# Patient Record
Sex: Female | Born: 1935 | Race: White | Hispanic: No | Marital: Married | Smoking: Never smoker
Health system: Southern US, Community
[De-identification: ages and names within clinical notes are randomized; demographics above are authoritative.]

## PROBLEM LIST (undated history)

## (undated) DIAGNOSIS — Z86010 Personal history of colonic polyps: Secondary | ICD-10-CM

## (undated) DIAGNOSIS — J069 Acute upper respiratory infection, unspecified: Secondary | ICD-10-CM

## (undated) DIAGNOSIS — Z862 Personal history of diseases of the blood and blood-forming organs and certain disorders involving the immune mechanism: Secondary | ICD-10-CM

## (undated) DIAGNOSIS — Z8673 Personal history of transient ischemic attack (TIA), and cerebral infarction without residual deficits: Secondary | ICD-10-CM

## (undated) DIAGNOSIS — K8020 Calculus of gallbladder without cholecystitis without obstruction: Secondary | ICD-10-CM

## (undated) DIAGNOSIS — C180 Malignant neoplasm of cecum: Secondary | ICD-10-CM

## (undated) DIAGNOSIS — H3410 Central retinal artery occlusion, unspecified eye: Secondary | ICD-10-CM

## (undated) DIAGNOSIS — M549 Dorsalgia, unspecified: Secondary | ICD-10-CM

## (undated) DIAGNOSIS — Z8639 Personal history of other endocrine, nutritional and metabolic disease: Secondary | ICD-10-CM

## (undated) DIAGNOSIS — I10 Essential (primary) hypertension: Secondary | ICD-10-CM

## (undated) DIAGNOSIS — D649 Anemia, unspecified: Secondary | ICD-10-CM

## (undated) DIAGNOSIS — Z8719 Personal history of other diseases of the digestive system: Secondary | ICD-10-CM

## (undated) DIAGNOSIS — Z8669 Personal history of other diseases of the nervous system and sense organs: Secondary | ICD-10-CM

## (undated) DIAGNOSIS — H409 Unspecified glaucoma: Secondary | ICD-10-CM

## (undated) DIAGNOSIS — Z85038 Personal history of other malignant neoplasm of large intestine: Secondary | ICD-10-CM

## (undated) DIAGNOSIS — K5730 Diverticulosis of large intestine without perforation or abscess without bleeding: Secondary | ICD-10-CM

## (undated) DIAGNOSIS — I359 Nonrheumatic aortic valve disorder, unspecified: Secondary | ICD-10-CM

## (undated) HISTORY — DX: Central retinal artery occlusion, unspecified eye: H34.10

## (undated) HISTORY — DX: Essential (primary) hypertension: I10

## (undated) HISTORY — DX: Personal history of other diseases of the nervous system and sense organs: Z86.69

## (undated) HISTORY — PX: APPENDECTOMY: SHX54

## (undated) HISTORY — DX: Personal history of colonic polyps: Z86.010

## (undated) HISTORY — DX: Personal history of other endocrine, nutritional and metabolic disease: Z86.39

## (undated) HISTORY — PX: TONSILLECTOMY: SHX5217

## (undated) HISTORY — PX: DILATION AND CURETTAGE OF UTERUS: SHX78

## (undated) HISTORY — PX: CATARACT EXTRACTION: SUR2

## (undated) HISTORY — PX: COLON SURGERY: SHX602

## (undated) HISTORY — DX: Personal history of other malignant neoplasm of large intestine: Z85.038

## (undated) HISTORY — DX: Unspecified glaucoma: H40.9

## (undated) HISTORY — DX: Dorsalgia, unspecified: M54.9

## (undated) HISTORY — DX: Diverticulosis of large intestine without perforation or abscess without bleeding: K57.30

## (undated) HISTORY — DX: Personal history of transient ischemic attack (TIA), and cerebral infarction without residual deficits: Z86.73

## (undated) HISTORY — DX: Personal history of other diseases of the digestive system: Z87.19

## (undated) HISTORY — DX: Anemia, unspecified: D64.9

## (undated) HISTORY — DX: Calculus of gallbladder without cholecystitis without obstruction: K80.20

## (undated) HISTORY — DX: Personal history of diseases of the blood and blood-forming organs and certain disorders involving the immune mechanism: Z86.2

## (undated) HISTORY — DX: Nonrheumatic aortic valve disorder, unspecified: I35.9

## (undated) HISTORY — DX: Acute upper respiratory infection, unspecified: J06.9

## (undated) HISTORY — DX: Malignant neoplasm of cecum: C18.0

---

## 1999-06-11 ENCOUNTER — Emergency Department (HOSPITAL_COMMUNITY): Admission: EM | Admit: 1999-06-11 | Discharge: 1999-06-11 | Payer: Self-pay | Admitting: Emergency Medicine

## 1999-12-22 ENCOUNTER — Other Ambulatory Visit: Admission: RE | Admit: 1999-12-22 | Discharge: 1999-12-22 | Payer: Self-pay | Admitting: Internal Medicine

## 1999-12-22 ENCOUNTER — Encounter (INDEPENDENT_AMBULATORY_CARE_PROVIDER_SITE_OTHER): Payer: Self-pay | Admitting: Specialist

## 2001-04-24 ENCOUNTER — Other Ambulatory Visit: Admission: RE | Admit: 2001-04-24 | Discharge: 2001-04-24 | Payer: Self-pay | Admitting: Internal Medicine

## 2004-04-26 ENCOUNTER — Ambulatory Visit: Payer: Self-pay | Admitting: Internal Medicine

## 2004-07-28 ENCOUNTER — Ambulatory Visit: Payer: Self-pay | Admitting: Internal Medicine

## 2004-11-07 ENCOUNTER — Ambulatory Visit: Payer: Self-pay | Admitting: Internal Medicine

## 2004-11-30 ENCOUNTER — Ambulatory Visit: Payer: Self-pay | Admitting: Internal Medicine

## 2005-01-31 ENCOUNTER — Ambulatory Visit: Payer: Self-pay | Admitting: Internal Medicine

## 2005-08-02 ENCOUNTER — Ambulatory Visit: Payer: Self-pay | Admitting: Internal Medicine

## 2006-01-04 ENCOUNTER — Ambulatory Visit: Payer: Self-pay | Admitting: Internal Medicine

## 2006-02-05 ENCOUNTER — Ambulatory Visit: Payer: Self-pay | Admitting: Internal Medicine

## 2006-07-09 ENCOUNTER — Inpatient Hospital Stay (HOSPITAL_COMMUNITY): Admission: EM | Admit: 2006-07-09 | Discharge: 2006-07-10 | Payer: Self-pay | Admitting: Emergency Medicine

## 2006-07-09 ENCOUNTER — Encounter (INDEPENDENT_AMBULATORY_CARE_PROVIDER_SITE_OTHER): Payer: Self-pay | Admitting: *Deleted

## 2006-07-09 ENCOUNTER — Encounter (INDEPENDENT_AMBULATORY_CARE_PROVIDER_SITE_OTHER): Payer: Self-pay | Admitting: Specialist

## 2006-07-10 ENCOUNTER — Encounter (INDEPENDENT_AMBULATORY_CARE_PROVIDER_SITE_OTHER): Payer: Self-pay | Admitting: *Deleted

## 2006-08-07 ENCOUNTER — Ambulatory Visit: Payer: Self-pay | Admitting: Internal Medicine

## 2006-08-14 ENCOUNTER — Encounter: Payer: Self-pay | Admitting: Internal Medicine

## 2006-08-14 DIAGNOSIS — K5730 Diverticulosis of large intestine without perforation or abscess without bleeding: Secondary | ICD-10-CM | POA: Insufficient documentation

## 2006-08-14 DIAGNOSIS — H3410 Central retinal artery occlusion, unspecified eye: Secondary | ICD-10-CM

## 2006-08-14 DIAGNOSIS — H409 Unspecified glaucoma: Secondary | ICD-10-CM

## 2006-08-14 DIAGNOSIS — Z8669 Personal history of other diseases of the nervous system and sense organs: Secondary | ICD-10-CM

## 2006-08-14 DIAGNOSIS — I10 Essential (primary) hypertension: Secondary | ICD-10-CM

## 2006-08-14 HISTORY — DX: Central retinal artery occlusion, unspecified eye: H34.10

## 2006-08-14 HISTORY — DX: Essential (primary) hypertension: I10

## 2006-08-14 HISTORY — DX: Personal history of other diseases of the nervous system and sense organs: Z86.69

## 2006-08-14 HISTORY — DX: Unspecified glaucoma: H40.9

## 2006-08-14 HISTORY — DX: Diverticulosis of large intestine without perforation or abscess without bleeding: K57.30

## 2006-11-22 ENCOUNTER — Encounter: Payer: Self-pay | Admitting: Internal Medicine

## 2007-01-09 ENCOUNTER — Ambulatory Visit: Payer: Self-pay | Admitting: Internal Medicine

## 2007-02-05 ENCOUNTER — Ambulatory Visit: Payer: Self-pay | Admitting: Internal Medicine

## 2007-04-05 ENCOUNTER — Encounter: Payer: Self-pay | Admitting: Internal Medicine

## 2007-06-27 ENCOUNTER — Ambulatory Visit: Payer: Self-pay | Admitting: Internal Medicine

## 2007-06-27 DIAGNOSIS — M549 Dorsalgia, unspecified: Secondary | ICD-10-CM

## 2007-06-27 HISTORY — DX: Dorsalgia, unspecified: M54.9

## 2007-09-26 ENCOUNTER — Encounter: Payer: Self-pay | Admitting: Internal Medicine

## 2007-10-30 ENCOUNTER — Encounter: Payer: Self-pay | Admitting: Internal Medicine

## 2007-10-31 ENCOUNTER — Ambulatory Visit: Payer: Self-pay | Admitting: Internal Medicine

## 2007-10-31 LAB — CONVERTED CEMR LAB
ALT: 13 units/L (ref 0–35)
AST: 23 units/L (ref 0–37)
Basophils Absolute: 0 10*3/uL (ref 0.0–0.1)
Basophils Relative: 0.2 % (ref 0.0–3.0)
Bilirubin, Direct: 0.1 mg/dL (ref 0.0–0.3)
CO2: 31 meq/L (ref 19–32)
Chloride: 104 meq/L (ref 96–112)
Cholesterol: 171 mg/dL (ref 0–200)
Creatinine, Ser: 0.9 mg/dL (ref 0.4–1.2)
Glucose, Bld: 108 mg/dL — ABNORMAL HIGH (ref 70–99)
LDL Cholesterol: 112 mg/dL — ABNORMAL HIGH (ref 0–99)
Lymphocytes Relative: 22.8 % (ref 12.0–46.0)
MCHC: 34.3 g/dL (ref 30.0–36.0)
Monocytes Relative: 10 % (ref 3.0–12.0)
Neutrophils Relative %: 65.5 % (ref 43.0–77.0)
RBC: 4.81 M/uL (ref 3.87–5.11)
Sodium: 141 meq/L (ref 135–145)
Total Bilirubin: 0.9 mg/dL (ref 0.3–1.2)
Total CHOL/HDL Ratio: 5.7
VLDL: 29 mg/dL (ref 0–40)

## 2007-12-30 ENCOUNTER — Ambulatory Visit: Payer: Self-pay | Admitting: Internal Medicine

## 2008-05-18 ENCOUNTER — Ambulatory Visit: Payer: Self-pay | Admitting: Internal Medicine

## 2008-07-11 ENCOUNTER — Telehealth: Payer: Self-pay | Admitting: Family Medicine

## 2008-07-11 ENCOUNTER — Ambulatory Visit: Payer: Self-pay | Admitting: Family Medicine

## 2008-08-19 ENCOUNTER — Ambulatory Visit: Payer: Self-pay | Admitting: Internal Medicine

## 2008-08-19 DIAGNOSIS — R109 Unspecified abdominal pain: Secondary | ICD-10-CM | POA: Insufficient documentation

## 2008-08-28 ENCOUNTER — Ambulatory Visit: Payer: Self-pay | Admitting: Internal Medicine

## 2008-08-28 ENCOUNTER — Ambulatory Visit (HOSPITAL_COMMUNITY): Admission: RE | Admit: 2008-08-28 | Discharge: 2008-08-28 | Payer: Self-pay | Admitting: Internal Medicine

## 2008-08-28 LAB — CONVERTED CEMR LAB
Basophils Absolute: 0 10*3/uL (ref 0.0–0.1)
Calcium: 9.2 mg/dL (ref 8.4–10.5)
Eosinophils Absolute: 0.2 10*3/uL (ref 0.0–0.7)
GFR calc non Af Amer: 72.65 mL/min (ref >60)
Glucose, Bld: 108 mg/dL — ABNORMAL HIGH (ref 70–99)
HCT: 36.8 % (ref 36.0–46.0)
Lymphs Abs: 2.7 10*3/uL (ref 0.7–4.0)
MCV: 87 fL (ref 78.0–100.0)
Monocytes Absolute: 1 10*3/uL (ref 0.1–1.0)
Platelets: 298 10*3/uL (ref 150.0–400.0)
RDW: 13.1 % (ref 11.5–14.6)
Sodium: 142 meq/L (ref 135–145)

## 2008-08-29 ENCOUNTER — Telehealth: Payer: Self-pay | Admitting: Internal Medicine

## 2008-08-31 ENCOUNTER — Telehealth: Payer: Self-pay | Admitting: Internal Medicine

## 2008-09-07 ENCOUNTER — Ambulatory Visit: Payer: Self-pay | Admitting: Internal Medicine

## 2008-09-07 DIAGNOSIS — J069 Acute upper respiratory infection, unspecified: Secondary | ICD-10-CM

## 2008-09-07 HISTORY — DX: Acute upper respiratory infection, unspecified: J06.9

## 2008-10-06 ENCOUNTER — Encounter: Payer: Self-pay | Admitting: Internal Medicine

## 2008-10-07 ENCOUNTER — Encounter: Admission: RE | Admit: 2008-10-07 | Discharge: 2008-10-07 | Payer: Self-pay | Admitting: Internal Medicine

## 2008-10-08 ENCOUNTER — Telehealth: Payer: Self-pay | Admitting: Internal Medicine

## 2008-10-09 ENCOUNTER — Telehealth: Payer: Self-pay | Admitting: Internal Medicine

## 2008-10-09 DIAGNOSIS — K8020 Calculus of gallbladder without cholecystitis without obstruction: Secondary | ICD-10-CM | POA: Insufficient documentation

## 2008-10-09 HISTORY — DX: Calculus of gallbladder without cholecystitis without obstruction: K80.20

## 2008-10-12 ENCOUNTER — Ambulatory Visit: Payer: Self-pay | Admitting: Internal Medicine

## 2008-10-12 ENCOUNTER — Telehealth (INDEPENDENT_AMBULATORY_CARE_PROVIDER_SITE_OTHER): Payer: Self-pay | Admitting: *Deleted

## 2008-10-12 DIAGNOSIS — Z8719 Personal history of other diseases of the digestive system: Secondary | ICD-10-CM

## 2008-10-12 HISTORY — DX: Personal history of other diseases of the digestive system: Z87.19

## 2008-10-15 ENCOUNTER — Telehealth: Payer: Self-pay | Admitting: Internal Medicine

## 2008-10-15 ENCOUNTER — Telehealth (INDEPENDENT_AMBULATORY_CARE_PROVIDER_SITE_OTHER): Payer: Self-pay

## 2008-10-15 LAB — CONVERTED CEMR LAB
Basophils Absolute: 0 10*3/uL (ref 0.0–0.1)
Hemoglobin: 11.2 g/dL — ABNORMAL LOW (ref 12.0–15.0)
Lymphocytes Relative: 17.6 % (ref 12.0–46.0)
Monocytes Relative: 9.1 % (ref 3.0–12.0)
Neutro Abs: 8.4 10*3/uL — ABNORMAL HIGH (ref 1.4–7.7)
RBC: 3.96 M/uL (ref 3.87–5.11)
RDW: 13 % (ref 11.5–14.6)

## 2008-10-19 ENCOUNTER — Ambulatory Visit: Payer: Self-pay | Admitting: Internal Medicine

## 2008-10-19 LAB — CONVERTED CEMR LAB
Basophils Absolute: 0 10*3/uL (ref 0.0–0.1)
Basophils Relative: 0.3 % (ref 0.0–3.0)
Eosinophils Relative: 0.9 % (ref 0.0–5.0)
HCT: 32.3 % — ABNORMAL LOW (ref 36.0–46.0)
Hemoglobin: 10.7 g/dL — ABNORMAL LOW (ref 12.0–15.0)
Lymphs Abs: 1.7 10*3/uL (ref 0.7–4.0)
Monocytes Relative: 9.6 % (ref 3.0–12.0)
Neutro Abs: 8 10*3/uL — ABNORMAL HIGH (ref 1.4–7.7)
RDW: 13.6 % (ref 11.5–14.6)

## 2008-10-21 ENCOUNTER — Ambulatory Visit: Payer: Self-pay | Admitting: Gastroenterology

## 2008-10-21 DIAGNOSIS — D649 Anemia, unspecified: Secondary | ICD-10-CM

## 2008-10-21 DIAGNOSIS — K921 Melena: Secondary | ICD-10-CM

## 2008-10-21 DIAGNOSIS — R1013 Epigastric pain: Secondary | ICD-10-CM

## 2008-10-21 DIAGNOSIS — Z860100 Personal history of colon polyps, unspecified: Secondary | ICD-10-CM | POA: Insufficient documentation

## 2008-10-21 HISTORY — DX: Personal history of colonic polyps: Z86.010

## 2008-10-21 HISTORY — DX: Personal history of colon polyps, unspecified: Z86.0100

## 2008-10-21 HISTORY — DX: Anemia, unspecified: D64.9

## 2008-10-23 LAB — CONVERTED CEMR LAB
BUN: 14 mg/dL (ref 6–23)
CO2: 31 meq/L (ref 19–32)
Creatinine, Ser: 0.7 mg/dL (ref 0.4–1.2)
Eosinophils Relative: 1.2 % (ref 0.0–5.0)
GFR calc non Af Amer: 84.72 mL/min (ref >60)
Glucose, Bld: 100 mg/dL — ABNORMAL HIGH (ref 70–99)
HCT: 32 % — ABNORMAL LOW (ref 36.0–46.0)
Hemoglobin: 10.8 g/dL — ABNORMAL LOW (ref 12.0–15.0)
Lipase: 13 units/L (ref 11.0–59.0)
Lymphocytes Relative: 17 % (ref 12.0–46.0)
Lymphs Abs: 2.1 10*3/uL (ref 0.7–4.0)
Monocytes Relative: 9.1 % (ref 3.0–12.0)
Neutro Abs: 8.7 10*3/uL — ABNORMAL HIGH (ref 1.4–7.7)
RBC: 3.85 M/uL — ABNORMAL LOW (ref 3.87–5.11)
Total Bilirubin: 0.4 mg/dL (ref 0.3–1.2)
WBC: 12.1 10*3/uL — ABNORMAL HIGH (ref 4.5–10.5)

## 2008-10-27 ENCOUNTER — Encounter: Payer: Self-pay | Admitting: Internal Medicine

## 2008-10-27 ENCOUNTER — Ambulatory Visit (HOSPITAL_COMMUNITY): Admission: RE | Admit: 2008-10-27 | Discharge: 2008-10-27 | Payer: Self-pay | Admitting: Internal Medicine

## 2008-10-27 ENCOUNTER — Telehealth: Payer: Self-pay | Admitting: Internal Medicine

## 2008-10-28 ENCOUNTER — Ambulatory Visit: Payer: Self-pay | Admitting: Internal Medicine

## 2008-10-30 ENCOUNTER — Encounter: Payer: Self-pay | Admitting: Internal Medicine

## 2008-10-30 ENCOUNTER — Telehealth: Payer: Self-pay | Admitting: Internal Medicine

## 2008-10-30 ENCOUNTER — Ambulatory Visit: Payer: Self-pay | Admitting: Internal Medicine

## 2008-10-30 ENCOUNTER — Ambulatory Visit (HOSPITAL_COMMUNITY): Admission: RE | Admit: 2008-10-30 | Discharge: 2008-10-30 | Payer: Self-pay | Admitting: Internal Medicine

## 2008-10-30 DIAGNOSIS — C180 Malignant neoplasm of cecum: Secondary | ICD-10-CM

## 2008-10-30 HISTORY — DX: Malignant neoplasm of cecum: C18.0

## 2008-11-02 ENCOUNTER — Ambulatory Visit (HOSPITAL_COMMUNITY): Admission: RE | Admit: 2008-11-02 | Discharge: 2008-11-02 | Payer: Self-pay | Admitting: Internal Medicine

## 2008-11-02 ENCOUNTER — Encounter: Payer: Self-pay | Admitting: Internal Medicine

## 2008-11-02 ENCOUNTER — Telehealth: Payer: Self-pay | Admitting: Internal Medicine

## 2008-11-03 ENCOUNTER — Ambulatory Visit: Payer: Self-pay | Admitting: Internal Medicine

## 2008-11-10 ENCOUNTER — Encounter: Payer: Self-pay | Admitting: Internal Medicine

## 2008-11-20 ENCOUNTER — Encounter (INDEPENDENT_AMBULATORY_CARE_PROVIDER_SITE_OTHER): Payer: Self-pay | Admitting: Surgery

## 2008-11-20 ENCOUNTER — Inpatient Hospital Stay (HOSPITAL_COMMUNITY): Admission: RE | Admit: 2008-11-20 | Discharge: 2008-11-26 | Payer: Self-pay | Admitting: Surgery

## 2008-12-04 ENCOUNTER — Ambulatory Visit: Payer: Self-pay | Admitting: Oncology

## 2008-12-09 ENCOUNTER — Encounter: Payer: Self-pay | Admitting: Internal Medicine

## 2008-12-09 LAB — CBC WITH DIFFERENTIAL/PLATELET
BASO%: 0.4 % (ref 0.0–2.0)
EOS%: 1 % (ref 0.0–7.0)
HCT: 30.5 % — ABNORMAL LOW (ref 34.8–46.6)
LYMPH%: 23.2 % (ref 14.0–49.7)
MCH: 26 pg (ref 25.1–34.0)
MCHC: 33 g/dL (ref 31.5–36.0)
MCV: 78.8 fL — ABNORMAL LOW (ref 79.5–101.0)
MONO%: 8.6 % (ref 0.0–14.0)
NEUT%: 66.8 % (ref 38.4–76.8)
Platelets: 355 10*3/uL (ref 145–400)

## 2008-12-09 LAB — COMPREHENSIVE METABOLIC PANEL
ALT: 15 U/L (ref 0–35)
AST: 23 U/L (ref 0–37)
BUN: 15 mg/dL (ref 6–23)
Creatinine, Ser: 0.64 mg/dL (ref 0.40–1.20)
Total Bilirubin: 0.2 mg/dL — ABNORMAL LOW (ref 0.3–1.2)

## 2008-12-22 ENCOUNTER — Inpatient Hospital Stay (HOSPITAL_COMMUNITY): Admission: EM | Admit: 2008-12-22 | Discharge: 2008-12-27 | Payer: Self-pay | Admitting: Emergency Medicine

## 2008-12-22 ENCOUNTER — Telehealth: Payer: Self-pay | Admitting: Internal Medicine

## 2008-12-22 ENCOUNTER — Encounter (INDEPENDENT_AMBULATORY_CARE_PROVIDER_SITE_OTHER): Payer: Self-pay | Admitting: *Deleted

## 2008-12-23 ENCOUNTER — Encounter (INDEPENDENT_AMBULATORY_CARE_PROVIDER_SITE_OTHER): Payer: Self-pay | Admitting: *Deleted

## 2008-12-24 ENCOUNTER — Encounter (INDEPENDENT_AMBULATORY_CARE_PROVIDER_SITE_OTHER): Payer: Self-pay | Admitting: *Deleted

## 2009-01-04 ENCOUNTER — Ambulatory Visit: Payer: Self-pay | Admitting: Internal Medicine

## 2009-01-04 DIAGNOSIS — T887XXA Unspecified adverse effect of drug or medicament, initial encounter: Secondary | ICD-10-CM

## 2009-03-08 ENCOUNTER — Ambulatory Visit: Payer: Self-pay | Admitting: Oncology

## 2009-03-10 ENCOUNTER — Encounter: Payer: Self-pay | Admitting: Internal Medicine

## 2009-03-10 LAB — CBC WITH DIFFERENTIAL/PLATELET
BASO%: 0.3 % (ref 0.0–2.0)
LYMPH%: 23.4 % (ref 14.0–49.7)
MCHC: 33.1 g/dL (ref 31.5–36.0)
MONO#: 0.8 10*3/uL (ref 0.1–0.9)
Platelets: 263 10*3/uL (ref 145–400)
RBC: 4.59 10*6/uL (ref 3.70–5.45)
WBC: 7.3 10*3/uL (ref 3.9–10.3)

## 2009-03-10 LAB — COMPREHENSIVE METABOLIC PANEL
ALT: 10 U/L (ref 0–35)
AST: 17 U/L (ref 0–37)
Alkaline Phosphatase: 63 U/L (ref 39–117)
CO2: 27 mEq/L (ref 19–32)
Sodium: 137 mEq/L (ref 135–145)
Total Bilirubin: 0.3 mg/dL (ref 0.3–1.2)
Total Protein: 6.9 g/dL (ref 6.0–8.3)

## 2009-03-10 LAB — CEA: CEA: 3.2 ng/mL (ref 0.0–5.0)

## 2009-03-27 HISTORY — PX: ANKLE FRACTURE SURGERY: SHX122

## 2009-03-31 ENCOUNTER — Ambulatory Visit: Payer: Self-pay | Admitting: Internal Medicine

## 2009-05-07 ENCOUNTER — Encounter: Payer: Self-pay | Admitting: Internal Medicine

## 2009-05-08 ENCOUNTER — Emergency Department (HOSPITAL_COMMUNITY): Admission: EM | Admit: 2009-05-08 | Discharge: 2009-05-08 | Payer: Self-pay | Admitting: Family Medicine

## 2009-06-10 ENCOUNTER — Ambulatory Visit: Payer: Self-pay | Admitting: Oncology

## 2009-06-14 ENCOUNTER — Ambulatory Visit (HOSPITAL_COMMUNITY): Admission: RE | Admit: 2009-06-14 | Discharge: 2009-06-14 | Payer: Self-pay | Admitting: Oncology

## 2009-06-14 ENCOUNTER — Encounter (INDEPENDENT_AMBULATORY_CARE_PROVIDER_SITE_OTHER): Payer: Self-pay | Admitting: *Deleted

## 2009-06-14 LAB — COMPREHENSIVE METABOLIC PANEL
ALT: 13 U/L (ref 0–35)
Alkaline Phosphatase: 66 U/L (ref 39–117)
CO2: 32 mEq/L (ref 19–32)
Sodium: 139 mEq/L (ref 135–145)
Total Bilirubin: 0.6 mg/dL (ref 0.3–1.2)
Total Protein: 7.8 g/dL (ref 6.0–8.3)

## 2009-06-14 LAB — CBC WITH DIFFERENTIAL/PLATELET
BASO%: 0.4 % (ref 0.0–2.0)
LYMPH%: 20.2 % (ref 14.0–49.7)
MCHC: 34 g/dL (ref 31.5–36.0)
MONO#: 0.8 10*3/uL (ref 0.1–0.9)
Platelets: 259 10*3/uL (ref 145–400)
RBC: 4.73 10*6/uL (ref 3.70–5.45)
RDW: 15.1 % — ABNORMAL HIGH (ref 11.2–14.5)
WBC: 10.5 10*3/uL — ABNORMAL HIGH (ref 3.9–10.3)

## 2009-06-14 LAB — CEA: CEA: 4.5 ng/mL (ref 0.0–5.0)

## 2009-06-18 ENCOUNTER — Encounter: Payer: Self-pay | Admitting: Internal Medicine

## 2009-06-23 ENCOUNTER — Encounter: Payer: Self-pay | Admitting: Internal Medicine

## 2009-06-25 ENCOUNTER — Ambulatory Visit (HOSPITAL_COMMUNITY): Admission: RE | Admit: 2009-06-25 | Discharge: 2009-06-25 | Payer: Self-pay | Admitting: Oncology

## 2009-06-25 ENCOUNTER — Encounter (INDEPENDENT_AMBULATORY_CARE_PROVIDER_SITE_OTHER): Payer: Self-pay | Admitting: *Deleted

## 2009-07-28 ENCOUNTER — Ambulatory Visit: Payer: Self-pay | Admitting: Internal Medicine

## 2009-09-20 ENCOUNTER — Ambulatory Visit: Payer: Self-pay | Admitting: Oncology

## 2009-09-21 ENCOUNTER — Encounter: Payer: Self-pay | Admitting: Internal Medicine

## 2009-09-21 LAB — CBC WITH DIFFERENTIAL/PLATELET
Basophils Absolute: 0 10*3/uL (ref 0.0–0.1)
Eosinophils Absolute: 0.2 10*3/uL (ref 0.0–0.5)
HCT: 39.5 % (ref 34.8–46.6)
HGB: 13.4 g/dL (ref 11.6–15.9)
MONO#: 0.9 10*3/uL (ref 0.1–0.9)
NEUT#: 5.6 10*3/uL (ref 1.5–6.5)
NEUT%: 69.2 % (ref 38.4–76.8)
RDW: 14.4 % (ref 11.2–14.5)
lymph#: 1.4 10*3/uL (ref 0.9–3.3)

## 2009-09-21 LAB — COMPREHENSIVE METABOLIC PANEL
AST: 20 U/L (ref 0–37)
Albumin: 3.9 g/dL (ref 3.5–5.2)
BUN: 18 mg/dL (ref 6–23)
CO2: 29 mEq/L (ref 19–32)
Calcium: 9.2 mg/dL (ref 8.4–10.5)
Chloride: 101 mEq/L (ref 96–112)
Glucose, Bld: 98 mg/dL (ref 70–99)
Potassium: 4 mEq/L (ref 3.5–5.3)

## 2009-09-21 LAB — CEA: CEA: 3.7 ng/mL (ref 0.0–5.0)

## 2009-09-21 LAB — LACTATE DEHYDROGENASE: LDH: 94 U/L (ref 94–250)

## 2009-11-07 ENCOUNTER — Inpatient Hospital Stay (HOSPITAL_COMMUNITY): Admission: EM | Admit: 2009-11-07 | Discharge: 2009-11-12 | Payer: Self-pay | Admitting: Emergency Medicine

## 2009-11-09 ENCOUNTER — Encounter (INDEPENDENT_AMBULATORY_CARE_PROVIDER_SITE_OTHER): Payer: Self-pay | Admitting: Family Medicine

## 2009-11-11 ENCOUNTER — Ambulatory Visit: Payer: Self-pay | Admitting: Vascular Surgery

## 2009-11-11 ENCOUNTER — Encounter (INDEPENDENT_AMBULATORY_CARE_PROVIDER_SITE_OTHER): Payer: Self-pay | Admitting: Internal Medicine

## 2009-11-15 ENCOUNTER — Telehealth: Payer: Self-pay | Admitting: Internal Medicine

## 2009-11-24 ENCOUNTER — Telehealth (INDEPENDENT_AMBULATORY_CARE_PROVIDER_SITE_OTHER): Payer: Self-pay | Admitting: *Deleted

## 2009-11-25 ENCOUNTER — Telehealth: Payer: Self-pay | Admitting: Internal Medicine

## 2009-11-25 ENCOUNTER — Ambulatory Visit: Payer: Self-pay | Admitting: Internal Medicine

## 2009-11-26 ENCOUNTER — Ambulatory Visit: Payer: Self-pay | Admitting: Internal Medicine

## 2009-11-26 DIAGNOSIS — Z862 Personal history of diseases of the blood and blood-forming organs and certain disorders involving the immune mechanism: Secondary | ICD-10-CM

## 2009-11-26 DIAGNOSIS — I359 Nonrheumatic aortic valve disorder, unspecified: Secondary | ICD-10-CM

## 2009-11-26 DIAGNOSIS — Z8639 Personal history of other endocrine, nutritional and metabolic disease: Secondary | ICD-10-CM

## 2009-11-26 HISTORY — DX: Nonrheumatic aortic valve disorder, unspecified: I35.9

## 2009-11-26 HISTORY — DX: Personal history of diseases of the blood and blood-forming organs and certain disorders involving the immune mechanism: Z86.2

## 2009-11-26 HISTORY — DX: Personal history of diseases of the blood and blood-forming organs and certain disorders involving the immune mechanism: Z86.39

## 2009-12-06 ENCOUNTER — Encounter: Payer: Self-pay | Admitting: Internal Medicine

## 2009-12-09 ENCOUNTER — Telehealth: Payer: Self-pay | Admitting: Internal Medicine

## 2009-12-16 ENCOUNTER — Ambulatory Visit: Payer: Self-pay | Admitting: Oncology

## 2009-12-20 ENCOUNTER — Encounter (INDEPENDENT_AMBULATORY_CARE_PROVIDER_SITE_OTHER): Payer: Self-pay | Admitting: *Deleted

## 2009-12-20 ENCOUNTER — Ambulatory Visit (HOSPITAL_COMMUNITY): Admission: RE | Admit: 2009-12-20 | Discharge: 2009-12-20 | Payer: Self-pay | Admitting: Oncology

## 2009-12-20 LAB — CBC WITH DIFFERENTIAL/PLATELET
BASO%: 0.3 % (ref 0.0–2.0)
Eosinophils Absolute: 0.1 10*3/uL (ref 0.0–0.5)
HCT: 40 % (ref 34.8–46.6)
MCHC: 33.9 g/dL (ref 31.5–36.0)
MONO#: 0.5 10*3/uL (ref 0.1–0.9)
NEUT#: 7 10*3/uL — ABNORMAL HIGH (ref 1.5–6.5)
Platelets: 269 10*3/uL (ref 145–400)
RBC: 4.56 10*6/uL (ref 3.70–5.45)
WBC: 9.3 10*3/uL (ref 3.9–10.3)
lymph#: 1.7 10*3/uL (ref 0.9–3.3)

## 2009-12-20 LAB — CEA: CEA: 3.2 ng/mL (ref 0.0–5.0)

## 2009-12-22 ENCOUNTER — Encounter: Payer: Self-pay | Admitting: Internal Medicine

## 2010-01-06 ENCOUNTER — Telehealth: Payer: Self-pay | Admitting: Internal Medicine

## 2010-01-21 ENCOUNTER — Ambulatory Visit: Payer: Self-pay | Admitting: Internal Medicine

## 2010-01-25 ENCOUNTER — Encounter: Payer: Self-pay | Admitting: Internal Medicine

## 2010-01-31 ENCOUNTER — Encounter: Payer: Self-pay | Admitting: Internal Medicine

## 2010-02-16 ENCOUNTER — Telehealth: Payer: Self-pay | Admitting: Internal Medicine

## 2010-02-25 DIAGNOSIS — Z85038 Personal history of other malignant neoplasm of large intestine: Secondary | ICD-10-CM

## 2010-02-25 DIAGNOSIS — K56609 Unspecified intestinal obstruction, unspecified as to partial versus complete obstruction: Secondary | ICD-10-CM | POA: Insufficient documentation

## 2010-02-25 HISTORY — DX: Personal history of other malignant neoplasm of large intestine: Z85.038

## 2010-03-03 ENCOUNTER — Ambulatory Visit: Payer: Self-pay | Admitting: Internal Medicine

## 2010-03-30 ENCOUNTER — Ambulatory Visit
Admission: RE | Admit: 2010-03-30 | Discharge: 2010-03-30 | Payer: Self-pay | Source: Home / Self Care | Attending: Internal Medicine | Admitting: Internal Medicine

## 2010-03-30 ENCOUNTER — Encounter: Payer: Self-pay | Admitting: Internal Medicine

## 2010-03-30 LAB — HM COLONOSCOPY

## 2010-04-01 ENCOUNTER — Ambulatory Visit
Admission: RE | Admit: 2010-04-01 | Discharge: 2010-04-01 | Payer: Self-pay | Source: Home / Self Care | Attending: Internal Medicine | Admitting: Internal Medicine

## 2010-04-02 ENCOUNTER — Encounter: Payer: Self-pay | Admitting: Internal Medicine

## 2010-04-17 ENCOUNTER — Encounter: Payer: Self-pay | Admitting: Oncology

## 2010-04-26 NOTE — Progress Notes (Signed)
Summary: Verbal Order  Phone Note Other Incoming Call back at [phone]   Caller: [facility]  Summary of Call: Needs verbal order to treat patient for an additional week before discharging. Initial call taken by: Trixie Dredge,  January 06, 2010 9:21 AM  Follow-up for Phone Call        okay Follow-up by: Gordy Savers  MD,  January 06, 2010 12:27 PM  Additional Follow-up for Phone Call Additional follow up Details #1::        spoke with Trey Paula - okay'd ordered. KIK Additional Follow-up by: Duard Brady LPN,  January 06, 2010 12:50 PM

## 2010-04-26 NOTE — Miscellaneous (Signed)
Summary: Face to Face Visit/[facility]  Face to Face Visit/[facility]   Imported By: Maryln Gottron 12/10/2009 15:10:31  _____________________________________________________________________  External Attachment:    Type:   Image     Comment:   External Document

## 2010-04-26 NOTE — Letter (Signed)
[facility]   Imported By: Maryln Gottron 10/05/2009 15:35:47  _____________________________________________________________________  External Attachment:    Type:   Image     Comment:   External Document

## 2010-04-26 NOTE — Letter (Signed)
Summary: [facility]   Imported By: Sherian Rein 02/23/2010 10:15:10  _____________________________________________________________________  External Attachment:    Type:   Image     Comment:   External Document

## 2010-04-26 NOTE — Letter (Signed)
Summary: [facility]   Imported By: Maryln Gottron 07/09/2009 13:55:40  _____________________________________________________________________  External Attachment:    Type:   Image     Comment:   External Document

## 2010-04-26 NOTE — Assessment & Plan Note (Signed)
Summary: cough/dm   Vital Signs:  Patient profile:   77 year old female Weight:      154 pounds O2 Sat:      98 % on Room air Temp:     97.4 degrees F oral Pulse rate:   70 / minute Pulse rhythm:   regular BP sitting:   148 / 80  (left arm) Cuff size:   regular  Vitals Entered By: Raechel Ache, RN (March 31, 2009 10:27 AM)  O2 Flow:  Room air CC: C/o dry cough. Is Patient Diabetic? No   CC:  C/o dry cough..  History of Present Illness: an 77 year old patient who presents with a several-day history of URI symptoms.  She has a nonproductive cough.  No fever, chest pain or shortness of breath.  Denies any wheezing.  She has treated hypertension, which has been stable  Allergies: 1)  ! Cipro (Ciprofloxacin Hcl) 2)  ! Robaxin (Methocarbamol) 3)  Amoxicillin (Amoxicillin)  Past History:  Past Medical History: Reviewed history from 11/03/2008 and no changes required. 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 No apparent alopecia or balding. Eyes:  No corneal or conjunctival inflammation noted. EOMI. Perrla. Funduscopic exam benign, without hemorrhages, exudates or papilledema. Vision grossly normal. Ears:  External ear exam shows no significant lesions or deformities.  Otoscopic examination reveals clear canals, tympanic membranes are intact bilaterally without bulging, retraction, inflammation or discharge. Hearing is grossly normal bilaterally. Mouth:  Oral mucosa and oropharynx without lesions or exudates.  Teeth in good repair. Neck:  No deformities, masses, or tenderness noted. Lungs:  Normal respiratory effort, chest expands symmetrically. Lungs are clear to  auscultation, no crackles or wheezes. Heart:  Normal rate and regular rhythm. S1 and S2 normal without gallop, murmur, click, rub or other extra sounds.   Impression & Recommendations:  Problem # 1:  URI (ICD-465.9)  Her updated medication list for this problem includes:    Aspirin 81 Mg Tbec (Aspirin) .Marland Kitchen... Take 1 tablet by mouth once a day    Hydrocodone-homatropine 5-1.5 Mg/48ml Syrp (Hydrocodone-homatropine) .Marland Kitchen... 1 teaspoon every 6 hours as needed for cough  Problem # 2:  HYPERTENSION (ICD-401.9)  Her updated medication list for this problem includes:    Atenolol 50 Mg Tabs (Atenolol) .Marland Kitchen... Take 1 tablet by mouth once a day    Hydrochlorothiazide 25 Mg Tabs (Hydrochlorothiazide) .Marland Kitchen... 1 tablet by mouth every morning  Complete Medication List: 1)  Alphagan P 0.15 % Soln (Brimonidine tartrate) 2)  Aspirin 81 Mg Tbec (Aspirin) .... Take 1 tablet by mouth once a day 3)  Atenolol 50 Mg Tabs (Atenolol) .... Take 1 tablet by mouth once a day 4)  Hydrochlorothiazide 25 Mg Tabs (Hydrochlorothiazide) .Marland Kitchen.. 1 tablet by mouth every morning 5)  Xalatan 0.005 % Soln (Latanoprost) 6)  Calcium 600 600 Mg Tabs (Calcium carbonate) .... Take 1 daily 7)  Hydrocodone-homatropine 5-1.5 Mg/48ml Syrp (Hydrocodone-homatropine) .Marland Kitchen.. 1 teaspoon every 6 hours as needed for cough  Patient Instructions: 1)  Get plenty of rest, drink lots of clear liquids, and use Tylenol or Ibuprofen for fever and comfort. Return in 7-10 days if you're not better:sooner if you're feeling worse. Prescriptions: HYDROCODONE-HOMATROPINE 5-1.5 MG/5ML SYRP (HYDROCODONE-HOMATROPINE) 1 teaspoon every 6 hours as needed for cough  #8 oz x  2   Entered and Authorized by:   Gordy Savers  MD   Signed by:   Gordy Savers  MD on 03/31/2009   Method used:   Print then Give to Patient   RxID:   [id number]

## 2010-04-26 NOTE — Progress Notes (Signed)
  Phone Note Call from Patient   Call For: Karen Savers  MD Summary of Call: Pt discharged from home health nursing due to cancellations by pt. Initial call taken by: Lynann Beaver CMA,  December 09, 2009 12:43 PM  Follow-up for Phone Call        noted Follow-up by: Karen Savers  MD,  December 09, 2009 1:12 PM

## 2010-04-26 NOTE — Assessment & Plan Note (Signed)
Summary: POST HOSP F/U (INJURY FROM FALL - FOOT FX) // RS/daughter res...   Vital Signs:  Patient profile:   77 year old female Temp:     97.7 degrees F oral BP sitting:   120 / 72  (right arm) Cuff size:   regular  Vitals Entered By: Duard Brady LPN (November 26, 2009 1:16 PM) CC: post hospital - (R) ankle fx Is Patient Diabetic? No   CC:  post hospital - (R) ankle fx.  History of Present Illness: 77 year old patient who is seen today following a hospital discharge following ORIF of a right ankle fracture.  She has done quite well and does receive home physical therapy and assistance.  She does have family that lives close by.  She does have a husband, but he is quite disabled with impaired memory.  She has treated hypertension, which has been stable.  Potassium was added to her regimen due to hypokalemia noted during the hospital.  Since her discharge she has done well.  She is followed closely.  Orthopedics and is still nonweightbearing. Hospital records reviewed.  This included a 2-D echocardiogram that revealed aortic insufficiency.  Allergies: 1)  ! Cipro (Ciprofloxacin Hcl) 2)  ! Robaxin (Methocarbamol) 3)  Amoxicillin (Amoxicillin)  Past History:  Past Medical History: Reviewed history from 11/03/2008 and no changes required. 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 Family History Lung cancer Family History of Stroke F 1st degree relative  <60 Family History of Cardiovascular disorder  father died age 45 MI mother died age 17, CVA  One brother died lung cancer 4 sisters; one died age 83, cerebral hemorrhage; one died age 55, senile dementia  Social History: Reviewed history from 06/27/2007 and no changes required. Retired Married  Review of Systems       The patient complains of muscle weakness and difficulty walking.  The patient denies anorexia, fever, weight loss, weight gain, vision loss, decreased hearing, hoarseness, chest pain, syncope, dyspnea on exertion, peripheral edema, prolonged cough, headaches, hemoptysis, abdominal pain, melena, hematochezia, severe indigestion/heartburn, hematuria, incontinence, genital sores, suspicious skin lesions, transient blindness, depression, unusual weight change, abnormal bleeding, enlarged lymph nodes, angioedema, and breast masses.    Physical Exam  General:  Well-developed,well-nourished,in no acute distress; alert,appropriate and cooperative throughout examination; 110/70 Head:  Normocephalic and atraumatic without obvious abnormalities. No apparent alopecia or balding. Eyes:  No corneal or conjunctival inflammation noted. EOMI. Perrla. Funduscopic exam benign, without hemorrhages, exudates or papilledema. Vision grossly normal. Mouth:  Oral mucosa and oropharynx without lesions or exudates.  Teeth in good repair. Neck:  No deformities, masses, or tenderness noted. Lungs:  Normal respiratory effort, chest expands symmetrically. Lungs are clear to auscultation, no crackles or wheezes.  O2 saturation 97 Heart:  Normal rate and regular rhythm. S1 and S2 normal without gallop, murmur, click, rub or other extra sounds. Abdomen:  Bowel sounds positive,abdomen soft and non-tender without masses, organomegaly or hernias noted. Msk:  No deformity or scoliosis noted of thoracic or lumbar spine.   Pulses:  R and L carotid,radial,femoral,dorsalis pedis and posterior tibial pulses  are  full and equal bilaterally Extremities:  right lower leg casted.  No edema   Impression & Recommendations:  Problem # 1:  HYPERTENSION (ICD-401.9)  Her updated medication list for this problem includes:    Atenolol 50 Mg Tabs (Atenolol) .Marland Kitchen... Take 1 tablet by mouth once a day    Hydrochlorothiazide 25 Mg Tabs (Hydrochlorothiazide) .Marland Kitchen... 1 tablet by mouth every morning  Problem # 2:  HYPOKALEMIA, HX OF (ICD-V12.2)  Problem # 3:  AORTIC INSUFFICIENCY (ICD-424.1)  Her updated medication list for this problem includes:    Aspirin 81 Mg Tbec (Aspirin) .Marland Kitchen... Take 1 tablet by mouth once a day    Atenolol 50 Mg Tabs (Atenolol) .Marland Kitchen... Take 1 tablet by mouth once a day  Her updated medication list for this problem includes:    Aspirin 81 Mg Tbec (Aspirin) .Marland Kitchen... Take 1 tablet by mouth once a day    Atenolol 50 Mg Tabs (Atenolol) .Marland Kitchen... Take 1 tablet by mouth once a day  Complete Medication List: 1)  Alphagan P 0.15 % Soln (Brimonidine tartrate) 2)  Aspirin 81 Mg Tbec (Aspirin) .... Take 1 tablet by mouth once a day 3)  Atenolol 50 Mg Tabs (Atenolol) .... Take 1 tablet by mouth once a day 4)  Hydrochlorothiazide 25 Mg Tabs (Hydrochlorothiazide) .Marland Kitchen.. 1 tablet by mouth every morning 5)  Xalatan 0.005 % Soln (Latanoprost) 6)  Calcium 600 600 Mg Tabs (Calcium carbonate) .... Take 1 daily 7)  Klor-con M20 20 Meq Cr-tabs (Potassium chloride crys cr) .... Qd  Patient Instructions: 1)  Please schedule a follow-up appointment in 4 months. 2)  Limit your Sodium (Salt). 3)  Schedule your mammogram. 4)  Take an Aspirin every day. 5)  Take calcium +Vitamin D daily. Prescriptions: KLOR-CON M20 20 MEQ CR-TABS (POTASSIUM CHLORIDE CRYS CR) qd  #90 x 6   Entered and Authorized by:   Gordy Savers  MD   Signed by:   Gordy Savers  MD on 11/26/2009   Method used:   Electronically to        [facility] [address] [phone]* (retail)       [address]       [place], [place]  [address]       Ph: [phone] or [phone]       Fax: [phone]   RxID:   [id number] HYDROCHLOROTHIAZIDE 25 MG TABS (HYDROCHLOROTHIAZIDE) 1 tablet by mouth every morning  #90 x 6   Entered and Authorized by:   Gordy Savers  MD   Signed by:   Gordy Savers  MD on 11/26/2009   Method used:   [facility] to        [facility]  [address] [phone]* (retail)       [address]       [place], [place]  [address]       Ph: [phone] or [phone]       Fax: [phone]   RxID:   [id number] ATENOLOL 50 MG TABS (ATENOLOL) Take 1 tablet by mouth once a day  #100 Tablet x 6   Entered and Authorized by:   Gordy Savers  MD   Signed by:   Gordy Savers  MD on 11/26/2009   Method used:   [facility]        [facility]  [address] [phone]* (retail)  [address]       [place], [place]  [address]       Ph: [phone] or [phone]       Fax: [phone]   RxID:   [id number]

## 2010-04-26 NOTE — Progress Notes (Signed)
Summary: [facility] called re: status of face to face form  Phone Note From Other Clinic   Caller: [facility]         - Amy Summary of Call: Amy with [facility] called and wants to know the status of the face to face form that was faxed on 11/11/09.  Please complete asap and fax back to [phone]. Initial call taken by: Lucy Antigua,  November 25, 2009 4:34 PM  Follow-up for Phone Call        pt has appt tomorrow - have you seen this ppwk? Follow-up by: Duard Brady LPN,  November 25, 2009 4:41 PM  Additional Follow-up for Phone Call Additional follow up Details #1::        May have been discarded or returned  since I have not seen this patient since 07-28-09 and there has been no "face-to-face" Additional Follow-up by: Gordy Savers  MD,  November 25, 2009 5:02 PM    Additional Follow-up for Phone Call Additional follow up Details #2::    attempt to call - only got fax # when call was taken .  Follow-up by: Duard Brady LPN,  November 26, 2009 10:35 AM

## 2010-04-26 NOTE — Letter (Signed)
[facility]   Imported By: Lennie Odor 07/07/2009 16:53:46  _____________________________________________________________________  External Attachment:    Type:   Image     Comment:   External Document

## 2010-04-26 NOTE — Letter (Signed)
[facility]   Imported By: Georgian Co 01/25/2010 09:59:47  _____________________________________________________________________  External Attachment:    Type:   Image     Comment:   External Document

## 2010-04-26 NOTE — Progress Notes (Signed)
Summary: blisters on foot  Phone Note From Other Clinic   Caller: [facility]home health Summary of Call: is seeing pt in the home for f/u on fx ankle (r) , noted some fluid filled blisters to top of foot. Please advise if dressing needed.  [phone] Initial call taken by: Duard Brady LPN,  November 15, 2009 2:35 PM  Follow-up for Phone Call        OK to wrap after blister ruptures and clean daily Follow-up by: Gordy Savers  MD,  November 15, 2009 5:25 PM  Additional Follow-up for Phone Call Additional follow up Details #1::        attempt to call- ans mach - left order per Dr. Frederica Kuster clean and wrap only if blisters rupture. KIk Additional Follow-up by: Duard Brady LPN,  November 15, 2009 5:29 PM

## 2010-04-26 NOTE — Miscellaneous (Signed)
Summary: Certification and Plan of Treatment/[facility]  Certification and Plan of Treatment/[facility]   Imported By: Maryln Gottron 12/15/2009 15:12:18  _____________________________________________________________________  External Attachment:    Type:   Image     Comment:   External Document

## 2010-04-26 NOTE — Letter (Signed)
Summary: [facility] Instructions  [facility]  [address] [place], [place] [address]   Phone: [phone]  Fax: [phone]       Karen [name]    1923-02-01    MRN: [id number]       Procedure Day Dorna Bloom: Wednesdsay 03/30/10     Arrival Time: 3:00 pm     Procedure Time: 4:00 pm     Location of Procedure:                    _x_  [facility] (4th Floor)  PREPARATION FOR COLONOSCOPY WITH MIRALAX  Starting 5 days prior to your procedure 03/25/10 do not eat nuts, seeds, popcorn, corn, beans, peas,  salads, or any raw vegetables.  Do not take any fiber supplements (e.g. Metamucil, Citrucel, and Benefiber). ____________________________________________________________________________________________________   THE DAY BEFORE YOUR PROCEDURE         DATE: 03/29/10 DAY: Tuesday  1   Drink clear liquids the entire day-NO SOLID FOOD  2   Do not drink anything colored red or purple.  Avoid juices with pulp.  No orange juice.  3   Drink at least 64 oz. (8 glasses) of fluid/clear liquids during the day to prevent dehydration and help the prep work efficiently.  CLEAR LIQUIDS INCLUDE: Water Jello Ice Popsicles Tea (sugar ok, no milk/cream) Powdered fruit flavored drinks Coffee (sugar ok, no milk/cream) Gatorade Juice: apple, white grape, white cranberry  Lemonade Clear bullion, consomm, broth Carbonated beverages (any kind) Strained chicken noodle soup Hard Candy  4   Mix the entire bottle of Miralax with 64 oz. of Gatorade/Powerade in the morning and put in the refrigerator to chill.  5   At 3:00 pm take 2 Dulcolax/Bisacodyl tablets.  6   At 4:30 pm take one Reglan/Metoclopramide tablet.  7  Starting at 5:00 pm drink one 8 oz glass of the Miralax mixture every 15-20 minutes until you have finished drinking the entire 64 oz.  You should finish drinking prep around 7:30 or 8:00 pm.  8   If you are nauseated, you may take the 2nd Reglan/Metoclopramide tablet  at 6:30 pm.        9    At 8:00 pm take 2 more DULCOLAX/Bisacodyl tablets.          THE DAY OF YOUR PROCEDURE      DATE:  03/30/10 DAY: Wednesday  You may drink clear liquids until 2:00 pm  (2 HOURS BEFORE PROCEDURE).   MEDICATION INSTRUCTIONS  Unless otherwise instructed, you should take regular prescription medications with a small sip of water as early as possible the morning of your procedure.          OTHER INSTRUCTIONS  You will need a responsible adult at least 75 years of age to accompany you and drive you home.   This person must remain in the waiting room during your procedure.  Wear loose fitting clothing that is easily removed.  Leave jewelry and other valuables at home.  However, you may wish to bring a book to read or an iPod/MP3 player to listen to music as you wait for your procedure to start.  Remove all body piercing jewelry and leave at home.  Total time from sign-in until discharge is approximately 2-3 hours.  You should go home directly after your procedure and rest.  You can resume normal activities the day after your procedure.  The day of your procedure you should not:   Drive  Make legal decisions   Operate machinery   Drink alcohol   Return to work  You will receive specific instructions about eating, activities and medications before you leave.   The above instructions have been reviewed and explained to me by   Lamona Curl CMA Duncan Dull)  March 03, 2010 2:53 PM     I fully understand and can verbalize these instructions _____________________________ Date _______

## 2010-04-26 NOTE — Progress Notes (Signed)
Summary: Pt will c-b Mon   Phone Note Outgoing Call   Call placed to: pt Summary of Call: Called patient to see if she can coming next wk 02-24-10 at 8:15am to see Dr Juanda Chance. She will check with her children because she is not driving and call back on Monday. Initial call taken by: Leanor Kail [facility],  February 16, 2010 2:01 PM  Follow-up for Phone Call        Patient unable to come on 02/24/10. She has been scheduled for an appointment with Dr. Lina Sar on 03/03/10. Follow-up by: Lamona Curl CMA Duncan Dull),  February 23, 2010 3:28 PM

## 2010-04-26 NOTE — Letter (Signed)
Summary: [facility]  [facility]   Imported By: Maryln Gottron 01/07/2010 14:24:13  _____________________________________________________________________  External Attachment:    Type:   Image     Comment:   External Document

## 2010-04-26 NOTE — Assessment & Plan Note (Signed)
Summary: 4 month rov/njr   Vital Signs:  Patient profile:   77 year old female Weight:      159 pounds Temp:     98.6 degrees F oral BP sitting:   118 / 60  (left arm) Cuff size:   regular  Vitals Entered By: Duard Brady LPN (Jul 28, 6385 10:32 AM) CC: 4 mos rov - doing well Is Patient Diabetic? No   CC:  4 mos rov - doing well.  History of Present Illness: 77 year old patient status post right hemicolectomy August 2010 for colon cancer, who is followed closely by general surgery and oncology.  A recent PET scan and CT scan bothersome for some thoracic adenopathy, but the patient wishes clinical follow-up and has declined a referral to a thoracic surgeon for biopsy. She is in today for follow-up of her hypertension and doing quite well.  She does care for an elderly husband with advanced COPD.  Allergies: 1)  ! Cipro (Ciprofloxacin Hcl) 2)  ! Robaxin (Methocarbamol) 3)  Amoxicillin (Amoxicillin)  Past History:  Past Medical History: Reviewed history from 11/03/2008 and no changes required. 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  The patient denies anorexia, fever, weight loss, vision loss, decreased hearing, hoarseness, chest pain, syncope, dyspnea on exertion, peripheral edema, prolonged cough, headaches, hemoptysis, abdominal pain, melena, hematochezia, severe indigestion/heartburn, hematuria, incontinence, genital sores, muscle weakness, suspicious skin lesions, transient blindness, difficulty walking, depression,  unusual weight change, abnormal bleeding, enlarged lymph nodes, angioedema, and breast masses.    Physical Exam  General:  overweight-appearing.  130/60overweight-appearing.   Head:  Normocephalic and atraumatic without obvious abnormalities. No apparent alopecia or balding. Eyes:  No corneal or conjunctival inflammation noted. EOMI. Perrla. Funduscopic exam benign, without hemorrhages, exudates or papilledema. Vision grossly normal. Mouth:  Oral mucosa and oropharynx without lesions or exudates.  Teeth in good repair. Neck:  No deformities, masses, or tenderness noted. Lungs:  Normal respiratory effort, chest expands symmetrically. Lungs are clear to auscultation, no crackles or wheezes. Heart:  Normal rate and regular rhythm. S1 and S2 normal without gallop, murmur, click, rub or other extra sounds. Abdomen:  Bowel sounds positive,abdomen soft and non-tender without masses, organomegaly or hernias noted. Msk:  No deformity or scoliosis noted of thoracic or lumbar spine.   Pulses:  R and L carotid,radial,femoral,dorsalis pedis and posterior tibial pulses are full and equal bilaterally Extremities:  No clubbing, cyanosis, edema, or deformity noted with normal full range of motion of all joints.     Impression & Recommendations:  Problem # 1:  HYPERTENSION (ICD-401.9)  Her updated medication list for this problem includes:    Atenolol 50 Mg Tabs (Atenolol) .Marland Kitchen... Take 1 tablet by mouth once a day    Hydrochlorothiazide 25 Mg Tabs (Hydrochlorothiazide) .Marland Kitchen... 1 tablet by mouth every morning  Her updated medication list for this problem includes:    Atenolol 50 Mg Tabs (Atenolol) .Marland Kitchen... Take 1 tablet by mouth once a day    Hydrochlorothiazide 25 Mg Tabs (Hydrochlorothiazide) .Marland Kitchen... 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 Take  1 tablet by mouth once a day 3)  Atenolol 50 Mg Tabs (Atenolol) .... Take 1 tablet by mouth once a day 4)  Hydrochlorothiazide 25 Mg Tabs (Hydrochlorothiazide) .Marland Kitchen.. 1 tablet by mouth every morning 5)  Xalatan 0.005 % Soln (Latanoprost) 6)  Calcium 600 600 Mg Tabs (Calcium carbonate) .... Take 1 daily 7)  Hydrocodone-homatropine 5-1.5 Mg/56ml Syrp (Hydrocodone-homatropine) .Marland Kitchen.. 1 teaspoon every 6 hours as needed for cough  Patient Instructions: 1)  Please schedule a follow-up appointment in 6 months. 2)  Limit your Sodium (Salt). 3)  It is important that you exercise regularly at least 20 minutes 5 times a week. If you develop chest pain, have severe difficulty breathing, or feel very tired , stop exercising immediately and seek medical attention. 4)  You need to lose weight. Consider a lower calorie diet and regular exercise.  Prescriptions: HYDROCHLOROTHIAZIDE 25 MG TABS (HYDROCHLOROTHIAZIDE) 1 tablet by mouth every morning  #90 x 6   Entered and Authorized by:   Gordy Savers  MD   Signed by:   Gordy Savers  MD on 07/28/2009   Method used:   [facility]  [address] [phone]* (retail)       [address]       [place], [place]  [address]       Ph: [phone] or [phone]       Fax: [phone]   RxID:   [id number] ATENOLOL 50 MG TABS (ATENOLOL) Take 1 tablet by mouth once a day  #90 x 6   Entered and Authorized by:   Gordy Savers  MD   Signed by:   Gordy Savers  MD on 07/28/2009   Method used:   [facility]        [facility] [address] [phone]* (retail)       [address]       [place], [place]  [address]       Ph: [phone] or [phone]       Fax: [phone]   RxID:   [id number]

## 2010-04-26 NOTE — Progress Notes (Signed)
Summary: missed visits  Phone Note From Other Clinic   Caller: ava,nurse, [facility] [phone] on phonepad Summary of Call: To inform you that I was unable to see her last week for 2 days because of insurance problem. Initial call taken by: Rudy Jew, RN,  November 24, 2009 3:04 PM

## 2010-04-28 NOTE — Assessment & Plan Note (Signed)
Summary: 6 month rov/njr   Vital Signs:  Patient profile:   77 year old female Weight:      158 pounds Temp:     98.0 degrees F oral BP sitting:   128 / 80  (left arm) Cuff size:   regular  Vitals Entered By: Duard Brady LPN (April 01, 2010 11:39 AM) CC: 6 mos rov - doing well  Is Patient Diabetic? No   Primary Care Provider:  Eleonore Chiquito, MD  CC:  6 mos rov - doing well .  History of Present Illness: 24 -year-old patient who is seen today for follow-up.  She has a history of colon cancer and her this week underwent follow-up colonoscopy.  She has a history of hypertension, which has been controlled with atenolol.  She has history of diverticulosis, which has been stable.  He  Allergies: 1)  ! Cipro (Ciprofloxacin Hcl) 2)  ! Robaxin (Methocarbamol) 3)  Amoxicillin (Amoxicillin)  Past History:  Past Medical History: Reviewed history from 11/03/2008 and no changes required. 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    Physical Exam  General:   Well-developed,well-nourished,in no acute distress; alert,appropriate and cooperative throughout examination Head:  Normocephalic and atraumatic without obvious abnormalities. No apparent alopecia or balding. Mouth:  Oral mucosa and oropharynx without lesions or exudates.  Teeth in good repair. Neck:  No deformities, masses, or tenderness noted. Lungs:  Normal respiratory effort, chest expands symmetrically. Lungs are clear to auscultation, no crackles or wheezes. Heart:  Normal rate and regular rhythm. S1 and S2 normal without gallop, murmur, click, rub or other extra sounds. Abdomen:  Bowel sounds positive,abdomen soft and non-tender without masses, organomegaly or hernias noted. Msk:  No deformity or scoliosis noted of thoracic or lumbar spine.   Extremities:  No clubbing, cyanosis, edema, or deformity noted with normal full range of motion of all joints.     Impression & Recommendations:  Problem # 1:  HYPERTENSION (ICD-401.9)  Her updated medication list for this problem includes:    Atenolol 50 Mg Tabs (Atenolol) .Marland Kitchen... Take 1 tablet by mouth once a day    Hydrochlorothiazide 25 Mg Tabs (Hydrochlorothiazide) .Marland Kitchen... 1 tablet by mouth every morning  Her updated medication list for this problem includes:    Atenolol 50 Mg Tabs (Atenolol) .Marland Kitchen... Take 1 tablet by mouth once a day    Hydrochlorothiazide 25 Mg Tabs (Hydrochlorothiazide) .Marland Kitchen... 1 tablet by mouth every morning  Problem # 2:  COLON CANCER, PERSONAL HX (ICD-V10.05)  Complete Medication List: 1)  Alphagan P 0.15 % Soln (Brimonidine tartrate) 2)  Aspirin 81 Mg Tbec (Aspirin) .... Take 1 tablet by mouth once a day 3)  Atenolol 50 Mg Tabs (Atenolol) .... Take 1 tablet by mouth once a day 4)  Hydrochlorothiazide 25 Mg Tabs (Hydrochlorothiazide) .Marland Kitchen.. 1 tablet  by mouth every morning 5)  Xalatan 0.005 % Soln (Latanoprost) 6)  Calcium 600 600 Mg Tabs (Calcium carbonate) .... Take 1 daily 7)  Klor-con M20 20 Meq Cr-tabs (Potassium  chloride crys cr) .... Qd 8)  Miralax Powd (Polyethylene glycol 3350) .... As per prep  instructions. 9)  Reglan 10 Mg Tabs (Metoclopramide hcl) .... As per prep instructions. 10)  Dulcolax 5 Mg Tbec (Bisacodyl) .... Day before procedure take 2 at 3pm and 2 at 8pm. 11)  Lomotil 2.5-0.025 Mg Tabs (Diphenoxylate-atropine) .Marland Kitchen.. 1 by mouth once daily as needed for diarrhea.  Patient Instructions: 1)  Please schedule a follow-up appointment in 4 months. 2)  Limit your Sodium (Salt) to less than 2 grams a day(slightly less than 1/2 a teaspoon) to prevent fluid retention, swelling, or worsening of symptoms. 3)  It is important that you exercise regularly at least 20 minutes 5 times a week. If you develop chest pain, have severe difficulty breathing, or feel very tired , stop exercising immediately and seek medical attention.   Orders Added: 1)  Est. Patient Level III [[address]]

## 2010-04-28 NOTE — Procedures (Signed)
Summary: Colonoscopy  Patient: Karen [name] Note: All result statuses are Final unless otherwise noted.  Tests: (1) Colonoscopy (COL)   COL Colonoscopy           DONE     [facility]     [address]     [place], [place]  [address]           COLONOSCOPY PROCEDURE REPORT           PATIENT:  Karen [name], Karen [name]  MR#:  [id number]     BIRTHDATE:  [birth year], 87 yrs. old  GENDER:  female     ENDOSCOPIST:  Hedwig Morton. Juanda Chance, MD     REF. BY:  Darnell Level, M.D.     PROCEDURE DATE:  03/30/2010     PROCEDURE:  Colonoscopy 13086     ASA CLASS:  Class II     INDICATIONS:  history of colon cancer hx of cecal carcinoma     11/2008, intermittent diarrhea     hx adenomatous polyps 1988, 2001, 2006     MEDICATIONS:   Versed 7 mg, Fentanyl 62.5 mcg           DESCRIPTION OF PROCEDURE:   After the risks benefits and     alternatives of the procedure were thoroughly explained, informed     consent was obtained.  Digital rectal exam was performed and     revealed no rectal masses.   The LB [id number] endoscope     was introduced through the anus and advanced to the anastomosis,     without limitations.  The quality of the prep was good, using     MiraLax.  The instrument was then slowly withdrawn as the colon     was fully examined.     <<PROCEDUREIMAGES>>           FINDINGS:  The right colon was surgically resected and an     ileo-colonic anastamosis was seen. With standard forceps, biopsy     was obtained and sent to pathology (see image2, image3, and     image4). bioopsy of the anastomotic erosions  An ulcer was found     in the ascending colon. 4 mm linear ulcer distal from the     anastomosis, appears benigh With standard forceps, biopsy was     obtained and sent to pathology (see image5).  Mild diverticulosis     was found (see image4).  Internal hemorrhoids were found (see     image6).  Otherwise normal colonoscopy without other polyps,     masses, vascular ectasias, or  inflammatory changes (see image1).     Retroflexed views in the rectum revealed no abnormalities.    The     scope was then withdrawn from the patient and the procedure     completed.     COMPLICATIONS:  None     ENDOSCOPIC IMPRESSION:     1) Prior right hemi-colectomy     2) Ulcer in the ascending colon     3) Mild diverticulosis     4) Internal hemorrhoids     5) Otherwise nl colonoscopy WMO     no evidence of recurrent cancer     RECOMMENDATIONS:     1) Await biopsy results     Lomotil 1 po qd prn diarrhea, #30, 1 refill     REPEAT EXAM:  In 2 year(s) for.           ______________________________  Hedwig Morton. Juanda Chance, MD           CC:           n.     eSIGNED:   Hedwig Morton. Rolfe Hartsell at 03/30/2010 04:46 PM           Darlyne Russian, [phone]  Note: An exclamation mark (!) indicates a result that was not dispersed into the flowsheet. Document Creation Date: 03/30/2010 4:46 PM _______________________________________________________________________  (1) Order result status: Final Collection or observation date-time: 03/30/2010 16:33 Requested date-time:  Receipt date-time:  Reported date-time:  Referring Physician:   Ordering Physician: Lina Sar [id number]) Specimen Source:  Source: Launa Grill Order Number: [id number] Lab site:

## 2010-04-28 NOTE — Miscellaneous (Signed)
Summary: Lomotil Rx  Clinical Lists Changes  Medications: Added new medication of LOMOTIL 2.5-0.025 MG TABS (DIPHENOXYLATE-ATROPINE) 1 by mouth once daily as needed for diarrhea. - Signed Rx of LOMOTIL 2.5-0.025 MG TABS (DIPHENOXYLATE-ATROPINE) 1 by mouth once daily as needed for diarrhea.;  #30 x 1;  Signed;  Entered by: Jennye Boroughs RN;  Authorized by: Hart Carwin MD;  Method used: Print then Give to Patient    Prescriptions: LOMOTIL 2.5-0.025 MG TABS (DIPHENOXYLATE-ATROPINE) 1 by mouth once daily as needed for diarrhea.  #30 x 1   Entered by:   Jennye Boroughs RN   Authorized by:   Hart Carwin MD   Signed by:   Jennye Boroughs RN on 03/30/2010   Method used:   Print then Give to Patient   RxID:   [id number]

## 2010-04-28 NOTE — Letter (Signed)
Summary: Patient Notice- Colon Biospy Results  [facility]  [address] [place], [place] [address]   Phone: [phone]  Fax: [phone]        April 02, 2010 MRN: [id number]    [facility] [address] [place], [place]  [address]    Dear Ms. Street,  I am pleased to inform you that the biopsies taken during your recent colonoscopy did not show any evidence of cancer upon pathologic examination.  Additional information/recommendations:  _x_No further action is needed at this time.  Please follow-up with      your primary care physician for your other healthcare needs.  __Please call [phone] to schedule a return visit to review      your condition.  __Continue with the treatment plan as outlined on the day of your      exam.  _  Please call us if you are having persistent problems or have questions about your condition that have not been fully answered at this time.  Sincerely,  Hart Carwin MD   This letter has been electronically signed by your physician.  Appended Document: Patient Notice- Colon Biospy Results Letter mailed

## 2010-04-28 NOTE — Assessment & Plan Note (Signed)
Summary: discuss colonoscopy/dns    History of Present Illness Visit Type: Follow-up Visit Primary GI MD: Lina Sar MD Primary Chardae Mulkern: Eleonore Chiquito, MD Chief Complaint: Discuss Colonoscopy, personal hx of colon cancer. Pt denies any abd pain or changes in bowels but she does have intermittant diarrhea.  History of Present Illness:   This is an 77 year old white female with a recent history of cecal carcinoma resected by Dr.Gerkin in September 2010. She is due for a followup colonoscopy. She has a history of adenomatous polyps of the colon starting in 1988 and subsequently in 2001 and again in 2006. She also underwent an incidental cholecystectomy during her right hemicolectomy. She is having occasional diarrhea. She has no specific complaints today.   GI Review of Systems      Denies abdominal pain, acid reflux, belching, bloating, chest pain, dysphagia with liquids, dysphagia with solids, heartburn, loss of appetite, nausea, vomiting, vomiting blood, weight loss, and  weight gain.      Reports diarrhea.     Denies anal fissure, black tarry stools, change in bowel habit, constipation, diverticulosis, fecal incontinence, heme positive stool, hemorrhoids, irritable bowel syndrome, jaundice, light color stool, liver problems, rectal bleeding, and  rectal pain.    Current Medications (verified): 1)  Alphagan P 0.15 % Soln (Brimonidine Tartrate) 2)  Aspirin 81 Mg Tbec (Aspirin) .... Take 1 Tablet By Mouth Once A Day 3)  Atenolol 50 Mg Tabs (Atenolol) .... Take 1 Tablet By Mouth Once A Day 4)  Hydrochlorothiazide 25 Mg Tabs (Hydrochlorothiazide) .Marland Kitchen.. 1 Tablet By Mouth Every Morning 5)  Xalatan 0.005 % Soln (Latanoprost) 6)  Calcium 600 600 Mg Tabs (Calcium Carbonate) .... Take 1 Daily 7)  Klor-Con M20 20 Meq Cr-Tabs (Potassium Chloride Crys Cr) .... Qd  Allergies (verified): 1)  ! Cipro (Ciprofloxacin Hcl) 2)  ! Robaxin (Methocarbamol) 3)  Amoxicillin (Amoxicillin)  Past  History:  Past Medical History: Reviewed history from 11/03/2008 and no changes required. 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 Family History Lung cancer Family History of Stroke F 1st degree relative <60 Family History of Cardiovascular disorder:Father father died age 36 MI mother died age 75, CVA One brother died lung cancer 4 sisters; one died age 77, cerebral hemorrhage; one died age 21, senile dementia No FH of Colon Cancer:  Social History: Reviewed history from 02/25/2010 and no changes required. Retired Married Alcohol Use - no Illicit Drug Use - no  Review of Systems  The patient denies allergy/sinus, anemia, anxiety-new, arthritis/joint pain, back pain, blood in urine, breast changes/lumps, change in vision, confusion, cough, coughing up blood, depression-new, fainting, fatigue, fever, headaches-new, hearing problems, heart murmur, heart rhythm changes, itching, menstrual pain, muscle pains/cramps, night sweats, nosebleeds, pregnancy symptoms, shortness of breath, skin rash, sleeping problems, sore throat, swelling of feet/legs, swollen lymph glands, thirst - excessive , urination - excessive , urination changes/pain, urine leakage, vision changes, and voice change.         Pertinent positive and negative review of systems were noted in the above HPI. All other ROS was otherwise negative.   Vital  Signs:  Patient profile:   77 year old  female Height:      65 inches Weight:      156.25 pounds BMI:     26.10 Pulse rate:   78 / minute Pulse rhythm:   regular BP sitting:   128 / 64  (right arm) Cuff size:   regular  Vitals Entered By: Christie Nottingham CMA Duncan Dull) (March 03, 2010 2:09 PM)  Physical Exam  General:  Well developed, well nourished, no acute distress. Eyes:  PERRLA, no icterus. Mouth:  No deformity or lesions, dentition normal. Neck:  Supple; no masses or thyromegaly. Lungs:  Clear throughout to auscultation. Heart:  Regular rate and rhythm; no murmurs, rubs,  or bruits. Abdomen:  well-healed surgical scar and midline. Normoactive bowel sounds. No tenderness. No fullness or mass. Extremities:  1+ edema right ankle. Skin:  Intact without significant lesions or rashes. Psych:  Alert and cooperative. Normal mood and affect.   Impression & Recommendations:  Problem # 1:  COLON CANCER, PERSONAL HX (ICD-V10.05)  Patient has a history of cecal carcinoma for which she is status post right hemicolectomy in September 2010. She is status post small bowel obstruction in October 2010 which resolved spontaneously on NG suction. She is asymptomatic except for occasional diarrhea which could be related to a prior cholecystectomy or possibly due to bacterial overgrowth. She is due for a recall colonoscopy which will be scheduled for January 2012.  Orders: Colonoscopy (Colon)  Problem # 2:  GALLSTONES (NFA-213.08) Patient is status post cholecystectomy in September 2010.  Patient Instructions: 1)  You have been scheduled for a coloscopy. Please follow written prep instructions that were given to you today at your visit.  2)  Follow a high-fiber diet. 3)  Copy sent to : Dr Clelia Croft, Dr Gerrit Friends, Dr Amador Cunas 4)  The medication list was reviewed and reconciled.  All changed / newly prescribed medications were explained.  A complete medication list was provided to the patient / caregiver. Prescriptions: DULCOLAX 5 MG   TBEC (BISACODYL) Day before procedure take 2 at 3pm and 2 at 8pm.  #4 x 0   Entered by:   Lamona Curl CMA (AAMA)   Authorized by:   Hart Carwin MD   Signed by:   Lamona Curl CMA (AAMA) on 03/03/2010   Method used:   [facility]  [address] [phone]* (retail)       [address]       [place], [place]  [id number]       Ph: [phone] or [phone]       Fax: [phone]   RxID:   [id number] REGLAN 10 MG  TABS (METOCLOPRAMIDE HCL) As per prep instructions.  #2 x 0   Entered by:   Lamona Curl CMA (AAMA)   Authorized by:   Hart Carwin MD   Signed by:   Lamona Curl CMA (AAMA) on 03/03/2010   Method used:   [facility]        [facility]  [address] [phone]* (retail)       [address]       [place], [place]  [id number]       Ph: [phone] or [phone]       Fax: [phone]   RxID:   [id number] MIRALAX   POWD (POLYETHYLENE GLYCOL 3350)  As per prep  instructions.  #255gm x 0   Entered by:   Lamona Curl CMA (AAMA)   Authorized by:   Hart Carwin MD   Signed by:   Lamona Curl CMA (AAMA) on 03/03/2010   Method used:   [facility]        [facility]  [address] [phone][facility] (retail)       [address]       [place], [place]  [address]       Ph: [phone] or [phone]       Fax: [phone]   RxID:   [id number]

## 2010-04-29 NOTE — Letter (Signed)
Summary: [facility]  [facility]   Imported By: Sherian Rein 04/02/2009 07:30:53  _____________________________________________________________________  External Attachment:    Type:   Image     Comment:   External Document

## 2010-06-09 LAB — GLUCOSE, CAPILLARY: Glucose-Capillary: 134 mg/dL — ABNORMAL HIGH (ref 70–99)

## 2010-06-10 LAB — BASIC METABOLIC PANEL
BUN: 15 mg/dL (ref 6–23)
Calcium: 8.3 mg/dL — ABNORMAL LOW (ref 8.4–10.5)
Chloride: 96 mEq/L (ref 96–112)
GFR calc Af Amer: >60 mL/min (ref >60)
GFR calc non Af Amer: >60 mL/min (ref >60)
Glucose, Bld: 112 mg/dL — ABNORMAL HIGH (ref 70–99)
Potassium: 3 mEq/L — ABNORMAL LOW (ref 3.5–5.1)
Potassium: 3.9 mEq/L (ref 3.5–5.1)
Sodium: 130 mEq/L — ABNORMAL LOW (ref 135–145)

## 2010-06-10 LAB — COMPREHENSIVE METABOLIC PANEL
ALT: 15 U/L (ref 0–35)
AST: 31 U/L (ref 0–37)
Albumin: 3.4 g/dL — ABNORMAL LOW (ref 3.5–5.2)
Alkaline Phosphatase: 60 U/L (ref 39–117)
Chloride: 105 mEq/L (ref 96–112)
GFR calc Af Amer: >60 mL/min (ref >60)
Potassium: 3.3 mEq/L — ABNORMAL LOW (ref 3.5–5.1)
Sodium: 140 mEq/L (ref 135–145)
Total Bilirubin: 0.5 mg/dL (ref 0.3–1.2)
Total Protein: 7 g/dL (ref 6.0–8.3)

## 2010-06-10 LAB — CBC
HCT: 36.1 % (ref 36.0–46.0)
MCHC: 34.3 g/dL (ref 30.0–36.0)
MCHC: 35.9 g/dL (ref 30.0–36.0)
MCV: 86.6 fL (ref 78.0–100.0)
Platelets: 200 10*3/uL (ref 150–400)
Platelets: 206 10*3/uL (ref 150–400)
RBC: 4.54 MIL/uL (ref 3.87–5.11)
RDW: 13.3 % (ref 11.5–15.5)
RDW: 13.7 % (ref 11.5–15.5)
RDW: 13.8 % (ref 11.5–15.5)
WBC: 10.3 10*3/uL (ref 4.0–10.5)
WBC: 11.1 10*3/uL — ABNORMAL HIGH (ref 4.0–10.5)
WBC: 10.1 10*3/uL (ref 4.0–10.5)

## 2010-06-10 LAB — DIFFERENTIAL
Basophils Absolute: 0 10*3/uL (ref 0.0–0.1)
Basophils Relative: 0 % (ref 0–1)
Eosinophils Absolute: 0.1 10*3/uL (ref 0.0–0.7)
Eosinophils Relative: 1 % (ref 0–5)
Monocytes Absolute: 0.9 10*3/uL (ref 0.1–1.0)
Monocytes Relative: 9 % (ref 3–12)
Neutro Abs: 7.8 10*3/uL — ABNORMAL HIGH (ref 1.7–7.7)

## 2010-06-10 LAB — CK TOTAL AND CKMB (NOT AT ARMC)
CK, MB: 1 ng/mL (ref 0.3–4.0)
Total CK: 49 U/L (ref 7–177)

## 2010-06-10 LAB — POCT CARDIAC MARKERS: CKMB, poc: <1 ng/mL — ABNORMAL LOW (ref 1.0–8.0)

## 2010-06-10 LAB — MAGNESIUM: Magnesium: 2 mg/dL (ref 1.5–2.5)

## 2010-06-10 LAB — CROSSMATCH

## 2010-06-10 LAB — GLUCOSE, CAPILLARY

## 2010-06-10 LAB — CARDIAC PANEL(CRET KIN+CKTOT+MB+TROPI)
Total CK: 44 U/L (ref 7–177)
Total CK: 43 U/L (ref 7–177)
Troponin I: 0.03 ng/mL (ref 0.00–0.06)

## 2010-06-10 LAB — ABO/RH: ABO/RH(D): AB NEG

## 2010-06-10 LAB — TROPONIN I: Troponin I: 0.02 ng/mL (ref 0.00–0.06)

## 2010-06-14 ENCOUNTER — Other Ambulatory Visit: Payer: Self-pay | Admitting: Oncology

## 2010-06-14 ENCOUNTER — Encounter (HOSPITAL_BASED_OUTPATIENT_CLINIC_OR_DEPARTMENT_OTHER): Payer: Medicare Other | Admitting: Oncology

## 2010-06-14 DIAGNOSIS — C189 Malignant neoplasm of colon, unspecified: Secondary | ICD-10-CM

## 2010-06-14 DIAGNOSIS — R599 Enlarged lymph nodes, unspecified: Secondary | ICD-10-CM

## 2010-06-14 DIAGNOSIS — D509 Iron deficiency anemia, unspecified: Secondary | ICD-10-CM

## 2010-06-14 DIAGNOSIS — C180 Malignant neoplasm of cecum: Secondary | ICD-10-CM

## 2010-06-14 LAB — CBC WITH DIFFERENTIAL/PLATELET
Basophils Absolute: 0 10*3/uL (ref 0.0–0.1)
Eosinophils Absolute: 0.1 10*3/uL (ref 0.0–0.5)
HCT: 40.3 % (ref 34.8–46.6)
HGB: 13.5 g/dL (ref 11.6–15.9)
MCV: 87.6 fL (ref 79.5–101.0)
NEUT#: 6.1 10*3/uL (ref 1.5–6.5)
NEUT%: 72.2 % (ref 38.4–76.8)
RDW: 14 % (ref 11.2–14.5)
lymph#: 1.5 10*3/uL (ref 0.9–3.3)

## 2010-06-14 LAB — CEA: CEA: 3.5 ng/mL (ref 0.0–5.0)

## 2010-06-14 LAB — COMPREHENSIVE METABOLIC PANEL
Albumin: 4 g/dL (ref 3.5–5.2)
BUN: 22 mg/dL (ref 6–23)
Calcium: 9.4 mg/dL (ref 8.4–10.5)
Chloride: 101 mEq/L (ref 96–112)
Glucose, Bld: 116 mg/dL — ABNORMAL HIGH (ref 70–99)
Potassium: 4.3 mEq/L (ref 3.5–5.3)

## 2010-06-15 LAB — GLUCOSE, CAPILLARY: Glucose-Capillary: 115 mg/dL — ABNORMAL HIGH (ref 70–99)

## 2010-06-30 LAB — POTASSIUM: Potassium: 3.7 mEq/L (ref 3.5–5.1)

## 2010-06-30 LAB — CBC
Platelets: 252 10*3/uL (ref 150–400)
WBC: 14.4 10*3/uL — ABNORMAL HIGH (ref 4.0–10.5)

## 2010-07-01 LAB — BASIC METABOLIC PANEL
BUN: 10 mg/dL (ref 6–23)
Calcium: 8.5 mg/dL (ref 8.4–10.5)
GFR calc Af Amer: >60 mL/min (ref >60)
GFR calc non Af Amer: >60 mL/min (ref >60)
GFR calc non Af Amer: >60 mL/min (ref >60)
Glucose, Bld: 130 mg/dL — ABNORMAL HIGH (ref 70–99)
Potassium: 3.6 mEq/L (ref 3.5–5.1)
Sodium: 137 mEq/L (ref 135–145)
Sodium: 139 mEq/L (ref 135–145)

## 2010-07-01 LAB — CBC
HCT: 32.7 % — ABNORMAL LOW (ref 36.0–46.0)
Hemoglobin: 10.7 g/dL — ABNORMAL LOW (ref 12.0–15.0)
Hemoglobin: 10.8 g/dL — ABNORMAL LOW (ref 12.0–15.0)
Hemoglobin: 12.8 g/dL (ref 12.0–15.0)
Platelets: 257 10*3/uL (ref 150–400)
RBC: 4.91 MIL/uL (ref 3.87–5.11)
RDW: 17.9 % — ABNORMAL HIGH (ref 11.5–15.5)
RDW: 18.1 % — ABNORMAL HIGH (ref 11.5–15.5)
WBC: 14.1 10*3/uL — ABNORMAL HIGH (ref 4.0–10.5)
WBC: 15.1 10*3/uL — ABNORMAL HIGH (ref 4.0–10.5)

## 2010-07-01 LAB — DIFFERENTIAL
Basophils Relative: 0 % (ref 0–1)
Eosinophils Absolute: 0 10*3/uL (ref 0.0–0.7)
Monocytes Relative: 4 % (ref 3–12)
Neutrophils Relative %: 89 % — ABNORMAL HIGH (ref 43–77)

## 2010-07-01 LAB — URINALYSIS, MICROSCOPIC ONLY
Bilirubin Urine: NEGATIVE
Glucose, UA: NEGATIVE mg/dL
Hgb urine dipstick: NEGATIVE
Ketones, ur: NEGATIVE mg/dL
Leukocytes, UA: NEGATIVE
pH: 6.5 (ref 5.0–8.0)

## 2010-07-01 LAB — URINE CULTURE
Colony Count: >=100000
Special Requests: NEGATIVE

## 2010-07-01 LAB — COMPREHENSIVE METABOLIC PANEL
ALT: 25 U/L (ref 0–35)
Alkaline Phosphatase: 68 U/L (ref 39–117)
CO2: 26 mEq/L (ref 19–32)
Glucose, Bld: 173 mg/dL — ABNORMAL HIGH (ref 70–99)
Potassium: 3.4 mEq/L — ABNORMAL LOW (ref 3.5–5.1)
Sodium: 137 mEq/L (ref 135–145)
Total Protein: 8 g/dL (ref 6.0–8.3)

## 2010-07-02 LAB — COMPREHENSIVE METABOLIC PANEL
BUN: 12 mg/dL (ref 6–23)
CO2: 27 mEq/L (ref 19–32)
Chloride: 105 mEq/L (ref 96–112)
Creatinine, Ser: 0.74 mg/dL (ref 0.4–1.2)
GFR calc non Af Amer: >60 mL/min (ref >60)
Total Bilirubin: 0.4 mg/dL (ref 0.3–1.2)

## 2010-07-02 LAB — PROTIME-INR: Prothrombin Time: 13.2 seconds (ref 11.6–15.2)

## 2010-07-02 LAB — CROSSMATCH

## 2010-07-02 LAB — CBC
HCT: 29.4 % — ABNORMAL LOW (ref 36.0–46.0)
Hemoglobin: 10 g/dL — ABNORMAL LOW (ref 12.0–15.0)
MCHC: 33.2 g/dL (ref 30.0–36.0)
MCV: 81.4 fL (ref 78.0–100.0)
MCV: 82.2 fL (ref 78.0–100.0)
RBC: 3.57 MIL/uL — ABNORMAL LOW (ref 3.87–5.11)
RDW: 15.1 % (ref 11.5–15.5)
WBC: 9.8 10*3/uL (ref 4.0–10.5)

## 2010-07-02 LAB — URINALYSIS, ROUTINE W REFLEX MICROSCOPIC
Glucose, UA: NEGATIVE mg/dL
Ketones, ur: NEGATIVE mg/dL
Leukocytes, UA: NEGATIVE
Nitrite: POSITIVE — AB
Protein, ur: NEGATIVE mg/dL

## 2010-07-02 LAB — DIFFERENTIAL
Basophils Absolute: 0 10*3/uL (ref 0.0–0.1)
Lymphocytes Relative: 17 % (ref 12–46)
Neutro Abs: 7.2 10*3/uL (ref 1.7–7.7)

## 2010-07-02 LAB — BASIC METABOLIC PANEL
CO2: 27 mEq/L (ref 19–32)
Calcium: 8.5 mg/dL (ref 8.4–10.5)
Chloride: 105 mEq/L (ref 96–112)
Glucose, Bld: 143 mg/dL — ABNORMAL HIGH (ref 70–99)
Sodium: 137 mEq/L (ref 135–145)

## 2010-07-02 LAB — CEA: CEA: 2.6 ng/mL (ref 0.0–5.0)

## 2010-07-28 ENCOUNTER — Encounter: Payer: Self-pay | Admitting: Internal Medicine

## 2010-08-01 ENCOUNTER — Encounter: Payer: Self-pay | Admitting: Internal Medicine

## 2010-08-01 ENCOUNTER — Ambulatory Visit (INDEPENDENT_AMBULATORY_CARE_PROVIDER_SITE_OTHER): Payer: Medicare Other | Admitting: Internal Medicine

## 2010-08-01 DIAGNOSIS — I10 Essential (primary) hypertension: Secondary | ICD-10-CM

## 2010-08-01 DIAGNOSIS — N3281 Overactive bladder: Secondary | ICD-10-CM

## 2010-08-01 DIAGNOSIS — M549 Dorsalgia, unspecified: Secondary | ICD-10-CM

## 2010-08-01 DIAGNOSIS — I359 Nonrheumatic aortic valve disorder, unspecified: Secondary | ICD-10-CM

## 2010-08-01 DIAGNOSIS — N318 Other neuromuscular dysfunction of bladder: Secondary | ICD-10-CM

## 2010-08-01 DIAGNOSIS — H409 Unspecified glaucoma: Secondary | ICD-10-CM

## 2010-08-01 NOTE — Patient Instructions (Signed)
Limit your sodium (Salt) intake    It is important that you exercise regularly, at least 20 minutes 3 to 4 times per week.  If you develop chest pain or shortness of breath seek  medical attention.  Return in 4 months for follow-up   

## 2010-08-01 NOTE — Progress Notes (Signed)
  Subjective:    Patient ID: Karen [name], female    DOB: [birth year], 77 y.o.   MRN: [id number]  HPI  77 year old patient who is seen today for followup. She has a history of treated hypertension which has been controlled on diuretic therapy as well as hydrochlorothiazide. She has discontinued diuretic therapy due to frequent urination. This has not improved. She describes urinary frequency and urgency. She is followed by ophthalmology for glaucoma. She has a history of hypokalemia and has been on supplemental potassium. She has chronic low back pain and a history of aortic insufficiency    Review of Systems  Constitutional: Negative.   HENT: Negative for hearing loss, congestion, sore throat, rhinorrhea, dental problem, sinus pressure and tinnitus.   Eyes: Negative for pain, discharge and visual disturbance.  Respiratory: Negative for cough and shortness of breath.   Cardiovascular: Negative for chest pain, palpitations and leg swelling.  Gastrointestinal: Negative for nausea, vomiting, abdominal pain, diarrhea, constipation, blood in stool and abdominal distention.  Genitourinary: Positive for urgency and frequency. Negative for dysuria, hematuria, flank pain, vaginal bleeding, vaginal discharge, difficulty urinating, vaginal pain and pelvic pain.  Musculoskeletal: Positive for back pain. Negative for joint swelling, arthralgias and gait problem.  Skin: Negative for rash.  Neurological: Negative for dizziness, syncope, speech difficulty, weakness, numbness and headaches.  Hematological: Negative for adenopathy.  Psychiatric/Behavioral: Negative for behavioral problems, dysphoric mood and agitation. The patient is not nervous/anxious.        Objective:   Physical Exam  Constitutional: She is oriented to person, place, and time. She appears well-developed and well-nourished.  HENT:  Head: Normocephalic.  Right Ear: External ear normal.  Left Ear: External ear normal.  Mouth/Throat:  Oropharynx is clear and moist.  Eyes: Conjunctivae and EOM are normal. Pupils are equal, round, and reactive to light.  Neck: Normal range of motion. Neck supple. No thyromegaly present.  Cardiovascular: Normal rate, regular rhythm, normal heart sounds and intact distal pulses.   Pulmonary/Chest: Effort normal and breath sounds normal.  Abdominal: Soft. Bowel sounds are normal. She exhibits no mass. There is no tenderness.  Musculoskeletal: Normal range of motion.  Lymphadenopathy:    She has no cervical adenopathy.  Neurological: She is alert and oriented to person, place, and time.  Skin: Skin is warm and dry. No rash noted.  Psychiatric: She has a normal mood and affect. Her behavior is normal.          Assessment & Plan:   Hypertension. Well controlled off diuretic therapy Overactive bladder. We'll give her a trial of Gala Murdoch-  she will check with her ophthalmologist prior to initiating a trial. Samples were provided Back pain. Stable Glaucoma

## 2010-08-09 NOTE — Assessment & Plan Note (Signed)
[facility] OFFICE NOTE   NAME:Karen [name], Karen [name]                       MRN:          [id number]  DATE:08/07/2006                            DOB:          06/19/1922    An 77 year old white female seen today for a wellness exam.  She is  status post surgery for acute appendicitis last month.  She has done  well.  She has hypertension, diverticulosis, and is followed for  glaucoma.  Doing quite well today.  No concerns or complaints.  She was  hospitalized in 2001 for syncope.  She has had cataract surgery.  Had a  D&C in the past.  She has also been evaluated for right central retinal  artery occlusion.  Had a negative carotid study in 2001.  Last  colonoscopy was in 2006.   FAMILY HISTORY:  Unchanged with the exception that a sister died  recently at 61 with dementia.   EXAMINATION:  Revealed a healthy-appearing female, in no acute distress.  Blood pressure was well controlled.  SKIN:  Negative.  Fundi, ears, nose and throat clear.  NECK:  No bruits.  CHEST:  Clear.  BREASTS:  Negative.  CARDIOVASCULAR:  Normal heart sounds.  No murmurs.  ABDOMEN:  Mildly overweight.  Soft and non-tender.  No organomegaly.  Healing laparoscopic scar is noted.  No bruits appreciated.  EXTREMITIES:  Negative with intact peripheral pulses.  NEUROLOGIC:  Intact.  PELVIC EXAM:  Revealed no cervix.  No adnexal masses.   IMPRESSION:  1. Hypertension.  2. Status post appendectomy.  3. History of diverticulosis.  4. Glaucoma.   DISPOSITION:  1. Medical regimen unchanged.  2. Reassess in 6 months.     Gordy Savers, MD  Electronically Signed    PFK/MedQ  DD: 08/07/2006  DT: 08/07/2006  Job #: [id number]

## 2010-08-09 NOTE — Discharge Summary (Signed)
NAMEISRAEL, Karen [name] NO.:  [id number]   MEDICAL RECORD NO.:  [id number]          PATIENT TYPE:  INP   LOCATION:  1531                         FACILITY:  [facility]   PHYSICIAN:  Velora Heckler, MD      DATE OF BIRTH:  07/29/1922   DATE OF ADMISSION:  11/20/2008  DATE OF DISCHARGE:  11/26/2008                               DISCHARGE SUMMARY   REASON FOR ADMISSION:  Adenocarcinoma right colon.   HISTORY OF PRESENT ILLNESS:  The patient is an 77 year old white female  well-known to [facility].  The patient had developed  anemia and was found to have melena.  She underwent colonoscopy and was  found to have a large cecal mass.  Biopsies confirmed adenocarcinoma.  The patient also had known gallstones with intermittent right upper  quadrant abdominal pain.  She is referred to our surgical practice for  cholecystectomy and right colectomy.   HOSPITAL COURSE:  The patient was admitted on the general surgical  service.  She was taken directly to the operating room on August 27  where she underwent open cholecystectomy and right colectomy.  Postoperative course was straightforward.  The patient was started on a  clear liquid diet.  She made gradual progress as her ileus resolved and  was advanced to full liquids on the third postoperative day.  She began  a regular diet on the fourth postoperative day.  She had return of bowel  function with bowel movement.  The patient is prepared for discharge  home on the sixth postoperative day.   Final pathology shows invasive colorectal carcinoma involving the cecum.  Eleven lymph nodes were negative for metastatic disease.  The tumor  measured 7.5 cm making it as a TNM staging pT3 pN0 pMx.   DISCHARGE PLAN:  The patient is prepared for discharge home today  November 26, 2008, in good condition, tolerating a regular diet and  ambulating with assistance.  The patient will be seen back in my office  at [facility] in 5 days for wound check and staple  removal.   DISCHARGE MEDICATIONS:  Include Vicodin as needed for pain.   FINAL DIAGNOSES:  Adenocarcinoma of the cecum with negative lymph nodes.   CONDITION AT DISCHARGE:  Good.      Velora Heckler, MD  Electronically Signed     TMG/MEDQ  D:  11/26/2008  T:  11/26/2008  Job:  [id number]   cc:   Gordy Savers, MD  [address] [place]  [place] [address]   Hedwig Morton. Juanda Chance, MD  [address]  [place]  [place] [address]

## 2010-08-09 NOTE — Op Note (Signed)
NAMEDASHAY, GIESLER NO.:  [id number]   MEDICAL RECORD NO.:  [id number]          PATIENT TYPE:  INP   LOCATION:  0003                         FACILITY:  [facility]   PHYSICIAN:  Velora Heckler, MD      DATE OF BIRTH:  May 27, 1922   DATE OF PROCEDURE:  11/20/2008  DATE OF DISCHARGE:                               OPERATIVE REPORT   PREOPERATIVE DIAGNOSES:  1. Adenocarcinoma of the cecum.  2. Symptomatic cholelithiasis.   POSTOPERATIVE DIAGNOSES:  1. Adenocarcinoma of the cecum.  2. Symptomatic cholelithiasis.   PROCEDURE:  1. Right colectomy.  2. Open cholecystectomy.   SURGEON:  Velora Heckler, M.D., FACS   ANESTHESIA:  General.   ESTIMATED BLOOD LOSS:  Minimal.   PREPARATION:  Chloraprep.   COMPLICATIONS:  None.   INDICATIONS:  The patient is an 77 year old white female, well-known to  our surgical practice.  The patient had developed anemia and melena.  She underwent a colonoscopy and was found to have a large cecal mass.  Biopsies confirmed adenocarcinoma.  The patient also has known  gallstones, causing intermittent mild right upper quadrant abdominal  pain.  The patient now comes to surgery for resection of her colon  carcinoma and concurrent cholecystectomy.   DESCRIPTION OF PROCEDURE:  The procedure is done in OR #11 at the [facility].  The patient is brought to the operating room  and placed in a supine position on the operating room table.  Following  administration of general anesthesia, the patient is prepped and draped  in the usual strict aseptic fashion.  After ascertaining that an  adequate level of anesthesia been achieved, a midline abdominal incision  is made with a #10 blade.  Dissection was carried through subcutaneous  tissues.  The fascia was incised in the midline and the peritoneal  cavity is entered cautiously.  A Balfour retractor was placed for  exposure.  Palpation of the right colon shows a large mass,  essentially  filling the cecum.  It is mobile.  The appendix is surgically absent.  The right colon is mobilized along the lateral peritoneal reflection.  A  point in the distal ileum is selected and transected with a GIA stapler.  The terminal ileum was mobilized.  The mesentery is divided with the  harmonic scalpel.  The omentum was mobilized off of the transverse  colon.  A point in the proximal transverse colon is selected and  transected with the GIA stapler.  The remainder of the colonic mesentery  was then taken down with the harmonic scalpel, with good hemostasis  achieved.  The entire right colon is excised.  It is submitted fresh to  pathology for review.  The right colic artery is identified and ligated  with 2-0 silk tie.  Next, a side-to-side functionally end-to-end  anastomosis was created between the terminal ileum and the proximal  transverse colon.  This was performed with a GIA stapler.  The staple  line was inspected for hemostasis.  The enterotomy was closed with a TA-  60 stapler.  The mesenteric  defect is closed with interrupted 2-0 silk  sutures.  The abdomen was irrigated and irrigation evacuated.  Good  hemostasis is noted.  The omentum is used to cover the anastomosis.   Next, we turned our attention to the gallbladder.  The gallbladder was  moderately distended.  Palpation of the liver shows a large cystic mass  in the right hepatic lobe on the anterior edge.  No evidence of  metastatic disease is visible or palpable.  The gallbladder is mobilized  from the gallbladder bed using the electrocautery and the harmonic  scalpel.  Dissection was carried down to the neck of the gallbladder.  The cystic artery was identified and divided between large hemoclips  with the harmonic scalpel.  The cystic duct is identified and divided  between large Ligaclips with the harmonic scalpel.  The gallbladder was  completely excised and submitted to pathology for review.  The  right  upper quadrant was irrigated with warm saline.  Good hemostasis was  noted.  All packs were removed.  The midline incision was closed with  interrupted #1 Novofil simple sutures.  The subcutaneous tissues were  irrigated.  The skin was closed with stainless steel staples.  The wound  is washed and dried and sterile dressings were applied.   The patient is awakened from anesthesia and brought to the recovery room  in stable condition.  The patient tolerated the procedure well.      Velora Heckler, MD  Electronically Signed     TMG/MEDQ  D:  11/20/2008  T:  11/20/2008  Job:  [id number]   cc:   Hedwig Morton. Juanda Chance, MD  [address]  [place]  [place] [address]   Gordy Savers, MD  [address] [place]  [place] [address]

## 2010-08-12 NOTE — Op Note (Signed)
[name], [name] NO.:  [id number]   MEDICAL RECORD NO.:  [id number]          PATIENT TYPE:  INP   LOCATION:  0098                         FACILITY:  [facility]   PHYSICIAN:  Velora Heckler, MD      DATE OF BIRTH:  1922-11-05   DATE OF PROCEDURE:  07/09/2006  DATE OF DISCHARGE:                               OPERATIVE REPORT   PREOPERATIVE DIAGNOSIS:  Acute appendicitis.   POSTOPERATIVE DIAGNOSIS:  Acute appendicitis with perforation.   PROCEDURE:  Laparoscopic appendectomy.   SURGEON:  Velora Heckler, MD, FACS   ANESTHESIA:  General.   ANESTHESIOLOGIST:  Sherrian Divers, M.D.   ESTIMATED BLOOD LOSS:  Minimal.   PREPARATION:  Betadine.   COMPLICATIONS:  None.   INDICATIONS:  The patient is an 77 year old white female who presents to  the emergency department with 24-hour history of right lower quadrant  pain, nausea and vomiting.  Evaluation showed an elevated white blood  cell count of 24,000.  CT scan of abdomen and pelvis showed acute  appendicitis with possible perforation.  The patient is now brought to  the operating room for appendectomy.   BODY OF REPORT:  Procedure is done in OR #1 at the [facility].  The patient is brought to the operating room and placed in a  supine position on the operating room table.  Following administration  of general anesthesia, the patient is prepped and draped in the usual  strict aseptic fashion.  After ascertaining that an adequate level of  anesthesia had been obtained, an infraumbilical incision is made with a  #15 blade.  Dissection is carried down to the fascia.  Fascia is incised  in the midline and the peritoneal cavity is entered cautiously.  A 0  Vicryl pursestring suture is placed in the fascia.  A Hasson cannula is  introduced and secured with the pursestring suture.  The abdomen is  insufflated with carbon dioxide.  A laparoscope is introduced and the  abdomen explored.  Operative  ports are placed in the right upper  quadrant and left lower quadrant.  Cecum is mobilized.  Unfortunately,  the appendix is retrocecal.  With complete mobilization of the cecum the  appendix is visible.  It appears to be contained within the peritoneum  on the posterior aspect of the cecum.  It appears to be gangrenous.  With gentle dissection, the base of the appendix is identified at its  junction with the cecal wall.  Using the harmonic scalpel, a window is  created at the base of the appendix.  An Endo-GIA stapler is inserted  and placed across the base of the appendix and fired.  Grasping the cut  end of the appendix, the appendix is gently elevated.  The mesoappendix  is then taken down with the harmonic scalpel.  Peritoneum is opened on  both sides of the appendix and the appendix is gently lifted out of the  adipose tissue behind the cecum, taking care not to fragment the  appendix.  The entire appendix is excised and placed into an EndoCatch  bag, which is withdrawn through the umbilical port.  Right lower  quadrant is copiously irrigated with warm saline.  Good hemostasis is  noted.  Fluid is evacuated.  Staple line appears intact with no evidence  of bleeding.  Fluid is completely evacuated and pneumoperitoneum is  released.  Ports are removed.  Good hemostasis is noted at all port  sites.  The 0 Vicryl pursestring suture is tied securely.  All 3 port  sites are anesthetized with local anesthetic.  All 3 wounds are closed  with interrupted 4-0 Vicryl subcuticular sutures.  Wounds are washed and  dried and Benzoin and Steri-Strips are applied.  Sterile dressings are  applied.  The patient is awakened from anesthesia and brought to the  recovery room in stable condition.  The patient tolerated the procedure  well.      Velora Heckler, MD  Electronically Signed     TMG/MEDQ  D:  07/09/2006  T:  07/09/2006  Job:  [id number]   cc:   Gordy Savers, MD  [address] [place]  [place] [address]

## 2010-08-12 NOTE — H&P (Signed)
Karen [name], MERVINE NO.:  [id number]   MEDICAL RECORD NO.:  [id number]          PATIENT TYPE:  EMS   LOCATION:  ED                           FACILITY:  [facility]   PHYSICIAN:  Velora Heckler, MD      DATE OF BIRTH:  July 23, 1922   DATE OF ADMISSION:  07/09/2006  DATE OF DISCHARGE:                              HISTORY & PHYSICAL   REASON FOR ADMISSION:  Acute appendicitis.   HISTORY OF PRESENT ILLNESS:  The patient is an 77 year old white female  who presents to the emergency department at [facility] with a 24-hour history of right lower quadrant abdominal pain,  nausea, vomiting, and chills.  The patient presented the emergency  department where she was assessed.  Laboratory studies included a white  blood cell count which was elevated at 24,000.  CT scan abdomen and  pelvis showed findings consistent with acute appendicitis with a small  amount of extraluminal air consistent with possible perforation.  General surgery is now called for management.   PAST MEDICAL HISTORY:  1. History of hypertension.  2. History of cerebrovascular accident in 2002.  3. History of glaucoma.   MEDICATIONS:  Atenolol, hydrochlorothiazide, TobraDex.   ALLERGIES:  None known.   SOCIAL HISTORY:  The patient is married.  She has three children.  She  does not smoke.  She does not drink alcohol.   FAMILY HISTORY:  Unremarkable.   A 15 system review was otherwise unremarkable.   PRIMARY CARE PHYSICIAN:  Dr. Eleonore Chiquito.   PHYSICAL EXAMINATION:  GENERAL:  An 77 year old bright and alert white  female with slightly inappropriate affect.  VITAL SIGNS:  Temp 97.9, pulse 80, respirations 20, blood pressure  124/54.  HEENT:  Shows her to be normocephalic, atraumatic.  Sclerae clear.  Conjunctivae clear.  Pupils equal and reactive.  Dentition fair.  Mucous  membranes moist.  Voice normal.  NECK:  Anterior examination of the neck shows it to be symmetric.  Palpation shows no thyroid nodularity.  No lymphadenopathy.  No masses.  No tenderness.  LUNGS:  Clear to auscultation bilaterally without rales, rhonchi or  wheeze.  CARDIAC EXAM:  Shows a regular rate and rhythm without murmur.  Peripheral pulses are full.  ABDOMEN:  Soft.  There are bowel sounds present.  There may be mild  distention.  There are no surgical wounds.  There is tenderness to  palpation and percussion in the right lower quadrant with voluntary  guarding.  There is no mass.  There is no sign of hernia.  EXTREMITIES:  Nontender without edema.  NEUROLOGICALLY:  The patient is alert and oriented without focal  deficit.  Affect is somewhat inappropriate.  The patient is mildly  confused to some questioning.   LABORATORY STUDIES:  CBC shows white count 24.9, hemoglobin 12.8,  platelet count 203,000.  Differential shows 95%, neutrophils 1%  lymphocytes, 4% monocytes.  Chemistry profile shows sodium 132,  potassium 3.1, otherwise normal.  Urinalysis is notable for positive  nitrite, positive leukocyte esterase, with 11 to 20 white cells per high-  powered field.   RADIOGRAPHIC STUDIES:  CT scan abdomen and pelvis was reviewed and shows  findings consistent with acute appendicitis.  There is a small amount of  extraluminal air possibly representing perforation.  Appendix may be  located somewhat retrocecally.   IMPRESSION:  Acute appendicitis.   PLAN:  The patient will be admitted on the general surgical service.  She will start on intravenous antibiotics with Unasyn 3 g IV.  The  patient will be taken directly to the operating room for a laparoscopic  appendectomy.  Risk and benefits of the procedure were discussed with  the patient and her family members.  I quoted them approximately a 10%  chance of conversion to open surgery.  She will be in the hospital  approximately 48 hours for intravenous antibiotics unless there is a  perforation, and then her hospital stay will  likely be longer.  The  patient and her family understand and wish to proceed.  We will make  arrangements with the operating room immediately.      Velora Heckler, MD  Electronically Signed     TMG/MEDQ  D:  07/09/2006  T:  07/09/2006  Job:  [id number]   cc:   Gordy Savers, MD  [address] [place]  [place] [address]   Velora Heckler, MD  [address] [place]  [place] [address]

## 2010-08-12 NOTE — Discharge Summary (Signed)
NAMEKOREA, SEVERS NO.:  [id number]   MEDICAL RECORD NO.:  [id number]          PATIENT TYPE:  INP   LOCATION:  1343                         FACILITY:  [facility]   PHYSICIAN:  Velora Heckler, MD      DATE OF BIRTH:  [birth year]   DATE OF ADMISSION:  07/08/2006  DATE OF DISCHARGE:  07/10/2006                               DISCHARGE SUMMARY   REASON FOR ADMISSION:  Acute appendicitis.   BRIEF HISTORY:  Patient is an 77 year old white female who presented to  the emergency department at [facility] for 24-hour  history of abdominal pain, nausea, vomiting and chills.  The patient was  seen by the emergency department physician.  White blood cell count was  elevated a 24,000.  CT scan abdomen and pelvis showed acute appendicitis  with extraluminal air consistent with perforation.  General surgery was  contacted for management.   HOSPITAL COURSE:  The patient was admitted on the general surgical  service.  She was prepared and taken directly to the operating room  where she underwent laparoscopic appendectomy.  Postoperatively, she  received 36 hours of intravenous antibiotics.  She was switched to oral  antibiotics and oral pain medication and prepared for discharge home on  the afternoon of the first postoperative day.   DISCHARGE/PLAN:  The patient is discharged home today,  April15,2008, tolerating a regular diet and ambulating  independently.  The patient will be seen back in my office at [facility] in two weeks.   DISCHARGE MEDICATIONS:  1. Augmentin 875 mg p.o. b.i.d. for 1 week.  2. Vicodin as needed for pain.   FINAL DIAGNOSIS:  Acute appendicitis with perforation.   CONDITION AT DISCHARGE:  Good.      Velora Heckler, MD  Electronically Signed     TMG/MEDQ  D:  07/10/2006  T:  07/10/2006  Job:  [id number]   cc:   Gordy Savers, MD  [address] [place]  [place] [address]

## 2010-08-23 ENCOUNTER — Encounter (INDEPENDENT_AMBULATORY_CARE_PROVIDER_SITE_OTHER): Payer: Self-pay | Admitting: Surgery

## 2010-09-01 ENCOUNTER — Telehealth: Payer: Self-pay | Admitting: Internal Medicine

## 2010-09-01 MED ORDER — SOLIFENACIN SUCCINATE 5 MG PO TABS: 5.0000 mg | ORAL_TABLET | Freq: Every day | ORAL | Status: DC

## 2010-09-01 MED ORDER — DIPHENOXYLATE-ATROPINE 2.5-0.025 MG PO TABS: 1.0000 | ORAL_TABLET | Freq: Four times a day (QID) | ORAL | Status: DC | PRN

## 2010-09-01 NOTE — Telephone Encounter (Signed)
Pt was given samples of vesicare 5 mg and pt needs refill on generic lomotil both med please call  [facility] [address] [phone]

## 2010-09-01 NOTE — Telephone Encounter (Signed)
vesicare  #30 andRF 4  Lomotil  #30

## 2010-09-08 ENCOUNTER — Other Ambulatory Visit: Payer: Self-pay | Admitting: *Deleted

## 2010-09-08 MED ORDER — DIPHENOXYLATE-ATROPINE 2.5-0.025 MG PO TABS: 1.0000 | ORAL_TABLET | Freq: Every day | ORAL | Status: DC | PRN

## 2010-09-09 ENCOUNTER — Telehealth: Payer: Self-pay | Admitting: *Deleted

## 2010-09-09 MED ORDER — LOPERAMIDE HCL 2 MG PO TABS: 2.0000 mg | ORAL_TABLET | Freq: Three times a day (TID) | ORAL | Status: AC | PRN

## 2010-09-09 NOTE — Telephone Encounter (Signed)
Try Imodium OTC, 1 po tid prn diarrhea,

## 2010-09-09 NOTE — Telephone Encounter (Signed)
Dr Juanda Chance,  Patient's insurance company has denied a prior authorization request for patient's Lomotil for her occasional diarrhea. Would you like to try her on bentyl or levbid instead? Suggestions?

## 2010-09-09 NOTE — Telephone Encounter (Signed)
I have spoken to patient and advised her that her insurance has denied lomotil but that Dr Juanda Chance recommends she try Imodium tid prn. She verbalizes understanding.

## 2010-10-11 ENCOUNTER — Encounter: Payer: Self-pay | Admitting: Internal Medicine

## 2010-11-08 ENCOUNTER — Ambulatory Visit (INDEPENDENT_AMBULATORY_CARE_PROVIDER_SITE_OTHER): Payer: Medicare Other | Admitting: Internal Medicine

## 2010-11-08 DIAGNOSIS — Z0000 Encounter for general adult medical examination without abnormal findings: Secondary | ICD-10-CM

## 2010-11-08 DIAGNOSIS — Z2911 Encounter for prophylactic immunotherapy for respiratory syncytial virus (RSV): Secondary | ICD-10-CM

## 2010-11-29 ENCOUNTER — Other Ambulatory Visit: Payer: Self-pay | Admitting: Internal Medicine

## 2010-12-05 ENCOUNTER — Ambulatory Visit (INDEPENDENT_AMBULATORY_CARE_PROVIDER_SITE_OTHER): Payer: Medicare Other | Admitting: Internal Medicine

## 2010-12-05 ENCOUNTER — Other Ambulatory Visit: Payer: Self-pay | Admitting: Internal Medicine

## 2010-12-05 ENCOUNTER — Encounter: Payer: Self-pay | Admitting: Internal Medicine

## 2010-12-05 VITALS — BP 130/80 | Temp 98.0°F | Wt 161.0 lb

## 2010-12-05 DIAGNOSIS — Z8639 Personal history of other endocrine, nutritional and metabolic disease: Secondary | ICD-10-CM

## 2010-12-05 DIAGNOSIS — Z0000 Encounter for general adult medical examination without abnormal findings: Secondary | ICD-10-CM

## 2010-12-05 DIAGNOSIS — Z23 Encounter for immunization: Secondary | ICD-10-CM

## 2010-12-05 DIAGNOSIS — I10 Essential (primary) hypertension: Secondary | ICD-10-CM

## 2010-12-05 MED ORDER — ASPIRIN 81 MG PO TABS: 81.0000 mg | ORAL_TABLET | Freq: Every day | ORAL | Status: DC

## 2010-12-05 MED ORDER — ATENOLOL 50 MG PO TABS: 50.0000 mg | ORAL_TABLET | Freq: Every day | ORAL | Status: DC

## 2010-12-05 MED ORDER — POTASSIUM CHLORIDE CRYS ER 20 MEQ PO TBCR: 20.0000 meq | EXTENDED_RELEASE_TABLET | Freq: Every day | ORAL | Status: DC

## 2010-12-05 NOTE — Progress Notes (Signed)
  Subjective:    Patient ID: Karen [name], female    DOB: 11-11-1922, 77 y.o.   MRN: [id number]  HPI  77 year old patient who is seen today for followup. She has a history of treated hypertension. She has done quite well although complained of some fatigue. 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 No focal complaints. She requires a multiple medication refills. She denies any cardiopulmonary complaints    Review of Systems  Constitutional: Positive for fatigue.  HENT: Negative for hearing loss, congestion, sore throat, rhinorrhea, dental problem, sinus pressure and tinnitus.   Eyes: Negative for pain, discharge and visual disturbance.  Respiratory: Negative for cough and shortness of breath.   Cardiovascular: Negative for chest pain, palpitations and leg swelling.  Gastrointestinal: Negative for nausea, vomiting, abdominal pain, diarrhea, constipation, blood in stool and abdominal distention.  Genitourinary: Negative for dysuria, urgency, frequency, hematuria, flank pain, vaginal bleeding, vaginal discharge, difficulty urinating, vaginal pain and pelvic pain.  Musculoskeletal: Negative for joint swelling, arthralgias and gait problem.  Skin: Negative for rash.  Neurological: Negative for dizziness, syncope, speech difficulty, weakness, numbness and headaches.  Hematological: Negative for adenopathy.  Psychiatric/Behavioral: Negative for behavioral problems, dysphoric mood and agitation. The patient is not nervous/anxious.        Objective:   Physical Exam  Constitutional: She is oriented to person, place, and time. She appears well-developed and well-nourished.  HENT:  Head: Normocephalic.  Right Ear: External ear normal.  Left Ear: External ear normal.  Mouth/Throat: Oropharynx is clear and moist.  Eyes: Conjunctivae and EOM are normal. Pupils are equal, round, and reactive to light.  Neck: Normal range of motion. Neck supple. No thyromegaly present.    Cardiovascular: Normal rate, regular rhythm, normal heart sounds and intact distal pulses.   Pulmonary/Chest: Effort normal and breath sounds normal.  Abdominal: Soft. Bowel sounds are normal. She exhibits no mass. There is no tenderness.  Musculoskeletal: Normal range of motion.  Lymphadenopathy:    She has no cervical adenopathy.  Neurological: She is alert and oriented to person, place, and time.  Skin: Skin is warm and dry. No rash noted.  Psychiatric: She has a normal mood and affect. Her behavior is normal.          Assessment & Plan:  Hypertension stable. We'll refill her medications. Recheck in 6 months for her annual physical. Low-salt diet encouraged More regular exercise also encouraged

## 2010-12-05 NOTE — Patient Instructions (Signed)
Limit your sodium (Salt) intake    It is important that you exercise regularly, at least 20 minutes 3 to 4 times per week.  If you develop chest pain or shortness of breath seek  medical attention.  Return in 6 months for follow-up  

## 2010-12-13 ENCOUNTER — Ambulatory Visit (HOSPITAL_COMMUNITY)
Admission: RE | Admit: 2010-12-13 | Discharge: 2010-12-13 | Disposition: A | Discharge status: Home / Self Care | Payer: Medicare Other | Source: Ambulatory Visit | Attending: Oncology | Admitting: Oncology

## 2010-12-13 ENCOUNTER — Encounter (HOSPITAL_BASED_OUTPATIENT_CLINIC_OR_DEPARTMENT_OTHER): Payer: Medicare Other | Admitting: Oncology

## 2010-12-13 ENCOUNTER — Other Ambulatory Visit: Payer: Self-pay | Admitting: Oncology

## 2010-12-13 DIAGNOSIS — Q619 Cystic kidney disease, unspecified: Secondary | ICD-10-CM | POA: Insufficient documentation

## 2010-12-13 DIAGNOSIS — C189 Malignant neoplasm of colon, unspecified: Secondary | ICD-10-CM | POA: Insufficient documentation

## 2010-12-13 DIAGNOSIS — C180 Malignant neoplasm of cecum: Secondary | ICD-10-CM

## 2010-12-13 DIAGNOSIS — R599 Enlarged lymph nodes, unspecified: Secondary | ICD-10-CM | POA: Insufficient documentation

## 2010-12-13 DIAGNOSIS — K5730 Diverticulosis of large intestine without perforation or abscess without bleeding: Secondary | ICD-10-CM | POA: Insufficient documentation

## 2010-12-13 DIAGNOSIS — K7689 Other specified diseases of liver: Secondary | ICD-10-CM | POA: Insufficient documentation

## 2010-12-13 DIAGNOSIS — K5710 Diverticulosis of small intestine without perforation or abscess without bleeding: Secondary | ICD-10-CM | POA: Insufficient documentation

## 2010-12-13 LAB — CBC WITH DIFFERENTIAL/PLATELET
Basophils Absolute: 0 10*3/uL (ref 0.0–0.1)
EOS%: 1.4 % (ref 0.0–7.0)
HCT: 40.8 % (ref 34.8–46.6)
HGB: 13.8 g/dL (ref 11.6–15.9)
MCH: 29.9 pg (ref 25.1–34.0)
MCV: 88.2 fL (ref 79.5–101.0)
MONO%: 8.9 % (ref 0.0–14.0)
NEUT%: 66.7 % (ref 38.4–76.8)
Platelets: 233 10*3/uL (ref 145–400)

## 2010-12-13 LAB — CMP (CANCER CENTER ONLY)
Albumin: 3.3 g/dL (ref 3.3–5.5)
Alkaline Phosphatase: 66 U/L (ref 26–84)
BUN, Bld: 15 mg/dL (ref 7–22)
Calcium: 9.1 mg/dL (ref 8.0–10.3)
Chloride: 102 mEq/L (ref 98–108)
Glucose, Bld: 100 mg/dL (ref 73–118)
Potassium: 4 mEq/L (ref 3.3–4.7)
Sodium: 145 mEq/L (ref 128–145)
Total Protein: 7.3 g/dL (ref 6.4–8.1)

## 2010-12-15 ENCOUNTER — Encounter (HOSPITAL_BASED_OUTPATIENT_CLINIC_OR_DEPARTMENT_OTHER): Payer: Medicare Other | Admitting: Oncology

## 2010-12-15 DIAGNOSIS — C180 Malignant neoplasm of cecum: Secondary | ICD-10-CM

## 2010-12-15 DIAGNOSIS — R599 Enlarged lymph nodes, unspecified: Secondary | ICD-10-CM

## 2010-12-17 ENCOUNTER — Other Ambulatory Visit: Payer: Self-pay | Admitting: Internal Medicine

## 2010-12-26 ENCOUNTER — Other Ambulatory Visit: Payer: Self-pay | Admitting: Internal Medicine

## 2011-02-20 ENCOUNTER — Encounter (HOSPITAL_COMMUNITY): Payer: Self-pay

## 2011-02-20 ENCOUNTER — Emergency Department (INDEPENDENT_AMBULATORY_CARE_PROVIDER_SITE_OTHER)
Admission: EM | Admit: 2011-02-20 | Discharge: 2011-02-20 | Disposition: A | Discharge status: Home / Self Care | Payer: Medicare Other | Source: Home / Self Care

## 2011-02-20 DIAGNOSIS — N3000 Acute cystitis without hematuria: Secondary | ICD-10-CM

## 2011-02-20 LAB — POCT URINALYSIS DIP (DEVICE)
Bilirubin Urine: NEGATIVE
Glucose, UA: NEGATIVE mg/dL
Hgb urine dipstick: NEGATIVE
Leukocytes, UA: NEGATIVE
Nitrite: POSITIVE — AB
Urobilinogen, UA: 0.2 mg/dL (ref 0.0–1.0)
pH: 5.5 (ref 5.0–8.0)

## 2011-02-20 MED ORDER — SULFAMETHOXAZOLE-TRIMETHOPRIM 800-160 MG PO TABS: 1.0000 | ORAL_TABLET | Freq: Two times a day (BID) | ORAL | Status: AC

## 2011-02-20 NOTE — ED Notes (Signed)
C/o pressure in bladder area since this AM; c/o nausea (no vomiting)

## 2011-02-20 NOTE — ED Provider Notes (Signed)
History     CSN: [id number] Arrival date & time: 02/20/2011  4:07 PM   None     Chief Complaint  Patient presents with  . Urinary Tract Infection    (Consider location/radiation/quality/duration/timing/severity/associated sxs/prior treatment) HPI Comments: Pt states that when she awoke this morning she felt lower abdominal pressure. This has since improved. She also was having difficulty urinating this morning, but now is able to void without difficulty or discomfort. She states she has decreased appetite and mild nausea today also. No fever or chills.   Patient is a 77 y.o. female presenting with urinary tract infection. The history is provided by the patient.  Urinary Tract Infection The current episode started 6 to 12 hours ago. The problem has been gradually improving. Pertinent negatives include no chest pain, no abdominal pain, no headaches and no shortness of breath. The symptoms are aggravated by nothing. The symptoms are relieved by nothing. She has tried nothing for the symptoms.    Past Medical History  Diagnosis Date  . ANEMIA 10/21/2008  . AORTIC INSUFFICIENCY 11/26/2009  . BACK PAIN 06/27/2007  . COLON CANCER, PERSONAL HX 02/25/2010  . Diverticulosis of colon (without mention of hemorrhage) 08/14/2006  . GALLSTONES 10/09/2008  . GLAUCOMA NOS 08/14/2006  . HYPERTENSION 08/14/2006  . HYPOKALEMIA, HX OF 11/26/2009  . MELENA, HX OF 10/12/2008  . NEOPLASM, MALIGNANT, COLON, CECUM 10/30/2008  . OCCLUSION, CENTRAL RETINAL ARTERY 08/14/2006  . PERSONAL HX COLONIC POLYPS 10/21/2008  . SYNCOPE, HX OF 08/14/2006  . URI 09/07/2008    Past Surgical History  Procedure Date  . Appendectomy   . Cataract extraction   . Dilation and curettage of uterus   . Tonsillectomy   . Colon surgery     11/20/2008    Family History  Problem Relation Age of Onset  . Cancer Neg Hx     family hx lung ca  . Stroke Neg Hx     family hx   . Heart disease Neg Hx     family hx    History  Substance  Use Topics  . Smoking status: Never Smoker   . Smokeless tobacco: Never Used  . Alcohol Use: No    OB History    Grav Para Term Preterm Abortions TAB SAB Ect Mult Living                  Review of Systems  Constitutional: Positive for appetite change. Negative for fever and chills.  Respiratory: Negative for cough and shortness of breath.   Cardiovascular: Negative for chest pain.  Gastrointestinal: Positive for nausea. Negative for vomiting, abdominal pain, diarrhea and constipation.  Genitourinary: Positive for difficulty urinating. Negative for dysuria, urgency, frequency and decreased urine volume.  Neurological: Negative for headaches.    Allergies  Amoxicillin; Ciprofloxacin; and Methocarbamol  Home Medications   Current Outpatient Rx  Name Route Sig Dispense Refill  . ASPIRIN 81 MG PO TABS Oral Take 1 tablet (81 mg total) by mouth daily. 90 tablet 6  . ATENOLOL 50 MG PO TABS  TAKE 1 TABLET BY MOUTH ONCE A DAY 100 tablet 3  . BRIMONIDINE TARTRATE 0.15 % OP SOLN  As directed     . CALCIUM CARBONATE 600 MG PO TABS Oral Take 600 mg by mouth daily.      Marland Kitchen DIPHENHYDRAMINE-APAP (SLEEP) 25-500 MG PO TABS Oral Take 1 tablet by mouth at bedtime as needed.      . DORZOLAMIDE HCL-TIMOLOL MAL 22.3-6.8 MG/ML OP SOLN      .  LATANOPROST 0.005 % OP SOLN  As directed     . LUMIGAN 0.01 % OP SOLN      . POTASSIUM CHLORIDE CRYS CR 20 MEQ PO TBCR Oral Take 1 tablet (20 mEq total) by mouth daily. 90 tablet 3  . SULFAMETHOXAZOLE-TRIMETHOPRIM 800-160 MG PO TABS Oral Take 1 tablet by mouth every 12 (twelve) hours. 14 tablet 0    BP 188/73  Pulse 70  Temp(Src) 97.8 F (36.6 C) (Oral)  Resp 16  SpO2 100%  Physical Exam  Nursing note and vitals reviewed. Constitutional: She appears well-developed and well-nourished. No distress.  Cardiovascular: Normal rate, regular rhythm and normal heart sounds.   Pulmonary/Chest: Effort normal and breath sounds normal. No respiratory distress.    Abdominal: Soft. Bowel sounds are normal. She exhibits no distension and no mass. There is tenderness (mild suprapubic "pressure" with palpation). There is no rebound and no guarding.  Neurological: She is alert.  Skin: Skin is warm and dry. No rash noted.  Psychiatric: She has a normal mood and affect.    ED Course  Procedures (including critical care time)  Labs Reviewed  POCT URINALYSIS DIP (DEVICE) - Abnormal; Notable for the following:    Nitrite POSITIVE (*)    All other components within normal limits  POCT URINALYSIS DIPSTICK  URINE CULTURE   No results found.   1. Cystitis, acute       MDM  UA pos nitrite.         Melody Comas, [place] 02/20/11 1701

## 2011-02-21 ENCOUNTER — Telehealth (HOSPITAL_COMMUNITY): Payer: Self-pay | Admitting: *Deleted

## 2011-02-23 ENCOUNTER — Ambulatory Visit (INDEPENDENT_AMBULATORY_CARE_PROVIDER_SITE_OTHER): Payer: Medicare Other | Admitting: Family Medicine

## 2011-02-23 ENCOUNTER — Encounter: Payer: Self-pay | Admitting: Family Medicine

## 2011-02-23 DIAGNOSIS — R1032 Left lower quadrant pain: Secondary | ICD-10-CM

## 2011-02-23 DIAGNOSIS — R112 Nausea with vomiting, unspecified: Secondary | ICD-10-CM

## 2011-02-23 DIAGNOSIS — C180 Malignant neoplasm of cecum: Secondary | ICD-10-CM

## 2011-02-23 LAB — CBC WITH DIFFERENTIAL/PLATELET
Basophils Relative: 0.2 % (ref 0.0–3.0)
Eosinophils Relative: 1.2 % (ref 0.0–5.0)
HCT: 43.1 % (ref 36.0–46.0)
Hemoglobin: 14.5 g/dL (ref 12.0–15.0)
Lymphs Abs: 1.1 10*3/uL (ref 0.7–4.0)
MCHC: 33.7 g/dL (ref 30.0–36.0)
MCV: 90.3 fl (ref 78.0–100.0)
Monocytes Absolute: 1 10*3/uL (ref 0.1–1.0)
Neutro Abs: 6.7 10*3/uL (ref 1.4–7.7)
RBC: 4.77 Mil/uL (ref 3.87–5.11)
WBC: 8.9 10*3/uL (ref 4.5–10.5)

## 2011-02-23 LAB — BASIC METABOLIC PANEL
CO2: 27 mEq/L (ref 19–32)
Chloride: 100 mEq/L (ref 96–112)
Potassium: 5.2 mEq/L — ABNORMAL HIGH (ref 3.5–5.1)

## 2011-02-23 LAB — URINE CULTURE

## 2011-02-23 NOTE — ED Provider Notes (Signed)
Medical screening examination/treatment/procedure(s) were performed by non-physician practitioner and as supervising physician I was immediately available for consultation/collaboration.   [facility]; MD   Sharin Grave, MD 02/23/11 1501

## 2011-02-23 NOTE — Patient Instructions (Signed)
Follow up promptly for any fever, worsening abdominal, or recurrent vomiting.

## 2011-02-23 NOTE — Progress Notes (Signed)
Subjective:    Patient ID: Karen [name], female    DOB: 07/02/1922, 77 y.o.   MRN: [id number]  HPI  Followup lower abdominal pain. Onset this past Monday. Symptoms are somewhat suprapubic and bilateral. Patient went to urgent care after a couple episodes of vomiting. Urine suspicious for UTI. No blood work done. Patient started Septra DS one twice daily for possible UTI. Urine culture gram-negative rods but no ID yet. Overall feels slightly improved but still has some mild lower abdominal pressure possibly worse left-sided. Previous history of appendectomy as well as cholecystectomy. Colon cancer about 2 years ago treated with resection. CEA level normal this past September. Patient denies any diarrhea. No bloody stools. She had one episode of vomiting yesterday and had a couple of episodes past couple days. No clear triggering factors. No history of bowel obstruction. No abdominal distention.  Patient denies hematuria. No history of kidney stones. Questionable history of diverticulitis.  Past Medical History  Diagnosis Date  . ANEMIA 10/21/2008  . AORTIC INSUFFICIENCY 11/26/2009  . BACK PAIN 06/27/2007  . COLON CANCER, PERSONAL HX 02/25/2010  . Diverticulosis of colon (without mention of hemorrhage) 08/14/2006  . GALLSTONES 10/09/2008  . GLAUCOMA NOS 08/14/2006  . HYPERTENSION 08/14/2006  . HYPOKALEMIA, HX OF 11/26/2009  . MELENA, HX OF 10/12/2008  . NEOPLASM, MALIGNANT, COLON, CECUM 10/30/2008  . OCCLUSION, CENTRAL RETINAL ARTERY 08/14/2006  . PERSONAL HX COLONIC POLYPS 10/21/2008  . SYNCOPE, HX OF 08/14/2006  . URI 09/07/2008   Past Surgical History  Procedure Date  . Appendectomy   . Cataract extraction   . Dilation and curettage of uterus   . Tonsillectomy   . Colon surgery     11/20/2008    reports that she has never smoked. She has never used smokeless tobacco. She reports that she does not drink alcohol or use illicit drugs. family history is negative for Cancer, and Stroke, and Heart  disease, . Allergies  Allergen Reactions  . Amoxicillin     REACTION: unspecified  . Ciprofloxacin   . Methocarbamol     REACTION: generalized rash and itching      Review of Systems  Constitutional: Positive for fever and chills.  HENT: Negative for sore throat.   Respiratory: Negative for cough.   Cardiovascular: Negative for chest pain.  Gastrointestinal: Positive for nausea, vomiting and abdominal pain. Negative for diarrhea, constipation, blood in stool, abdominal distention and anal bleeding.  Genitourinary: Negative for dysuria and hematuria.  Neurological: Negative for headaches.       Objective:   Physical Exam  Constitutional: She is oriented to person, place, and time. She appears well-developed and well-nourished. No distress.  HENT:  Mouth/Throat: Oropharynx is clear and moist.  Neck: Neck supple.  Cardiovascular: Normal rate and regular rhythm.   Pulmonary/Chest: Effort normal and breath sounds normal. No respiratory distress. She has no wheezes. She has no rales.  Abdominal: Soft. Bowel sounds are normal. She exhibits no distension and no mass. There is no rebound and no guarding.       Minimally tender left lower quadrant to deep palpation. No guarding or rebound.  Musculoskeletal: She exhibits no edema.  Lymphadenopathy:    She has no cervical adenopathy.  Neurological: She is alert and oriented to person, place, and time.          Assessment & Plan:  Abdominal pain/pelvic pain mostly left lower quadrant. Probable UTI currently treated with Septra. Final identification pending. Given persistent nausea and vomiting check electrolytes  and CBC. Consider CT abdomen pelvis though she is medically improved at this time. Followup immediately for any fever or worsening symptoms.  No evidence clinically for bowel obstruction.

## 2011-02-24 ENCOUNTER — Telehealth: Payer: Self-pay | Admitting: Family Medicine

## 2011-02-24 NOTE — Telephone Encounter (Signed)
Pt informed, she is feeling better, reports no fever, will finish Cepra

## 2011-02-24 NOTE — Telephone Encounter (Signed)
Positive urine cx for Klebsiella.  This was sensitive to Septra. Make sure pt with no fever and continuing to improve.

## 2011-02-24 NOTE — Progress Notes (Signed)
Quick Note:  Pt informed ______ 

## 2011-02-24 NOTE — ED Notes (Signed)
Urine culture: >100,000 colonies Klebsiella Pneumoniae. Pt. adequately treated with Septra. Vassie Moselle 02/24/2011

## 2011-02-24 NOTE — Telephone Encounter (Signed)
Message copied by Kristian Covey on Fri Feb 24, 2011  8:18 AM ------      Message from: Kristian Covey      Created: Thu Feb 23, 2011  7:54 PM       Check urine cx result

## 2011-03-13 ENCOUNTER — Telehealth: Payer: Self-pay | Admitting: Internal Medicine

## 2011-03-13 MED ORDER — HYDROXYZINE HCL 25 MG PO TABS: 25.0000 mg | ORAL_TABLET | Freq: Four times a day (QID) | ORAL | Status: AC | PRN

## 2011-03-13 NOTE — Telephone Encounter (Signed)
Spoke with pt- UTI 2wks ago - med finished 1week ago - itching x1week- all over , no rash , benadryl at night not really helping - please advise

## 2011-03-13 NOTE — Telephone Encounter (Signed)
geeneric atatax 25 mg  #50 one every 4-6 hrs as needed

## 2011-03-13 NOTE — Telephone Encounter (Signed)
Pt was in for a bladder infection at the urgent care and believes she has had a reaction from the medication and is itching badly. Pt requesting rx be called in for her. Please  Contact pt/  [facility] [address]

## 2011-03-13 NOTE — Telephone Encounter (Signed)
Spoke with pt- aware of [facility]

## 2011-03-13 NOTE — Telephone Encounter (Signed)
What medication is she presently taking

## 2011-03-13 NOTE — Telephone Encounter (Signed)
She is currently done with abx x6days

## 2011-04-18 ENCOUNTER — Encounter (INDEPENDENT_AMBULATORY_CARE_PROVIDER_SITE_OTHER): Payer: Self-pay | Admitting: Surgery

## 2011-04-20 ENCOUNTER — Encounter (INDEPENDENT_AMBULATORY_CARE_PROVIDER_SITE_OTHER): Payer: Self-pay | Admitting: Surgery

## 2011-04-20 ENCOUNTER — Ambulatory Visit (INDEPENDENT_AMBULATORY_CARE_PROVIDER_SITE_OTHER): Payer: Medicare Other | Admitting: Surgery

## 2011-04-20 VITALS — BP 151/88 | HR 80 | Resp 18

## 2011-04-20 DIAGNOSIS — Z85038 Personal history of other malignant neoplasm of large intestine: Secondary | ICD-10-CM

## 2011-04-20 NOTE — Patient Instructions (Signed)
Consider follow up with Dr. Juanda Chance regarding loose BM's.  Consider follow up colonoscopy this year.  tmg

## 2011-04-20 NOTE — Progress Notes (Signed)
Visit Diagnoses: 1. COLON CANCER, PERSONAL HX     HISTORY: Patient is an 78 year old white female who is now 2-1/2 years out for a right colectomy for adenocarcinoma of the colon. Followup has showed no sign of recurrent disease. She did have a CT scan of the chest abdomen and pelvis this past year which was negative. To my knowledge she has not had a followup colonoscopy since surgery.  PERTINENT REVIEW OF SYSTEMS: Patient notes loose bowel movements 2-3 times each morning. Denies bleeding per rectum. Denies abdominal pain.  EXAM: HEENT: normocephalic; pupils equal and reactive; sclerae clear; dentition good; mucous membranes moist NECK:  symmetric on extension; no palpable anterior or posterior cervical lymphadenopathy; no supraclavicular masses; no tenderness CHEST: clear to auscultation bilaterally without rales, rhonchi, or wheezes CARDIAC: regular rate and rhythm without significant murmur; peripheral pulses are full ABDOMEN: Soft without distension; no mass; no hepatosplenomegaly; incision well healed without hernia EXT:  non-tender without edema; no deformity NEURO: no gross focal deficits; no sign of tremor   IMPRESSION: Personal history of adenocarcinoma of the colon, no evidence of recurrent disease, no complications  PLAN: Patient is released from surgical followup at this time. I have recommended that she see Dr. Lina Sar regarding loose bowel movements and possibly a followup colonoscopy.  Patient will return to see me as needed.  Velora Heckler, MD, FACS General & Endocrine Surgery [facility]

## 2011-04-21 ENCOUNTER — Telehealth: Payer: Self-pay | Admitting: *Deleted

## 2011-04-21 NOTE — Telephone Encounter (Signed)
I have spoken to patient. I offered her an appointment for 04/25/11, but she was unable to come on that day. She has instead been scheduled for an appointment on 05/03/11. Patient verbalizes understanding.

## 2011-04-21 NOTE — Telephone Encounter (Signed)
Message copied by Richardson Chiquito on Fri Apr 21, 2011  8:05 AM ------      Message from: Hart Carwin      Created: Thu Apr 20, 2011  9:06 PM      Regarding: RE: follow up colonoscopy       Dottie, can You, please arrange for an OV  In next few weeks. Thanx      ----- Message -----         From: Velora Heckler, MD         Sent: 04/20/2011   7:34 PM           To: Hart Carwin, MD      Subject: RE: follow up colonoscopy                                Dora:      Thanks for your prompt note!  The patient did not remember having had a colonoscopy since surgery.  Glad it was done.  Would you please arrange to see her about diarrhea.  It is a real issue for her, and I told her there was probably some simple remedies that you could recommend.      Thanks,      Darnell Level            ----- Message -----         From: Hart Carwin, MD         Sent: 04/20/2011   7:10 PM           To: Velora Heckler, MD      Subject: follow up colonoscopy                                    Hello Tawanna Cooler, thank You for sending me your OV report on Ms Sprecher. She had a colonoscopy on 03/30/2010 which did not show any evidence of recurrent cancer or polyps. She has a functioning ileocolic anastomosis as per your excellent work. You can find the report under Chart review/Procedure/Endoscopies, then click on Scanned procedure. Her Path report is under Labs from 03/30/2010 Surgical Pathology. She is for recall colon for 03/2012 if she is in good enough health. I will see her for her diarrhea if she calls for appointment, or should we call her??. Thanx for your care. Dora B.

## 2011-04-24 ENCOUNTER — Telehealth: Payer: Self-pay | Admitting: Internal Medicine

## 2011-04-24 DIAGNOSIS — H04129 Dry eye syndrome of unspecified lacrimal gland: Secondary | ICD-10-CM | POA: Diagnosis not present

## 2011-04-24 DIAGNOSIS — H43399 Other vitreous opacities, unspecified eye: Secondary | ICD-10-CM | POA: Diagnosis not present

## 2011-04-24 DIAGNOSIS — H4011X0 Primary open-angle glaucoma, stage unspecified: Secondary | ICD-10-CM | POA: Diagnosis not present

## 2011-04-24 DIAGNOSIS — H353190 Nonexudative age-related macular degeneration, unspecified eye, stage unspecified: Secondary | ICD-10-CM | POA: Diagnosis not present

## 2011-04-24 NOTE — Telephone Encounter (Signed)
Pt r.s to 05/17/2011 @ 10:15am

## 2011-04-25 ENCOUNTER — Ambulatory Visit: Payer: Medicare Other | Admitting: Internal Medicine

## 2011-04-29 ENCOUNTER — Telehealth: Payer: Self-pay | Admitting: Oncology

## 2011-04-29 NOTE — Telephone Encounter (Signed)
called pt with appt for 06/29/11 per pof fro 12/15/10  aom

## 2011-05-03 ENCOUNTER — Ambulatory Visit: Payer: Medicare Other | Admitting: Internal Medicine

## 2011-05-09 ENCOUNTER — Encounter: Payer: Self-pay | Admitting: *Deleted

## 2011-05-17 ENCOUNTER — Ambulatory Visit: Payer: Medicare Other | Admitting: Internal Medicine

## 2011-05-18 DIAGNOSIS — H10019 Acute follicular conjunctivitis, unspecified eye: Secondary | ICD-10-CM | POA: Diagnosis not present

## 2011-05-18 DIAGNOSIS — H409 Unspecified glaucoma: Secondary | ICD-10-CM | POA: Diagnosis not present

## 2011-05-18 DIAGNOSIS — H4011X0 Primary open-angle glaucoma, stage unspecified: Secondary | ICD-10-CM | POA: Diagnosis not present

## 2011-06-15 ENCOUNTER — Ambulatory Visit: Payer: Medicare Other | Admitting: Oncology

## 2011-06-15 ENCOUNTER — Other Ambulatory Visit: Payer: Medicare Other | Admitting: Lab

## 2011-06-15 DIAGNOSIS — H409 Unspecified glaucoma: Secondary | ICD-10-CM | POA: Diagnosis not present

## 2011-06-15 DIAGNOSIS — H4011X0 Primary open-angle glaucoma, stage unspecified: Secondary | ICD-10-CM | POA: Diagnosis not present

## 2011-06-20 ENCOUNTER — Ambulatory Visit (INDEPENDENT_AMBULATORY_CARE_PROVIDER_SITE_OTHER): Payer: Medicare Other | Admitting: Internal Medicine

## 2011-06-20 ENCOUNTER — Encounter: Payer: Self-pay | Admitting: Internal Medicine

## 2011-06-20 VITALS — BP 142/90 | HR 72 | Temp 97.8°F | Resp 18 | Ht 65.5 in | Wt 159.0 lb

## 2011-06-20 DIAGNOSIS — I359 Nonrheumatic aortic valve disorder, unspecified: Secondary | ICD-10-CM | POA: Diagnosis not present

## 2011-06-20 DIAGNOSIS — I10 Essential (primary) hypertension: Secondary | ICD-10-CM | POA: Diagnosis not present

## 2011-06-20 DIAGNOSIS — M549 Dorsalgia, unspecified: Secondary | ICD-10-CM

## 2011-06-20 DIAGNOSIS — I351 Nonrheumatic aortic (valve) insufficiency: Secondary | ICD-10-CM

## 2011-06-20 DIAGNOSIS — Z0000 Encounter for general adult medical examination without abnormal findings: Secondary | ICD-10-CM | POA: Diagnosis not present

## 2011-06-20 MED ORDER — ATENOLOL 50 MG PO TABS: 50.0000 mg | ORAL_TABLET | Freq: Every day | ORAL | Status: DC

## 2011-06-20 NOTE — Patient Instructions (Signed)
Take a calcium supplement, plus 800-1200 units of vitamin D    It is important that you exercise regularly, at least 20 minutes 3 to 4 times per week.  If you develop chest pain or shortness of breath seek  medical attention.  Limit your sodium (Salt) intake  Return in 6 months for follow-up  

## 2011-06-20 NOTE — Progress Notes (Signed)
Subjective:    Patient ID: Karen [name], female    DOB: 1923/02/09, 78 y.o.   MRN: [id number]  HPI  78 year old patient who is in today for a preventive health examination. Medical problems include a history of hypertension she has been no well-controlled on atenolol 50 mg daily. She has been released from general surgery following a partial colectomy for colon cancer. She is scheduled to see GI syndrome. She continues to have some diarrhea. Otherwise done quite well although under considerable stress can for the health of her elderly disabled husband. She is followed by ophthalmology due to glaucoma. Laboratory studies were done in the late fall and were reviewed.  1. Risk factors, based on past  M,S,F history- cardiovascular risk factors include age and hypertension she does have a history of mild aortic insufficiency EKG today normal 2.  Physical activities: No major exercise limitations  3.  Depression/mood: No history of depression or mood disorder  4.  Hearing: No significant deficits  5.  ADL's: Independent in all aspects of daily living. Cares for a disabled husband  6.  Fall risk: Moderately to age  64.  Home safety: No problems identified  8.  Height weight, and visual acuity; height and weight stable no change in visual acuity. Does have a history of glaucoma followed closely by ophthalmology  9.  Counseling: Follow GI calcium and vitamin D supplements encouraged  10. Lab orders based on risk factors: Not appropriate at this time  11. Referral : Follow GI  12. Care plan: We'll add calcium and vitamin D supplements. More regular exercise encouraged  13. Cognitive assessment: Alert and appropriate normal affect. No cognitive dysfunction  Past Medical History  Diagnosis Date  . ANEMIA 10/21/2008  . AORTIC INSUFFICIENCY 11/26/2009  . BACK PAIN 06/27/2007  . COLON CANCER, PERSONAL HX 02/25/2010  . Diverticulosis of colon (without mention of hemorrhage) 08/14/2006  . GALLSTONES  10/09/2008  . GLAUCOMA NOS 08/14/2006  . HYPERTENSION 08/14/2006  . HYPOKALEMIA, HX OF 11/26/2009  . MELENA, HX OF 10/12/2008  . NEOPLASM, MALIGNANT, COLON, CECUM 10/30/2008  . OCCLUSION, CENTRAL RETINAL ARTERY 08/14/2006  . PERSONAL HX COLONIC POLYPS 10/21/2008  . SYNCOPE, HX OF 08/14/2006  . URI 09/07/2008    History   Social History  . Marital Status: Married    Spouse Name: N/A    Number of Children: N/A  . Years of Education: N/A   Occupational History  . Not on file.   Social History Main Topics  . Smoking status: Never Smoker   . Smokeless tobacco: Never Used  . Alcohol Use: No  . Drug Use: No  . Sexually Active: Not on file   Other Topics Concern  . Not on file   Social History Narrative  . No narrative on file    Past Surgical History  Procedure Date  . Appendectomy   . Cataract extraction   . Dilation and curettage of uterus   . Tonsillectomy   . Colon surgery     11/20/2008  . Ankle fracture surgery 2011    right    Family History  Problem Relation Age of Onset  . Lung cancer Neg Hx   . Stroke Neg Hx   . Heart disease Neg Hx     Allergies  Allergen Reactions  . Amoxicillin     REACTION: unspecified  . Ciprofloxacin   . Methocarbamol     REACTION: generalized rash and itching  . Sulfa Antibiotics Itching  Current Outpatient Prescriptions on File Prior to Visit  Medication Sig Dispense Refill  . aspirin 81 MG tablet Take 1 tablet (81 mg total) by mouth daily.  90 tablet  6  . atenolol (TENORMIN) 50 MG tablet TAKE 1 TABLET BY MOUTH ONCE A DAY  100 tablet  3  . brimonidine (ALPHAGAN P) 0.15 % ophthalmic solution As directed       . calcium carbonate (OS-CAL) 600 MG TABS Take 600 mg by mouth daily.        . diphenhydramine-acetaminophen (TYLENOL PM) 25-500 MG TABS Take 1 tablet by mouth at bedtime as needed.        . dorzolamide-timolol (COSOPT) 22.3-6.8 MG/ML ophthalmic solution       . latanoprost (XALATAN) 0.005 % ophthalmic solution As directed        . LUMIGAN 0.01 % SOLN       . potassium chloride SA (KLOR-CON M20) 20 MEQ tablet Take 1 tablet (20 mEq total) by mouth daily.  90 tablet  3    BP 142/90  Pulse 72  Temp(Src) 97.8 F (36.6 C) (Oral)  Resp 18  Ht 5' 5.5" (1.664 m)  Wt 159 lb (72.122 kg)  BMI 26.06 kg/m2  SpO2 97%       Allergies:  1) ! Cipro (Ciprofloxacin Hcl)  2) Amoxicillin (Amoxicillin)   Past History:  Past Medical History:  Diverticulosis, colon  Hypertension  Glaucoma  history of right central retinal artery occlusion  history of syncope 2001  COLON POLYPS 2001  cecal cancer   Past Surgical History:  Appendectomy  Cataract extraction  D&C 1997  Colon polypectomy  Tonsillectomy  colonoscopy September 2006  normal bone density study 2003  colonoscopy August 2010   Family History:  Reviewed history from 10/31/2007 and no changes required.  Family History Lung cancer  Family History of Stroke F 1st degree relative <60  Family History of Cardiovascular disorder  father died age 71 MI  mother died age 73, CVA  One brother died lung cancer  4 sisters; one died age 21, cerebral hemorrhage; one died age 58, senile dementia  Social History:   Reviewed history from 06/27/2007 and no changes required.  Retired  Married   Review of Systems  Constitutional: Negative.   HENT: Negative for hearing loss, congestion, sore throat, rhinorrhea, dental problem, sinus pressure and tinnitus.   Eyes: Negative for pain, discharge and visual disturbance.  Respiratory: Negative for cough and shortness of breath.   Cardiovascular: Negative for chest pain, palpitations and leg swelling.  Gastrointestinal: Positive for diarrhea. Negative for nausea, vomiting, abdominal pain, constipation, blood in stool and abdominal distention.  Genitourinary: Negative for dysuria, urgency, frequency, hematuria, flank pain, vaginal bleeding, vaginal discharge, difficulty urinating, vaginal pain and pelvic pain.    Musculoskeletal: Negative for joint swelling, arthralgias and gait problem.  Skin: Negative for rash.  Neurological: Negative for dizziness, syncope, speech difficulty, weakness, numbness and headaches.  Hematological: Negative for adenopathy.  Psychiatric/Behavioral: Negative for behavioral problems, dysphoric mood and agitation. The patient is not nervous/anxious.        Objective:   Physical Exam  Constitutional: She is oriented to person, place, and time. She appears well-developed and well-nourished.  HENT:  Head: Normocephalic and atraumatic.  Right Ear: External ear normal.  Left Ear: External ear normal.  Mouth/Throat: Oropharynx is clear and moist.  Eyes: Conjunctivae and EOM are normal.  Neck: Normal range of motion. Neck supple. No JVD present. No thyromegaly present.  Cardiovascular:  Normal rate, regular rhythm and intact distal pulses.   Murmur heard.      Grade 1/6 systolic murmur heard at the base. No murmur of aortic insufficiency noted  Diminished right posterior tibial pulse  Pulmonary/Chest: Effort normal and breath sounds normal. She has no wheezes. She has no rales.  Abdominal: Soft. Bowel sounds are normal. She exhibits no distension and no mass. There is no tenderness. There is no rebound and no guarding.  Genitourinary: Vagina normal.  Musculoskeletal: Normal range of motion. She exhibits no edema and no tenderness.  Neurological: She is alert and oriented to person, place, and time. She has normal reflexes. No cranial nerve deficit. She exhibits normal muscle tone. Coordination normal.  Skin: Skin is warm and dry. No rash noted.  Psychiatric: She has a normal mood and affect. Her behavior is normal.          Assessment & Plan:   Preventive health examination Hypertension well controlled Status post partial colectomy for cecal cancer Glaucoma  Medical regimen unchanged. Recheck in 6 months Encourage more regular exercise calcium and vitamin D  supplements encouraged

## 2011-06-27 ENCOUNTER — Encounter: Payer: Self-pay | Admitting: Internal Medicine

## 2011-06-27 ENCOUNTER — Ambulatory Visit (INDEPENDENT_AMBULATORY_CARE_PROVIDER_SITE_OTHER): Payer: Medicare Other | Admitting: Internal Medicine

## 2011-06-27 VITALS — BP 132/68 | HR 68 | Ht 65.0 in | Wt 160.0 lb

## 2011-06-27 DIAGNOSIS — T8189XA Other complications of procedures, not elsewhere classified, initial encounter: Secondary | ICD-10-CM

## 2011-06-27 DIAGNOSIS — Z85048 Personal history of other malignant neoplasm of rectum, rectosigmoid junction, and anus: Secondary | ICD-10-CM | POA: Diagnosis not present

## 2011-06-27 MED ORDER — COLESTIPOL HCL 1 G PO TABS: ORAL_TABLET | ORAL | Status: DC

## 2011-06-27 MED ORDER — LOPERAMIDE HCL 2 MG PO TABS: 2.0000 mg | ORAL_TABLET | Freq: Every day | ORAL | Status: AC

## 2011-06-27 MED ORDER — METRONIDAZOLE 250 MG PO TABS: 250.0000 mg | ORAL_TABLET | Freq: Two times a day (BID) | ORAL | Status: AC

## 2011-06-27 NOTE — Patient Instructions (Signed)
We have sent the following medications to your pharmacy for you to pick up at your convenience: Colestid Flagyl We have given you samples of Align. This puts good bacteria back into your intestines. You should take 1 capsule by mouth once daily. If this works well for you, it can be purchased over the counter. Please purchase Imodium over the counter and take once every night. You will be due for a recall colonoscopy in 03/2011. We will send you a reminder in the mail when it gets closer to that time. CC: Dr Lesia Hausen

## 2011-06-27 NOTE — Progress Notes (Signed)
Karen [name] Jul 19, 1922 MRN [id number]    History of Present Illness:  This is an 78 year old white female with chronic diarrhea since her right hemicolectomy in September 2010 for adenocarcinoma of the right colon. She had prior colonoscopies and colon polypectomies in 1988, 2001 in 2006. A colonoscopy in January 2012 showed a widely patent ileocolic anastomosis , internal hemorrhoids, and mild diverticulosis of the left colon. She has intermittent urgency and incontinence but denies rectal bleeding or abdominal pain. She has been on a regular diet taking occasional Imodium. She has a history of cholecystectomy. Her weight has been stable. She takes care of her sick husband who has Alzheimer's disease.    Past Medical History  Diagnosis Date  . ANEMIA 10/21/2008  . AORTIC INSUFFICIENCY 11/26/2009  . BACK PAIN 06/27/2007  . COLON CANCER, PERSONAL HX 02/25/2010  . Diverticulosis of colon (without mention of hemorrhage) 08/14/2006  . GALLSTONES 10/09/2008  . GLAUCOMA NOS 08/14/2006  . HYPERTENSION 08/14/2006  . HYPOKALEMIA, HX OF 11/26/2009  . MELENA, HX OF 10/12/2008  . NEOPLASM, MALIGNANT, COLON, CECUM 10/30/2008  . OCCLUSION, CENTRAL RETINAL ARTERY 08/14/2006  . PERSONAL HX COLONIC POLYPS 10/21/2008  . SYNCOPE, HX OF 08/14/2006  . URI 09/07/2008  . H/O: stroke     behind right eye   Past Surgical History  Procedure Date  . Appendectomy   . Cataract extraction     bilateral  . Dilation and curettage of uterus   . Tonsillectomy   . Colon surgery     11/20/2008,gallbladder removed  . Ankle fracture surgery 2011    right    reports that she has never smoked. She has never used smokeless tobacco. She reports that she does not drink alcohol or use illicit drugs. family history includes Breast cancer in her sister; Heart attack in her father; and Lung cancer in her brother.  There is no history of Stroke and Heart disease. Allergies  Allergen Reactions  . Amoxicillin     REACTION: unspecified    . Ciprofloxacin   . Methocarbamol     REACTION: generalized rash and itching  . Sulfa Antibiotics Itching        Review of Systems: Denies nausea vomiting dysphagia chest pain  The remainder of the 10 point ROS is negative except as outlined in H&P   Physical Exam: General appearance  Well developed, in no distress. Hard of hearing Eyes- non icteric. HEENT nontraumatic, normocephalic. Mouth no lesions, tongue papillated, no cheilosis. Neck supple without adenopathy, thyroid not enlarged, no carotid bruits, no JVD. Lungs Clear to auscultation bilaterally. Cor normal S1, normal S2, regular rhythm, no murmur,  quiet precordium. Abdomen: Soft nontender with normoactive bowel sounds. No distention, no tympany. No tenderness. Liver edge at costal margin. Well-healed surgical scar. Rectal: Soft Hemoccult negative stool. Normal rectal sphincter tone. Extremities no pedal edema. Skin no lesions. Neurological alert and oriented x 3. Psychological normal mood and affect.  Assessment and Plan:  Problem: Postoperative diarrhea attributed to a right hemicolectomy, cholecystectomy and suspected bacterial overgrowth as well as bile salt overflow. I have discussed limiting her fiber intake and avoiding salads, corn and other high-fiber foods. We will give her Colestid 2 g daily for a bile binding effect and samples of a probiotic and a brief course of Flagyl 250 mg twice a day for 5 days. She will be due for a repeat colonoscopy in January 2014. She will also start taking Imodium on a regular basis, one tablet at bedtime.  06/27/2011 Lina Sar

## 2011-06-29 ENCOUNTER — Other Ambulatory Visit (HOSPITAL_BASED_OUTPATIENT_CLINIC_OR_DEPARTMENT_OTHER): Payer: Medicare Other | Admitting: Lab

## 2011-06-29 ENCOUNTER — Telehealth: Payer: Self-pay | Admitting: Oncology

## 2011-06-29 ENCOUNTER — Ambulatory Visit (HOSPITAL_BASED_OUTPATIENT_CLINIC_OR_DEPARTMENT_OTHER): Payer: Medicare Other | Admitting: Oncology

## 2011-06-29 VITALS — BP 186/87 | HR 72 | Temp 96.7°F | Ht 65.0 in | Wt 159.6 lb

## 2011-06-29 DIAGNOSIS — R599 Enlarged lymph nodes, unspecified: Secondary | ICD-10-CM

## 2011-06-29 DIAGNOSIS — C180 Malignant neoplasm of cecum: Secondary | ICD-10-CM

## 2011-06-29 DIAGNOSIS — C19 Malignant neoplasm of rectosigmoid junction: Secondary | ICD-10-CM

## 2011-06-29 LAB — COMPREHENSIVE METABOLIC PANEL
ALT: 8 U/L (ref 0–35)
Alkaline Phosphatase: 64 U/L (ref 39–117)
CO2: 28 mEq/L (ref 19–32)
Creatinine, Ser: 0.82 mg/dL (ref 0.50–1.10)
Total Bilirubin: 0.4 mg/dL (ref 0.3–1.2)

## 2011-06-29 LAB — CBC WITH DIFFERENTIAL/PLATELET
EOS%: 2.3 % (ref 0.0–7.0)
Eosinophils Absolute: 0.2 10*3/uL (ref 0.0–0.5)
LYMPH%: 20.3 % (ref 14.0–49.7)
MCH: 29.5 pg (ref 25.1–34.0)
MCHC: 33.5 g/dL (ref 31.5–36.0)
MCV: 88.3 fL (ref 79.5–101.0)
MONO%: 9.3 % (ref 0.0–14.0)
NEUT#: 5.6 10*3/uL (ref 1.5–6.5)
Platelets: 227 10*3/uL (ref 145–400)
RBC: 4.7 10*6/uL (ref 3.70–5.45)
nRBC: 0 % (ref 0–0)

## 2011-06-29 LAB — CEA: CEA: 3 ng/mL (ref 0.0–5.0)

## 2011-06-29 NOTE — Telephone Encounter (Signed)
appts made and printed for pt aom °

## 2011-06-29 NOTE — Progress Notes (Signed)
Hematology and Oncology Follow Up Visit  Karen [name] [id number] Oct 17, 1922 78 y.o. 06/29/2011 10:19 AM  CC: Velora Heckler, MD  Hedwig Morton. Juanda Chance, MD  Gordy Savers, MD    Principle Diagnosis: An 78 year old female with the following issues:  1. Stage II colorectal cancer diagnosed in 2010.  She had a T3 N0 disease, status post surgical resection.  2. Questionable inflammatory versus reactive lymphadenopathy that is PET negative.    Prior Therapy: She underwent right hemicolectomy on a T3 N0 disease.  Eleven lymph nodes sampled, 0 involved.  That was done on November 20, 2008.  Current therapy: Observation and suervillance  Interim History:  [name] presents today for a followup visit.  She is a pleasant 78 year old woman with stage II colorectal cancer.  She had a T3 N0 disease with 11 lymph nodes sampled over twoyears ago now.  She had refused adjuvant chemotherapy and she is again as mentioned 2 years out without any evidence of recurrent disease.  She has also had an incidental finding of lymphadenopathy that probably appeared to be reactive in nature.  In any case, [name] had refused workup for that.  Since the last time I saw her, she had not reported really any major problems.  Did not report any hospitalization.  Did not report really any illnesses.  Her bowels have been loose and has seen Dr, Juanda Chance for that and has improved in the last few days. She is performing most activities of daily living without any hindrance or decline and continue caring for her elderly husband.   Medications: I have reviewed the patient's current medications. Current outpatient prescriptions:aspirin 81 MG tablet, Take 1 tablet (81 mg total) by mouth daily., Disp: 90 tablet, Rfl: 6;  atenolol (TENORMIN) 50 MG tablet, Take 1 tablet (50 mg total) by mouth daily., Disp: 90 tablet, Rfl: 6;  calcium carbonate (OS-CAL) 600 MG TABS, Take 600 mg by mouth daily.  , Disp: , Rfl:  colestipol (COLESTID) 1 G  tablet, Take 2 tablets by mouth once daily. Take at least 1 hour apart from all other medications., Disp: 60 tablet, Rfl: 2;  diphenhydrAMINE (SOMINEX) 25 MG tablet, Take 25 mg by mouth at bedtime as needed., Disp: , Rfl: ;  dorzolamide-timolol (COSOPT) 22.3-6.8 MG/ML ophthalmic solution, , Disp: , Rfl: ;  loperamide (IMODIUM A-D) 2 MG tablet, Take 1 tablet (2 mg total) by mouth at bedtime., Disp: 30 tablet, Rfl: 0 LUMIGAN 0.01 % SOLN, , Disp: , Rfl: ;  metroNIDAZOLE (FLAGYL) 250 MG tablet, Take 1 tablet (250 mg total) by mouth 2 (two) times daily., Disp: 10 tablet, Rfl: 0;  Omega-3 Fatty Acids (FISH OIL PO), Take 1 tablet by mouth daily., Disp: , Rfl: ;  Polyethyl Glycol-Propyl Glycol (SYSTANE ULTRA) 0.4-0.3 % SOLN, Apply 1 drop to eye as needed., Disp: , Rfl:  DISCONTD: potassium chloride SA (KLOR-CON M20) 20 MEQ tablet, Take 1 tablet (20 mEq total) by mouth daily., Disp: 90 tablet, Rfl: 3  Allergies:  Allergies  Allergen Reactions  . Amoxicillin     REACTION: unspecified  . Ciprofloxacin   . Methocarbamol     REACTION: generalized rash and itching  . Sulfa Antibiotics Itching    Past Medical History, Surgical history, Social history, and Family History were reviewed and updated.  Review of Systems: Constitutional:  Negative for fever, chills, night sweats, anorexia, weight loss, pain. Cardiovascular: no chest pain or dyspnea on exertion Respiratory: no cough, shortness of breath, or wheezing Neurological:  negative Dermatological: negative ENT: negative Skin: Negative. Gastrointestinal: no abdominal pain, change in bowel habits, or black or bloody stools Genito-Urinary: negative Hematological and Lymphatic: negative Breast: negative Musculoskeletal: negative Remaining ROS negative. Physical Exam: Blood pressure 186/87, pulse 72, temperature 96.7 F (35.9 C), temperature source Oral, height 5\' 5"  (1.651 m), weight 159 lb 9.6 oz (72.394 kg). ECOG: 1 General appearance: alert Head:  Normocephalic, without obvious abnormality, atraumatic Neck: no adenopathy, no carotid bruit, no JVD, supple, symmetrical, trachea midline and thyroid not enlarged, symmetric, no tenderness/mass/nodules Lymph nodes: Cervical, supraclavicular, and axillary nodes normal. Heart:regular rate and rhythm, S1, S2 normal, no murmur, click, rub or gallop Lung:chest clear, no wheezing, rales, normal symmetric air entry Abdomin: soft, non-tender, without masses or organomegaly EXT:no erythema, induration, or nodules   Lab Results: Lab Results  Component Value Date   WBC 8.3 06/29/2011   HGB 13.9 06/29/2011   HCT 41.5 06/29/2011   MCV 88.3 06/29/2011   PLT 227 06/29/2011     Chemistry      Component Value Date/Time   NA 137 02/23/2011 1031   NA 145 12/13/2010 1053   K 5.2* 02/23/2011 1031   K 4.0 12/13/2010 1053   CL 100 02/23/2011 1031   CL 102 12/13/2010 1053   CO2 27 02/23/2011 1031   CO2 27 12/13/2010 1053   BUN 19 02/23/2011 1031   BUN 15 12/13/2010 1053   CREATININE 1.2 02/23/2011 1031   CREATININE 0.8 12/13/2010 1053      Component Value Date/Time   CALCIUM 9.2 02/23/2011 1031   CALCIUM 9.1 12/13/2010 1053   ALKPHOS 66 12/13/2010 1053   ALKPHOS 64 06/14/2010 0948   ALKPHOS 64 06/14/2010 0948   AST 22 12/13/2010 1053   AST 19 06/14/2010 0948   AST 19 06/14/2010 0948   ALT 11 06/14/2010 0948   ALT 11 06/14/2010 0948   BILITOT 0.50 12/13/2010 1053   BILITOT 0.4 06/14/2010 0948   BILITOT 0.4 06/14/2010 0948       Impression and Plan:  This is a pleasant 78 year old woman with the following issues:  1. Stage II colorectal cancer.  She has really no evidence to suggest recurrent or relapsed disease at this time.  The plan is to continue to be on active surveillance.  Have her follow up in 12 months' time for physical examination and laboratory and CT scan. 2. Reactive lymphadenopathy.  Again I feel that this is probably an incidental finding and does not reflect malignancy.  No further workup is  necessary.  3.     Colon cancer screening.  Again I have recommended her to follow up with a colonoscopy in 03/2012   [facility], MD 4/4/201310:19 AM

## 2011-10-06 ENCOUNTER — Encounter: Payer: Self-pay | Admitting: Internal Medicine

## 2011-11-13 DIAGNOSIS — H409 Unspecified glaucoma: Secondary | ICD-10-CM | POA: Diagnosis not present

## 2011-11-13 DIAGNOSIS — H348392 Tributary (branch) retinal vein occlusion, unspecified eye, stable: Secondary | ICD-10-CM | POA: Diagnosis not present

## 2011-11-13 DIAGNOSIS — H4011X0 Primary open-angle glaucoma, stage unspecified: Secondary | ICD-10-CM | POA: Diagnosis not present

## 2011-11-13 DIAGNOSIS — H3560 Retinal hemorrhage, unspecified eye: Secondary | ICD-10-CM | POA: Diagnosis not present

## 2011-11-13 DIAGNOSIS — Z961 Presence of intraocular lens: Secondary | ICD-10-CM | POA: Diagnosis not present

## 2011-11-15 ENCOUNTER — Encounter (INDEPENDENT_AMBULATORY_CARE_PROVIDER_SITE_OTHER): Payer: Medicare Other | Admitting: Ophthalmology

## 2011-11-15 DIAGNOSIS — H3530 Unspecified macular degeneration: Secondary | ICD-10-CM | POA: Diagnosis not present

## 2011-11-15 DIAGNOSIS — I10 Essential (primary) hypertension: Secondary | ICD-10-CM

## 2011-11-15 DIAGNOSIS — H348392 Tributary (branch) retinal vein occlusion, unspecified eye, stable: Secondary | ICD-10-CM

## 2011-11-15 DIAGNOSIS — H43819 Vitreous degeneration, unspecified eye: Secondary | ICD-10-CM

## 2011-11-15 DIAGNOSIS — H35039 Hypertensive retinopathy, unspecified eye: Secondary | ICD-10-CM | POA: Diagnosis not present

## 2011-12-21 ENCOUNTER — Encounter: Payer: Self-pay | Admitting: Internal Medicine

## 2011-12-21 ENCOUNTER — Ambulatory Visit (INDEPENDENT_AMBULATORY_CARE_PROVIDER_SITE_OTHER): Payer: Medicare Other | Admitting: Internal Medicine

## 2011-12-21 VITALS — BP 122/74 | Temp 98.2°F | Wt 154.0 lb

## 2011-12-21 DIAGNOSIS — Z23 Encounter for immunization: Secondary | ICD-10-CM

## 2011-12-21 DIAGNOSIS — C180 Malignant neoplasm of cecum: Secondary | ICD-10-CM

## 2011-12-21 DIAGNOSIS — I10 Essential (primary) hypertension: Secondary | ICD-10-CM

## 2011-12-21 NOTE — Patient Instructions (Signed)
Limit your sodium (Salt) intake    It is important that you exercise regularly, at least 20 minutes 3 to 4 times per week.  If you develop chest pain or shortness of breath seek  medical attention.  Return in 6 months for follow-up  Take a calcium supplement, plus 800-1200 units of vitamin D 

## 2011-12-21 NOTE — Progress Notes (Signed)
Subjective:    Patient ID: Karen [name], female    DOB: 1923-01-06, 77 y.o.   MRN: [id number]  HPI  78 year old patient who is seen today for followup. She has a history of treated hypertension and also history of mild aortic insufficiency. She is doing reasonably well. Some situational stress 2 to the poor health of her husband. She is the primary caregiver. She has a history colon cancer status post resection in 2010  Past Medical History  Diagnosis Date  . ANEMIA 10/21/2008  . AORTIC INSUFFICIENCY 11/26/2009  . BACK PAIN 06/27/2007  . COLON CANCER, PERSONAL HX 02/25/2010  . Diverticulosis of colon (without mention of hemorrhage) 08/14/2006  . GALLSTONES 10/09/2008  . GLAUCOMA NOS 08/14/2006  . HYPERTENSION 08/14/2006  . HYPOKALEMIA, HX OF 11/26/2009  . MELENA, HX OF 10/12/2008  . NEOPLASM, MALIGNANT, COLON, CECUM 10/30/2008  . OCCLUSION, CENTRAL RETINAL ARTERY 08/14/2006  . PERSONAL HX COLONIC POLYPS 10/21/2008  . SYNCOPE, HX OF 08/14/2006  . URI 09/07/2008  . H/O: stroke     behind right eye    History   Social History  . Marital Status: Married    Spouse Name: N/A    Number of Children: 3  . Years of Education: N/A   Occupational History  . retired    Social History Main Topics  . Smoking status: Never Smoker   . Smokeless tobacco: Never Used  . Alcohol Use: No  . Drug Use: No  . Sexually Active: Not on file   Other Topics Concern  . Not on file   Social History Narrative  . No narrative on file    Past Surgical History  Procedure Date  . Appendectomy   . Cataract extraction     bilateral  . Dilation and curettage of uterus   . Tonsillectomy   . Colon surgery     11/20/2008,gallbladder removed  . Ankle fracture surgery 2011    right    Family History  Problem Relation Age of Onset  . Stroke Neg Hx   . Heart disease Neg Hx   . Breast cancer Sister   . Heart attack Father   . Lung cancer Brother     Allergies  Allergen Reactions  . Amoxicillin    REACTION: unspecified  . Ciprofloxacin   . Methocarbamol     REACTION: generalized rash and itching  . Sulfa Antibiotics Itching    Current Outpatient Prescriptions on File Prior to Visit  Medication Sig Dispense Refill  . aspirin 81 MG tablet Take 1 tablet (81 mg total) by mouth daily.  90 tablet  6  . atenolol (TENORMIN) 50 MG tablet Take 1 tablet (50 mg total) by mouth daily.  90 tablet  6  . calcium carbonate (OS-CAL) 600 MG TABS Take 600 mg by mouth daily.        . colestipol (COLESTID) 1 G tablet Take 2 tablets by mouth once daily. Take at least 1 hour apart from all other medications.  60 tablet  2  . diphenhydrAMINE (SOMINEX) 25 MG tablet Take 25 mg by mouth at bedtime as needed.      . dorzolamide-timolol (COSOPT) 22.3-6.8 MG/ML ophthalmic solution       . LUMIGAN 0.01 % SOLN       . Omega-3 Fatty Acids (FISH OIL PO) Take 1 tablet by mouth daily.      Bertram Gala Glycol-Propyl Glycol (SYSTANE ULTRA) 0.4-0.3 % SOLN Apply 1 drop to eye as needed.      Marland Kitchen  potassium chloride SA (K-DUR,KLOR-CON) 20 MEQ tablet Take 20 mEq by mouth daily.        BP 122/74  Temp 98.2 F (36.8 C) (Oral)  Wt 154 lb (69.854 kg)       Review of Systems  Constitutional: Negative.   HENT: Negative for hearing loss, congestion, sore throat, rhinorrhea, dental problem, sinus pressure and tinnitus.   Eyes: Negative for pain, discharge and visual disturbance.  Respiratory: Negative for cough and shortness of breath.   Cardiovascular: Negative for chest pain, palpitations and leg swelling.  Gastrointestinal: Negative for nausea, vomiting, abdominal pain, diarrhea, constipation, blood in stool and abdominal distention.  Genitourinary: Negative for dysuria, urgency, frequency, hematuria, flank pain, vaginal bleeding, vaginal discharge, difficulty urinating, vaginal pain and pelvic pain.  Musculoskeletal: Negative for joint swelling, arthralgias and gait problem.  Skin: Negative for rash.  Neurological:  Negative for dizziness, syncope, speech difficulty, weakness, numbness and headaches.  Hematological: Negative for adenopathy.  Psychiatric/Behavioral: Negative for behavioral problems, dysphoric mood and agitation. The patient is not nervous/anxious.        Objective:   Physical Exam  Constitutional: She is oriented to person, place, and time. She appears well-developed and well-nourished.  HENT:  Head: Normocephalic.  Right Ear: External ear normal.  Left Ear: External ear normal.  Mouth/Throat: Oropharynx is clear and moist.  Eyes: Conjunctivae normal and EOM are normal. Pupils are equal, round, and reactive to light.  Neck: Normal range of motion. Neck supple. No thyromegaly present.  Cardiovascular: Normal rate, regular rhythm, normal heart sounds and intact distal pulses.   Pulmonary/Chest: Effort normal and breath sounds normal.  Abdominal: Soft. Bowel sounds are normal. She exhibits no mass. There is no tenderness.  Musculoskeletal: Normal range of motion.  Lymphadenopathy:    She has no cervical adenopathy.  Neurological: She is alert and oriented to person, place, and time.  Skin: Skin is warm and dry. No rash noted.  Psychiatric: She has a normal mood and affect. Her behavior is normal.          Assessment & Plan:   Hypertension well controlled. We'll continue present regimen History of colon cancer  CPX in 6 months Follow GI and general surgery Return here when necessary

## 2011-12-27 ENCOUNTER — Other Ambulatory Visit: Payer: Self-pay | Admitting: Internal Medicine

## 2011-12-30 ENCOUNTER — Other Ambulatory Visit: Payer: Self-pay | Admitting: Internal Medicine

## 2012-01-08 DIAGNOSIS — H4011X0 Primary open-angle glaucoma, stage unspecified: Secondary | ICD-10-CM | POA: Diagnosis not present

## 2012-01-08 DIAGNOSIS — H04129 Dry eye syndrome of unspecified lacrimal gland: Secondary | ICD-10-CM | POA: Diagnosis not present

## 2012-01-08 DIAGNOSIS — H409 Unspecified glaucoma: Secondary | ICD-10-CM | POA: Diagnosis not present

## 2012-02-19 DIAGNOSIS — Z1231 Encounter for screening mammogram for malignant neoplasm of breast: Secondary | ICD-10-CM | POA: Diagnosis not present

## 2012-02-19 LAB — HM MAMMOGRAPHY: HM Mammogram: NORMAL

## 2012-02-20 ENCOUNTER — Encounter: Payer: Self-pay | Admitting: Internal Medicine

## 2012-03-18 ENCOUNTER — Ambulatory Visit (INDEPENDENT_AMBULATORY_CARE_PROVIDER_SITE_OTHER): Payer: Medicare Other | Admitting: Ophthalmology

## 2012-03-18 DIAGNOSIS — H43819 Vitreous degeneration, unspecified eye: Secondary | ICD-10-CM

## 2012-03-18 DIAGNOSIS — H3530 Unspecified macular degeneration: Secondary | ICD-10-CM

## 2012-03-18 DIAGNOSIS — I10 Essential (primary) hypertension: Secondary | ICD-10-CM

## 2012-03-18 DIAGNOSIS — H348392 Tributary (branch) retinal vein occlusion, unspecified eye, stable: Secondary | ICD-10-CM

## 2012-03-18 DIAGNOSIS — H35039 Hypertensive retinopathy, unspecified eye: Secondary | ICD-10-CM

## 2012-05-23 DIAGNOSIS — H353190 Nonexudative age-related macular degeneration, unspecified eye, stage unspecified: Secondary | ICD-10-CM | POA: Diagnosis not present

## 2012-05-23 DIAGNOSIS — H43399 Other vitreous opacities, unspecified eye: Secondary | ICD-10-CM | POA: Diagnosis not present

## 2012-05-23 DIAGNOSIS — H4011X0 Primary open-angle glaucoma, stage unspecified: Secondary | ICD-10-CM | POA: Diagnosis not present

## 2012-05-23 DIAGNOSIS — H35039 Hypertensive retinopathy, unspecified eye: Secondary | ICD-10-CM | POA: Diagnosis not present

## 2012-05-23 DIAGNOSIS — Z961 Presence of intraocular lens: Secondary | ICD-10-CM | POA: Diagnosis not present

## 2012-06-06 ENCOUNTER — Encounter: Payer: Self-pay | Admitting: Family Medicine

## 2012-06-06 ENCOUNTER — Ambulatory Visit (INDEPENDENT_AMBULATORY_CARE_PROVIDER_SITE_OTHER): Payer: Medicare Other | Admitting: Family Medicine

## 2012-06-06 VITALS — BP 136/80 | HR 69 | Temp 97.9°F | Wt 154.0 lb

## 2012-06-06 DIAGNOSIS — J209 Acute bronchitis, unspecified: Secondary | ICD-10-CM

## 2012-06-06 MED ORDER — HYDROCODONE-HOMATROPINE 5-1.5 MG/5ML PO SYRP: 5.0000 mL | ORAL_SOLUTION | ORAL | Status: DC | PRN

## 2012-06-06 MED ORDER — DOXYCYCLINE HYCLATE 100 MG PO CAPS: 100.0000 mg | ORAL_CAPSULE | Freq: Two times a day (BID) | ORAL | Status: AC

## 2012-06-06 NOTE — Progress Notes (Signed)
  Subjective:    Patient ID: Karen [name], female    DOB: [birth year], 79 y.o.   MRN: [id number]  HPI Here for 3 days of PND, chest tightness and a dry cough. No SOB or chest pain.    Review of Systems  Constitutional: Negative.   HENT: Positive for congestion and postnasal drip.   Eyes: Negative.   Respiratory: Positive for cough and chest tightness. Negative for shortness of breath and wheezing.   Cardiovascular: Negative.        Objective:   Physical Exam  Constitutional: She appears well-developed and well-nourished.  HENT:  Right Ear: External ear normal.  Left Ear: External ear normal.  Nose: Nose normal.  Mouth/Throat: Oropharynx is clear and moist.  Eyes: Conjunctivae are normal.  Neck: No thyromegaly present.  Cardiovascular: Normal rate, regular rhythm, normal heart sounds and intact distal pulses.   Pulmonary/Chest: Effort normal and breath sounds normal.  Lymphadenopathy:    She has no cervical adenopathy.          Assessment & Plan:  Drink fluids, rest

## 2012-06-12 DIAGNOSIS — H4011X0 Primary open-angle glaucoma, stage unspecified: Secondary | ICD-10-CM | POA: Diagnosis not present

## 2012-06-12 DIAGNOSIS — H409 Unspecified glaucoma: Secondary | ICD-10-CM | POA: Diagnosis not present

## 2012-06-20 ENCOUNTER — Encounter: Payer: Self-pay | Admitting: Internal Medicine

## 2012-06-20 ENCOUNTER — Ambulatory Visit (INDEPENDENT_AMBULATORY_CARE_PROVIDER_SITE_OTHER): Payer: Medicare Other | Admitting: Internal Medicine

## 2012-06-20 VITALS — BP 142/80 | HR 82 | Temp 97.6°F | Resp 20 | Ht 65.0 in | Wt 154.0 lb

## 2012-06-20 DIAGNOSIS — J069 Acute upper respiratory infection, unspecified: Secondary | ICD-10-CM

## 2012-06-20 DIAGNOSIS — D649 Anemia, unspecified: Secondary | ICD-10-CM

## 2012-06-20 DIAGNOSIS — Z0000 Encounter for general adult medical examination without abnormal findings: Secondary | ICD-10-CM

## 2012-06-20 DIAGNOSIS — C180 Malignant neoplasm of cecum: Secondary | ICD-10-CM

## 2012-06-20 DIAGNOSIS — I10 Essential (primary) hypertension: Secondary | ICD-10-CM | POA: Diagnosis not present

## 2012-06-20 DIAGNOSIS — Z862 Personal history of diseases of the blood and blood-forming organs and certain disorders involving the immune mechanism: Secondary | ICD-10-CM

## 2012-06-20 DIAGNOSIS — I359 Nonrheumatic aortic valve disorder, unspecified: Secondary | ICD-10-CM | POA: Diagnosis not present

## 2012-06-20 MED ORDER — TRIMETHOPRIM 100 MG PO TABS: 100.0000 mg | ORAL_TABLET | Freq: Two times a day (BID) | ORAL | Status: DC

## 2012-06-20 MED ORDER — ATENOLOL 50 MG PO TABS: 50.0000 mg | ORAL_TABLET | Freq: Every day | ORAL | Status: DC

## 2012-06-20 NOTE — Progress Notes (Signed)
Patient ID: Karen [name], female   DOB: March 22, 1923, 79 y.o.   MRN: [id number]  Subjective:    Patient ID: Karen [name], female    DOB: 11-06-1922, 79 y.o.   MRN: [id number]  HPI  79 -year-old patient who is in today for a preventive health examination. Medical problems include a history of hypertension she has been no well-controlled on atenolol 50 mg daily. She has been released from general surgery following a partial colectomy for colon cancer. She continues to have some diarrhea. Otherwise done quite well although under considerable stress can for the health of her elderly disabled husband. She is followed by ophthalmology due to glaucoma. She is scheduled to see oncology next week   1. Risk factors, based on past  M,S,F history- cardiovascular risk factors include age and hypertension she does have a history of mild aortic insufficiency EKG today normal  2.  Physical activities: No major exercise limitations  3.  Depression/mood: No history of depression or mood disorder  4.  Hearing: No significant deficits  5.  ADL's: Independent in all aspects of daily living. Cares for a disabled husband  6.  Fall risk: Moderate due  to age  84.  Home safety: No problems identified  8.  Height weight, and visual acuity; height and weight stable no change in visual acuity. Does have a history of glaucoma followed closely by ophthalmology  9.  Counseling: Follow GI calcium and vitamin D supplements encouraged  10. Lab orders based on risk factors: Lab update will be reviewed  11. Referral. Follow GI and oncology  I  12. Care plan: We'll add calcium and vitamin D supplements. More regular exercise encouraged  13. Cognitive assessment: Alert and appropriate normal affect. No cognitive dysfunction  Past Medical History  Diagnosis Date  . ANEMIA 10/21/2008  . AORTIC INSUFFICIENCY 11/26/2009  . BACK PAIN 06/27/2007  . COLON CANCER, PERSONAL HX 02/25/2010  . Diverticulosis of colon (without  mention of hemorrhage) 08/14/2006  . GALLSTONES 10/09/2008  . GLAUCOMA NOS 08/14/2006  . HYPERTENSION 08/14/2006  . HYPOKALEMIA, HX OF 11/26/2009  . MELENA, HX OF 10/12/2008  . NEOPLASM, MALIGNANT, COLON, CECUM 10/30/2008  . OCCLUSION, CENTRAL RETINAL ARTERY 08/14/2006  . PERSONAL HX COLONIC POLYPS 10/21/2008  . SYNCOPE, HX OF 08/14/2006  . URI 09/07/2008  . H/O: stroke     behind right eye    History   Social History  . Marital Status: Married    Spouse Name: N/A    Number of Children: 3  . Years of Education: N/A   Occupational History  . retired    Social History Main Topics  . Smoking status: Never Smoker   . Smokeless tobacco: Never Used  . Alcohol Use: No  . Drug Use: No  . Sexually Active: Not on file   Other Topics Concern  . Not on file   Social History Narrative  . No narrative on file    Past Surgical History  Procedure Laterality Date  . Appendectomy    . Cataract extraction      bilateral  . Dilation and curettage of uterus    . Tonsillectomy    . Colon surgery      11/20/2008,gallbladder removed  . Ankle fracture surgery  2011    right    Family History  Problem Relation Age of Onset  . Stroke Neg Hx   . Heart disease Neg Hx   . Breast cancer Sister   . Heart attack  Father   . Lung cancer Brother     Allergies  Allergen Reactions  . Amoxicillin     REACTION: unspecified  . Ciprofloxacin   . Methocarbamol     REACTION: generalized rash and itching  . Sulfa Antibiotics Itching    Current Outpatient Prescriptions on File Prior to Visit  Medication Sig Dispense Refill  . aspirin 81 MG tablet Take 1 tablet (81 mg total) by mouth daily.  90 tablet  6  . atenolol (TENORMIN) 50 MG tablet Take 1 tablet (50 mg total) by mouth daily.  90 tablet  6  . calcium carbonate (OS-CAL) 600 MG TABS Take 600 mg by mouth daily.        . colestipol (COLESTID) 1 G tablet Take 2 tablets by mouth once daily. Take at least 1 hour apart from all other medications.  60  tablet  2  . diphenhydrAMINE (SOMINEX) 25 MG tablet Take 25 mg by mouth at bedtime as needed.      . dorzolamide-timolol (COSOPT) 22.3-6.8 MG/ML ophthalmic solution       . HYDROcodone-homatropine (HYDROMET) 5-1.5 MG/5ML syrup Take 5 mLs by mouth every 4 (four) hours as needed for cough.  240 mL  0  . KLOR-CON M20 20 MEQ tablet TAKE 1 TABLET (20 MEQ TOTAL) BY MOUTH DAILY.  90 tablet  3  . LUMIGAN 0.01 % SOLN       . Omega-3 Fatty Acids (FISH OIL PO) Take 1 tablet by mouth daily.      Bertram Gala Glycol-Propyl Glycol (SYSTANE ULTRA) 0.4-0.3 % SOLN Apply 1 drop to eye as needed.       No current facility-administered medications on file prior to visit.    BP 142/80  Pulse 82  Temp(Src) 97.6 F (36.4 C) (Oral)  Resp 20  Ht 5\' 5"  (1.651 m)  Wt 154 lb (69.854 kg)  BMI 25.63 kg/m2  SpO2 97%       Allergies:  1) ! Cipro (Ciprofloxacin Hcl)  2) Amoxicillin (Amoxicillin)   Past History:  Past Medical History:  Diverticulosis, colon  Hypertension  Glaucoma  history of right central retinal artery occlusion  history of syncope 2001  COLON POLYPS 2001  cecal cancer 8/10   Past Surgical History:  Appendectomy  Cataract extraction  D&C 1997  Colon polypectomy  Tonsillectomy  colonoscopy September 2006  normal bone density study 2003  colonoscopy August 2010 Status post partial colectomy August 2010    Family History:  Reviewed history from 10/31/2007 and no changes required.  Family History Lung cancer  Family History of Stroke F 1st degree relative <60  Family History of Cardiovascular disorder  father died age 30 MI  mother died age 61, CVA  One brother died lung cancer  4 sisters; one died age 11, cerebral hemorrhage; one died age 62, senile dementia  Social History:   Reviewed history from 06/27/2007 and no changes required.  Retired  Married   Review of Systems  Constitutional: Negative.   HENT: Negative for hearing loss, congestion, sore throat,  rhinorrhea, dental problem, sinus pressure and tinnitus.   Eyes: Negative for pain, discharge and visual disturbance.  Respiratory: Negative for cough and shortness of breath.   Cardiovascular: Negative for chest pain, palpitations and leg swelling.  Gastrointestinal: Positive for diarrhea. Negative for nausea, vomiting, abdominal pain, constipation, blood in stool and abdominal distention.  Genitourinary: Negative for dysuria, urgency, frequency, hematuria, flank pain, vaginal bleeding, vaginal discharge, difficulty urinating, vaginal pain and pelvic pain.  Musculoskeletal: Negative for joint swelling, arthralgias and gait problem.  Skin: Negative for rash.  Neurological: Negative for dizziness, syncope, speech difficulty, weakness, numbness and headaches.  Hematological: Negative for adenopathy.  Psychiatric/Behavioral: Negative for behavioral problems, dysphoric mood and agitation. The patient is not nervous/anxious.        Objective:   Physical Exam  Constitutional: She is oriented to person, place, and time. She appears well-developed and well-nourished.  HENT:  Head: Normocephalic and atraumatic.  Right Ear: External ear normal.  Left Ear: External ear normal.  Mouth/Throat: Oropharynx is clear and moist.  Eyes: Conjunctivae and EOM are normal.  Neck: Normal range of motion. Neck supple. No JVD present. No thyromegaly present.  Cardiovascular: Normal rate, regular rhythm and intact distal pulses.   Murmur heard. Grade 1/6 systolic murmur heard at the base. No murmur of aortic insufficiency noted  Diminished right posterior tibial pulse  Pulmonary/Chest: Effort normal and breath sounds normal. She has no wheezes. She has no rales.  Abdominal: Soft. Bowel sounds are normal. She exhibits no distension and no mass. There is no tenderness. There is no rebound and no guarding.  Genitourinary: Vagina normal. Guaiac negative stool.  Musculoskeletal: Normal range of motion. She exhibits  no edema and no tenderness.  Neurological: She is alert and oriented to person, place, and time. She has normal reflexes. No cranial nerve deficit. She exhibits normal muscle tone. Coordination normal.  Skin: Skin is warm and dry. No rash noted.  Psychiatric: She has a normal mood and affect. Her behavior is normal.          Assessment & Plan:   Preventive health examination Hypertension well controlled Status post partial colectomy for cecal cancer Glaucoma  Medical regimen unchanged. Recheck in 6 months Encourage more regular exercise calcium and vitamin D supplements encouraged

## 2012-06-20 NOTE — Patient Instructions (Signed)
Limit your sodium (Salt) intake  Followup oncology appointment next week    It is important that you exercise regularly, at least 20 minutes 3 to 4 times per week.  If you develop chest pain or shortness of breath seek  medical attention.  Take a calcium supplement, plus [phone] units of vitamin D

## 2012-06-21 MED ORDER — NITROFURANTOIN MACROCRYSTAL 25 MG PO CAPS: 50.0000 mg | ORAL_CAPSULE | Freq: Four times a day (QID) | ORAL | Status: DC

## 2012-06-24 ENCOUNTER — Encounter (HOSPITAL_COMMUNITY): Payer: Self-pay

## 2012-06-24 ENCOUNTER — Ambulatory Visit (HOSPITAL_COMMUNITY)
Admission: RE | Admit: 2012-06-24 | Discharge: 2012-06-24 | Disposition: A | Discharge status: Home / Self Care | Payer: Medicare Other | Source: Ambulatory Visit | Attending: Oncology | Admitting: Oncology

## 2012-06-24 ENCOUNTER — Other Ambulatory Visit (HOSPITAL_BASED_OUTPATIENT_CLINIC_OR_DEPARTMENT_OTHER): Payer: Medicare Other | Admitting: Lab

## 2012-06-24 DIAGNOSIS — J8410 Pulmonary fibrosis, unspecified: Secondary | ICD-10-CM | POA: Diagnosis not present

## 2012-06-24 DIAGNOSIS — Z980 Intestinal bypass and anastomosis status: Secondary | ICD-10-CM | POA: Insufficient documentation

## 2012-06-24 DIAGNOSIS — C180 Malignant neoplasm of cecum: Secondary | ICD-10-CM

## 2012-06-24 DIAGNOSIS — Z9089 Acquired absence of other organs: Secondary | ICD-10-CM | POA: Insufficient documentation

## 2012-06-24 DIAGNOSIS — Z9049 Acquired absence of other specified parts of digestive tract: Secondary | ICD-10-CM | POA: Diagnosis not present

## 2012-06-24 DIAGNOSIS — N281 Cyst of kidney, acquired: Secondary | ICD-10-CM | POA: Insufficient documentation

## 2012-06-24 DIAGNOSIS — I700 Atherosclerosis of aorta: Secondary | ICD-10-CM | POA: Diagnosis not present

## 2012-06-24 DIAGNOSIS — I517 Cardiomegaly: Secondary | ICD-10-CM | POA: Diagnosis not present

## 2012-06-24 DIAGNOSIS — M47814 Spondylosis without myelopathy or radiculopathy, thoracic region: Secondary | ICD-10-CM | POA: Diagnosis not present

## 2012-06-24 DIAGNOSIS — C189 Malignant neoplasm of colon, unspecified: Secondary | ICD-10-CM | POA: Insufficient documentation

## 2012-06-24 DIAGNOSIS — K5730 Diverticulosis of large intestine without perforation or abscess without bleeding: Secondary | ICD-10-CM | POA: Diagnosis not present

## 2012-06-24 LAB — COMPREHENSIVE METABOLIC PANEL (CC13)
Alkaline Phosphatase: 70 U/L (ref 40–150)
BUN: 13.8 mg/dL (ref 7.0–26.0)
CO2: 27 mEq/L (ref 22–29)
Creatinine: 0.8 mg/dL (ref 0.6–1.1)
Glucose: 110 mg/dl — ABNORMAL HIGH (ref 70–99)
Sodium: 141 mEq/L (ref 136–145)
Total Bilirubin: 0.35 mg/dL (ref 0.20–1.20)

## 2012-06-24 LAB — CBC WITH DIFFERENTIAL/PLATELET
Basophils Absolute: 0 10*3/uL (ref 0.0–0.1)
Eosinophils Absolute: 0.1 10*3/uL (ref 0.0–0.5)
HGB: 13.3 g/dL (ref 11.6–15.9)
NEUT#: 4.6 10*3/uL (ref 1.5–6.5)
RDW: 14 % (ref 11.2–14.5)
lymph#: 1.9 10*3/uL (ref 0.9–3.3)

## 2012-06-24 MED ORDER — IOHEXOL 300 MG/ML  SOLN
100.0000 mL | Freq: Once | INTRAMUSCULAR | Status: AC | PRN
  Administered 2012-06-24: 100 mL via INTRAVENOUS

## 2012-06-26 DIAGNOSIS — H4011X0 Primary open-angle glaucoma, stage unspecified: Secondary | ICD-10-CM | POA: Diagnosis not present

## 2012-06-26 DIAGNOSIS — H409 Unspecified glaucoma: Secondary | ICD-10-CM | POA: Diagnosis not present

## 2012-06-28 ENCOUNTER — Telehealth: Payer: Self-pay | Admitting: Oncology

## 2012-06-28 ENCOUNTER — Ambulatory Visit (HOSPITAL_BASED_OUTPATIENT_CLINIC_OR_DEPARTMENT_OTHER): Payer: Medicare Other | Admitting: Oncology

## 2012-06-28 VITALS — BP 175/84 | HR 62 | Temp 96.6°F | Resp 20 | Ht 65.0 in | Wt 155.8 lb

## 2012-06-28 DIAGNOSIS — C180 Malignant neoplasm of cecum: Secondary | ICD-10-CM | POA: Diagnosis not present

## 2012-06-28 DIAGNOSIS — R599 Enlarged lymph nodes, unspecified: Secondary | ICD-10-CM | POA: Diagnosis not present

## 2012-06-28 NOTE — Telephone Encounter (Signed)
Dr. Clelia Croft changed mind about ct...not required

## 2012-06-28 NOTE — Progress Notes (Signed)
Hematology and Oncology Follow Up Visit  Karen [name] [id number] 1922/06/19 79 y.o. 06/28/2012 10:06 AM  CC: Karen Heckler, MD  Karen [name]. Karen Chance, MD  Karen Savers, MD    Principle Diagnosis: An 79 year old female with the following issues:  1. Stage II colorectal cancer diagnosed in 2010.  She had a T3 N0 disease, status post surgical resection.  2. Questionable inflammatory versus reactive lymphadenopathy that is PET negative.   Prior Therapy: She underwent right hemicolectomy on a T3 N0 disease.  Eleven lymph nodes sampled, 0 involved.  That was done on November 20, 2008.  Current therapy: Observation and suervillance  Interim History:  Karen [name] presents today for a followup visit.  She is a pleasant 79 year old woman with stage II colorectal cancer.  She had a T3 N0 disease with 11 lymph nodes sampled close to 4 years out..  She had refused adjuvant chemotherapy and continued to be  without any evidence of recurrent disease.  She has also had an incidental finding of lymphadenopathy that probably appeared to be reactive in nature.  Since the last time I saw her, she had not reported really any major problems.  Did not report any hospitalization.  Did not report really any illnesses.  She is performing most activities of daily living without any hindrance or decline and continue caring for her elderly husband whose health continues to be poor.   Medications: I have reviewed the patient's current medications. Current outpatient prescriptions:aspirin 81 MG tablet, Take 1 tablet (81 mg total) by mouth daily., Disp: 90 tablet, Rfl: 6;  atenolol (TENORMIN) 50 MG tablet, Take 1 tablet (50 mg total) by mouth daily., Disp: 90 tablet, Rfl: 6;  calcium carbonate (OS-CAL) 600 MG TABS, Take 600 mg by mouth daily.  , Disp: , Rfl: ;  diphenhydrAMINE (SOMINEX) 25 MG tablet, Take 25 mg by mouth at bedtime as needed., Disp: , Rfl:  dorzolamide-timolol (COSOPT) 22.3-6.8 MG/ML ophthalmic solution, , Disp:  , Rfl: ;  HYDROcodone-homatropine (HYDROMET) 5-1.5 MG/5ML syrup, Take 5 mLs by mouth every 4 (four) hours as needed for cough., Disp: 240 mL, Rfl: 0;  KLOR-CON M20 20 MEQ tablet, TAKE 1 TABLET (20 MEQ TOTAL) BY MOUTH DAILY., Disp: 90 tablet, Rfl: 3;  LUMIGAN 0.01 % SOLN, , Disp: , Rfl:  nitrofurantoin (MACRODANTIN) 25 MG capsule, Take 2 capsules (50 mg total) by mouth 4 (four) times daily., Disp: 30 capsule, Rfl: 0;  Omega-3 Fatty Acids (FISH OIL PO), Take 1 tablet by mouth daily., Disp: , Rfl: ;  Polyethyl Glycol-Propyl Glycol (SYSTANE ULTRA) 0.4-0.3 % SOLN, Apply 1 drop to eye as needed., Disp: , Rfl: ;  trimethoprim (TRIMPEX) 100 MG tablet, Take 1 tablet (100 mg total) by mouth 2 (two) times daily., Disp: 14 tablet, Rfl: 0  Allergies:  Allergies  Allergen Reactions  . Amoxicillin     REACTION: unspecified  . Ciprofloxacin   . Methocarbamol     REACTION: generalized rash and itching  . Sulfa Antibiotics Itching  . Trimpex (Trimethoprim) Rash    Past Medical History, Surgical history, Social history, and Family History were reviewed and updated.  Review of Systems: Constitutional:  Negative for fever, chills, night sweats, anorexia, weight loss, pain. Cardiovascular: no chest pain or dyspnea on exertion Respiratory: no cough, shortness of breath, or wheezing Neurological: negative Dermatological: negative ENT: negative Skin: Negative. Gastrointestinal: no abdominal pain, change in bowel habits, or black or bloody stools Genito-Urinary: negative Hematological and Lymphatic: negative Breast: negative Musculoskeletal: negative  Remaining ROS negative. Physical Exam: Blood pressure 208/77, pulse 62, temperature 96.6 F (35.9 C), temperature source Oral, resp. rate 20, height 5\' 5"  (1.651 m), weight 155 lb 12.8 oz (70.67 kg). ECOG: 1 General appearance: alert Head: Normocephalic, without obvious abnormality, atraumatic Neck: no adenopathy, no carotid bruit, no JVD, supple,  symmetrical, trachea midline and thyroid not enlarged, symmetric, no tenderness/mass/nodules Lymph nodes: Cervical, supraclavicular, and axillary nodes normal. Heart:regular rate and rhythm, S1, S2 normal, no murmur, click, rub or gallop Lung:chest clear, no wheezing, rales, normal symmetric air entry Abdomin: soft, non-tender, without masses or organomegaly EXT:no erythema, induration, or nodules   Lab Results: Lab Results  Component Value Date   WBC 7.3 06/24/2012   HGB 13.3 06/24/2012   HCT 40.1 06/24/2012   MCV 86.3 06/24/2012   PLT 265 06/24/2012     Chemistry      Component Value Date/Time   NA 141 06/24/2012 0932   NA 142 06/29/2011 0920   NA 145 12/13/2010 1053   K 4.0 06/24/2012 0932   K 3.9 06/29/2011 0920   K 4.0 12/13/2010 1053   CL 103 06/24/2012 0932   CL 103 06/29/2011 0920   CL 102 12/13/2010 1053   CO2 27 06/24/2012 0932   CO2 28 06/29/2011 0920   CO2 27 12/13/2010 1053   BUN 13.8 06/24/2012 0932   BUN 16 06/29/2011 0920   BUN 15 12/13/2010 1053   CREATININE 0.8 06/24/2012 0932   CREATININE 0.82 06/29/2011 0920   CREATININE 0.8 12/13/2010 1053      Component Value Date/Time   CALCIUM 9.4 06/24/2012 0932   CALCIUM 9.3 06/29/2011 0920   CALCIUM 9.1 12/13/2010 1053   ALKPHOS 70 06/24/2012 0932   ALKPHOS 64 06/29/2011 0920   ALKPHOS 66 12/13/2010 1053   AST 24 06/24/2012 0932   AST 19 06/29/2011 0920   AST 22 12/13/2010 1053   ALT 10 06/24/2012 0932   ALT 8 06/29/2011 0920   BILITOT 0.35 06/24/2012 0932   BILITOT 0.4 06/29/2011 0920   BILITOT 0.50 12/13/2010 1053     CT CHEST, ABDOMEN AND PELVIS WITH CONTRAST  Technique: Multidetector CT imaging of the chest, abdomen and  pelvis was performed following the standard protocol during bolus  administration of intravenous contrast.  Contrast: OMNIPAQUE IOHEXOL 300 MG/ML SOLN  Comparison: 12/13/2010  CT CHEST  Findings: Lungs/pleura: No pleural effusion. Calcified granuloma  is again noted in the right upper lobe.  Heart/Mediastinum:  Heart size is moderately enlarged. There are  prominent calcifications involving the LAD coronary artery. No  pericardial effusion. Prevascular lymph node measures 6 mm, image  20/series 2. The adjacent prevascular lymph node is stable  measuring 7 mm, image 21/series 2. Abnormal soft tissue abnormal  paratracheal and peribronchial soft tissue thickening is again  noted. This appears similar to previous examination. No new or  progressive adenopathy within the mediastinum identified.  Bones/Musculoskeletal: Review of the visualized osseous structures  is significant for multilevel spondylosis within the thoracic  spine. There is no worrisome lytic or sclerotic bone lesions.  IMPRESSION:  1. Stable CT of the chest.  2. No significant change in the borderline enlarged prevascular  lymph nodes.  3. Soft tissue infiltration within the paratracheal and  peribronchial soft tissues of the mediastinum is again noted. This  appears stable when compared with previous exam.  CT ABDOMEN AND PELVIS  Findings: Within the left hepatic lobe there is a 8 mm low  attenuation structure which likely represents  a small cyst. This  is unchanged from previous study. There are no suspicious liver  lesions. Previous cholecystectomy. Increased caliber of the  common bile duct has a maximum diameter of 1.2 cm. This is similar  to prior exam.  There is no suspicious liver lesions identified. No focal splenic  abnormalities. The adrenal glands are both normal.  Cyst within the upper pole the right kidney is unchanged. Similar  appearance of the left upper pole cyst. The urinary bladder is  unremarkable. The uterus and adnexal structures appear normal for  patient's age.  Normal caliber of the abdominal aorta. There is calcified  atherosclerotic disease of the abdominal aorta but no aneurysm.  Gastrohepatic ligament lymph node measures 7 mm, image 51/series 2.  This is stable from previous exam. The periaortic  lymph node  measures 8 mm, image 66/series 2. Previously this measured the  same. No enlarged iliac or inguinal lymph nodes.  The stomach appears within normal limits. The small bowel loops  have a normal course and caliber without evidence for obstruction.  Postsurgical changes from right hemicolectomy with enterocolonic  anastomosis is stable from previous study. Multiple colonic  diverticula identified without acute inflammation.  There is no free fluid or fluid collection within the abdomen or  pelvis. No peritoneal nodule or mass identified. Review of the  visualized osseous structures is significant for multilevel  degenerative disc disease.  IMPRESSION:  1. No acute findings.  2. No evidence for mass or adenopathy.   Impression and Plan:  This is a pleasant 79 year old woman with the following issues:  1. Stage II colorectal cancer.  She has really no evidence to suggest recurrent or relapsed disease at this time. CT scan and labs from 06/24/2012 reviewed and continue to be NED.The plan is to continue to be on active surveillance.  Have her follow up in 12 months' time for physical examination and laboratory only. We will repeat CT scan as needed.  2. Reactive lymphadenopathy.  Again I feel that this is probably an incidental finding and does not reflect malignancy.  No further workup is necessary.  3.     Colon cancer screening.  Again I have recommended her to follow up with a colonoscopy at some point in 2014.    [facility], MD 4/4/201410:06 AM

## 2012-06-28 NOTE — Telephone Encounter (Signed)
gv and printed appt schedule for pt for april 2015

## 2012-07-17 ENCOUNTER — Ambulatory Visit (INDEPENDENT_AMBULATORY_CARE_PROVIDER_SITE_OTHER): Payer: Medicare Other | Admitting: Ophthalmology

## 2012-07-17 DIAGNOSIS — I10 Essential (primary) hypertension: Secondary | ICD-10-CM | POA: Diagnosis not present

## 2012-07-17 DIAGNOSIS — H3530 Unspecified macular degeneration: Secondary | ICD-10-CM | POA: Diagnosis not present

## 2012-07-17 DIAGNOSIS — H348392 Tributary (branch) retinal vein occlusion, unspecified eye, stable: Secondary | ICD-10-CM | POA: Diagnosis not present

## 2012-07-17 DIAGNOSIS — H43819 Vitreous degeneration, unspecified eye: Secondary | ICD-10-CM

## 2012-07-17 DIAGNOSIS — H35039 Hypertensive retinopathy, unspecified eye: Secondary | ICD-10-CM | POA: Diagnosis not present

## 2012-07-18 ENCOUNTER — Ambulatory Visit (INDEPENDENT_AMBULATORY_CARE_PROVIDER_SITE_OTHER): Payer: Self-pay | Admitting: Ophthalmology

## 2012-08-20 DIAGNOSIS — H409 Unspecified glaucoma: Secondary | ICD-10-CM | POA: Diagnosis not present

## 2012-08-20 DIAGNOSIS — H4011X0 Primary open-angle glaucoma, stage unspecified: Secondary | ICD-10-CM | POA: Diagnosis not present

## 2012-08-20 DIAGNOSIS — H40059 Ocular hypertension, unspecified eye: Secondary | ICD-10-CM | POA: Diagnosis not present

## 2012-09-17 DIAGNOSIS — H01009 Unspecified blepharitis unspecified eye, unspecified eyelid: Secondary | ICD-10-CM | POA: Diagnosis not present

## 2012-09-17 DIAGNOSIS — H04129 Dry eye syndrome of unspecified lacrimal gland: Secondary | ICD-10-CM | POA: Diagnosis not present

## 2012-09-17 DIAGNOSIS — H409 Unspecified glaucoma: Secondary | ICD-10-CM | POA: Diagnosis not present

## 2012-09-17 DIAGNOSIS — H4011X0 Primary open-angle glaucoma, stage unspecified: Secondary | ICD-10-CM | POA: Diagnosis not present

## 2012-09-17 DIAGNOSIS — H40059 Ocular hypertension, unspecified eye: Secondary | ICD-10-CM | POA: Diagnosis not present

## 2012-10-17 DIAGNOSIS — H348192 Central retinal vein occlusion, unspecified eye, stable: Secondary | ICD-10-CM | POA: Diagnosis not present

## 2012-10-17 DIAGNOSIS — H4300 Vitreous prolapse, unspecified eye: Secondary | ICD-10-CM | POA: Diagnosis not present

## 2012-10-17 DIAGNOSIS — H409 Unspecified glaucoma: Secondary | ICD-10-CM | POA: Diagnosis not present

## 2012-10-17 DIAGNOSIS — H4011X0 Primary open-angle glaucoma, stage unspecified: Secondary | ICD-10-CM | POA: Diagnosis not present

## 2012-11-26 ENCOUNTER — Encounter: Payer: Self-pay | Admitting: Internal Medicine

## 2012-11-26 ENCOUNTER — Ambulatory Visit (INDEPENDENT_AMBULATORY_CARE_PROVIDER_SITE_OTHER): Payer: Medicare Other | Admitting: Internal Medicine

## 2012-11-26 VITALS — BP 150/80 | HR 74 | Temp 98.1°F | Resp 20 | Wt 163.0 lb

## 2012-11-26 DIAGNOSIS — I10 Essential (primary) hypertension: Secondary | ICD-10-CM

## 2012-11-26 DIAGNOSIS — J069 Acute upper respiratory infection, unspecified: Secondary | ICD-10-CM

## 2012-11-26 MED ORDER — HYDROCODONE-HOMATROPINE 5-1.5 MG/5ML PO SYRP: 2.5000 mL | ORAL_SOLUTION | ORAL | Status: DC | PRN

## 2012-11-26 NOTE — Progress Notes (Signed)
Subjective:    Patient ID: Karen [name], female    DOB: May 26, 1922, 79 y.o.   MRN: [id number]  HPI 79 year old patient who presents with a 3 to four-day history of cough. She has felt a bit unwell cough has been mildly productive of yellow to green sputum. There's been no shortness of breath wheezing fever or chills. She does care for an elderly husband who has been recently discharged from the hospital.  Past Medical History  Diagnosis Date  . ANEMIA 10/21/2008  . AORTIC INSUFFICIENCY 11/26/2009  . BACK PAIN 06/27/2007  . COLON CANCER, PERSONAL HX 02/25/2010  . Diverticulosis of colon (without mention of hemorrhage) 08/14/2006  . GALLSTONES 10/09/2008  . GLAUCOMA NOS 08/14/2006  . HYPERTENSION 08/14/2006  . HYPOKALEMIA, HX OF 11/26/2009  . MELENA, HX OF 10/12/2008  . NEOPLASM, MALIGNANT, COLON, CECUM 10/30/2008  . OCCLUSION, CENTRAL RETINAL ARTERY 08/14/2006  . PERSONAL HX COLONIC POLYPS 10/21/2008  . SYNCOPE, HX OF 08/14/2006  . URI 09/07/2008  . H/O: stroke     behind right eye    History   Social History  . Marital Status: Married    Spouse Name: N/A    Number of Children: 3  . Years of Education: N/A   Occupational History  . retired    Social History Main Topics  . Smoking status: Never Smoker   . Smokeless tobacco: Never Used  . Alcohol Use: No  . Drug Use: No  . Sexual Activity: Not on file   Other Topics Concern  . Not on file   Social History Narrative  . No narrative on file    Past Surgical History  Procedure Laterality Date  . Appendectomy    . Cataract extraction      bilateral  . Dilation and curettage of uterus    . Tonsillectomy    . Colon surgery      11/20/2008,gallbladder removed  . Ankle fracture surgery  2011    right    Family History  Problem Relation Age of Onset  . Stroke Neg Hx   . Heart disease Neg Hx   . Breast cancer Sister   . Heart attack Father   . Lung cancer Brother     Allergies  Allergen Reactions  . Amoxicillin    REACTION: unspecified  . Ciprofloxacin   . Methocarbamol     REACTION: generalized rash and itching  . Sulfa Antibiotics Itching  . Trimpex [Trimethoprim] Rash    Current Outpatient Prescriptions on File Prior to Visit  Medication Sig Dispense Refill  . aspirin 81 MG tablet Take 1 tablet (81 mg total) by mouth daily.  90 tablet  6  . atenolol (TENORMIN) 50 MG tablet Take 1 tablet (50 mg total) by mouth daily.  90 tablet  6  . calcium carbonate (OS-CAL) 600 MG TABS Take 600 mg by mouth daily.        . diphenhydrAMINE (SOMINEX) 25 MG tablet Take 25 mg by mouth at bedtime as needed.      . dorzolamide-timolol (COSOPT) 22.3-6.8 MG/ML ophthalmic solution Place 1 drop into both eyes 2 (two) times daily.       Marland Kitchen LUMIGAN 0.01 % SOLN Apply 1 drop to eye at bedtime.       . Omega-3 Fatty Acids (FISH OIL PO) Take 1 tablet by mouth daily.      Bertram Gala Glycol-Propyl Glycol (SYSTANE ULTRA) 0.4-0.3 % SOLN Apply 1 drop to eye as needed.  No current facility-administered medications on file prior to visit.    BP 150/80  Pulse 74  Temp(Src) 98.1 F (36.7 C) (Oral)  Resp 20  Wt 163 lb (73.936 kg)  BMI 27.12 kg/m2  SpO2 97%       Review of Systems  Constitutional: Positive for fatigue.  HENT: Negative for hearing loss, congestion, sore throat, rhinorrhea, dental problem, sinus pressure and tinnitus.   Eyes: Negative for pain, discharge and visual disturbance.  Respiratory: Positive for cough. Negative for shortness of breath.   Cardiovascular: Negative for chest pain, palpitations and leg swelling.  Gastrointestinal: Negative for nausea, vomiting, abdominal pain, diarrhea, constipation, blood in stool and abdominal distention.  Genitourinary: Negative for dysuria, urgency, frequency, hematuria, flank pain, vaginal bleeding, vaginal discharge, difficulty urinating, vaginal pain and pelvic pain.  Musculoskeletal: Negative for joint swelling, arthralgias and gait problem.  Skin:  Negative for rash.  Neurological: Negative for dizziness, syncope, speech difficulty, weakness, numbness and headaches.  Hematological: Negative for adenopathy.  Psychiatric/Behavioral: Negative for behavioral problems, dysphoric mood and agitation. The patient is not nervous/anxious.        Objective:   Physical Exam  Constitutional: She is oriented to person, place, and time. She appears well-developed and well-nourished.  Appears unwell but in no acute distress. Afebrile O2 saturation 97%  HENT:  Head: Normocephalic.  Right Ear: External ear normal.  Left Ear: External ear normal.  Mouth/Throat: Oropharynx is clear and moist.  Eyes: Conjunctivae and EOM are normal. Pupils are equal, round, and reactive to light.  Neck: Normal range of motion. Neck supple. No thyromegaly present.  Cardiovascular: Normal rate, regular rhythm, normal heart sounds and intact distal pulses.   Pulmonary/Chest: Effort normal and breath sounds normal.  Abdominal: Soft. Bowel sounds are normal. She exhibits no mass. There is no tenderness.  Musculoskeletal: Normal range of motion.  Lymphadenopathy:    She has no cervical adenopathy.  Neurological: She is alert and oriented to person, place, and time.  Skin: Skin is warm and dry. No rash noted.  Psychiatric: She has a normal mood and affect. Her behavior is normal.          Assessment & Plan:   Viral URI with cough. We'll treat with Mucinex DM. If needed Hydromet will be prescribed which she has used in the past

## 2012-11-26 NOTE — Patient Instructions (Addendum)
Acute bronchitis symptoms for less than 10 days are generally not helped by antibiotics.  Take over-the-counter expectorants and cough medications such as  Mucinex DM.  Call if there is no improvement in 5 to 7 days or if he developed worsening cough, fever, or new symptoms, such as shortness of breath or chest pain.   take cough  medicine as directed

## 2013-01-10 ENCOUNTER — Ambulatory Visit: Payer: Medicare Other | Admitting: Internal Medicine

## 2013-01-13 ENCOUNTER — Ambulatory Visit (INDEPENDENT_AMBULATORY_CARE_PROVIDER_SITE_OTHER): Payer: Medicare Other | Admitting: Internal Medicine

## 2013-01-13 ENCOUNTER — Encounter: Payer: Self-pay | Admitting: Internal Medicine

## 2013-01-13 VITALS — BP 126/80 | HR 71 | Temp 97.3°F | Resp 20 | Wt 167.0 lb

## 2013-01-13 DIAGNOSIS — D649 Anemia, unspecified: Secondary | ICD-10-CM | POA: Diagnosis not present

## 2013-01-13 DIAGNOSIS — I10 Essential (primary) hypertension: Secondary | ICD-10-CM

## 2013-01-13 DIAGNOSIS — Z23 Encounter for immunization: Secondary | ICD-10-CM | POA: Diagnosis not present

## 2013-01-13 DIAGNOSIS — M549 Dorsalgia, unspecified: Secondary | ICD-10-CM | POA: Diagnosis not present

## 2013-01-13 NOTE — Patient Instructions (Signed)
Limit your sodium (Salt) intake  Return in 6 months for follow-up  

## 2013-01-13 NOTE — Progress Notes (Signed)
Subjective:    Patient ID: Karen [name], female    DOB: Aug 28, 1922, 79 y.o.   MRN: [id number]  HPI  79 year old patient who is seen today for followup. She is doing quite well. She has a history of hypertension. She is under stress due to to the health of her husband who is 90 and requires considerable assistance. In general doing reasonably well.  Past Medical History  Diagnosis Date  . ANEMIA 10/21/2008  . AORTIC INSUFFICIENCY 11/26/2009  . BACK PAIN 06/27/2007  . COLON CANCER, PERSONAL HX 02/25/2010  . Diverticulosis of colon (without mention of hemorrhage) 08/14/2006  . GALLSTONES 10/09/2008  . GLAUCOMA NOS 08/14/2006  . HYPERTENSION 08/14/2006  . HYPOKALEMIA, HX OF 11/26/2009  . MELENA, HX OF 10/12/2008  . NEOPLASM, MALIGNANT, COLON, CECUM 10/30/2008  . OCCLUSION, CENTRAL RETINAL ARTERY 08/14/2006  . PERSONAL HX COLONIC POLYPS 10/21/2008  . SYNCOPE, HX OF 08/14/2006  . URI 09/07/2008  . H/O: stroke     behind right eye    History   Social History  . Marital Status: Married    Spouse Name: N/A    Number of Children: 3  . Years of Education: N/A   Occupational History  . retired    Social History Main Topics  . Smoking status: Never Smoker   . Smokeless tobacco: Never Used  . Alcohol Use: No  . Drug Use: No  . Sexual Activity: Not on file   Other Topics Concern  . Not on file   Social History Narrative  . No narrative on file    Past Surgical History  Procedure Laterality Date  . Appendectomy    . Cataract extraction      bilateral  . Dilation and curettage of uterus    . Tonsillectomy    . Colon surgery      11/20/2008,gallbladder removed  . Ankle fracture surgery  2011    right    Family History  Problem Relation Age of Onset  . Stroke Neg Hx   . Heart disease Neg Hx   . Breast cancer Sister   . Heart attack Father   . Lung cancer Brother     Allergies  Allergen Reactions  . Amoxicillin     REACTION: unspecified  . Ciprofloxacin   . Methocarbamol    REACTION: generalized rash and itching  . Sulfa Antibiotics Itching  . Trimpex [Trimethoprim] Rash    Current Outpatient Prescriptions on File Prior to Visit  Medication Sig Dispense Refill  . aspirin 81 MG tablet Take 1 tablet (81 mg total) by mouth daily.  90 tablet  6  . atenolol (TENORMIN) 50 MG tablet Take 1 tablet (50 mg total) by mouth daily.  90 tablet  6  . brimonidine (ALPHAGAN P) 0.1 % SOLN Apply 1 drop to eye every 8 (eight) hours.      . calcium carbonate (OS-CAL) 600 MG TABS Take 600 mg by mouth daily.        . diphenhydrAMINE (SOMINEX) 25 MG tablet Take 25 mg by mouth at bedtime as needed.      . dorzolamide-timolol (COSOPT) 22.3-6.8 MG/ML ophthalmic solution Place 1 drop into both eyes 2 (two) times daily.       Marland Kitchen LUMIGAN 0.01 % SOLN Apply 1 drop to eye at bedtime.       . Omega-3 Fatty Acids (FISH OIL PO) Take 1 tablet by mouth daily.      Bertram Gala Glycol-Propyl Glycol (SYSTANE ULTRA) 0.4-0.3 % SOLN  Apply 1 drop to eye as needed.       No current facility-administered medications on file prior to visit.    BP 126/80  Pulse 71  Temp(Src) 97.3 F (36.3 C) (Oral)  Resp 20  Wt 167 lb (75.751 kg)  BMI 27.79 kg/m2  SpO2 97%       Review of Systems  Constitutional: Negative.   HENT: Negative for congestion, dental problem, hearing loss, rhinorrhea, sinus pressure, sore throat and tinnitus.   Eyes: Negative for pain, discharge and visual disturbance.  Respiratory: Negative for cough and shortness of breath.   Cardiovascular: Positive for leg swelling. Negative for chest pain and palpitations.  Gastrointestinal: Negative for nausea, vomiting, abdominal pain, diarrhea, constipation, blood in stool and abdominal distention.  Genitourinary: Negative for dysuria, urgency, frequency, hematuria, flank pain, vaginal bleeding, vaginal discharge, difficulty urinating, vaginal pain and pelvic pain.  Musculoskeletal: Negative for arthralgias, gait problem and joint swelling.   Skin: Negative for rash.  Neurological: Negative for dizziness, syncope, speech difficulty, weakness, numbness and headaches.  Hematological: Negative for adenopathy.  Psychiatric/Behavioral: Negative for behavioral problems, dysphoric mood and agitation. The patient is not nervous/anxious.        Objective:   Physical Exam  Constitutional: She is oriented to person, place, and time. She appears well-developed and well-nourished.  HENT:  Head: Normocephalic.  Right Ear: External ear normal.  Left Ear: External ear normal.  Mouth/Throat: Oropharynx is clear and moist.  Eyes: Conjunctivae and EOM are normal. Pupils are equal, round, and reactive to light.  Neck: Normal range of motion. Neck supple. No thyromegaly present.  Cardiovascular: Normal rate, regular rhythm, normal heart sounds and intact distal pulses.   Pulmonary/Chest: Effort normal and breath sounds normal.  Abdominal: Soft. Bowel sounds are normal. She exhibits no mass. There is no tenderness.  Musculoskeletal: Normal range of motion. She exhibits edema.  Lymphadenopathy:    She has no cervical adenopathy.  Neurological: She is alert and oriented to person, place, and time.  Skin: Skin is warm and dry. No rash noted.  Psychiatric: She has a normal mood and affect. Her behavior is normal.          Assessment & Plan:    Hypertension stable Back pain stable  Vaccine update Recheck 6 months

## 2013-01-16 ENCOUNTER — Ambulatory Visit (INDEPENDENT_AMBULATORY_CARE_PROVIDER_SITE_OTHER): Payer: Medicare Other | Admitting: Ophthalmology

## 2013-01-16 DIAGNOSIS — H43819 Vitreous degeneration, unspecified eye: Secondary | ICD-10-CM

## 2013-01-16 DIAGNOSIS — H348392 Tributary (branch) retinal vein occlusion, unspecified eye, stable: Secondary | ICD-10-CM | POA: Diagnosis not present

## 2013-01-16 DIAGNOSIS — H35039 Hypertensive retinopathy, unspecified eye: Secondary | ICD-10-CM | POA: Diagnosis not present

## 2013-01-16 DIAGNOSIS — H3530 Unspecified macular degeneration: Secondary | ICD-10-CM

## 2013-01-16 DIAGNOSIS — I10 Essential (primary) hypertension: Secondary | ICD-10-CM

## 2013-01-29 ENCOUNTER — Telehealth: Payer: Self-pay | Admitting: Internal Medicine

## 2013-01-29 ENCOUNTER — Encounter: Payer: Self-pay | Admitting: Internal Medicine

## 2013-01-29 ENCOUNTER — Ambulatory Visit (INDEPENDENT_AMBULATORY_CARE_PROVIDER_SITE_OTHER): Payer: Medicare Other | Admitting: Internal Medicine

## 2013-01-29 VITALS — BP 140/80 | HR 62 | Temp 97.4°F | Resp 20 | Wt 166.0 lb

## 2013-01-29 DIAGNOSIS — K5730 Diverticulosis of large intestine without perforation or abscess without bleeding: Secondary | ICD-10-CM

## 2013-01-29 DIAGNOSIS — R5381 Other malaise: Secondary | ICD-10-CM | POA: Diagnosis not present

## 2013-01-29 DIAGNOSIS — I10 Essential (primary) hypertension: Secondary | ICD-10-CM

## 2013-01-29 DIAGNOSIS — Z86010 Personal history of colonic polyps: Secondary | ICD-10-CM | POA: Diagnosis not present

## 2013-01-29 DIAGNOSIS — D649 Anemia, unspecified: Secondary | ICD-10-CM | POA: Diagnosis not present

## 2013-01-29 LAB — CBC WITH DIFFERENTIAL/PLATELET
Basophils Absolute: 0 10*3/uL (ref 0.0–0.1)
Basophils Relative: 0.3 % (ref 0.0–3.0)
Eosinophils Absolute: 0.1 10*3/uL (ref 0.0–0.7)
Lymphocytes Relative: 22.4 % (ref 12.0–46.0)
MCHC: 33.7 g/dL (ref 30.0–36.0)
MCV: 87 fl (ref 78.0–100.0)
Monocytes Absolute: 0.9 10*3/uL (ref 0.1–1.0)
Neutrophils Relative %: 66.9 % (ref 43.0–77.0)
Platelets: 240 10*3/uL (ref 150.0–400.0)
RDW: 14.8 % — ABNORMAL HIGH (ref 11.5–14.6)

## 2013-01-29 LAB — COMPREHENSIVE METABOLIC PANEL
ALT: 13 U/L (ref 0–35)
AST: 27 U/L (ref 0–37)
Albumin: 3.8 g/dL (ref 3.5–5.2)
Alkaline Phosphatase: 66 U/L (ref 39–117)
Calcium: 9.5 mg/dL (ref 8.4–10.5)
Chloride: 98 mEq/L (ref 96–112)
Potassium: 3.6 mEq/L (ref 3.5–5.1)
Sodium: 136 mEq/L (ref 135–145)
Total Protein: 7.8 g/dL (ref 6.0–8.3)

## 2013-01-29 LAB — HEMOGLOBIN A1C: Hgb A1c MFr Bld: 6.1 % (ref 4.6–6.5)

## 2013-01-29 LAB — GLUCOSE, POCT (MANUAL RESULT ENTRY): POC Glucose: 90 mg/dl (ref 70–99)

## 2013-01-29 NOTE — Patient Instructions (Signed)
Limit your sodium (Salt) intake  Return in one month for follow-up 

## 2013-01-29 NOTE — Telephone Encounter (Signed)
Karen [name]/Patient [phone] called regarding blood sugar 300.  Reported dizziness/weakness to CSR but declined 911.  Line was busy X 3 on attempted call back.  No contact made.

## 2013-01-29 NOTE — Progress Notes (Signed)
Subjective:    Patient ID: Karen [name], female    DOB: December 03, 1922, 79 y.o.   MRN: [id number]  HPI  Pre-visit discussion using our clinic review tool. No additional management support is needed unless otherwise documented below in the visit note.  79 year old patient who is seen today with a chief complaint of fatigue. She also states that she checked her blood sugar at home today and was over 300. She used her husband's meter. She has no personal history of diabetes. She has been under considerable situational stress due to the poor health and dementia of her husband. She does have a prior history of anemia diverticulosis and history of melena.  Past Medical History  Diagnosis Date  . ANEMIA 10/21/2008  . AORTIC INSUFFICIENCY 11/26/2009  . BACK PAIN 06/27/2007  . COLON CANCER, PERSONAL HX 02/25/2010  . Diverticulosis of colon (without mention of hemorrhage) 08/14/2006  . GALLSTONES 10/09/2008  . GLAUCOMA NOS 08/14/2006  . HYPERTENSION 08/14/2006  . HYPOKALEMIA, HX OF 11/26/2009  . MELENA, HX OF 10/12/2008  . NEOPLASM, MALIGNANT, COLON, CECUM 10/30/2008  . OCCLUSION, CENTRAL RETINAL ARTERY 08/14/2006  . PERSONAL HX COLONIC POLYPS 10/21/2008  . SYNCOPE, HX OF 08/14/2006  . URI 09/07/2008  . H/O: stroke     behind right eye    History   Social History  . Marital Status: Married    Spouse Name: N/A    Number of Children: 3  . Years of Education: N/A   Occupational History  . retired    Social History Main Topics  . Smoking status: Never Smoker   . Smokeless tobacco: Never Used  . Alcohol Use: No  . Drug Use: No  . Sexual Activity: Not on file   Other Topics Concern  . Not on file   Social History Narrative  . No narrative on file    Past Surgical History  Procedure Laterality Date  . Appendectomy    . Cataract extraction      bilateral  . Dilation and curettage of uterus    . Tonsillectomy    . Colon surgery      11/20/2008,gallbladder removed  . Ankle fracture surgery   2011    right    Family History  Problem Relation Age of Onset  . Stroke Neg Hx   . Heart disease Neg Hx   . Breast cancer Sister   . Heart attack Father   . Lung cancer Brother     Allergies  Allergen Reactions  . Amoxicillin     REACTION: unspecified  . Ciprofloxacin   . Methocarbamol     REACTION: generalized rash and itching  . Sulfa Antibiotics Itching  . Trimpex [Trimethoprim] Rash    Current Outpatient Prescriptions on File Prior to Visit  Medication Sig Dispense Refill  . aspirin 81 MG tablet Take 1 tablet (81 mg total) by mouth daily.  90 tablet  6  . atenolol (TENORMIN) 50 MG tablet Take 1 tablet (50 mg total) by mouth daily.  90 tablet  6  . brimonidine (ALPHAGAN P) 0.1 % SOLN Apply 1 drop to eye every 8 (eight) hours.      . calcium carbonate (OS-CAL) 600 MG TABS Take 600 mg by mouth daily.        . diphenhydrAMINE (SOMINEX) 25 MG tablet Take 25 mg by mouth at bedtime as needed.      . dorzolamide-timolol (COSOPT) 22.3-6.8 MG/ML ophthalmic solution Place 1 drop into both eyes 2 (two) times daily.       Marland Kitchen  LUMIGAN 0.01 % SOLN Apply 1 drop to eye at bedtime.       . Omega-3 Fatty Acids (FISH OIL PO) Take 1 tablet by mouth daily.      Bertram Gala Glycol-Propyl Glycol (SYSTANE ULTRA) 0.4-0.3 % SOLN Apply 1 drop to eye as needed.       No current facility-administered medications on file prior to visit.    BP 140/80  Pulse 62  Temp(Src) 97.4 F (36.3 C) (Oral)  Resp 20  Wt 166 lb (75.297 kg)  SpO2 97%     Review of Systems  Constitutional: Positive for fatigue.  HENT: Negative for congestion, dental problem, hearing loss, rhinorrhea, sinus pressure, sore throat and tinnitus.   Eyes: Negative for pain, discharge and visual disturbance.  Respiratory: Negative for cough and shortness of breath.   Cardiovascular: Negative for chest pain, palpitations and leg swelling.  Gastrointestinal: Negative for nausea, vomiting, abdominal pain, diarrhea, constipation,  blood in stool and abdominal distention.  Genitourinary: Negative for dysuria, urgency, frequency, hematuria, flank pain, vaginal bleeding, vaginal discharge, difficulty urinating, vaginal pain and pelvic pain.  Musculoskeletal: Negative for arthralgias, gait problem and joint swelling.  Skin: Negative for rash.  Neurological: Positive for weakness. Negative for dizziness, syncope, speech difficulty, numbness and headaches.  Hematological: Negative for adenopathy.  Psychiatric/Behavioral: Negative for behavioral problems, dysphoric mood and agitation. The patient is not nervous/anxious.        Objective:   Physical Exam  Constitutional: She is oriented to person, place, and time. She appears well-developed and well-nourished.  HENT:  Head: Normocephalic.  Right Ear: External ear normal.  Left Ear: External ear normal.  Mouth/Throat: Oropharynx is clear and moist.  Eyes: Conjunctivae and EOM are normal. Pupils are equal, round, and reactive to light.  Neck: Normal range of motion. Neck supple. No thyromegaly present.  Cardiovascular: Normal rate, regular rhythm, normal heart sounds and intact distal pulses.   Pulmonary/Chest: Effort normal and breath sounds normal.  Abdominal: Soft. Bowel sounds are normal. She exhibits no mass. There is no tenderness.  Musculoskeletal: Normal range of motion.  Lymphadenopathy:    She has no cervical adenopathy.  Neurological: She is alert and oriented to person, place, and time.  Skin: Skin is warm and dry. No rash noted.  Psychiatric: She has a normal mood and affect. Her behavior is normal.          Assessment & Plan:   Fatigue. We'll check some updated labs. The patient does have a history of anemia. Suspect her fatigue is related to a situational stress and anxiety History of anemia and GI bleeding Hypertension  Lab  update recheck 1 month

## 2013-01-29 NOTE — Telephone Encounter (Signed)
Patient Information:  Caller Name: Sherlynn Stalls  Phone: [phone]  Patient: Takeysha, Bonk  Gender: Female  DOB: [birth year]  Age: 79 Years  PCP: Eleonore Chiquito (Family Practice > 35yrs old)  Office Follow Up:  Does the office need to follow up with this patient?: N/A  Instructions For The Office: N/A   Symptoms  Reason For Call & Symptoms: Pt calling, blood sugar 300 at 0730,  had pack of oatmeal and 1/2 banana after took blood sugar, did have potatoes at dinner last night and strawberry ice cream at dinner last night 11/4, lately been more tired than usual  Reviewed Health History In EMR: Yes  Reviewed Medications In EMR: Yes  Reviewed Allergies In EMR: Yes  Reviewed Surgeries / Procedures: Yes  Date of Onset of Symptoms: 01/29/2013  Guideline(s) Used:  Diabetes - High Blood Sugar  Disposition Per Guideline:   See Today in Office  Reason For Disposition Reached:   New-onset diabetes suspected (e.g., frequent urination, weak, weight loss)  Advice Given:  N/A  Patient Will Follow Care Advice:  YES  Appt scheduled 01/29/14 1115.

## 2013-01-29 NOTE — Telephone Encounter (Signed)
Noted  

## 2013-02-26 ENCOUNTER — Encounter: Payer: Self-pay | Admitting: Internal Medicine

## 2013-02-26 ENCOUNTER — Ambulatory Visit (INDEPENDENT_AMBULATORY_CARE_PROVIDER_SITE_OTHER): Payer: Medicare Other | Admitting: Internal Medicine

## 2013-02-26 VITALS — BP 150/84 | HR 66 | Temp 97.4°F | Resp 20 | Wt 169.0 lb

## 2013-02-26 DIAGNOSIS — I10 Essential (primary) hypertension: Secondary | ICD-10-CM | POA: Diagnosis not present

## 2013-02-26 DIAGNOSIS — R609 Edema, unspecified: Secondary | ICD-10-CM | POA: Diagnosis not present

## 2013-02-26 DIAGNOSIS — R600 Localized edema: Secondary | ICD-10-CM

## 2013-02-26 DIAGNOSIS — D649 Anemia, unspecified: Secondary | ICD-10-CM

## 2013-02-26 MED ORDER — FUROSEMIDE 20 MG PO TABS: ORAL_TABLET | ORAL | Status: DC

## 2013-02-26 NOTE — Patient Instructions (Signed)
Limit your sodium (Salt) intake  Return in 6 months for follow-up  

## 2013-02-26 NOTE — Progress Notes (Signed)
Pre-visit discussion using our clinic review tool. No additional management support is needed unless otherwise documented below in the visit note.  

## 2013-02-26 NOTE — Progress Notes (Signed)
Subjective:    Patient ID: Norm Parcel, female    DOB: 08/20/1922, 79 y.o.   MRN: [id number]  HPI Pre-visit discussion using our clinic review tool. No additional management support is needed unless otherwise documented below in the visit note.  79 year old patient who is in today accompanied by her husband for her biannual followup. She has done quite well but has had some mild lower extremity swelling. No other symptoms of heart failure. Medical regimen includes atenolol for blood pressure control.  Past Medical History  Diagnosis Date  . ANEMIA 10/21/2008  . AORTIC INSUFFICIENCY 11/26/2009  . BACK PAIN 06/27/2007  . COLON CANCER, PERSONAL HX 02/25/2010  . Diverticulosis of colon (without mention of hemorrhage) 08/14/2006  . GALLSTONES 10/09/2008  . GLAUCOMA NOS 08/14/2006  . HYPERTENSION 08/14/2006  . HYPOKALEMIA, HX OF 11/26/2009  . MELENA, HX OF 10/12/2008  . NEOPLASM, MALIGNANT, COLON, CECUM 10/30/2008  . OCCLUSION, CENTRAL RETINAL ARTERY 08/14/2006  . PERSONAL HX COLONIC POLYPS 10/21/2008  . SYNCOPE, HX OF 08/14/2006  . URI 09/07/2008  . H/O: stroke     behind right eye    History   Social History  . Marital Status: Married    Spouse Name: N/A    Number of Children: 3  . Years of Education: N/A   Occupational History  . retired    Social History Main Topics  . Smoking status: Never Smoker   . Smokeless tobacco: Never Used  . Alcohol Use: No  . Drug Use: No  . Sexual Activity: Not on file   Other Topics Concern  . Not on file   Social History Narrative  . No narrative on file    Past Surgical History  Procedure Laterality Date  . Appendectomy    . Cataract extraction      bilateral  . Dilation and curettage of uterus    . Tonsillectomy    . Colon surgery      11/20/2008,gallbladder removed  . Ankle fracture surgery  2011    right    Family History  Problem Relation Age of Onset  . Stroke Neg Hx   . Heart disease Neg Hx   . Breast cancer Sister   . Heart  attack Father   . Lung cancer Brother     Allergies  Allergen Reactions  . Amoxicillin     REACTION: unspecified  . Ciprofloxacin   . Methocarbamol     REACTION: generalized rash and itching  . Sulfa Antibiotics Itching  . Trimpex [Trimethoprim] Rash    Current Outpatient Prescriptions on File Prior to Visit  Medication Sig Dispense Refill  . aspirin 81 MG tablet Take 1 tablet (81 mg total) by mouth daily.  90 tablet  6  . atenolol (TENORMIN) 50 MG tablet Take 1 tablet (50 mg total) by mouth daily.  90 tablet  6  . brimonidine (ALPHAGAN P) 0.1 % SOLN Apply 1 drop to eye every 8 (eight) hours.      . calcium carbonate (OS-CAL) 600 MG TABS Take 600 mg by mouth daily.        . diphenhydrAMINE (SOMINEX) 25 MG tablet Take 25 mg by mouth at bedtime as needed.      . dorzolamide-timolol (COSOPT) 22.3-6.8 MG/ML ophthalmic solution Place 1 drop into both eyes 2 (two) times daily.       Marland Kitchen LUMIGAN 0.01 % SOLN Apply 1 drop to eye at bedtime.       . Omega-3 Fatty  Acids (FISH OIL PO) Take 1 tablet by mouth daily.      Bertram Gala Glycol-Propyl Glycol (SYSTANE ULTRA) 0.4-0.3 % SOLN Apply 1 drop to eye as needed.       No current facility-administered medications on file prior to visit.    BP 150/84  Pulse 66  Temp(Src) 97.4 F (36.3 C) (Oral)  Resp 20  Wt 169 lb (76.658 kg)  SpO2 96%       Review of Systems  Constitutional: Negative.   HENT: Negative for congestion, dental problem, hearing loss, rhinorrhea, sinus pressure, sore throat and tinnitus.   Eyes: Negative for pain, discharge and visual disturbance.  Respiratory: Negative for cough and shortness of breath.   Cardiovascular: Positive for leg swelling. Negative for chest pain and palpitations.  Gastrointestinal: Negative for nausea, vomiting, abdominal pain, diarrhea, constipation, blood in stool and abdominal distention.  Genitourinary: Negative for dysuria, urgency, frequency, hematuria, flank pain, vaginal bleeding,  vaginal discharge, difficulty urinating, vaginal pain and pelvic pain.  Musculoskeletal: Negative for arthralgias, gait problem and joint swelling.  Skin: Negative for rash.  Neurological: Negative for dizziness, syncope, speech difficulty, weakness, numbness and headaches.  Hematological: Negative for adenopathy.  Psychiatric/Behavioral: Negative for behavioral problems, dysphoric mood and agitation. The patient is not nervous/anxious.        Objective:   Physical Exam  Constitutional: She is oriented to person, place, and time. She appears well-developed and well-nourished.  HENT:  Head: Normocephalic.  Right Ear: External ear normal.  Left Ear: External ear normal.  Mouth/Throat: Oropharynx is clear and moist.  Eyes: Conjunctivae and EOM are normal. Pupils are equal, round, and reactive to light.  Neck: Normal range of motion. Neck supple. No thyromegaly present.  Cardiovascular: Normal rate, regular rhythm, normal heart sounds and intact distal pulses.   Pulmonary/Chest: Effort normal and breath sounds normal.  Abdominal: Soft. Bowel sounds are normal. She exhibits no mass. There is no tenderness.  Musculoskeletal: Normal range of motion. She exhibits edema.  Lymphadenopathy:    She has no cervical adenopathy.  Neurological: She is alert and oriented to person, place, and time.  Skin: Skin is warm and dry. No rash noted.  Psychiatric: She has a normal mood and affect. Her behavior is normal.          Assessment & Plan:   Hypertension well controlled Lower extremity edema. Patient will attempt to restrict her salt intake and elevate her legs as much as possible. Was given a prescription for furosemide 20 mg to take daily when necessary swelling  Recheck 6 months or as needed

## 2013-06-24 ENCOUNTER — Other Ambulatory Visit: Payer: Self-pay | Admitting: Internal Medicine

## 2013-06-27 ENCOUNTER — Encounter: Payer: Self-pay | Admitting: Oncology

## 2013-06-27 ENCOUNTER — Telehealth: Payer: Self-pay | Admitting: Oncology

## 2013-06-27 ENCOUNTER — Ambulatory Visit (HOSPITAL_BASED_OUTPATIENT_CLINIC_OR_DEPARTMENT_OTHER): Payer: Medicare Other | Admitting: Oncology

## 2013-06-27 ENCOUNTER — Other Ambulatory Visit (HOSPITAL_BASED_OUTPATIENT_CLINIC_OR_DEPARTMENT_OTHER): Payer: Medicare Other

## 2013-06-27 VITALS — BP 195/73 | HR 66 | Temp 98.0°F | Resp 20 | Ht 65.0 in | Wt 172.6 lb

## 2013-06-27 DIAGNOSIS — R599 Enlarged lymph nodes, unspecified: Secondary | ICD-10-CM

## 2013-06-27 DIAGNOSIS — C180 Malignant neoplasm of cecum: Secondary | ICD-10-CM | POA: Diagnosis not present

## 2013-06-27 LAB — CBC WITH DIFFERENTIAL/PLATELET
BASO%: 0.5 % (ref 0.0–2.0)
Basophils Absolute: 0 10*3/uL (ref 0.0–0.1)
EOS%: 2 % (ref 0.0–7.0)
Eosinophils Absolute: 0.1 10*3/uL (ref 0.0–0.5)
HCT: 39.2 % (ref 34.8–46.6)
HGB: 12.9 g/dL (ref 11.6–15.9)
LYMPH%: 25 % (ref 14.0–49.7)
MCH: 28.6 pg (ref 25.1–34.0)
MCHC: 32.9 g/dL (ref 31.5–36.0)
MCV: 87 fL (ref 79.5–101.0)
MONO#: 0.7 10*3/uL (ref 0.1–0.9)
MONO%: 10.3 % (ref 0.0–14.0)
NEUT#: 4.5 10*3/uL (ref 1.5–6.5)
NEUT%: 62.2 % (ref 38.4–76.8)
Platelets: 219 10*3/uL (ref 145–400)
RBC: 4.51 10*6/uL (ref 3.70–5.45)
RDW: 14 % (ref 11.2–14.5)
WBC: 7.2 10*3/uL (ref 3.9–10.3)
lymph#: 1.8 10*3/uL (ref 0.9–3.3)

## 2013-06-27 LAB — COMPREHENSIVE METABOLIC PANEL (CC13)
ALK PHOS: 71 U/L (ref 40–150)
ALT: 9 U/L (ref 0–55)
AST: 23 U/L (ref 5–34)
Albumin: 3.4 g/dL — ABNORMAL LOW (ref 3.5–5.0)
Anion Gap: 9 mEq/L (ref 3–11)
BILIRUBIN TOTAL: 0.34 mg/dL (ref 0.20–1.20)
BUN: 14.4 mg/dL (ref 7.0–26.0)
CO2: 25 mEq/L (ref 22–29)
CREATININE: 0.8 mg/dL (ref 0.6–1.1)
Calcium: 9.3 mg/dL (ref 8.4–10.4)
Chloride: 107 mEq/L (ref 98–109)
Glucose: 99 mg/dl (ref 70–140)
Potassium: 4.2 mEq/L (ref 3.5–5.1)
Sodium: 141 mEq/L (ref 136–145)
Total Protein: 7.5 g/dL (ref 6.4–8.3)

## 2013-06-27 NOTE — Progress Notes (Signed)
Hematology and Oncology Follow Up Visit  Karen [name] [id number] 12-22-1922 80 y.o. 06/27/2013 12:02 PM  CC: Karen Regal, MD  Karen [name]. Olevia Perches, MD  Marletta Lor, MD    Principle Diagnosis: An 80 year old female with the following issues:  1. Stage II colorectal cancer diagnosed in 2010.  She had a T3 N0 disease, status post surgical resection.  2. Questionable inflammatory versus reactive lymphadenopathy that is PET negative.   Prior Therapy: She underwent right hemicolectomy on a T3 N0 disease.  Eleven lymph nodes sampled, 0 involved.  That was done on November 20, 2008.  Current therapy: Observation and suervillance  Interim History:  [name] presents today for a followup visit.  She is a pleasant 80 year old woman with stage II colorectal cancer.  She had a T3 N0 disease with 11 lymph nodes sampled. She is close to 5 years out from her surgical resection. She had refused adjuvant chemotherapy and continued to be  without any evidence of recurrent disease.  She has also had an incidental finding of lymphadenopathy that probably appeared to be reactive in nature.  Since the last time I saw her, she had not reported really any major problems.  Did not report any hospitalization.  Did not report really any illnesses.  She is performing most activities of daily living without any hindrance or decline. Her husband passed away in 05/06/22 and she is still grieving over his loss. She feels that she is adjusting well but she still have some difficult time especially at nighttime. [facility] reports [facility] her coping at this time. She has not reported any GI symptoms including hematochezia or melena. Did not report any nausea or vomiting.  Medications: I have reviewed the patient's current medications. Current Outpatient Prescriptions  Medication Sig Dispense Refill  . aspirin 81 MG tablet Take 1 tablet (81 mg total) by mouth daily.  90 tablet  6  . atenolol  (TENORMIN) 50 MG tablet TAKE ONE TABLET BY MOUTH ONCE DAILY  90 tablet  0  . brimonidine (ALPHAGAN P) 0.1 % SOLN Apply 1 drop to eye every 8 (eight) hours.      . calcium carbonate (OS-CAL) 600 MG TABS Take 600 mg by mouth daily.        . dorzolamide-timolol (COSOPT) 22.3-6.8 MG/ML ophthalmic solution Place 1 drop into both eyes 2 (two) times daily.       . furosemide (LASIX) 20 MG tablet 1 tablet daily as needed for swelling  30 tablet  3  . LUMIGAN 0.01 % SOLN Apply 1 drop to eye at bedtime.       Karen [name] Glycol-Propyl Glycol (SYSTANE ULTRA) 0.4-0.3 % SOLN Apply 1 drop to eye as needed.      . diphenhydrAMINE (SOMINEX) 25 MG tablet Take 25 mg by mouth at bedtime as needed.      . hydrochlorothiazide (HYDRODIURIL) 25 MG tablet Take 25 mg by mouth daily.       No current facility-administered medications for this visit.    Allergies:  Allergies  Allergen Reactions  . Amoxicillin     REACTION: unspecified  . Ciprofloxacin   . Methocarbamol     REACTION: generalized rash and itching  . Sulfa Antibiotics Itching  . Trimpex [Trimethoprim] Rash    Past Medical History, Surgical history, Social history, and Family History were reviewed and updated.  Review of Systems: Constitutional:  Negative for fever, chills, night sweats, anorexia, weight loss, pain.  Cardiovascular: no chest pain or dyspnea on exertion Respiratory: no cough, shortness of breath, or wheezing Gastrointestinal: no abdominal pain, change in bowel habits, or black or bloody stools  Remaining ROS negative. Physical Exam: Blood pressure 195/73, pulse 66, temperature 98 F (36.7 C), temperature source Oral, resp. rate 20, height 5\' 5"  (1.651 m), weight 172 lb 9.6 oz (78.291 kg). ECOG: 1 General appearance: alert awake appeared in no active distress Head: Normocephalic, without obvious abnormality, atraumatic Neck: no adenopathy, no carotid bruit, no JVD, supple, symmetrical, trachea midline and thyroid not enlarged,  symmetric, no tenderness/mass/nodules Lymph nodes: Cervical, supraclavicular, and axillary nodes normal. Heart:regular rate and rhythm, S1, S2 normal, no murmur, click, rub or gallop Lung:chest clear, no wheezing, rales, normal symmetric air entry Abdomin: soft, non-tender, without masses or organomegaly EXT:no erythema, induration, or nodules   Lab Results: Lab Results  Component Value Date   WBC 7.2 06/27/2013   HGB 12.9 06/27/2013   HCT 39.2 06/27/2013   MCV 87.0 06/27/2013   PLT 219 06/27/2013     Chemistry      Component Value Date/Time   NA 136 01/29/2013 1206   NA 141 06/24/2012 0932   NA 145 12/13/2010 1053   K 3.6 01/29/2013 1206   K 4.0 06/24/2012 0932   K 4.0 12/13/2010 1053   CL 98 01/29/2013 1206   CL 103 06/24/2012 0932   CL 102 12/13/2010 1053   CO2 30 01/29/2013 1206   CO2 27 06/24/2012 0932   CO2 27 12/13/2010 1053   BUN 16 01/29/2013 1206   BUN 13.8 06/24/2012 0932   BUN 15 12/13/2010 1053   CREATININE 0.7 01/29/2013 1206   CREATININE 0.8 06/24/2012 0932   CREATININE 0.8 12/13/2010 1053      Component Value Date/Time   CALCIUM 9.5 01/29/2013 1206   CALCIUM 9.4 06/24/2012 0932   CALCIUM 9.1 12/13/2010 1053   ALKPHOS 66 01/29/2013 1206   ALKPHOS 70 06/24/2012 0932   ALKPHOS 66 12/13/2010 1053   AST 27 01/29/2013 1206   AST 24 06/24/2012 0932   AST 22 12/13/2010 1053   ALT 13 01/29/2013 1206   ALT 10 06/24/2012 0932   ALT 16 12/13/2010 1053   BILITOT 0.4 01/29/2013 1206   BILITOT 0.35 06/24/2012 0932   BILITOT 0.50 12/13/2010 1053      Impression and Plan:  This is a pleasant 80 year old woman with the following issues:  1. Stage II colorectal cancer.  She has really no evidence to suggest recurrent or relapsed disease at this time. CT scan  from 06/24/2012 showed any evidence of relapsed disease and there were is no reason to repeat it at this time.The plan is to continue to be on active surveillance.  She will continue follow up in 12 months' time for physical examination and  laboratory only. We will repeat CT scan as needed.  2. Reactive lymphadenopathy.  Again I feel that this is probably an incidental finding and does not reflect malignancy.  No further workup is necessary.  3.     Colon cancer screening.  She is up-to-date at this point for colon cancer screening.    [facility], MD 4/3/201512:02 PM

## 2013-06-27 NOTE — Telephone Encounter (Signed)
gv and printed appt sched and avs for pt for April 2016 °

## 2013-06-28 LAB — CEA: CEA: 3.2 ng/mL (ref 0.0–5.0)

## 2013-07-17 DIAGNOSIS — H35039 Hypertensive retinopathy, unspecified eye: Secondary | ICD-10-CM | POA: Diagnosis not present

## 2013-07-17 DIAGNOSIS — H21239 Degeneration of iris (pigmentary), unspecified eye: Secondary | ICD-10-CM | POA: Diagnosis not present

## 2013-07-17 DIAGNOSIS — H353190 Nonexudative age-related macular degeneration, unspecified eye, stage unspecified: Secondary | ICD-10-CM | POA: Diagnosis not present

## 2013-07-17 DIAGNOSIS — H4011X0 Primary open-angle glaucoma, stage unspecified: Secondary | ICD-10-CM | POA: Diagnosis not present

## 2013-07-17 DIAGNOSIS — H401290 Low-tension glaucoma, unspecified eye, stage unspecified: Secondary | ICD-10-CM | POA: Diagnosis not present

## 2013-07-17 DIAGNOSIS — H348392 Tributary (branch) retinal vein occlusion, unspecified eye, stable: Secondary | ICD-10-CM | POA: Diagnosis not present

## 2013-08-07 ENCOUNTER — Other Ambulatory Visit: Payer: Self-pay | Admitting: Internal Medicine

## 2013-09-04 ENCOUNTER — Ambulatory Visit (INDEPENDENT_AMBULATORY_CARE_PROVIDER_SITE_OTHER): Payer: Medicare Other | Admitting: Internal Medicine

## 2013-09-04 ENCOUNTER — Ambulatory Visit: Payer: Medicare Other | Admitting: Internal Medicine

## 2013-09-04 ENCOUNTER — Encounter: Payer: Self-pay | Admitting: Internal Medicine

## 2013-09-04 VITALS — BP 150/80 | HR 65 | Temp 97.9°F | Resp 20 | Ht 65.0 in | Wt 170.0 lb

## 2013-09-04 DIAGNOSIS — Z85038 Personal history of other malignant neoplasm of large intestine: Secondary | ICD-10-CM

## 2013-09-04 DIAGNOSIS — I10 Essential (primary) hypertension: Secondary | ICD-10-CM | POA: Diagnosis not present

## 2013-09-04 DIAGNOSIS — M549 Dorsalgia, unspecified: Secondary | ICD-10-CM | POA: Diagnosis not present

## 2013-09-04 NOTE — Patient Instructions (Signed)

## 2013-09-04 NOTE — Progress Notes (Signed)
Pre-visit discussion using our clinic review tool. No additional management support is needed unless otherwise documented below in the visit note.  

## 2013-09-04 NOTE — Progress Notes (Signed)
Subjective:    Patient ID: Karen [name], female    DOB: 1922-05-27, 80 y.o.   MRN: [id number]  HPI  80 year old patient who is in today for her biannual followup. She is adjusting to the death of her husband approximately 4 months ago. Medical problems include hypertension.  She does have a history of mild AI, osteoarthritis with low back pain.  She has remote history of colon cancer. She has lower extremity edema and does take furosemide when necessary. She is doing quite well and excited about a [place] cruise next week.  She resides in an assisted-living facility.  No new concerns or complaints.  Past Medical History  Diagnosis Date  . ANEMIA 10/21/2008  . AORTIC INSUFFICIENCY 11/26/2009  . BACK PAIN 06/27/2007  . COLON CANCER, PERSONAL HX 02/25/2010  . Diverticulosis of colon (without mention of hemorrhage) 08/14/2006  . GALLSTONES 10/09/2008  . GLAUCOMA NOS 08/14/2006  . HYPERTENSION 08/14/2006  . HYPOKALEMIA, HX OF 11/26/2009  . MELENA, HX OF 10/12/2008  . NEOPLASM, MALIGNANT, COLON, CECUM 10/30/2008  . OCCLUSION, CENTRAL RETINAL ARTERY 08/14/2006  . PERSONAL HX COLONIC POLYPS 10/21/2008  . SYNCOPE, HX OF 08/14/2006  . URI 09/07/2008  . H/O: stroke     behind right eye    History   Social History  . Marital Status: Married    Spouse Name: N/A    Number of Children: 3  . Years of Education: N/A   Occupational History  . retired    Social History Main Topics  . Smoking status: Never Smoker   . Smokeless tobacco: Never Used  . Alcohol Use: No  . Drug Use: No  . Sexual Activity: Not on file   Other Topics Concern  . Not on file   Social History Narrative  . No narrative on file    Past Surgical History  Procedure Laterality Date  . Appendectomy    . Cataract extraction      bilateral  . Dilation and curettage of uterus    . Tonsillectomy    . Colon surgery      11/20/2008,gallbladder removed  . Ankle fracture surgery  2011    right    Family History    Problem Relation Age of Onset  . Stroke Neg Hx   . Heart disease Neg Hx   . Breast cancer Sister   . Heart attack Father   . Lung cancer Brother     Allergies  Allergen Reactions  . Amoxicillin     REACTION: unspecified  . Ciprofloxacin   . Methocarbamol     REACTION: generalized rash and itching  . Sulfa Antibiotics Itching  . Trimpex [Trimethoprim] Rash    Current Outpatient Prescriptions on File Prior to Visit  Medication Sig Dispense Refill  . aspirin 81 MG tablet Take 1 tablet (81 mg total) by mouth daily.  90 tablet  6  . atenolol (TENORMIN) 50 MG tablet TAKE ONE TABLET BY MOUTH ONCE DAILY  90 tablet  0  . brimonidine (ALPHAGAN P) 0.1 % SOLN Apply 1 drop to eye every 8 (eight) hours.      . calcium carbonate (OS-CAL) 600 MG TABS Take 600 mg by mouth daily.        . diphenhydrAMINE (SOMINEX) 25 MG tablet Take 25 mg by mouth at bedtime as needed.      . dorzolamide-timolol (COSOPT) 22.3-6.8 MG/ML ophthalmic solution Place 1 drop into both eyes 2 (two) times daily.       Marland Kitchen  furosemide (LASIX) 20 MG tablet TAKE ONE TABLET BY MOUTH ONCE DAILY AS NEEDED FOR SWELLING  30 tablet  2  . hydrochlorothiazide (HYDRODIURIL) 25 MG tablet Take 25 mg by mouth daily.      Marland Kitchen LUMIGAN 0.01 % SOLN Apply 1 drop to eye at bedtime.       Vladimir Faster Glycol-Propyl Glycol (SYSTANE ULTRA) 0.4-0.3 % SOLN Apply 1 drop to eye as needed.       No current facility-administered medications on file prior to visit.    BP 150/80  Pulse 65  Temp(Src) 97.9 F (36.6 C) (Oral)  Resp 20  Ht 5\' 5"  (1.651 m)  Wt 170 lb (77.111 kg)  BMI 28.29 kg/m2  SpO2 98%       Review of Systems  Cardiovascular: Positive for leg swelling.       Objective:   Physical Exam  Constitutional: She is oriented to person, place, and time. She appears well-developed and well-nourished.  A pressure 140 over 80  HENT:  Head: Normocephalic.  Right Ear: External ear normal.  Left Ear: External ear normal.   Mouth/Throat: Oropharynx is clear and moist.  Eyes: Conjunctivae and EOM are normal. Pupils are equal, round, and reactive to light.  Neck: Normal range of motion. Neck supple. No thyromegaly present.  Cardiovascular: Normal rate, regular rhythm, normal heart sounds and intact distal pulses.   Pulmonary/Chest: Effort normal and breath sounds normal.  Abdominal: Soft. Bowel sounds are normal. She exhibits no mass. There is no tenderness.  Musculoskeletal: Normal range of motion. She exhibits no edema.  Lymphadenopathy:    She has no cervical adenopathy.  Neurological: She is alert and oriented to person, place, and time.  Skin: Skin is warm and dry. No rash noted.  Psychiatric: She has a normal mood and affect. Her behavior is normal.          Assessment & Plan:   Hypertension History of mild aortic insufficiency.  No murmur appreciated today Osteoarthritis History of pedal edema.  Stable Remote colon cancer  CPX in 6 months

## 2013-09-05 ENCOUNTER — Telehealth: Payer: Self-pay | Admitting: Internal Medicine

## 2013-09-05 NOTE — Telephone Encounter (Signed)
Relevant patient education mailed to patient.  

## 2013-09-22 ENCOUNTER — Other Ambulatory Visit: Payer: Self-pay | Admitting: Internal Medicine

## 2013-09-22 DIAGNOSIS — H409 Unspecified glaucoma: Secondary | ICD-10-CM | POA: Diagnosis not present

## 2013-09-22 DIAGNOSIS — H35039 Hypertensive retinopathy, unspecified eye: Secondary | ICD-10-CM | POA: Diagnosis not present

## 2013-09-22 DIAGNOSIS — H401290 Low-tension glaucoma, unspecified eye, stage unspecified: Secondary | ICD-10-CM | POA: Diagnosis not present

## 2013-09-22 DIAGNOSIS — H21239 Degeneration of iris (pigmentary), unspecified eye: Secondary | ICD-10-CM | POA: Diagnosis not present

## 2013-09-22 DIAGNOSIS — H4011X0 Primary open-angle glaucoma, stage unspecified: Secondary | ICD-10-CM | POA: Diagnosis not present

## 2013-10-08 DIAGNOSIS — H401290 Low-tension glaucoma, unspecified eye, stage unspecified: Secondary | ICD-10-CM | POA: Diagnosis not present

## 2013-10-08 DIAGNOSIS — H21239 Degeneration of iris (pigmentary), unspecified eye: Secondary | ICD-10-CM | POA: Diagnosis not present

## 2013-10-08 DIAGNOSIS — H4011X0 Primary open-angle glaucoma, stage unspecified: Secondary | ICD-10-CM | POA: Diagnosis not present

## 2013-10-22 DIAGNOSIS — H4011X0 Primary open-angle glaucoma, stage unspecified: Secondary | ICD-10-CM | POA: Diagnosis not present

## 2013-10-22 DIAGNOSIS — H409 Unspecified glaucoma: Secondary | ICD-10-CM | POA: Diagnosis not present

## 2013-10-22 DIAGNOSIS — H21239 Degeneration of iris (pigmentary), unspecified eye: Secondary | ICD-10-CM | POA: Diagnosis not present

## 2014-01-15 DIAGNOSIS — Z23 Encounter for immunization: Secondary | ICD-10-CM | POA: Diagnosis not present

## 2014-01-21 ENCOUNTER — Ambulatory Visit (INDEPENDENT_AMBULATORY_CARE_PROVIDER_SITE_OTHER): Payer: Medicare Other | Admitting: Ophthalmology

## 2014-01-21 DIAGNOSIS — H3531 Nonexudative age-related macular degeneration: Secondary | ICD-10-CM | POA: Diagnosis not present

## 2014-01-21 DIAGNOSIS — H34233 Retinal artery branch occlusion, bilateral: Secondary | ICD-10-CM

## 2014-01-21 DIAGNOSIS — H35033 Hypertensive retinopathy, bilateral: Secondary | ICD-10-CM | POA: Diagnosis not present

## 2014-01-21 DIAGNOSIS — H43813 Vitreous degeneration, bilateral: Secondary | ICD-10-CM

## 2014-01-21 DIAGNOSIS — I10 Essential (primary) hypertension: Secondary | ICD-10-CM

## 2014-02-16 DIAGNOSIS — H401290 Low-tension glaucoma, unspecified eye, stage unspecified: Secondary | ICD-10-CM | POA: Diagnosis not present

## 2014-02-16 DIAGNOSIS — H4010X0 Unspecified open-angle glaucoma, stage unspecified: Secondary | ICD-10-CM | POA: Diagnosis not present

## 2014-02-17 DIAGNOSIS — H4010X0 Unspecified open-angle glaucoma, stage unspecified: Secondary | ICD-10-CM | POA: Diagnosis not present

## 2014-03-02 DIAGNOSIS — H4010X3 Unspecified open-angle glaucoma, severe stage: Secondary | ICD-10-CM | POA: Diagnosis not present

## 2014-03-02 DIAGNOSIS — H3530 Unspecified macular degeneration: Secondary | ICD-10-CM | POA: Diagnosis not present

## 2014-03-02 DIAGNOSIS — H4011X3 Primary open-angle glaucoma, severe stage: Secondary | ICD-10-CM | POA: Diagnosis not present

## 2014-03-02 DIAGNOSIS — H3531 Nonexudative age-related macular degeneration: Secondary | ICD-10-CM | POA: Diagnosis not present

## 2014-03-02 DIAGNOSIS — H34831 Tributary (branch) retinal vein occlusion, right eye: Secondary | ICD-10-CM | POA: Diagnosis not present

## 2014-03-02 DIAGNOSIS — Z961 Presence of intraocular lens: Secondary | ICD-10-CM | POA: Diagnosis not present

## 2014-03-04 ENCOUNTER — Encounter: Payer: Self-pay | Admitting: *Deleted

## 2014-03-04 ENCOUNTER — Telehealth: Payer: Self-pay | Admitting: *Deleted

## 2014-03-04 ENCOUNTER — Ambulatory Visit (INDEPENDENT_AMBULATORY_CARE_PROVIDER_SITE_OTHER): Payer: Medicare Other | Admitting: Family Medicine

## 2014-03-04 VITALS — BP 118/80 | HR 63 | Temp 97.4°F | Ht 65.0 in | Wt 168.6 lb

## 2014-03-04 DIAGNOSIS — Z01818 Encounter for other preprocedural examination: Secondary | ICD-10-CM | POA: Diagnosis not present

## 2014-03-04 NOTE — Telephone Encounter (Signed)
Nancy from [facility] called the office and informed Malachy Mood for the eye surgery the pt will receive Versed, retro bulbar block with Lidocaine, Marcaine, Wydase-any questions please call [phone].

## 2014-03-04 NOTE — Progress Notes (Addendum)
No chief complaint on file.   HPI:  Karen [name] is an 80 yo F patient of Dr. Burnice Logan here for optimization of general medical care prior to surgery. On review of chart she has a history of HTN, colon cancer, ? AI and ? HF - no echo or staging information in notes that I can find, intermittent LE edema, ocular stroke and back pain. Patient reports having urgent eye surgery and reports she may lose vision is surgery not done.  Surgery type: trabeculectomy with retrobulbar block no general anesthesia per form from optho office Date of surgery: 03/10/14  Kidney disease? No Prior surgeries/Issues following anesthesia? Multiple surgeries in the past including tonsillectomy, ankle surgery, appendectomy, colon surgery in 2010 and has always done well without any complications Hx MI, heart arrythmia, CHF, angina or stroke? Mention of possible HF in notes, but not on dx list, AI, she reports mild swelling in ankle she had surgery on intermittently, hx of ocular stroke Epilepsy or Seizures? no Arthritis or problems with neck or jaw? None per her report Thyroid disease? None per her report Liver disease? None per her report Asthma, COPD or chronic lung disease? None per her report Diabetes? None per her report  Other: Poor nutrition, Frail or other: no  METS:  ?Can take care of self, such as eat, dress, or use the toilet (1 MET). yes ?Can walk up a flight of steps or a hill (4 METs).yes ?Can do heavy work around the house such as scrubbing floors or lifting or moving heavy furniture (between 4 and 10 METs). Walks the halls at facility, stooping and bending is difficult due to her ankle ?Can participate in strenuous sports such as swimming, singles tennis, football, basketball, and skiing (>10 METs) No . AHA Risks: Major predictors that require intensive management and may lead to delay in or cancellation of the operative procedure unless emergent: none   Other clinical predictors that  warrant careful assessment of current status:  ? Hx mild HF and hx ocular stroke  Type of surgery and Risk:  Low risk (reported risk of cardiac death or nonfatal MI generally less than 1 percent):  [facility] Ambulatory surgery  . Endoscopic procedures  . Superficial procedure  . Cataract surgery  . Breast surgery  Medications that need to be addressed prior to surgery:  Discontinue acei/arbs/non-statin lipid lowering drugs day of surgery ASA stop 7 days before or discuss with cardiology if CV risks, other anticoagulants discuss with cardiology.  ROS: See pertinent positives and negatives per HPI. 11 point ROS negative except where noted. Denies: CP, palpitations, SOB, DOE, orthopnea, fevers, malaise,recent weight loss, bleeding gums, bleeding bowels or other bleeding, skin rashes, lymphadenopathy, dysuria, cough, congestion, HA, NVD, changes in bowel.  Past Medical History  Diagnosis Date  . ANEMIA 10/21/2008  . AORTIC INSUFFICIENCY 11/26/2009  . BACK PAIN 06/27/2007  . COLON CANCER, PERSONAL HX 02/25/2010  . Diverticulosis of colon (without mention of hemorrhage) 08/14/2006  . GALLSTONES 10/09/2008  . GLAUCOMA NOS 08/14/2006  . HYPERTENSION 08/14/2006  . HYPOKALEMIA, HX OF 11/26/2009  . MELENA, HX OF 10/12/2008  . NEOPLASM, MALIGNANT, COLON, CECUM 10/30/2008  . OCCLUSION, CENTRAL RETINAL ARTERY 08/14/2006  . PERSONAL HX COLONIC POLYPS 10/21/2008  . SYNCOPE, HX OF 08/14/2006  . URI 09/07/2008  . H/O: stroke     behind right eye    Past Surgical History  Procedure Laterality Date  . Appendectomy    . Cataract extraction      bilateral  .  Dilation and curettage of uterus    . Tonsillectomy    . Colon surgery      11/20/2008,gallbladder removed  . Ankle fracture surgery  2011    right    Family History  Problem Relation Age of Onset  . Stroke Neg Hx   . Heart disease Neg Hx   . Breast cancer Sister   . Heart attack Father   . Lung cancer Brother     History   Social History  . Marital  Status: Married    Spouse Name: N/A    Number of Children: 3  . Years of Education: N/A   Occupational History  . retired    Social History Main Topics  . Smoking status: Never Smoker   . Smokeless tobacco: Never Used  . Alcohol Use: No  . Drug Use: No  . Sexual Activity: Not on file   Other Topics Concern  . Not on file   Social History Narrative  . No narrative on file    Current outpatient prescriptions: aspirin 81 MG tablet, Take 1 tablet (81 mg total) by mouth daily., Disp: 90 tablet, Rfl: 6;  atenolol (TENORMIN) 50 MG tablet, TAKE ONE TABLET BY MOUTH ONCE DAILY, Disp: 90 tablet, Rfl: 1;  brimonidine (ALPHAGAN P) 0.1 % SOLN, Apply 1 drop to eye every 8 (eight) hours., Disp: , Rfl: ;  calcium carbonate (OS-CAL) 600 MG TABS, Take 600 mg by mouth daily.  , Disp: , Rfl:  diphenhydrAMINE (SOMINEX) 25 MG tablet, Take 25 mg by mouth at bedtime as needed., Disp: , Rfl: ;  dorzolamide-timolol (COSOPT) 22.3-6.8 MG/ML ophthalmic solution, Place 1 drop into both eyes 2 (two) times daily. , Disp: , Rfl: ;  furosemide (LASIX) 20 MG tablet, TAKE ONE TABLET BY MOUTH ONCE DAILY AS NEEDED FOR SWELLING, Disp: 30 tablet, Rfl: 2;  hydrochlorothiazide (HYDRODIURIL) 25 MG tablet, Take 25 mg by mouth daily., Disp: , Rfl:  LUMIGAN 0.01 % SOLN, Apply 1 drop to eye at bedtime. , Disp: , Rfl: ;  Polyethyl Glycol-Propyl Glycol (SYSTANE ULTRA) 0.4-0.3 % SOLN, Apply 1 drop to eye as needed., Disp: , Rfl:   EXAM:  Filed Vitals:   03/04/14 0939  BP: 118/80  Pulse: 63  Temp: 97.4 F (36.3 C)    Body mass index is 28.06 kg/(m^2).  GENERAL: vitals reviewed and listed above, alert, oriented, appears well hydrated and in no acute distress  HEENT: atraumatic, conjunttiva clear, no obvious abnormalities on inspection of external nose and ears  NECK: no obvious masses on inspection, no carotid bruits, no JVD  LUNGS: clear to auscultation bilaterally, no wheezes, rales or rhonchi, good air movement  CV:  HRRR, no peripheral edema, no JVD, BP normal range, normal radial pulses, no murmur today  MS: moves all extremities without noticeable abnormality  PSYCH: pleasant and cooperative, no obvious depression or anxiety  ASSESSMENT AND PLAN:  Discussed the following assessment and plan:  Preop examination  Assessment: -Risk factors: ? Mild HF - though pt denies knowledge of this and ocular stroke - she is have eye surgery so her surgeon is aware of this -Surgery Risks:low risk surgery with only local anesthesia per form from surgery office -age, nutritional status, fraility: good nutritional status, age >77, no fraility -functional capacity: good - likely around 4-6, walks on a regular basis and denies any SOB or DOE with this, limited in stooping and bending due to ankle issues  -comorbidities: ? Mld HF, AI, hx ocular stroke Patient  Specific Risks: patient is at increase risks if general anesthesia due to age and a few health issues that seem to be stable and well controlled for low risks surgery   Recommendations for optimizing general medical care prior to surgery: -advised patient to discuss specific risks morbidity and mortality of surgery with surgeon, CV risks discussed with patient  -advised patient will defer to surgeon for post-op DVT prophylaxis and post op care -I advise she have and CBC and BMP prior to surgery with surgery office if they feel indicated -I advised she should stop her aspirin 7 days prior to surgery  -form/letter for pre-op optimization of general medical care prior to surgery faxed to surgeon office -letter provided to patient with these instructions as well -no further testing advised given local anesthesia per form - advised in letter to surgeon office if general anesthesia advise pre-op eval with anesthesiologist      -Patient advised to return or notify a doctor immediately if symptoms worsen or persist or new concerns arise.  Patient Instructions  To  Whom it may concern:  The following patient, Karen [name], DOB: 06-23-1922, was seen in our office on the following date, 03/04/2014, for optimization of medical care prior to surgery. I explained that I do not "clear" an individual for surgery as I feel that wording is misleading given that all surgeries carry a certain degree of risk regardless of the surgery or the individual's current state. Rather, we have assisted the patient in better understanding general risks associated with surgery in light of his/her current state of health and have made recommendations to help optimized his/her general medical care prior to surgery. Please see the attached office visit note and below for the recommendations we advised.  Recommendations for optimizing general medical care prior to surgery: -advised patient to discuss specific risks morbidity and mortality of surgery with surgeon, CV risks discussed with patient  -advised patient will defer to surgeon for post-op DVT prophylaxis and post op care -I advise she have and CBC and BMP prior to surgery with surgery office if they feel indicated -I advised she should stop her aspirin 7 days prior to surgery  -anesthesia pre-op visit if general anesthesia but per form only local anesthetic planned -letter provided to patient with these instructions as well   Sincerely,    Colin Benton, DO     [facility], [place]

## 2014-03-04 NOTE — Patient Instructions (Signed)
To Whom it may concern:  The following patient, Karen [name], DOB: 1922-05-17, was seen in our office on the following date, 03/04/2014, for optimization of medical care prior to surgery. I explained that I do not "clear" an individual for surgery as I feel that wording is misleading given that all surgeries carry a certain degree of risk regardless of the surgery or the individual's current state. Rather, we have assisted the patient in better understanding general risks associated with surgery in light of his/her current state of health and have made recommendations to help optimized his/her general medical care prior to surgery. Please see the attached office visit note and below for the recommendations we advised.  Recommendations for optimizing general medical care prior to surgery: -advised patient to discuss specific risks morbidity and mortality of surgery with surgeon, CV risks discussed with patient  -advised patient will defer to surgeon for post-op DVT prophylaxis and post op care -I advise she have and CBC and BMP prior to surgery with surgery office if they feel indicated -I advised she should stop her aspirin 7 days prior to surgery  -anesthesia pre-op visit if general anesthesia but per form only local anesthetic planned -letter provided to patient with these instructions as well   Sincerely,    Colin Benton, DO

## 2014-03-04 NOTE — Progress Notes (Signed)
Pre visit review using our clinic review tool, if applicable. No additional management support is needed unless otherwise documented below in the visit note. 

## 2014-03-05 DIAGNOSIS — H4011X3 Primary open-angle glaucoma, severe stage: Secondary | ICD-10-CM | POA: Diagnosis not present

## 2014-03-05 NOTE — Telephone Encounter (Addendum)
Form sent with pt for preop only noted local block. Advised per letter sent to surgeon and with pt if change to plans and general anesthesia preop eval with anesthesiologist.

## 2014-03-10 ENCOUNTER — Encounter: Payer: Medicare Other | Admitting: Internal Medicine

## 2014-03-10 DIAGNOSIS — H4011X0 Primary open-angle glaucoma, stage unspecified: Secondary | ICD-10-CM | POA: Diagnosis not present

## 2014-03-10 DIAGNOSIS — H409 Unspecified glaucoma: Secondary | ICD-10-CM | POA: Diagnosis not present

## 2014-03-16 ENCOUNTER — Inpatient Hospital Stay (HOSPITAL_COMMUNITY)
Admission: EM | Admit: 2014-03-16 | Discharge: 2014-03-17 | DRG: 305 | Disposition: A | Discharge status: Home / Self Care | Payer: Medicare Other | Attending: Internal Medicine | Admitting: Internal Medicine

## 2014-03-16 ENCOUNTER — Encounter (HOSPITAL_COMMUNITY): Payer: Self-pay | Admitting: Emergency Medicine

## 2014-03-16 ENCOUNTER — Emergency Department (HOSPITAL_COMMUNITY): Payer: Medicare Other

## 2014-03-16 DIAGNOSIS — Z86010 Personal history of colonic polyps: Secondary | ICD-10-CM | POA: Diagnosis not present

## 2014-03-16 DIAGNOSIS — Z9841 Cataract extraction status, right eye: Secondary | ICD-10-CM | POA: Diagnosis not present

## 2014-03-16 DIAGNOSIS — I351 Nonrheumatic aortic (valve) insufficiency: Secondary | ICD-10-CM | POA: Diagnosis present

## 2014-03-16 DIAGNOSIS — R0602 Shortness of breath: Secondary | ICD-10-CM | POA: Diagnosis not present

## 2014-03-16 DIAGNOSIS — R0600 Dyspnea, unspecified: Secondary | ICD-10-CM | POA: Diagnosis not present

## 2014-03-16 DIAGNOSIS — Z85038 Personal history of other malignant neoplasm of large intestine: Secondary | ICD-10-CM | POA: Diagnosis not present

## 2014-03-16 DIAGNOSIS — Z7982 Long term (current) use of aspirin: Secondary | ICD-10-CM

## 2014-03-16 DIAGNOSIS — Z882 Allergy status to sulfonamides status: Secondary | ICD-10-CM | POA: Diagnosis not present

## 2014-03-16 DIAGNOSIS — Z9049 Acquired absence of other specified parts of digestive tract: Secondary | ICD-10-CM | POA: Diagnosis present

## 2014-03-16 DIAGNOSIS — Z881 Allergy status to other antibiotic agents status: Secondary | ICD-10-CM

## 2014-03-16 DIAGNOSIS — I10 Essential (primary) hypertension: Principal | ICD-10-CM | POA: Diagnosis present

## 2014-03-16 DIAGNOSIS — Z9842 Cataract extraction status, left eye: Secondary | ICD-10-CM

## 2014-03-16 DIAGNOSIS — H409 Unspecified glaucoma: Secondary | ICD-10-CM | POA: Diagnosis present

## 2014-03-16 DIAGNOSIS — Z8673 Personal history of transient ischemic attack (TIA), and cerebral infarction without residual deficits: Secondary | ICD-10-CM | POA: Diagnosis not present

## 2014-03-16 LAB — BASIC METABOLIC PANEL
ANION GAP: 14 (ref 5–15)
BUN: 12 mg/dL (ref 6–23)
CHLORIDE: 98 meq/L (ref 96–112)
CO2: 24 mEq/L (ref 19–32)
Calcium: 9.1 mg/dL (ref 8.4–10.5)
Creatinine, Ser: 0.51 mg/dL (ref 0.50–1.10)
GFR calc Af Amer: >90 mL/min (ref >90)
GFR, EST NON AFRICAN AMERICAN: 83 mL/min — AB (ref >90)
Glucose, Bld: 115 mg/dL — ABNORMAL HIGH (ref 70–99)
POTASSIUM: 4.1 meq/L (ref 3.7–5.3)
Sodium: 136 mEq/L — ABNORMAL LOW (ref 137–147)

## 2014-03-16 LAB — PRO B NATRIURETIC PEPTIDE: Pro B Natriuretic peptide (BNP): 356.4 pg/mL (ref 0–450)

## 2014-03-16 LAB — CBC
HCT: 42.9 % (ref 36.0–46.0)
HEMOGLOBIN: 14.3 g/dL (ref 12.0–15.0)
MCH: 28.3 pg (ref 26.0–34.0)
MCHC: 33.3 g/dL (ref 30.0–36.0)
MCV: 84.8 fL (ref 78.0–100.0)
PLATELETS: 253 10*3/uL (ref 150–400)
RBC: 5.06 MIL/uL (ref 3.87–5.11)
RDW: 14.1 % (ref 11.5–15.5)
WBC: 11.3 10*3/uL — ABNORMAL HIGH (ref 4.0–10.5)

## 2014-03-16 LAB — I-STAT TROPONIN, ED: TROPONIN I, POC: 0.01 ng/mL (ref 0.00–0.08)

## 2014-03-16 LAB — D-DIMER, QUANTITATIVE: D-Dimer, Quant: 0.36 ug/mL-FEU (ref 0.00–0.48)

## 2014-03-16 MED ORDER — NITROGLYCERIN 0.4 MG SL SUBL
0.4000 mg | SUBLINGUAL_TABLET | SUBLINGUAL | Status: DC | PRN
  Administered 2014-03-16: 0.4 mg via SUBLINGUAL
  Filled 2014-03-16: qty 1

## 2014-03-16 MED ORDER — HYDRALAZINE HCL 20 MG/ML IJ SOLN
10.0000 mg | Freq: Once | INTRAMUSCULAR | Status: AC
  Administered 2014-03-16: 10 mg via INTRAVENOUS
  Filled 2014-03-16: qty 1

## 2014-03-16 MED ORDER — MORPHINE SULFATE 2 MG/ML IJ SOLN
0.5000 mg | INTRAMUSCULAR | Status: DC | PRN
Start: 2014-03-16 — End: 2014-03-17
  Administered 2014-03-17: 0.5 mg via INTRAVENOUS
  Filled 2014-03-16: qty 1

## 2014-03-16 MED ORDER — ACETAMINOPHEN 500 MG PO TABS
1000.0000 mg | ORAL_TABLET | Freq: Once | ORAL | Status: AC
  Administered 2014-03-16: 1000 mg via ORAL
  Filled 2014-03-16: qty 2

## 2014-03-16 MED ORDER — MORPHINE SULFATE 2 MG/ML IJ SOLN: 2.0000 mg | INTRAMUSCULAR | Status: DC | PRN

## 2014-03-16 NOTE — ED Notes (Signed)
Per EMS, pt comes from home with c/o shortness of breath. Pt denies chest pain. Pt A&OX4, NAD noted. Pt has h/o hypertension. BP 218/81, P56, O2 96% rm air. 20g IV placed in left forearm.

## 2014-03-16 NOTE — ED Notes (Signed)
MD at bedside. 

## 2014-03-16 NOTE — ED Provider Notes (Signed)
CSN: [id number]     Arrival date & time 03/16/14  1942 History   First MD Initiated Contact with Patient 03/16/14 1943     Chief Complaint  Patient presents with  . Shortness of Breath     (Consider location/radiation/quality/duration/timing/severity/associated sxs/prior Treatment) HPI  80 year old female presents with shortness of breath since last night. Started all of a sudden his been gradually worsening. One week ago had glaucoma surgery on left eye. Patient was awake during the procedure with local anesthesia. Patient is not had any chest pain or chest pressure. She took her blood pressure medicine leg normal today but his been hypertensive upon arrival. Has never felt this way before. Does get shortness of breath is worse with lying flat. Has not had any lower extremity swelling.  Past Medical History  Diagnosis Date  . ANEMIA 10/21/2008  . AORTIC INSUFFICIENCY 11/26/2009  . BACK PAIN 06/27/2007  . COLON CANCER, PERSONAL HX 02/25/2010  . Diverticulosis of colon (without mention of hemorrhage) 08/14/2006  . GALLSTONES 10/09/2008  . GLAUCOMA NOS 08/14/2006  . HYPERTENSION 08/14/2006  . HYPOKALEMIA, HX OF 11/26/2009  . MELENA, HX OF 10/12/2008  . NEOPLASM, MALIGNANT, COLON, CECUM 10/30/2008  . OCCLUSION, CENTRAL RETINAL ARTERY 08/14/2006  . PERSONAL HX COLONIC POLYPS 10/21/2008  . SYNCOPE, HX OF 08/14/2006  . URI 09/07/2008  . H/O: stroke     behind right eye   Past Surgical History  Procedure Laterality Date  . Appendectomy    . Cataract extraction      bilateral  . Dilation and curettage of uterus    . Tonsillectomy    . Colon surgery      11/20/2008,gallbladder removed  . Ankle fracture surgery  2011    right   Family History  Problem Relation Age of Onset  . Stroke Neg Hx   . Heart disease Neg Hx   . Breast cancer Sister   . Heart attack Father   . Lung cancer Brother    History  Substance Use Topics  . Smoking status: Never Smoker   . Smokeless tobacco: Never Used  .  Alcohol Use: No   OB History    No data available     Review of Systems  Constitutional: Negative for fever.  Eyes: Positive for pain.  Respiratory: Positive for shortness of breath. Negative for cough.   Cardiovascular: Negative for chest pain and leg swelling.  Gastrointestinal: Negative for vomiting and abdominal pain.  All other systems reviewed and are negative.     Allergies  Sulfa antibiotics; Amoxicillin; Ciprofloxacin; Methocarbamol; and Trimpex  Home Medications   Prior to Admission medications   Medication Sig Start Date End Date Taking? Authorizing Provider  aspirin 81 MG tablet Take 1 tablet (81 mg total) by mouth daily. 12/05/10  Yes Marletta Lor, MD  atenolol (TENORMIN) 50 MG tablet TAKE ONE TABLET BY MOUTH ONCE DAILY   Yes Marletta Lor, MD  atropine 1 % ophthalmic solution Place 1 drop into both eyes 2 (two) times daily.   Yes Historical Provider, MD  brimonidine (ALPHAGAN P) 0.1 % SOLN Apply 1 drop to eye every 8 (eight) hours.   Yes Historical Provider, MD  Calcium Carb-Cholecalciferol (CALCIUM 600/VITAMIN D3) 600-800 MG-UNIT TABS Take 1 tablet by mouth daily.   Yes Historical Provider, MD  Difluprednate (DUREZOL) 0.05 % EMUL Place 1 drop into the left eye 4 (four) times daily.   Yes Historical Provider, MD  dorzolamide-timolol (COSOPT) 22.3-6.8 MG/ML ophthalmic solution Place 1  drop into the right eye 3 (three) times daily.   Yes Historical Provider, MD  tobramycin (TOBREX) 0.3 % ophthalmic solution Place 1 drop into the left eye 4 (four) times daily. 03/11/14  Yes Historical Provider, MD  vitamin C (ASCORBIC ACID) 500 MG tablet Take 500 mg by mouth daily.   Yes Historical Provider, MD  furosemide (LASIX) 20 MG tablet TAKE ONE TABLET BY MOUTH ONCE DAILY AS NEEDED FOR SWELLING    Marletta Lor, MD   BP 199/87 mmHg  Pulse 57  Temp(Src) 97.9 F (36.6 C) (Oral)  Resp 17  Ht 5\' 5"  (1.651 m)  Wt 168 lb 9.6 oz (76.476 kg)  BMI 28.06 kg/m2   SpO2 96% Physical Exam  Constitutional: She is oriented to person, place, and time. She appears well-developed and well-nourished.  HENT:  Head: Normocephalic and atraumatic.  Right Ear: External ear normal.  Left Ear: External ear normal.  Nose: Nose normal.  Eyes: Right eye exhibits no discharge. Left eye exhibits no discharge.  Cardiovascular: Normal rate, regular rhythm and normal heart sounds.   Pulmonary/Chest: Effort normal and breath sounds normal. She has no wheezes. She has no rales.  Abdominal: Soft. There is no tenderness.  Musculoskeletal: She exhibits no edema (trace bilateral pedal edema) or tenderness.  Neurological: She is alert and oriented to person, place, and time.  Skin: Skin is warm and dry. She is not diaphoretic.  Nursing note and vitals reviewed.   ED Course  Procedures (including critical care time) Labs Review Labs Reviewed  BASIC METABOLIC PANEL - Abnormal; Notable for the following:    Sodium 136 (*)    Glucose, Bld 115 (*)    GFR calc non Af Amer 83 (*)    All other components within normal limits  CBC - Abnormal; Notable for the following:    WBC 11.3 (*)    All other components within normal limits  PRO B NATRIURETIC PEPTIDE  D-DIMER, QUANTITATIVE  I-STAT TROPOININ, ED    Imaging Review Dg Chest 2 View (if Patient Has Fever And/or Copd)  03/16/2014   CLINICAL DATA:  Shortness of breath. Previous diagnosis of colon cancer.  EXAM: CHEST  2 VIEW  COMPARISON:  06/24/2012 chest CT  FINDINGS: The heart size is at upper limits of normal. Right AC joint deformity reidentified. Right apical coarse calcification is stable. Both lungs are otherwise clear. The visualized skeletal structures are unremarkable.  IMPRESSION: No acute cardiopulmonary process.   Electronically Signed   By: Conchita Paris M.D.   On: 03/16/2014 21:55     EKG Interpretation   Date/Time:  Monday March 16 2014 19:54:14 EST Ventricular Rate:  61 PR Interval:  191 QRS  Duration: 98 QT Interval:  453 QTC Calculation: 456 R Axis:   16 Text Interpretation:  Normal sinus rhythm no significant change since 2011  Confirmed by [place]  MD, Mahmud Keithly ([id number]) on 03/16/2014 8:07:23 PM      MDM   Final diagnoses:  Essential hypertension  Dyspnea    Patient is in no acute respiratory distress. Quite hypertensive on arrival, 220/75. This came down with nitroglycerin sublingual. No chest pain. Concern that this may be a cardiac equivalent given the hypertension. No evidence of acute CHF and is low risk for PE with a negative d-dimer. X-ray benign. D/w Dr. Hal Hope, will admit to obs with IV anti-hypertensives. Will give hydralazine now given bradycardia with hypertension.    Ephraim Hamburger, MD 03/16/14 [phone]

## 2014-03-16 NOTE — ED Notes (Signed)
Attempted to call report

## 2014-03-16 NOTE — ED Notes (Signed)
Eye patch given to pt at her request.

## 2014-03-16 NOTE — ED Notes (Signed)
Pt had recent glaucoma surgery on left eye, last Tuesday per daughter.

## 2014-03-16 NOTE — ED Notes (Signed)
Dr. Goldston at bedside.  

## 2014-03-16 NOTE — ED Notes (Signed)
Dr. Regenia Skeeter aware of pt's BP.

## 2014-03-16 NOTE — ED Notes (Signed)
Gave patient mouth swab and water.

## 2014-03-17 ENCOUNTER — Encounter (HOSPITAL_COMMUNITY): Payer: Self-pay | Admitting: Internal Medicine

## 2014-03-17 DIAGNOSIS — Z85038 Personal history of other malignant neoplasm of large intestine: Secondary | ICD-10-CM | POA: Diagnosis not present

## 2014-03-17 DIAGNOSIS — Z881 Allergy status to other antibiotic agents status: Secondary | ICD-10-CM | POA: Diagnosis not present

## 2014-03-17 DIAGNOSIS — Z9049 Acquired absence of other specified parts of digestive tract: Secondary | ICD-10-CM | POA: Diagnosis present

## 2014-03-17 DIAGNOSIS — Z882 Allergy status to sulfonamides status: Secondary | ICD-10-CM | POA: Diagnosis not present

## 2014-03-17 DIAGNOSIS — Z9842 Cataract extraction status, left eye: Secondary | ICD-10-CM | POA: Diagnosis not present

## 2014-03-17 DIAGNOSIS — Z9841 Cataract extraction status, right eye: Secondary | ICD-10-CM | POA: Diagnosis not present

## 2014-03-17 DIAGNOSIS — I10 Essential (primary) hypertension: Secondary | ICD-10-CM | POA: Insufficient documentation

## 2014-03-17 DIAGNOSIS — R0602 Shortness of breath: Secondary | ICD-10-CM | POA: Diagnosis not present

## 2014-03-17 DIAGNOSIS — I351 Nonrheumatic aortic (valve) insufficiency: Secondary | ICD-10-CM | POA: Diagnosis present

## 2014-03-17 DIAGNOSIS — Z7982 Long term (current) use of aspirin: Secondary | ICD-10-CM | POA: Diagnosis not present

## 2014-03-17 DIAGNOSIS — Z86010 Personal history of colonic polyps: Secondary | ICD-10-CM | POA: Diagnosis not present

## 2014-03-17 DIAGNOSIS — H409 Unspecified glaucoma: Secondary | ICD-10-CM | POA: Diagnosis present

## 2014-03-17 DIAGNOSIS — Z8673 Personal history of transient ischemic attack (TIA), and cerebral infarction without residual deficits: Secondary | ICD-10-CM | POA: Diagnosis not present

## 2014-03-17 DIAGNOSIS — R0600 Dyspnea, unspecified: Secondary | ICD-10-CM | POA: Diagnosis present

## 2014-03-17 LAB — COMPREHENSIVE METABOLIC PANEL
ALK PHOS: 78 U/L (ref 39–117)
ALT: 25 U/L (ref 0–35)
AST: 37 U/L (ref 0–37)
Albumin: 3.4 g/dL — ABNORMAL LOW (ref 3.5–5.2)
Anion gap: 10 (ref 5–15)
BILIRUBIN TOTAL: 0.6 mg/dL (ref 0.3–1.2)
BUN: 13 mg/dL (ref 6–23)
CHLORIDE: 101 meq/L (ref 96–112)
CO2: 21 mmol/L (ref 19–32)
Calcium: 8.8 mg/dL (ref 8.4–10.5)
Creatinine, Ser: 0.57 mg/dL (ref 0.50–1.10)
GFR, EST NON AFRICAN AMERICAN: 80 mL/min — AB (ref >90)
GLUCOSE: 135 mg/dL — AB (ref 70–99)
POTASSIUM: 3.2 mmol/L — AB (ref 3.5–5.1)
SODIUM: 132 mmol/L — AB (ref 135–145)
Total Protein: 7 g/dL (ref 6.0–8.3)

## 2014-03-17 LAB — CBC WITH DIFFERENTIAL/PLATELET
BASOS PCT: 0 % (ref 0–1)
Basophils Absolute: 0 10*3/uL (ref 0.0–0.1)
EOS ABS: 0 10*3/uL (ref 0.0–0.7)
Eosinophils Relative: 0 % (ref 0–5)
HCT: 41.8 % (ref 36.0–46.0)
Hemoglobin: 14.1 g/dL (ref 12.0–15.0)
Lymphocytes Relative: 14 % (ref 12–46)
Lymphs Abs: 1.7 10*3/uL (ref 0.7–4.0)
MCH: 28.4 pg (ref 26.0–34.0)
MCHC: 33.7 g/dL (ref 30.0–36.0)
MCV: 84.3 fL (ref 78.0–100.0)
MONOS PCT: 8 % (ref 3–12)
Monocytes Absolute: 1 10*3/uL (ref 0.1–1.0)
NEUTROS PCT: 78 % — AB (ref 43–77)
Neutro Abs: 9.6 10*3/uL — ABNORMAL HIGH (ref 1.7–7.7)
Platelets: 253 10*3/uL (ref 150–400)
RBC: 4.96 MIL/uL (ref 3.87–5.11)
RDW: 14.3 % (ref 11.5–15.5)
WBC: 12.3 10*3/uL — ABNORMAL HIGH (ref 4.0–10.5)

## 2014-03-17 LAB — TROPONIN I: Troponin I: <0.03 ng/mL (ref <0.031)

## 2014-03-17 MED ORDER — ATENOLOL 25 MG PO TABS: 50.0000 mg | ORAL_TABLET | Freq: Every day | ORAL | Status: DC

## 2014-03-17 MED ORDER — SODIUM CHLORIDE 0.9 % IV SOLN: INTRAVENOUS | Status: DC

## 2014-03-17 MED ORDER — ATROPINE SULFATE 1 % OP SOLN
1.0000 [drp] | Freq: Two times a day (BID) | OPHTHALMIC | Status: DC
  Administered 2014-03-17: 1 [drp] via OPHTHALMIC
  Filled 2014-03-17: qty 5

## 2014-03-17 MED ORDER — POTASSIUM CHLORIDE CRYS ER 20 MEQ PO TBCR
40.0000 meq | EXTENDED_RELEASE_TABLET | Freq: Once | ORAL | Status: AC
  Administered 2014-03-17: 40 meq via ORAL
  Filled 2014-03-17: qty 2

## 2014-03-17 MED ORDER — HYDRALAZINE HCL 20 MG/ML IJ SOLN
10.0000 mg | INTRAMUSCULAR | Status: DC | PRN
  Administered 2014-03-17: 10 mg via INTRAVENOUS
  Filled 2014-03-17: qty 1

## 2014-03-17 MED ORDER — DIFLUPREDNATE 0.05 % OP EMUL: 1.0000 [drp] | Freq: Four times a day (QID) | OPHTHALMIC | Status: DC

## 2014-03-17 MED ORDER — TOBRAMYCIN 0.3 % OP SOLN
1.0000 [drp] | Freq: Four times a day (QID) | OPHTHALMIC | Status: DC
Start: 2014-03-17 — End: 2014-03-17
  Administered 2014-03-17: 1 [drp] via OPHTHALMIC
  Filled 2014-03-17: qty 5

## 2014-03-17 MED ORDER — BRIMONIDINE TARTRATE 0.2 % OP SOLN
1.0000 [drp] | Freq: Three times a day (TID) | OPHTHALMIC | Status: DC
  Administered 2014-03-17: 1 [drp] via OPHTHALMIC
  Filled 2014-03-17: qty 5

## 2014-03-17 MED ORDER — ONDANSETRON HCL 4 MG/2ML IJ SOLN
4.0000 mg | Freq: Four times a day (QID) | INTRAMUSCULAR | Status: DC | PRN
  Administered 2014-03-17: 4 mg via INTRAVENOUS
  Filled 2014-03-17: qty 2

## 2014-03-17 MED ORDER — AMLODIPINE BESYLATE 10 MG PO TABS: 10.0000 mg | ORAL_TABLET | Freq: Every day | ORAL | Status: DC

## 2014-03-17 MED ORDER — PREDNISOLONE ACETATE 1 % OP SUSP
2.0000 [drp] | Freq: Four times a day (QID) | OPHTHALMIC | Status: DC
  Administered 2014-03-17: 2 [drp] via OPHTHALMIC
  Filled 2014-03-17: qty 1

## 2014-03-17 MED ORDER — DORZOLAMIDE HCL-TIMOLOL MAL 2-0.5 % OP SOLN
1.0000 [drp] | Freq: Three times a day (TID) | OPHTHALMIC | Status: DC
  Administered 2014-03-17: 1 [drp] via OPHTHALMIC
  Filled 2014-03-17: qty 10

## 2014-03-17 MED ORDER — ONDANSETRON HCL 4 MG PO TABS: 4.0000 mg | ORAL_TABLET | Freq: Four times a day (QID) | ORAL | Status: DC | PRN

## 2014-03-17 MED ORDER — ACETAMINOPHEN 325 MG PO TABS: 650.0000 mg | ORAL_TABLET | Freq: Four times a day (QID) | ORAL | Status: DC | PRN

## 2014-03-17 MED ORDER — ATENOLOL 25 MG PO TABS
100.0000 mg | ORAL_TABLET | Freq: Every day | ORAL | Status: DC
  Administered 2014-03-17: 100 mg via ORAL
  Filled 2014-03-17 (×2): qty 4

## 2014-03-17 MED ORDER — AMLODIPINE BESYLATE 10 MG PO TABS
10.0000 mg | ORAL_TABLET | Freq: Every day | ORAL | Status: DC
  Administered 2014-03-17: 10 mg via ORAL
  Filled 2014-03-17: qty 1

## 2014-03-17 MED ORDER — ACETAMINOPHEN 650 MG RE SUPP: 650.0000 mg | Freq: Four times a day (QID) | RECTAL | Status: DC | PRN

## 2014-03-17 NOTE — Progress Notes (Signed)
Pt resting in bed and stated she "felt funny". Pt stated she felt numb through out her face but denied feeling weaker on one side. BP 165/71. Pt's face symmetrical with equal movement and strength in all of her extremities. Md on call made aware. Will cont to monitor pt.

## 2014-03-17 NOTE — Discharge Instructions (Signed)
Follow with Primary MD Nyoka Cowden, MD in 5 days   Get CBC, CMP, 2 view Chest X ray checked  by Primary MD next visit.  Please follow with your PCP regarding uncontrolled blood pressure,  - Please keep your appointment with ophthalmology at 1 PM today.  Activity: As tolerated with Full fall precautions use walker/cane & assistance as needed   Disposition Home    Diet: Heart Healthy / , with feeding assistance and aspiration precautions as needed.  For Heart failure patients - Check your Weight same time everyday, if you gain over 2 pounds, or you develop in leg swelling, experience more shortness of breath or chest pain, call your Primary MD immediately. Follow Cardiac Low Salt Diet and 1.8 lit/day fluid restriction.   On your next visit with your primary care physician please Get Medicines reviewed and adjusted.   Please request your Prim.MD to go over all Hospital Tests and Procedure/Radiological results at the follow up, please get all Hospital records sent to your Prim MD by signing hospital release before you go home.   If you experience worsening of your admission symptoms, develop shortness of breath, life threatening emergency, suicidal or homicidal thoughts you must seek medical attention immediately by calling 911 or calling your MD immediately  if symptoms less severe.  You Must read complete instructions/literature along with all the possible adverse reactions/side effects for all the Medicines you take and that have been prescribed to you. Take any new Medicines after you have completely understood and accpet all the possible adverse reactions/side effects.   Do not drive, operating heavy machinery, perform activities at heights, swimming or participation in water activities or provide baby sitting services if your were admitted for syncope or siezures until you have seen by Primary MD or a Neurologist and advised to do so again.  Do not drive when taking Pain  medications.    Do not take more than prescribed Pain, Sleep and Anxiety Medications  Special Instructions: If you have smoked or chewed Tobacco  in the last 2 yrs please stop smoking, stop any regular Alcohol  and or any Recreational drug use.  Wear Seat belts while driving.   Please note  You were cared for by a hospitalist during your hospital stay. If you have any questions about your discharge medications or the care you received while you were in the hospital after you are discharged, you can call the unit and asked to speak with the hospitalist on call if the hospitalist that took care of you is not available. Once you are discharged, your primary care physician will handle any further medical issues. Please note that NO REFILLS for any discharge medications will be authorized once you are discharged, as it is imperative that you return to your primary care physician (or establish a relationship with a primary care physician if you do not have one) for your aftercare needs so that they can reassess your need for medications and monitor your lab values.

## 2014-03-17 NOTE — Discharge Summary (Signed)
Karen [name], 80 y.o., DOB 07-27-1922, MRN [id number]. Admission date: 03/16/2014 Discharge Date 03/17/2014 Primary MD Karen Cowden, MD Admitting Physician Karen Patience, MD  Admission Diagnosis  Dyspnea [R06.00] Essential hypertension [I10]  Discharge Diagnosis   Principal Problem:   SOB (shortness of breath) Active Problems:   History of malignant neoplasm of large intestine   Accelerated hypertension    PCP please follow: -Hypertension, patient was started on Norvasc during this hospitalization.  Past Medical History  Diagnosis Date  . ANEMIA 10/21/2008  . AORTIC INSUFFICIENCY 11/26/2009  . BACK PAIN 06/27/2007  . COLON CANCER, PERSONAL HX 02/25/2010  . Diverticulosis of colon (without mention of hemorrhage) 08/14/2006  . GALLSTONES 10/09/2008  . GLAUCOMA NOS 08/14/2006  . HYPERTENSION 08/14/2006  . HYPOKALEMIA, HX OF 11/26/2009  . MELENA, HX OF 10/12/2008  . NEOPLASM, MALIGNANT, COLON, CECUM 10/30/2008  . OCCLUSION, CENTRAL RETINAL ARTERY 08/14/2006  . PERSONAL HX COLONIC POLYPS 10/21/2008  . SYNCOPE, HX OF 08/14/2006  . URI 09/07/2008  . H/O: stroke     behind right eye    Past Surgical History  Procedure Laterality Date  . Appendectomy    . Cataract extraction      bilateral  . Dilation and curettage of uterus    . Tonsillectomy    . Colon surgery      11/20/2008,gallbladder removed  . Ankle fracture surgery  2011    right    admission history of present illness/brief narrative: Karen [name] is a 80 y.o. female with history of hypertension, colon cancer in remission was brought to the ER the patient was complaining of shortness of breath. Patient's blood pressure is also followed be elevated. Patient has had a recent surgery for her left eye for glaucoma. She is supposed to follow with her ophthalmologist for recheck later today. In the ER patient's initial blood pressure was  more than 200. Cardiac markers BNP and d-dimer as well as negative and chest x-ray  was unremarkable. Patient was given hydralazine 10 mg IV after administering sublingual. Following which patient blood pressure improved. Patient was admitted for further management of her shortness of breath and uncontrolled blood pressure. Patient denies any nausea vomiting abdominal pain diarrhea chest pain or any productive cough.  Patient was started on Norvasc this morning, the pressures were acceptable at this point, most recent is 145/56 , she denies any chest pain, shortness of breath, dizziness or right headedness on discharge, patient ambulated through the hallway on room air which she didn't tolerate very well.   Hospital Course See H&P, Labs, Consult and Test reports for all details in brief, patient was admitted for **  Principal Problem:   SOB (shortness of breath) Active Problems:   History of malignant neoplasm of large intestine   Accelerated hypertension  Shortness of breath: -This is most likely related to accelerated hypertension, patient saturating 97% on room air, no tachypnea, chest x-ray showed no acute pulmonary process, negative d-dimer, normal BNP.  Hypertension -Accelerated, currently much improved after adding Norvasc, patient will be discharged on her home medication atenolol, will add Norvasc on discharge, daughter was requested to follow with PCP within the next few days for follow-up.  Left eye glaucoma surgery -Patient will be discharged on her home medication, she was asked to keep up with her follow-up appointment today at 1 PM.    Significant Tests:  See full reports for all details    Dg Chest 2 View (if Patient Has Fever And/or Copd)  03/16/2014   CLINICAL DATA:  Shortness of breath. Previous diagnosis of colon cancer.  EXAM: CHEST  2 VIEW  COMPARISON:  06/24/2012 chest CT  FINDINGS: The heart size is at upper limits of normal. Right AC joint deformity reidentified. Right apical coarse calcification is stable. Both lungs are otherwise clear. The  visualized skeletal structures are unremarkable.  IMPRESSION: No acute cardiopulmonary process.   Electronically Signed   By: Karen [name] M.D.   On: 03/16/2014 21:55     Today   Subjective:   Karen [name] today has no headache,no chest abdominal pain,no new weakness tingling or numbness, feels much better .  Objective:   Blood pressure 145/56, pulse 61, temperature 97.9 F (36.6 C), temperature source Oral, resp. rate 18, height 5\' 5"  (1.651 m), weight 76.6 kg (168 lb 14 oz), SpO2 97 %.  Intake/Output Summary (Last 24 hours) at 03/17/14 1152 Last data filed at 03/17/14 0059  Gross per 24 hour  Intake    240 ml  Output      0 ml  Net    240 ml    Exam Awake Alert, Oriented *3, No new F.N deficits, Normal affect [place].AT, left eye erythema. Supple Neck,No JVD, No cervical lymphadenopathy appriciated.  Symmetrical Chest wall movement, Good air movement bilaterally, CTAB RRR,No Gallops,Rubs or new Murmurs, No Parasternal Heave +ve B.Sounds, Abd Soft, Non tender, No organomegaly appriciated, No rebound -guarding or rigidity. No Cyanosis, Clubbing or edema, No new Rash or bruise  Data Review     CBC w Diff: Lab Results  Component Value Date   WBC 12.3* 03/17/2014   WBC 7.2 06/27/2013   HGB 14.1 03/17/2014   HGB 12.9 06/27/2013   HCT 41.8 03/17/2014   HCT 39.2 06/27/2013   PLT 253 03/17/2014   PLT 219 06/27/2013   LYMPHOPCT 14 03/17/2014   LYMPHOPCT 25.0 06/27/2013   MONOPCT 8 03/17/2014   MONOPCT 10.3 06/27/2013   EOSPCT 0 03/17/2014   EOSPCT 2.0 06/27/2013   BASOPCT 0 03/17/2014   BASOPCT 0.5 06/27/2013   CMP: Lab Results  Component Value Date   NA 132* 03/17/2014   NA 141 06/27/2013   NA 145 12/13/2010   K 3.2* 03/17/2014   K 4.2 06/27/2013   K 4.0 12/13/2010   CL 101 03/17/2014   CL 103 06/24/2012   CL 102 12/13/2010   CO2 21 03/17/2014   CO2 25 06/27/2013   CO2 27 12/13/2010   BUN 13 03/17/2014   BUN 14.4 06/27/2013   BUN 15 12/13/2010    CREATININE 0.57 03/17/2014   CREATININE 0.8 06/27/2013   CREATININE 0.8 12/13/2010   PROT 7.0 03/17/2014   PROT 7.5 06/27/2013   PROT 7.3 12/13/2010   ALBUMIN 3.4* 03/17/2014   ALBUMIN 3.4* 06/27/2013   BILITOT 0.6 03/17/2014   BILITOT 0.34 06/27/2013   BILITOT 0.50 12/13/2010   ALKPHOS 78 03/17/2014   ALKPHOS 71 06/27/2013   ALKPHOS 66 12/13/2010   AST 37 03/17/2014   AST 23 06/27/2013   AST 22 12/13/2010   ALT 25 03/17/2014   ALT 9 06/27/2013   ALT 16 12/13/2010  .  Micro Results No results found for this or any previous visit (from the past 240 hour(s)).   Discharge Instructions      Follow-up Information    Follow up with Karen Cowden, MD In 4 days.   Specialty:  Internal Medicine   Contact information:   [place] [address] [phone]  Discharge Medications     Medication List    TAKE these medications        amLODipine 10 MG tablet  Commonly known as:  NORVASC  Take 1 tablet (10 mg total) by mouth daily.     aspirin 81 MG tablet  Take 1 tablet (81 mg total) by mouth daily.     atenolol 50 MG tablet  Commonly known as:  TENORMIN  TAKE ONE TABLET BY MOUTH ONCE DAILY     atropine 1 % ophthalmic solution  Place 1 drop into the left eye 2 (two) times daily.     brimonidine 0.1 % Soln  Commonly known as:  ALPHAGAN P  Apply 1 drop to eye every 8 (eight) hours. Only right eye.     CALCIUM 600/VITAMIN D3 600-800 MG-UNIT Tabs  Generic drug:  Calcium Carb-Cholecalciferol  Take 1 tablet by mouth daily.     dorzolamide-timolol 22.3-6.8 MG/ML ophthalmic solution  Commonly known as:  COSOPT  Place 1 drop into the right eye 3 (three) times daily.     DUREZOL 0.05 % Emul  Generic drug:  Difluprednate  Place 1 drop into the left eye 4 (four) times daily.     furosemide 20 MG tablet  Commonly known as:  LASIX  TAKE ONE TABLET BY MOUTH ONCE DAILY AS NEEDED FOR SWELLING     tobramycin 0.3 % ophthalmic solution   Commonly known as:  TOBREX  Place 1 drop into the left eye 4 (four) times daily.     vitamin C 500 MG tablet  Commonly known as:  ASCORBIC ACID  Take 500 mg by mouth daily.         Total Time in preparing paper work, data evaluation and todays exam - 35 minutes  Sheddrick Lattanzio M.D on 03/17/2014 at 11:52 AM  [facility] Office  [phone]

## 2014-03-17 NOTE — Progress Notes (Signed)
PT Cancellation Note  Patient Details Name: Karen [name] MRN: [id number] DOB: [birth year]   Cancelled Treatment:    Reason Eval/Treat Not Completed: Other (comment) (order received but pt already D/C and RN stated no need for eval)   Lanetta Inch Beth 03/17/2014, 1:17 PM Elwyn Reach, [place]

## 2014-03-17 NOTE — ED Notes (Signed)
Transporting patient to new room assignment. 

## 2014-03-17 NOTE — H&P (Addendum)
[facility] History and Physical  [name]:[id number] DOB: 09/09/1922 DOA: 03/16/2014  Referring physician: ER physician. PCP: Nyoka Cowden, MD   Chief Complaint: Shortness of breath.  HPI: Karen [name] is a 80 y.o. female with history of hypertension, colon cancer in remission was brought to the ER the patient was complaining of shortness of breath. Patient's blood pressure is also followed be elevated. Patient has had a recent surgery for her left eye for glaucoma. She is supposed to follow with her ophthalmologist for recheck later today. In the ER patient's initial blood pressure was only more than 200. Cardiac markers BNP and d-dimer as well as negative and chest x-ray was unremarkable. Patient was given hydralazine 10 mg IV after administering sublingual. Following which patient blood pressure improved. Patient will be admitted for further management of her shortness of breath and uncontrolled blood pressure. Patient denies any nausea vomiting abdominal pain diarrhea chest pain or any productive cough.   Review of Systems: As presented in the history of presenting illness, rest negative.  Past Medical History  Diagnosis Date  . ANEMIA 10/21/2008  . AORTIC INSUFFICIENCY 11/26/2009  . BACK PAIN 06/27/2007  . COLON CANCER, PERSONAL HX 02/25/2010  . Diverticulosis of colon (without mention of hemorrhage) 08/14/2006  . GALLSTONES 10/09/2008  . GLAUCOMA NOS 08/14/2006  . HYPERTENSION 08/14/2006  . HYPOKALEMIA, HX OF 11/26/2009  . MELENA, HX OF 10/12/2008  . NEOPLASM, MALIGNANT, COLON, CECUM 10/30/2008  . OCCLUSION, CENTRAL RETINAL ARTERY 08/14/2006  . PERSONAL HX COLONIC POLYPS 10/21/2008  . SYNCOPE, HX OF 08/14/2006  . URI 09/07/2008  . H/O: stroke     behind right eye   Past Surgical History  Procedure Laterality Date  . Appendectomy    . Cataract extraction      bilateral  . Dilation and curettage of uterus    . Tonsillectomy    . Colon surgery     11/20/2008,gallbladder removed  . Ankle fracture surgery  2011    right   Social History:  reports that she has never smoked. She has never used smokeless tobacco. She reports that she does not drink alcohol or use illicit drugs. Where does patient live home. Can patient participate in ADLs? Yes.  Allergies  Allergen Reactions  . Sulfa Antibiotics Itching and Swelling  . Amoxicillin Itching and Rash  . Ciprofloxacin Itching and Rash  . Methocarbamol Itching  . Trimpex [Trimethoprim] Itching and Rash    Family History:  Family History  Problem Relation Age of Onset  . Stroke Neg Hx   . Heart disease Neg Hx   . Breast cancer Sister   . Heart attack Father   . Lung cancer Brother       Prior to Admission medications   Medication Sig Start Date End Date Taking? Authorizing Provider  aspirin 81 MG tablet Take 1 tablet (81 mg total) by mouth daily. 12/05/10  Yes Marletta Lor, MD  atenolol (TENORMIN) 50 MG tablet TAKE ONE TABLET BY MOUTH ONCE DAILY   Yes Marletta Lor, MD  atropine 1 % ophthalmic solution Place 1 drop into the left eye 2 (two) times daily.    Yes Historical Provider, MD  brimonidine (ALPHAGAN P) 0.1 % SOLN Apply 1 drop to eye every 8 (eight) hours. Only right eye.   Yes Historical Provider, MD  Calcium Carb-Cholecalciferol (CALCIUM 600/VITAMIN D3) 600-800 MG-UNIT TABS Take 1 tablet by mouth daily.   Yes Historical Provider, MD  Difluprednate (DUREZOL) 0.05 %  EMUL Place 1 drop into the left eye 4 (four) times daily.   Yes Historical Provider, MD  dorzolamide-timolol (COSOPT) 22.3-6.8 MG/ML ophthalmic solution Place 1 drop into the right eye 3 (three) times daily.   Yes Historical Provider, MD  tobramycin (TOBREX) 0.3 % ophthalmic solution Place 1 drop into the left eye 4 (four) times daily. 03/11/14  Yes Historical Provider, MD  vitamin C (ASCORBIC ACID) 500 MG tablet Take 500 mg by mouth daily.   Yes Historical Provider, MD  furosemide (LASIX) 20 MG tablet  TAKE ONE TABLET BY MOUTH ONCE DAILY AS NEEDED FOR SWELLING    Marletta Lor, MD    Physical Exam: Filed Vitals:   03/16/14 2315 03/16/14 2330 03/16/14 2345 03/17/14 0000  BP: 180/65 158/65 164/60 157/57  Pulse: 55 54 60 57  Temp:      TempSrc:      Resp: 18 17 16 17   Height:      Weight:      SpO2: 95% 96% 96% 96%     General:  Well-developed and nourished.  Eyes: Poor vision on the right eye from previous stroke and left eye looks congested. Patient is able to see on the left eye.  ENT: No discharge from the ears eyes nose more.  Neck: No neck rigidity.  Cardiovascular: S1 and S2 heard.  Respiratory: No rhonchi or crepitations.  Abdomen: Soft nontender bowel sounds present.  Skin: No rash.  Musculoskeletal: No edema.  Psychiatric: Appears normal.  Neurologic: Alert awake and oriented to time place and person. Moves all extremities.  Labs on Admission:  Basic Metabolic Panel:  Recent Labs Lab 03/16/14 2007  NA 136*  K 4.1  CL 98  CO2 24  GLUCOSE 115*  BUN 12  CREATININE 0.51  CALCIUM 9.1   Liver Function Tests: No results for input(s): AST, ALT, ALKPHOS, BILITOT, PROT, ALBUMIN in the last 168 hours. No results for input(s): LIPASE, AMYLASE in the last 168 hours. No results for input(s): AMMONIA in the last 168 hours. CBC:  Recent Labs Lab 03/16/14 2007  WBC 11.3*  HGB 14.3  HCT 42.9  MCV 84.8  PLT 253   Cardiac Enzymes: No results for input(s): CKTOTAL, CKMB, CKMBINDEX, TROPONINI in the last 168 hours.  BNP (last 3 results)  Recent Labs  03/16/14 2007  PROBNP 356.4   CBG: No results for input(s): GLUCAP in the last 168 hours.  Radiological Exams on Admission: Dg Chest 2 View (if Patient Has Fever And/or Copd)  03/16/2014   CLINICAL DATA:  Shortness of breath. Previous diagnosis of colon cancer.  EXAM: CHEST  2 VIEW  COMPARISON:  06/24/2012 chest CT  FINDINGS: The heart size is at upper limits of normal. Right AC joint  deformity reidentified. Right apical coarse calcification is stable. Both lungs are otherwise clear. The visualized skeletal structures are unremarkable.  IMPRESSION: No acute cardiopulmonary process.   Electronically Signed   By: Conchita Paris M.D.   On: 03/16/2014 21:55    EKG: Independently reviewed. Normal sinus rhythm.  Assessment/Plan Principal Problem:   SOB (shortness of breath) Active Problems:   History of malignant neoplasm of large intestine   Accelerated hypertension   1. Shortness of breath probably secondary to uncontrolled blood pressure - at this time troponins BNP and d-dimer as well as negative and chest x-ray is unremarkable. Patient's shortness of breath may be related to her accelerated hypertension which may be uncontrolled probably from recent stress. At this time I'm continuing  patient's home antihypertensives and also placing patient on when necessary IV hydralazine. Closely follow blood pressure trends. 2. Accelerated hypertension - see #1. 3. Recent left eye surgery - continue patient's eyedrops. Patient's daughter stated that patient's ophthalmologist will be notified about admission. 4. History of colon cancer in remission.  DVT prophylaxis - patient is placed on SCD due to recent surgery to left eye.  Code Status: Full code.  Family Communication: Patient's daughter at the bedside.  Disposition Plan: Admit to inpatient.    Enis Leatherwood N. [facility] Pager [phone].  If 7PM-7AM, please contact night-coverage [email or web address] Password [facility] 03/17/2014, 12:11 AM

## 2014-03-18 DIAGNOSIS — H31422 Serous choroidal detachment, left eye: Secondary | ICD-10-CM | POA: Diagnosis not present

## 2014-03-18 DIAGNOSIS — H31302 Unspecified choroidal hemorrhage, left eye: Secondary | ICD-10-CM | POA: Diagnosis not present

## 2014-03-19 ENCOUNTER — Encounter: Payer: Medicare Other | Admitting: Internal Medicine

## 2014-03-19 DIAGNOSIS — H31302 Unspecified choroidal hemorrhage, left eye: Secondary | ICD-10-CM | POA: Diagnosis not present

## 2014-03-19 DIAGNOSIS — H31322 Choroidal rupture, left eye: Secondary | ICD-10-CM | POA: Diagnosis not present

## 2014-03-23 DIAGNOSIS — H31412 Hemorrhagic choroidal detachment, left eye: Secondary | ICD-10-CM | POA: Diagnosis not present

## 2014-03-23 DIAGNOSIS — I10 Essential (primary) hypertension: Secondary | ICD-10-CM | POA: Diagnosis not present

## 2014-03-24 DIAGNOSIS — H31412 Hemorrhagic choroidal detachment, left eye: Secondary | ICD-10-CM | POA: Diagnosis not present

## 2014-03-24 DIAGNOSIS — Z8673 Personal history of transient ischemic attack (TIA), and cerebral infarction without residual deficits: Secondary | ICD-10-CM | POA: Diagnosis not present

## 2014-03-24 DIAGNOSIS — H409 Unspecified glaucoma: Secondary | ICD-10-CM | POA: Diagnosis not present

## 2014-03-24 DIAGNOSIS — I10 Essential (primary) hypertension: Secondary | ICD-10-CM | POA: Diagnosis not present

## 2014-03-25 ENCOUNTER — Other Ambulatory Visit: Payer: Self-pay | Admitting: Internal Medicine

## 2014-04-03 DIAGNOSIS — I10 Essential (primary) hypertension: Secondary | ICD-10-CM | POA: Diagnosis not present

## 2014-04-03 DIAGNOSIS — Z48810 Encounter for surgical aftercare following surgery on the sense organs: Secondary | ICD-10-CM | POA: Diagnosis not present

## 2014-04-03 DIAGNOSIS — Z9889 Other specified postprocedural states: Secondary | ICD-10-CM | POA: Diagnosis not present

## 2014-04-10 ENCOUNTER — Encounter: Payer: Self-pay | Admitting: Internal Medicine

## 2014-04-10 ENCOUNTER — Ambulatory Visit (INDEPENDENT_AMBULATORY_CARE_PROVIDER_SITE_OTHER): Payer: Medicare Other | Admitting: Internal Medicine

## 2014-04-10 VITALS — BP 120/80 | HR 74 | Temp 97.6°F | Resp 20 | Ht 65.0 in | Wt 163.0 lb

## 2014-04-10 DIAGNOSIS — I10 Essential (primary) hypertension: Secondary | ICD-10-CM

## 2014-04-10 DIAGNOSIS — R0602 Shortness of breath: Secondary | ICD-10-CM

## 2014-04-10 DIAGNOSIS — Z85038 Personal history of other malignant neoplasm of large intestine: Secondary | ICD-10-CM

## 2014-04-10 DIAGNOSIS — I359 Nonrheumatic aortic valve disorder, unspecified: Secondary | ICD-10-CM | POA: Diagnosis not present

## 2014-04-10 NOTE — Progress Notes (Signed)
Pre visit review using our clinic review tool, if applicable. No additional management support is needed unless otherwise documented below in the visit note. 

## 2014-04-10 NOTE — Progress Notes (Signed)
Subjective:    Patient ID: Karen [name], female    DOB: [birth year], 81 y.o.   MRN: [id number]  HPI 81 year old patient who has a history of essential hypertension.  She was seen in late December with shortness of breath and accelerated hypertension and had a brief hospital admission Hospital records reviewed Since her discharge she has remained weak.  She has been followed closely by ophthalmology and has had a number of procedures.  Today she is accompanied by her granddaughter Medical regimen reviewed.  Apparently she is back on atenolol only, but does not take amlodipine or furosemide.  She has been very inactive.  Past Medical History  Diagnosis Date  . ANEMIA 10/21/2008  . AORTIC INSUFFICIENCY 11/26/2009  . BACK PAIN 06/27/2007  . COLON CANCER, PERSONAL HX 02/25/2010  . Diverticulosis of colon (without mention of hemorrhage) 08/14/2006  . GALLSTONES 10/09/2008  . GLAUCOMA NOS 08/14/2006  . HYPERTENSION 08/14/2006  . HYPOKALEMIA, HX OF 11/26/2009  . MELENA, HX OF 10/12/2008  . NEOPLASM, MALIGNANT, COLON, CECUM 10/30/2008  . OCCLUSION, CENTRAL RETINAL ARTERY 08/14/2006  . PERSONAL HX COLONIC POLYPS 10/21/2008  . SYNCOPE, HX OF 08/14/2006  . URI 09/07/2008  . H/O: stroke     behind right eye    History   Social History  . Marital Status: Married    Spouse Name: N/A    Number of Children: 3  . Years of Education: N/A   Occupational History  . retired    Social History Main Topics  . Smoking status: Never Smoker   . Smokeless tobacco: Never Used  . Alcohol Use: No  . Drug Use: No  . Sexual Activity: Not on file   Other Topics Concern  . Not on file   Social History Narrative    Past Surgical History  Procedure Laterality Date  . Appendectomy    . Cataract extraction      bilateral  . Dilation and curettage of uterus    . Tonsillectomy    . Colon surgery      11/20/2008,gallbladder removed  . Ankle fracture surgery  2011    right    Family History  Problem Relation  Age of Onset  . Stroke Neg Hx   . Heart disease Neg Hx   . Breast cancer Sister   . Heart attack Father   . Lung cancer Brother     Allergies  Allergen Reactions  . Sulfa Antibiotics Itching and Swelling  . Amoxicillin Itching and Rash  . Ciprofloxacin Itching and Rash  . Methocarbamol Itching  . Trimpex [Trimethoprim] Itching and Rash    Current Outpatient Prescriptions on File Prior to Visit  Medication Sig Dispense Refill  . aspirin 81 MG tablet Take 1 tablet (81 mg total) by mouth daily. 90 tablet 6  . atenolol (TENORMIN) 50 MG tablet TAKE ONE TABLET BY MOUTH ONCE DAILY 90 tablet 0  . atropine 1 % ophthalmic solution Place 1 drop into the left eye 2 (two) times daily.     . brimonidine (ALPHAGAN P) 0.1 % SOLN Apply 1 drop to eye every 8 (eight) hours. Only right eye.    . Calcium Carb-Cholecalciferol (CALCIUM 600/VITAMIN D3) 600-800 MG-UNIT TABS Take 1 tablet by mouth daily.    . Difluprednate (DUREZOL) 0.05 % EMUL Place 1 drop into the left eye 4 (four) times daily.    . dorzolamide-timolol (COSOPT) 22.3-6.8 MG/ML ophthalmic solution Place 1 drop into the right eye 3 (three) times daily.    Marland Kitchen  furosemide (LASIX) 20 MG tablet TAKE ONE TABLET BY MOUTH ONCE DAILY AS NEEDED FOR SWELLING 30 tablet 2  . vitamin C (ASCORBIC ACID) 500 MG tablet Take 500 mg by mouth daily.     No current facility-administered medications on file prior to visit.    BP 120/80 mmHg  Pulse 74  Temp(Src) 97.6 F (36.4 C) (Oral)  Resp 20  Ht 5\' 5"  (1.651 m)  Wt 163 lb (73.936 kg)  BMI 27.12 kg/m2  SpO2 97%      Review of Systems  HENT: Negative for congestion, dental problem, hearing loss, rhinorrhea, sinus pressure, sore throat and tinnitus.   Eyes: Negative for pain, discharge and visual disturbance.  Respiratory: Negative for cough and shortness of breath.   Cardiovascular: Negative for chest pain, palpitations and leg swelling.  Gastrointestinal: Negative for nausea, vomiting, abdominal  pain, diarrhea, constipation, blood in stool and abdominal distention.  Genitourinary: Negative for dysuria, urgency, frequency, hematuria, flank pain, vaginal bleeding, vaginal discharge, difficulty urinating, vaginal pain and pelvic pain.  Musculoskeletal: Negative for joint swelling, arthralgias and gait problem.  Skin: Negative for rash.  Neurological: Positive for weakness. Negative for dizziness, syncope, speech difficulty, numbness and headaches.  Hematological: Negative for adenopathy.  Psychiatric/Behavioral: Negative for behavioral problems, dysphoric mood and agitation. The patient is not nervous/anxious.        Objective:   Physical Exam  Constitutional: She is oriented to person, place, and time. She appears well-developed and well-nourished.  Blood pressure well controlled at 120 over 80  HENT:  Head: Normocephalic.  Right Ear: External ear normal.  Left Ear: External ear normal.  Mouth/Throat: Oropharynx is clear and moist.  Eyes: Conjunctivae and EOM are normal. Pupils are equal, round, and reactive to light.  Left conjunctival injection Pupils are irregular and poorly reactive  Neck: Normal range of motion. Neck supple. No thyromegaly present.  Cardiovascular: Normal rate, regular rhythm, normal heart sounds and intact distal pulses.   Pulmonary/Chest: Effort normal and breath sounds normal.  Abdominal: Soft. Bowel sounds are normal. She exhibits no mass. There is no tenderness.  Musculoskeletal: Normal range of motion.  Lymphadenopathy:    She has no cervical adenopathy.  Neurological: She is alert and oriented to person, place, and time.  Skin: Skin is warm and dry. No rash noted.  Psychiatric: She has a normal mood and affect. Her behavior is normal.          Assessment & Plan:   Hypertension, well-controlled History of accelerated hypertension Dyspnea, resolved Weakness.  Probable deconditioning slow graded exercise program encouraged Glaucoma.   Follow-up ophthalmology  Recheck here 3 months

## 2014-04-10 NOTE — Patient Instructions (Signed)
Limit your sodium (Salt) intake  Please check your blood pressure on a regular basis.  If it is consistently greater than 150/90, please make an office appointment.  Return in 3 months for follow-up  

## 2014-04-29 DIAGNOSIS — Z48810 Encounter for surgical aftercare following surgery on the sense organs: Secondary | ICD-10-CM | POA: Diagnosis not present

## 2014-04-29 DIAGNOSIS — Z9889 Other specified postprocedural states: Secondary | ICD-10-CM | POA: Diagnosis not present

## 2014-04-29 DIAGNOSIS — I10 Essential (primary) hypertension: Secondary | ICD-10-CM | POA: Diagnosis not present

## 2014-05-25 ENCOUNTER — Encounter (HOSPITAL_COMMUNITY): Payer: Self-pay | Admitting: Emergency Medicine

## 2014-05-25 ENCOUNTER — Inpatient Hospital Stay (HOSPITAL_COMMUNITY)
Admission: EM | Admit: 2014-05-25 | Discharge: 2014-05-28 | DRG: 690 | Disposition: A | Discharge status: Home / Self Care | Payer: Medicare Other | Attending: Internal Medicine | Admitting: Internal Medicine

## 2014-05-25 ENCOUNTER — Emergency Department (HOSPITAL_COMMUNITY): Payer: Medicare Other

## 2014-05-25 DIAGNOSIS — H409 Unspecified glaucoma: Secondary | ICD-10-CM | POA: Diagnosis present

## 2014-05-25 DIAGNOSIS — Z888 Allergy status to other drugs, medicaments and biological substances status: Secondary | ICD-10-CM

## 2014-05-25 DIAGNOSIS — E876 Hypokalemia: Secondary | ICD-10-CM | POA: Diagnosis present

## 2014-05-25 DIAGNOSIS — N390 Urinary tract infection, site not specified: Secondary | ICD-10-CM | POA: Diagnosis not present

## 2014-05-25 DIAGNOSIS — Z9841 Cataract extraction status, right eye: Secondary | ICD-10-CM | POA: Diagnosis not present

## 2014-05-25 DIAGNOSIS — I10 Essential (primary) hypertension: Secondary | ICD-10-CM | POA: Diagnosis present

## 2014-05-25 DIAGNOSIS — Z8673 Personal history of transient ischemic attack (TIA), and cerebral infarction without residual deficits: Secondary | ICD-10-CM | POA: Diagnosis not present

## 2014-05-25 DIAGNOSIS — R1084 Generalized abdominal pain: Secondary | ICD-10-CM

## 2014-05-25 DIAGNOSIS — Z880 Allergy status to penicillin: Secondary | ICD-10-CM

## 2014-05-25 DIAGNOSIS — Z882 Allergy status to sulfonamides status: Secondary | ICD-10-CM

## 2014-05-25 DIAGNOSIS — Z9842 Cataract extraction status, left eye: Secondary | ICD-10-CM | POA: Diagnosis not present

## 2014-05-25 DIAGNOSIS — E860 Dehydration: Secondary | ICD-10-CM | POA: Diagnosis present

## 2014-05-25 DIAGNOSIS — B9689 Other specified bacterial agents as the cause of diseases classified elsewhere: Secondary | ICD-10-CM | POA: Diagnosis not present

## 2014-05-25 DIAGNOSIS — Z85038 Personal history of other malignant neoplasm of large intestine: Secondary | ICD-10-CM | POA: Diagnosis not present

## 2014-05-25 DIAGNOSIS — I700 Atherosclerosis of aorta: Secondary | ICD-10-CM | POA: Diagnosis not present

## 2014-05-25 DIAGNOSIS — K598 Other specified functional intestinal disorders: Secondary | ICD-10-CM | POA: Diagnosis not present

## 2014-05-25 DIAGNOSIS — Z7982 Long term (current) use of aspirin: Secondary | ICD-10-CM

## 2014-05-25 DIAGNOSIS — Z79899 Other long term (current) drug therapy: Secondary | ICD-10-CM

## 2014-05-25 DIAGNOSIS — N281 Cyst of kidney, acquired: Secondary | ICD-10-CM | POA: Diagnosis not present

## 2014-05-25 DIAGNOSIS — K7689 Other specified diseases of liver: Secondary | ICD-10-CM | POA: Diagnosis not present

## 2014-05-25 DIAGNOSIS — Z881 Allergy status to other antibiotic agents status: Secondary | ICD-10-CM

## 2014-05-25 LAB — CBC WITH DIFFERENTIAL/PLATELET
Basophils Absolute: 0 10*3/uL (ref 0.0–0.1)
Basophils Relative: 0 % (ref 0–1)
EOS PCT: 0 % (ref 0–5)
Eosinophils Absolute: 0 10*3/uL (ref 0.0–0.7)
HCT: 42.4 % (ref 36.0–46.0)
Hemoglobin: 14.6 g/dL (ref 12.0–15.0)
LYMPHS PCT: 10 % — AB (ref 12–46)
Lymphs Abs: 1.6 10*3/uL (ref 0.7–4.0)
MCH: 30 pg (ref 26.0–34.0)
MCHC: 34.4 g/dL (ref 30.0–36.0)
MCV: 87.1 fL (ref 78.0–100.0)
MONO ABS: 1.2 10*3/uL — AB (ref 0.1–1.0)
MONOS PCT: 7 % (ref 3–12)
Neutro Abs: 14 10*3/uL — ABNORMAL HIGH (ref 1.7–7.7)
Neutrophils Relative %: 83 % — ABNORMAL HIGH (ref 43–77)
Platelets: 299 10*3/uL (ref 150–400)
RBC: 4.87 MIL/uL (ref 3.87–5.11)
RDW: 14.3 % (ref 11.5–15.5)
WBC: 16.8 10*3/uL — ABNORMAL HIGH (ref 4.0–10.5)

## 2014-05-25 LAB — URINE MICROSCOPIC-ADD ON

## 2014-05-25 LAB — COMPREHENSIVE METABOLIC PANEL
ALBUMIN: 3.5 g/dL (ref 3.5–5.2)
ALK PHOS: 71 U/L (ref 39–117)
ALT: 21 U/L (ref 0–35)
AST: 40 U/L — ABNORMAL HIGH (ref 0–37)
Anion gap: 12 (ref 5–15)
BUN: 11 mg/dL (ref 6–23)
CO2: 25 mmol/L (ref 19–32)
Calcium: 9.4 mg/dL (ref 8.4–10.5)
Chloride: 99 mmol/L (ref 96–112)
Creatinine, Ser: 0.85 mg/dL (ref 0.50–1.10)
GFR calc Af Amer: 68 mL/min — ABNORMAL LOW (ref >90)
GFR calc non Af Amer: 59 mL/min — ABNORMAL LOW (ref >90)
Glucose, Bld: 171 mg/dL — ABNORMAL HIGH (ref 70–99)
POTASSIUM: 3.4 mmol/L — AB (ref 3.5–5.1)
SODIUM: 136 mmol/L (ref 135–145)
Total Bilirubin: 0.6 mg/dL (ref 0.3–1.2)
Total Protein: 7.5 g/dL (ref 6.0–8.3)

## 2014-05-25 LAB — URINALYSIS, ROUTINE W REFLEX MICROSCOPIC
Bilirubin Urine: NEGATIVE
Glucose, UA: NEGATIVE mg/dL
Hgb urine dipstick: NEGATIVE
KETONES UR: NEGATIVE mg/dL
NITRITE: NEGATIVE
Protein, ur: NEGATIVE mg/dL
SPECIFIC GRAVITY, URINE: 1.019 (ref 1.005–1.030)
Urobilinogen, UA: 1 mg/dL (ref 0.0–1.0)
pH: 7 (ref 5.0–8.0)

## 2014-05-25 LAB — LIPASE, BLOOD: Lipase: 27 U/L (ref 11–59)

## 2014-05-25 MED ORDER — SODIUM CHLORIDE 0.9 % IV BOLUS (SEPSIS)
1000.0000 mL | Freq: Once | INTRAVENOUS | Status: AC
  Administered 2014-05-25: 1000 mL via INTRAVENOUS

## 2014-05-25 MED ORDER — ONDANSETRON 4 MG PO TBDP
8.0000 mg | ORAL_TABLET | Freq: Once | ORAL | Status: AC
  Administered 2014-05-25: 8 mg via ORAL

## 2014-05-25 MED ORDER — IOHEXOL 300 MG/ML  SOLN
100.0000 mL | Freq: Once | INTRAMUSCULAR | Status: AC | PRN
  Administered 2014-05-25: 100 mL via INTRAVENOUS

## 2014-05-25 MED ORDER — ONDANSETRON 4 MG PO TBDP
ORAL_TABLET | ORAL | Status: AC
  Filled 2014-05-25: qty 2

## 2014-05-25 MED ORDER — IOHEXOL 300 MG/ML  SOLN
25.0000 mL | INTRAMUSCULAR | Status: AC
  Administered 2014-05-25: 25 mL via ORAL

## 2014-05-25 NOTE — ED Provider Notes (Signed)
CSN: [id number]     Arrival date & time 05/25/14  1859 History   First MD Initiated Contact with Patient 05/25/14 2201     Chief Complaint  Patient presents with  . Abdominal Pain    The patient said she has been having nausea,  and vomiting since saturday.  She rates her pain 9/10.  . Nausea  . Emesis     (Consider location/radiation/quality/duration/timing/severity/associated sxs/prior Treatment) HPI  Pt is an 81yo female with hx of anemia, aortic insufficiency, colon cancer, diverticulosis of colon, HTN, and abdominal surgical hx significant for appendectomy, and cholecystectomy during colon surgery, brought to ED by mother with concern for dehydration as pt has had nausea and vomiting with abdominal pain for 2 days.  Pt states vomiting has improved, however, abdominal pain is 9/10, aching and sore, no pain medication PTA.  Pt states she had not had a BM for 3 days but did have a small BM this morning. Denies fever, chills, diarrhea.  Denies sick contacts or recent travel. No urinary symptoms.   PCP: Dr. Elby Showers  Past Medical History  Diagnosis Date  . ANEMIA 10/21/2008  . AORTIC INSUFFICIENCY 11/26/2009  . BACK PAIN 06/27/2007  . COLON CANCER, PERSONAL HX 02/25/2010  . Diverticulosis of colon (without mention of hemorrhage) 08/14/2006  . GALLSTONES 10/09/2008  . GLAUCOMA NOS 08/14/2006  . HYPERTENSION 08/14/2006  . HYPOKALEMIA, HX OF 11/26/2009  . MELENA, HX OF 10/12/2008  . NEOPLASM, MALIGNANT, COLON, CECUM 10/30/2008  . OCCLUSION, CENTRAL RETINAL ARTERY 08/14/2006  . PERSONAL HX COLONIC POLYPS 10/21/2008  . SYNCOPE, HX OF 08/14/2006  . URI 09/07/2008  . H/O: stroke     behind right eye   Past Surgical History  Procedure Laterality Date  . Appendectomy    . Cataract extraction      bilateral  . Dilation and curettage of uterus    . Tonsillectomy    . Colon surgery      11/20/2008,gallbladder removed  . Ankle fracture surgery  2011    right   Family History  Problem  Relation Age of Onset  . Stroke Neg Hx   . Heart disease Neg Hx   . Breast cancer Sister   . Heart attack Father   . Lung cancer Brother    History  Substance Use Topics  . Smoking status: Never Smoker   . Smokeless tobacco: Never Used  . Alcohol Use: No   OB History    No data available     Review of Systems  Constitutional: Negative for fever and chills.  Respiratory: Negative for cough and shortness of breath.   Gastrointestinal: Positive for nausea, vomiting, abdominal pain and constipation. Negative for diarrhea and blood in stool.  Genitourinary: Negative for dysuria, frequency, hematuria, genital sores and pelvic pain.  All other systems reviewed and are negative.     Allergies  Morphine and related; Sulfa antibiotics; Amoxicillin; Ciprofloxacin; Methocarbamol; and Trimpex  Home Medications   Prior to Admission medications   Medication Sig Start Date End Date Taking? Authorizing Provider  aspirin 81 MG tablet Take 1 tablet (81 mg total) by mouth daily. 12/05/10  Yes Marletta Lor, MD  atenolol (TENORMIN) 50 MG tablet TAKE ONE TABLET BY MOUTH ONCE DAILY 03/25/14  Yes Marletta Lor, MD  bimatoprost (LUMIGAN) 0.01 % SOLN Place 1 drop into the right eye at bedtime.   Yes Historical Provider, MD  brimonidine (ALPHAGAN P) 0.1 % SOLN Place 1 drop into both eyes every  8 (eight) hours.    Yes Historical Provider, MD  Calcium Carb-Cholecalciferol (CALCIUM 600/VITAMIN D3) 600-800 MG-UNIT TABS Take 1 tablet by mouth daily.   Yes Historical Provider, MD  dorzolamide-timolol (COSOPT) 22.3-6.8 MG/ML ophthalmic solution Place 1 drop into both eyes 3 (three) times daily.    Yes Historical Provider, MD  vitamin C (ASCORBIC ACID) 500 MG tablet Take 500 mg by mouth daily.   Yes Historical Provider, MD  atropine 1 % ophthalmic solution Place 1 drop into the left eye 2 (two) times daily.     Historical Provider, MD  Difluprednate (DUREZOL) 0.05 % EMUL Place 1 drop into the left  eye 4 (four) times daily.    Historical Provider, MD  furosemide (LASIX) 20 MG tablet TAKE ONE TABLET BY MOUTH ONCE DAILY AS NEEDED FOR SWELLING Patient not taking: Reported on 05/25/2014    Marletta Lor, MD   BP 158/50 mmHg  Pulse 64  Temp(Src) 97.8 F (36.6 C) (Oral)  Resp 16  Ht 5\' 7"  (1.702 m)  Wt 163 lb (73.936 kg)  BMI 25.52 kg/m2  SpO2 95% Physical Exam  Constitutional: She appears well-developed and well-nourished. No distress.  HENT:  Head: Normocephalic and atraumatic.  Nose: Nose normal.  Mouth/Throat: Uvula is midline and oropharynx is clear and moist. Mucous membranes are dry.  Eyes: Conjunctivae are normal. No scleral icterus.  Neck: Normal range of motion.  Cardiovascular: Normal rate, regular rhythm and normal heart sounds.   Pulmonary/Chest: Effort normal and breath sounds normal. No respiratory distress. She has no wheezes. She has no rales. She exhibits no tenderness.  Abdominal: Soft. Bowel sounds are normal. She exhibits no distension and no mass. There is tenderness. There is no rebound and no guarding.  Soft, non-distended, tenderness to epigastrium and umbilical region. No rebound, guarding or masses.  Musculoskeletal: Normal range of motion.  Neurological: She is alert.  Skin: Skin is warm and dry. She is not diaphoretic.  Nursing note and vitals reviewed.   ED Course  Procedures (including critical care time) Labs Review Labs Reviewed  CBC WITH DIFFERENTIAL/PLATELET - Abnormal; Notable for the following:    WBC 16.8 (*)    Neutrophils Relative % 83 (*)    Neutro Abs 14.0 (*)    Lymphocytes Relative 10 (*)    Monocytes Absolute 1.2 (*)    All other components within normal limits  COMPREHENSIVE METABOLIC PANEL - Abnormal; Notable for the following:    Potassium 3.4 (*)    Glucose, Bld 171 (*)    AST 40 (*)    GFR calc non Af Amer 59 (*)    GFR calc Af Amer 68 (*)    All other components within normal limits  URINALYSIS, ROUTINE W REFLEX  MICROSCOPIC - Abnormal; Notable for the following:    Color, Urine AMBER (*)    APPearance TURBID (*)    Leukocytes, UA MODERATE (*)    All other components within normal limits  URINE MICROSCOPIC-ADD ON - Abnormal; Notable for the following:    Squamous Epithelial / LPF FEW (*)    Bacteria, UA MANY (*)    All other components within normal limits  URINE CULTURE  LIPASE, BLOOD    Imaging Review Ct Abdomen Pelvis W Contrast  05/26/2014   CLINICAL DATA:  Acute onset of nausea and generalized abdominal pain. Vomiting. Initial encounter.  EXAM: CT ABDOMEN AND PELVIS WITH CONTRAST  TECHNIQUE: Multidetector CT imaging of the abdomen and pelvis was performed using the standard  protocol following bolus administration of intravenous contrast.  CONTRAST:  118mL OMNIPAQUE IOHEXOL 300 MG/ML  SOLN  COMPARISON:  CT of the abdomen and pelvis performed 06/24/2012  FINDINGS: Minimal bibasilar atelectasis is noted. Scattered coronary artery calcifications are seen. There is mild wall thickening along the distal esophagus, with a small hiatal hernia.  A 2.5 cm slightly exophytic cyst is noted arising near the inferior tip of the liver. Smaller hepatic cysts are also noted. The patient is status post cholecystectomy, with clips noted along the gallbladder fossa. The pancreas and adrenal glands are unremarkable.  Scattered bilateral renal cysts are seen, measuring up to 5.0 cm in size. Mild nonspecific perinephric stranding is noted bilaterally. The kidneys are otherwise unremarkable. There is no evidence of hydronephrosis. No renal or ureteral stones are seen.  There is gradual decompression of small bowel within the abdomen and pelvis, possibly reflecting mild small bowel dysmotility. The proximal small bowel measures up to 4.2 cm, without evidence of a transition point. Trace free fluid is noted tracking about small bowel loops, though this is of uncertain significance. The stomach is within normal limits. No acute  vascular abnormalities are seen. Scattered calcification is seen along the lower aorta and its branches.  The patient is status post appendectomy. Mild scattered diverticulosis is noted along the ascending, descending and sigmoid colon. There is no evidence of diverticulitis.  The bladder is mildly distended and grossly unremarkable. The uterus is within normal limits. The ovaries are relatively symmetric. No suspicious adnexal masses are seen. No inguinal lymphadenopathy is seen.  No acute osseous abnormalities are identified. Vacuum phenomenon and disc space narrowing are noted at L4-L5. Underlying facet disease is noted.  IMPRESSION: 1. Distention of the proximal small bowel, though there is parietal decompression of small bowel loops to the level of the ascending colon, without evidence of a transition point. This may reflect some degree of small bowel dysmotility. No definite evidence for obstruction. 2. Mild wall thickening along the distal esophagus, possibly reflecting mild esophagitis. Small hiatal hernia seen. 3. Trace free fluid noted surrounding small bowel loops. 4. Mild scattered diverticulosis along the ascending, descending and sigmoid colon. 5. Scattered hepatic cysts and renal cysts noted. 6. Scattered coronary artery calcifications seen. 7. Scattered calcification along the abdominal aorta and its branches.   Electronically Signed   By: Garald Balding M.D.   On: 05/26/2014 00:25     EKG Interpretation None      MDM   Final diagnoses:  UTI (lower urinary tract infection)  Generalized abdominal pain    Pt is an 81yo female brought to ED by daughter for concerns for dehydration as pt c/o abdominal pain with associated nausea and vomiting.  Pt does have abdominal tenderness on exam.  Urine concerning for UTI.  CT abd: nonspecific with distention of proximal small bowel, though there is parietal decompression of the small bowel loops to level of ascending colon w/o evidence of  obstrution.  Discussed pt with Dr. Vanita Panda and reviewed results with family. Daughter and pt feel most comfortable having pt stay over night as pt still having moderate abdominal pain.  Pt is allergic to amoxicillin, sulfa, and cipro as well as morphine and related drugs.  Will give Rocephin IV. 12:57 AM Consulted with Dr. Eulas Post, [facility], agreed to admit pt for UTI and abdominal pain. Pt hemodynamically stable. Temporary orders placed for med-surg bed.       Noland Fordyce, PA-C 05/26/14 0272  Carmin Muskrat, MD 05/28/14 [phone]

## 2014-05-25 NOTE — ED Notes (Signed)
The patient said she has been having nausea,  and vomiting since saturday.  She rates her pain 9/10.  Her daughter brought her in because she thinks the patient is dehydrated.

## 2014-05-26 ENCOUNTER — Ambulatory Visit: Payer: Medicare Other | Admitting: Internal Medicine

## 2014-05-26 DIAGNOSIS — N390 Urinary tract infection, site not specified: Secondary | ICD-10-CM | POA: Diagnosis not present

## 2014-05-26 DIAGNOSIS — Z880 Allergy status to penicillin: Secondary | ICD-10-CM | POA: Diagnosis not present

## 2014-05-26 DIAGNOSIS — Z888 Allergy status to other drugs, medicaments and biological substances status: Secondary | ICD-10-CM | POA: Diagnosis not present

## 2014-05-26 DIAGNOSIS — Z79899 Other long term (current) drug therapy: Secondary | ICD-10-CM | POA: Diagnosis not present

## 2014-05-26 DIAGNOSIS — Z8673 Personal history of transient ischemic attack (TIA), and cerebral infarction without residual deficits: Secondary | ICD-10-CM | POA: Diagnosis not present

## 2014-05-26 DIAGNOSIS — Z9841 Cataract extraction status, right eye: Secondary | ICD-10-CM | POA: Diagnosis not present

## 2014-05-26 DIAGNOSIS — R1084 Generalized abdominal pain: Secondary | ICD-10-CM

## 2014-05-26 DIAGNOSIS — H409 Unspecified glaucoma: Secondary | ICD-10-CM | POA: Diagnosis present

## 2014-05-26 DIAGNOSIS — E860 Dehydration: Secondary | ICD-10-CM | POA: Diagnosis present

## 2014-05-26 DIAGNOSIS — Z7982 Long term (current) use of aspirin: Secondary | ICD-10-CM | POA: Diagnosis not present

## 2014-05-26 DIAGNOSIS — E876 Hypokalemia: Secondary | ICD-10-CM | POA: Diagnosis not present

## 2014-05-26 DIAGNOSIS — Z882 Allergy status to sulfonamides status: Secondary | ICD-10-CM | POA: Diagnosis not present

## 2014-05-26 DIAGNOSIS — Z9842 Cataract extraction status, left eye: Secondary | ICD-10-CM | POA: Diagnosis not present

## 2014-05-26 DIAGNOSIS — Z881 Allergy status to other antibiotic agents status: Secondary | ICD-10-CM | POA: Diagnosis not present

## 2014-05-26 DIAGNOSIS — Z85038 Personal history of other malignant neoplasm of large intestine: Secondary | ICD-10-CM | POA: Diagnosis not present

## 2014-05-26 DIAGNOSIS — I10 Essential (primary) hypertension: Secondary | ICD-10-CM | POA: Diagnosis present

## 2014-05-26 LAB — BASIC METABOLIC PANEL
ANION GAP: 7 (ref 5–15)
BUN: 9 mg/dL (ref 6–23)
CO2: 29 mmol/L (ref 19–32)
Calcium: 8.1 mg/dL — ABNORMAL LOW (ref 8.4–10.5)
Chloride: 101 mmol/L (ref 96–112)
Creatinine, Ser: 0.8 mg/dL (ref 0.50–1.10)
GFR calc non Af Amer: 63 mL/min — ABNORMAL LOW (ref >90)
GFR, EST AFRICAN AMERICAN: 74 mL/min — AB (ref >90)
Glucose, Bld: 122 mg/dL — ABNORMAL HIGH (ref 70–99)
POTASSIUM: 3.1 mmol/L — AB (ref 3.5–5.1)
Sodium: 137 mmol/L (ref 135–145)

## 2014-05-26 LAB — CBC
HEMATOCRIT: 38.4 % (ref 36.0–46.0)
Hemoglobin: 12.9 g/dL (ref 12.0–15.0)
MCH: 29.9 pg (ref 26.0–34.0)
MCHC: 33.6 g/dL (ref 30.0–36.0)
MCV: 89.1 fL (ref 78.0–100.0)
PLATELETS: 247 10*3/uL (ref 150–400)
RBC: 4.31 MIL/uL (ref 3.87–5.11)
RDW: 14.4 % (ref 11.5–15.5)
WBC: 10.8 10*3/uL — ABNORMAL HIGH (ref 4.0–10.5)

## 2014-05-26 MED ORDER — POTASSIUM CHLORIDE 10 MEQ/100ML IV SOLN
10.0000 meq | INTRAVENOUS | Status: AC
  Administered 2014-05-26: 10 meq via INTRAVENOUS
  Filled 2014-05-26 (×4): qty 100

## 2014-05-26 MED ORDER — ACETAMINOPHEN 650 MG RE SUPP: 650.0000 mg | Freq: Four times a day (QID) | RECTAL | Status: DC | PRN

## 2014-05-26 MED ORDER — ONDANSETRON HCL 4 MG PO TABS: 4.0000 mg | ORAL_TABLET | Freq: Four times a day (QID) | ORAL | Status: DC | PRN

## 2014-05-26 MED ORDER — VITAMIN C 500 MG PO TABS
500.0000 mg | ORAL_TABLET | Freq: Every day | ORAL | Status: DC
  Administered 2014-05-26 – 2014-05-28 (×3): 500 mg via ORAL
  Filled 2014-05-26 (×3): qty 1

## 2014-05-26 MED ORDER — ONDANSETRON HCL 4 MG/2ML IJ SOLN: 4.0000 mg | Freq: Once | INTRAMUSCULAR | Status: DC

## 2014-05-26 MED ORDER — ENOXAPARIN SODIUM 40 MG/0.4ML ~~LOC~~ SOLN
40.0000 mg | SUBCUTANEOUS | Status: DC
  Administered 2014-05-26 – 2014-05-27 (×2): 40 mg via SUBCUTANEOUS
  Filled 2014-05-26 (×3): qty 0.4

## 2014-05-26 MED ORDER — DEXTROSE 5 % IV SOLN
1.0000 g | Freq: Once | INTRAVENOUS | Status: AC
  Administered 2014-05-26: 1 g via INTRAVENOUS
  Filled 2014-05-26: qty 10

## 2014-05-26 MED ORDER — CEFTRIAXONE SODIUM IN DEXTROSE 20 MG/ML IV SOLN
1.0000 g | INTRAVENOUS | Status: DC
  Administered 2014-05-27: 1 g via INTRAVENOUS
  Filled 2014-05-26 (×2): qty 50

## 2014-05-26 MED ORDER — CALCIUM CARBONATE-VITAMIN D 500-200 MG-UNIT PO TABS
1.0000 | ORAL_TABLET | Freq: Every day | ORAL | Status: DC
  Administered 2014-05-26 – 2014-05-28 (×3): 1 via ORAL
  Filled 2014-05-26 (×3): qty 1

## 2014-05-26 MED ORDER — PROMETHAZINE HCL 25 MG/ML IJ SOLN
12.5000 mg | Freq: Once | INTRAMUSCULAR | Status: AC
  Administered 2014-05-26: 12.5 mg via INTRAVENOUS
  Filled 2014-05-26: qty 1

## 2014-05-26 MED ORDER — POTASSIUM CHLORIDE 20 MEQ/15ML (10%) PO SOLN
30.0000 meq | Freq: Once | ORAL | Status: AC
Start: 2014-05-26 — End: 2014-05-26
  Administered 2014-05-26: 30 meq via ORAL
  Filled 2014-05-26: qty 22.5

## 2014-05-26 MED ORDER — DORZOLAMIDE HCL-TIMOLOL MAL 2-0.5 % OP SOLN
1.0000 [drp] | Freq: Three times a day (TID) | OPHTHALMIC | Status: DC
  Administered 2014-05-26 – 2014-05-28 (×5): 1 [drp] via OPHTHALMIC
  Filled 2014-05-26: qty 10

## 2014-05-26 MED ORDER — PANTOPRAZOLE SODIUM 40 MG IV SOLR
40.0000 mg | INTRAVENOUS | Status: DC
  Administered 2014-05-26: 40 mg via INTRAVENOUS
  Filled 2014-05-26 (×2): qty 40

## 2014-05-26 MED ORDER — ASPIRIN 81 MG PO CHEW
81.0000 mg | CHEWABLE_TABLET | Freq: Every day | ORAL | Status: DC
  Administered 2014-05-26 – 2014-05-28 (×3): 81 mg via ORAL
  Filled 2014-05-26 (×3): qty 1

## 2014-05-26 MED ORDER — SODIUM CHLORIDE 0.9 % IV SOLN
INTRAVENOUS | Status: DC
  Administered 2014-05-26: 1000 mL via INTRAVENOUS
  Administered 2014-05-27: 03:00:00 via INTRAVENOUS

## 2014-05-26 MED ORDER — ACETAMINOPHEN 325 MG PO TABS: 650.0000 mg | ORAL_TABLET | Freq: Four times a day (QID) | ORAL | Status: DC | PRN

## 2014-05-26 MED ORDER — LATANOPROST 0.005 % OP SOLN
1.0000 [drp] | Freq: Four times a day (QID) | OPHTHALMIC | Status: DC
  Administered 2014-05-26 – 2014-05-28 (×7): 1 [drp] via OPHTHALMIC
  Filled 2014-05-26: qty 2.5

## 2014-05-26 MED ORDER — BRIMONIDINE TARTRATE 0.2 % OP SOLN
1.0000 [drp] | Freq: Three times a day (TID) | OPHTHALMIC | Status: DC
  Administered 2014-05-26 – 2014-05-28 (×7): 1 [drp] via OPHTHALMIC
  Filled 2014-05-26: qty 5

## 2014-05-26 MED ORDER — WHITE PETROLATUM GEL
Status: AC
  Administered 2014-05-26: 0.2
  Filled 2014-05-26: qty 1

## 2014-05-26 MED ORDER — ATENOLOL 50 MG PO TABS
50.0000 mg | ORAL_TABLET | Freq: Every day | ORAL | Status: DC
  Administered 2014-05-26 – 2014-05-28 (×3): 50 mg via ORAL
  Filled 2014-05-26 (×3): qty 1

## 2014-05-26 MED ORDER — ONDANSETRON HCL 4 MG/2ML IJ SOLN: 4.0000 mg | Freq: Four times a day (QID) | INTRAMUSCULAR | Status: DC | PRN

## 2014-05-26 NOTE — ED Notes (Signed)
Patient vomited and complaining of pain.  Patient states she just wants to sleep.  She is very tired.  New orders for nausea.

## 2014-05-26 NOTE — H&P (Signed)
[facility] History and Physical  Karen [name] KAJ:[id number] DOB: [birth year] DOA: 05/25/2014  PCP: Nyoka Cowden, MD   Chief Complaint: N/V and abdominal pain  HPI: Karen [name] is a 81 y.o. woman who was brought to the ED by her granddaughter for evaluation of N/V and abdominal pain since Saturday.  She has had multiple episodes of non-bloody emesis daily.  She has had diffuse abdominal pain, 7 out of 10 in intensity at its worst.  No fevers, chills, or sweats.  No sick contacts.  No diarrhea.  Last BM on Friday.  No dysuria.  ER evaluation concerning for abnormal U/A consistent with UTI and abnormal CT concerning for early bowel obstruction.  She has a history of colon cancer and prior intraabdominal surgery, so she is certainly at risk.  She is being admitted for observation.  Review of Systems: Reports poor vision, particularly since last eye surgery in December.  Otherwise, 10 systems reviewed and negative except as stated in the HPI.  Past Medical History  Diagnosis Date  . ANEMIA 10/21/2008  . AORTIC INSUFFICIENCY 11/26/2009  . BACK PAIN 06/27/2007  . COLON CANCER, PERSONAL HX 02/25/2010  . Diverticulosis of colon (without mention of hemorrhage) 08/14/2006  . GALLSTONES 10/09/2008  . GLAUCOMA NOS 08/14/2006  . HYPERTENSION 08/14/2006  . HYPOKALEMIA, HX OF 11/26/2009  . MELENA, HX OF 10/12/2008  . NEOPLASM, MALIGNANT, COLON, CECUM 10/30/2008  . OCCLUSION, CENTRAL RETINAL ARTERY 08/14/2006  . PERSONAL HX COLONIC POLYPS 10/21/2008  . SYNCOPE, HX OF 08/14/2006  . URI 09/07/2008  . H/O: stroke     behind right eye   Past Surgical History  Procedure Laterality Date  . Appendectomy    . Cataract extraction      bilateral  . Dilation and curettage of uterus    . Tonsillectomy    . Colon surgery      11/20/2008,gallbladder removed  . Ankle fracture surgery  2011    right   Social History:  No tobacco, EtOH, or illicit drug use.  She is a widow.  She has three children,  eight grandchildren, and four great grandchildren.  Allergies  Allergen Reactions  . Morphine And Related Shortness Of Breath  . Sulfa Antibiotics Itching and Swelling  . Amoxicillin Itching and Rash  . Ciprofloxacin Itching and Rash  . Methocarbamol Itching  . Trimpex [Trimethoprim] Itching and Rash    Family History  Problem Relation Age of Onset  . Stroke Neg Hx   . Heart disease Neg Hx   . Breast cancer Sister   . Heart attack Father   . Lung cancer Brother    Prior to Admission medications   Medication Sig Start Date End Date Taking? Authorizing Provider  aspirin 81 MG tablet Take 1 tablet (81 mg total) by mouth daily. 12/05/10  Yes Marletta Lor, MD  atenolol (TENORMIN) 50 MG tablet TAKE ONE TABLET BY MOUTH ONCE DAILY 03/25/14  Yes Marletta Lor, MD  bimatoprost (LUMIGAN) 0.01 % SOLN Place 1 drop into the right eye at bedtime.   Yes Historical Provider, MD  brimonidine (ALPHAGAN P) 0.1 % SOLN Place 1 drop into both eyes every 8 (eight) hours.    Yes Historical Provider, MD  Calcium Carb-Cholecalciferol (CALCIUM 600/VITAMIN D3) 600-800 MG-UNIT TABS Take 1 tablet by mouth daily.   Yes Historical Provider, MD  dorzolamide-timolol (COSOPT) 22.3-6.8 MG/ML ophthalmic solution Place 1 drop into both eyes 3 (three) times daily.    Yes Historical Provider, MD  vitamin C (ASCORBIC ACID) 500 MG tablet Take 500 mg by mouth daily.   Yes Historical Provider, MD  atropine 1 % ophthalmic solution Place 1 drop into the left eye 2 (two) times daily.     Historical Provider, MD  Difluprednate (DUREZOL) 0.05 % EMUL Place 1 drop into the left eye 4 (four) times daily.    Historical Provider, MD  furosemide (LASIX) 20 MG tablet TAKE ONE TABLET BY MOUTH ONCE DAILY AS NEEDED FOR SWELLING Patient not taking: Reported on 05/25/2014    Marletta Lor, MD   Physical Exam: Filed Vitals:   05/26/14 0000 05/26/14 0030 05/26/14 0100 05/26/14 0150  BP: 156/64 156/52 172/71 161/68  Pulse: 61  57 66 57  Temp:    97.5 F (36.4 C)  TempSrc:    Oral  Resp: 16 15 15 17   Height:    5\' 5"  (1.651 m)  Weight:    74.707 kg (164 lb 11.2 oz)  SpO2: 93% 94% 92% 96%    General:  Sleeping but arousable.  NAD.  Eyes: pupils sluggish bilaterally, pale conjunctiva, irritated sclera in left eye  ENT: mucous membranes are dry  Cardiovascular: NR/RR.  No LE edema.  Respiratory: CTA bilaterally.  Abdomen: soft and compressible.  No significant tenderness.  No guarding.  Bowel sounds are present.  Skin: Warm and dry.  Musculoskeletal: Moves all four extremities spontaneously.  Neurologic: No focal deficits.  Labs on Admission:  Basic Metabolic Panel:  Recent Labs Lab 05/25/14 1929  NA 136  K 3.4*  CL 99  CO2 25  GLUCOSE 171*  BUN 11  CREATININE 0.85  CALCIUM 9.4   Liver Function Tests:  Recent Labs Lab 05/25/14 1929  AST 40*  ALT 21  ALKPHOS 71  BILITOT 0.6  PROT 7.5  ALBUMIN 3.5    Recent Labs Lab 05/25/14 1929  LIPASE 27   CBC:  Recent Labs Lab 05/25/14 1929  WBC 16.8*  NEUTROABS 14.0*  HGB 14.6  HCT 42.4  MCV 87.1  PLT 299   Radiological Exams on Admission: Ct Abdomen Pelvis W Contrast  05/26/2014   CLINICAL DATA:  Acute onset of nausea and generalized abdominal pain. Vomiting. Initial encounter.  EXAM: CT ABDOMEN AND PELVIS WITH CONTRAST  TECHNIQUE: Multidetector CT imaging of the abdomen and pelvis was performed using the standard protocol following bolus administration of intravenous contrast.  CONTRAST:  141mL OMNIPAQUE IOHEXOL 300 MG/ML  SOLN  COMPARISON:  CT of the abdomen and pelvis performed 06/24/2012  FINDINGS: Minimal bibasilar atelectasis is noted. Scattered coronary artery calcifications are seen. There is mild wall thickening along the distal esophagus, with a small hiatal hernia.  A 2.5 cm slightly exophytic cyst is noted arising near the inferior tip of the liver. Smaller hepatic cysts are also noted. The patient is status post  cholecystectomy, with clips noted along the gallbladder fossa. The pancreas and adrenal glands are unremarkable.  Scattered bilateral renal cysts are seen, measuring up to 5.0 cm in size. Mild nonspecific perinephric stranding is noted bilaterally. The kidneys are otherwise unremarkable. There is no evidence of hydronephrosis. No renal or ureteral stones are seen.  There is gradual decompression of small bowel within the abdomen and pelvis, possibly reflecting mild small bowel dysmotility. The proximal small bowel measures up to 4.2 cm, without evidence of a transition point. Trace free fluid is noted tracking about small bowel loops, though this is of uncertain significance. The stomach is within normal limits. No acute vascular abnormalities  are seen. Scattered calcification is seen along the lower aorta and its branches.  The patient is status post appendectomy. Mild scattered diverticulosis is noted along the ascending, descending and sigmoid colon. There is no evidence of diverticulitis.  The bladder is mildly distended and grossly unremarkable. The uterus is within normal limits. The ovaries are relatively symmetric. No suspicious adnexal masses are seen. No inguinal lymphadenopathy is seen.  No acute osseous abnormalities are identified. Vacuum phenomenon and disc space narrowing are noted at L4-L5. Underlying facet disease is noted.  IMPRESSION: 1. Distention of the proximal small bowel, though there is parietal decompression of small bowel loops to the level of the ascending colon, without evidence of a transition point. This may reflect some degree of small bowel dysmotility. No definite evidence for obstruction. 2. Mild wall thickening along the distal esophagus, possibly reflecting mild esophagitis. Small hiatal hernia seen. 3. Trace free fluid noted surrounding small bowel loops. 4. Mild scattered diverticulosis along the ascending, descending and sigmoid colon. 5. Scattered hepatic cysts and renal  cysts noted. 6. Scattered coronary artery calcifications seen. 7. Scattered calcification along the abdominal aorta and its branches.   Electronically Signed   By: Garald Balding M.D.   On: 05/26/2014 00:25    Assessment/Plan Active Problems:   UTI (lower urinary tract infection)   Abdominal pain   1. Admit for observation 2. UTI --IV rocephin --culture pending  3.  Dehydration --Gentle IV fluid resuscitation  4.  Abdominal pain with abnormal CT--NPO except meds for now --Serial abdominal exams --Anti-emetics as needed --Consider repeat imaging and gen surg consult in AM --Consider clear liquid trial in AM if nausea controlled --Monitor for flatus/BM  5.  Hypokalemia --Replace as needed  Code Status: FULL  Time spent: 50 minutes  [employer] [facility]  05/26/2014, 3:38 AM

## 2014-05-26 NOTE — Progress Notes (Signed)
Patient admitted after midnight- please see H&P. 1. UTI --IV rocephin --culture pending  2.   Dehydration --Gentle IV fluid resuscitation  3. Abdominal pain with abnormal CT (small bowel dysmotility)- -clears -had 2 BMs, has good bowel sounds -replace K -ambulate  4. Hypokalemia --Replace as needed  Eulogio Bear DO

## 2014-05-26 NOTE — Progress Notes (Signed)
UR completed 

## 2014-05-26 NOTE — ED Notes (Signed)
Patient returned from CT

## 2014-05-27 DIAGNOSIS — E876 Hypokalemia: Secondary | ICD-10-CM

## 2014-05-27 LAB — CLOSTRIDIUM DIFFICILE BY PCR: CDIFFPCR: NEGATIVE

## 2014-05-27 LAB — BASIC METABOLIC PANEL
ANION GAP: 8 (ref 5–15)
BUN: 6 mg/dL (ref 6–23)
CHLORIDE: 108 mmol/L (ref 96–112)
CO2: 23 mmol/L (ref 19–32)
Calcium: 7.7 mg/dL — ABNORMAL LOW (ref 8.4–10.5)
Creatinine, Ser: 0.67 mg/dL (ref 0.50–1.10)
GFR calc non Af Amer: 76 mL/min — ABNORMAL LOW (ref >90)
GFR, EST AFRICAN AMERICAN: 88 mL/min — AB (ref >90)
Glucose, Bld: 94 mg/dL (ref 70–99)
Potassium: 3.2 mmol/L — ABNORMAL LOW (ref 3.5–5.1)
Sodium: 139 mmol/L (ref 135–145)

## 2014-05-27 LAB — CBC
HCT: 35.2 % — ABNORMAL LOW (ref 36.0–46.0)
Hemoglobin: 11.5 g/dL — ABNORMAL LOW (ref 12.0–15.0)
MCH: 29.4 pg (ref 26.0–34.0)
MCHC: 32.7 g/dL (ref 30.0–36.0)
MCV: 90 fL (ref 78.0–100.0)
PLATELETS: 192 10*3/uL (ref 150–400)
RBC: 3.91 MIL/uL (ref 3.87–5.11)
RDW: 14.6 % (ref 11.5–15.5)
WBC: 7.3 10*3/uL (ref 4.0–10.5)

## 2014-05-27 MED ORDER — POTASSIUM CHLORIDE CRYS ER 20 MEQ PO TBCR
40.0000 meq | EXTENDED_RELEASE_TABLET | Freq: Once | ORAL | Status: AC
  Administered 2014-05-27: 40 meq via ORAL
  Filled 2014-05-27: qty 2

## 2014-05-27 MED ORDER — PANTOPRAZOLE SODIUM 40 MG PO TBEC
40.0000 mg | DELAYED_RELEASE_TABLET | Freq: Every day | ORAL | Status: DC
Start: 2014-05-27 — End: 2014-05-28
  Administered 2014-05-27 – 2014-05-28 (×2): 40 mg via ORAL
  Filled 2014-05-27 (×2): qty 1

## 2014-05-27 NOTE — Progress Notes (Addendum)
PROGRESS NOTE  Karen [name]:[id number] DOB: 04/03/1922 DOA: 05/25/2014 PCP: Nyoka Cowden, MD  Assessment/Plan: 1. UTI- > 100,000 GNR --IV rocephin --culture pending  2. Dehydration --Gentle IV fluid resuscitation  3. Abdominal pain with abnormal CT (small bowel dysmotility)- -clears -had 2 BMs, has good bowel sounds -replace K -ambulate  4. Hypokalemia --Replace as needed   Code Status: full Family Communication: patient/called both daughters no answer Disposition Plan: home in AM   Consultants:    Procedures:      HPI/Subjective: bowels moving this AM  Objective: Filed Vitals:   05/27/14 0916  BP: 150/56  Pulse: 65  Temp: 97.6 F (36.4 C)  Resp: 16    Intake/Output Summary (Last 24 hours) at 05/27/14 0959 Last data filed at 05/27/14 8676  Gross per 24 hour  Intake   2882 ml  Output    600 ml  Net   2282 ml   Filed Weights   05/26/14 0150 05/26/14 2004 05/26/14 2119  Weight: 74.707 kg (164 lb 11.2 oz) 62.8 kg (138 lb 7.2 oz) 73.483 kg (162 lb)    Exam:   General:  A+Ox3, NAd  Cardiovascular: rrr  Respiratory: clear  Abdomen: +BS, soft  Musculoskeletal: no edema   Data Reviewed: Basic Metabolic Panel:  Recent Labs Lab 05/25/14 1929 05/26/14 0525 05/27/14 0700  NA 136 137 139  K 3.4* 3.1* 3.2*  CL 99 101 108  CO2 25 29 23   GLUCOSE 171* 122* 94  BUN 11 9 6   CREATININE 0.85 0.80 0.67  CALCIUM 9.4 8.1* 7.7*   Liver Function Tests:  Recent Labs Lab 05/25/14 1929  AST 40*  ALT 21  ALKPHOS 71  BILITOT 0.6  PROT 7.5  ALBUMIN 3.5    Recent Labs Lab 05/25/14 1929  LIPASE 27   No results for input(s): AMMONIA in the last 168 hours. CBC:  Recent Labs Lab 05/25/14 1929 05/26/14 0525 05/27/14 0700  WBC 16.8* 10.8* 7.3  NEUTROABS 14.0*  --   --   HGB 14.6 12.9 11.5*  HCT 42.4 38.4 35.2*  MCV 87.1 89.1 90.0  PLT 299 247 192   Cardiac Enzymes: No results for input(s): CKTOTAL, CKMB,  CKMBINDEX, TROPONINI in the last 168 hours. BNP (last 3 results) No results for input(s): BNP in the last 8760 hours.  ProBNP (last 3 results)  Recent Labs  03/16/14 2007  PROBNP 356.4    CBG: No results for input(s): GLUCAP in the last 168 hours.  Recent Results (from the past 240 hour(s))  Urine culture     Status: None (Preliminary result)   Collection Time: 05/25/14 11:02 PM  Result Value Ref Range Status   Specimen Description URINE, RANDOM  Final   Special Requests ADDED 0106 05/26/14  Final   Colony Count   Final    >=100,000 COLONIES/ML Performed at [place]    Culture   Final    [place] Performed at [facility]    Report Status PENDING  Incomplete     Studies: Ct Abdomen Pelvis W Contrast  05/26/2014   CLINICAL DATA:  Acute onset of nausea and generalized abdominal pain. Vomiting. Initial encounter.  EXAM: CT ABDOMEN AND PELVIS WITH CONTRAST  TECHNIQUE: Multidetector CT imaging of the abdomen and pelvis was performed using the standard protocol following bolus administration of intravenous contrast.  CONTRAST:  164mL OMNIPAQUE IOHEXOL 300 MG/ML  SOLN  COMPARISON:  CT of the abdomen and pelvis performed 06/24/2012  FINDINGS: Minimal  bibasilar atelectasis is noted. Scattered coronary artery calcifications are seen. There is mild wall thickening along the distal esophagus, with a small hiatal hernia.  A 2.5 cm slightly exophytic cyst is noted arising near the inferior tip of the liver. Smaller hepatic cysts are also noted. The patient is status post cholecystectomy, with clips noted along the gallbladder fossa. The pancreas and adrenal glands are unremarkable.  Scattered bilateral renal cysts are seen, measuring up to 5.0 cm in size. Mild nonspecific perinephric stranding is noted bilaterally. The kidneys are otherwise unremarkable. There is no evidence of hydronephrosis. No renal or ureteral stones are seen.  There is gradual decompression of  small bowel within the abdomen and pelvis, possibly reflecting mild small bowel dysmotility. The proximal small bowel measures up to 4.2 cm, without evidence of a transition point. Trace free fluid is noted tracking about small bowel loops, though this is of uncertain significance. The stomach is within normal limits. No acute vascular abnormalities are seen. Scattered calcification is seen along the lower aorta and its branches.  The patient is status post appendectomy. Mild scattered diverticulosis is noted along the ascending, descending and sigmoid colon. There is no evidence of diverticulitis.  The bladder is mildly distended and grossly unremarkable. The uterus is within normal limits. The ovaries are relatively symmetric. No suspicious adnexal masses are seen. No inguinal lymphadenopathy is seen.  No acute osseous abnormalities are identified. Vacuum phenomenon and disc space narrowing are noted at L4-L5. Underlying facet disease is noted.  IMPRESSION: 1. Distention of the proximal small bowel, though there is parietal decompression of small bowel loops to the level of the ascending colon, without evidence of a transition point. This may reflect some degree of small bowel dysmotility. No definite evidence for obstruction. 2. Mild wall thickening along the distal esophagus, possibly reflecting mild esophagitis. Small hiatal hernia seen. 3. Trace free fluid noted surrounding small bowel loops. 4. Mild scattered diverticulosis along the ascending, descending and sigmoid colon. 5. Scattered hepatic cysts and renal cysts noted. 6. Scattered coronary artery calcifications seen. 7. Scattered calcification along the abdominal aorta and its branches.   Electronically Signed   By: Garald Balding M.D.   On: 05/26/2014 00:25    Scheduled Meds: . aspirin  81 mg Oral Daily  . atenolol  50 mg Oral Daily  . brimonidine  1 drop Both Eyes 3 times per day  . calcium-vitamin D  1 tablet Oral Daily  . cefTRIAXone  (ROCEPHIN)  IV  1 g Intravenous Q24H  . dorzolamide-timolol  1 drop Both Eyes TID  . enoxaparin (LOVENOX) injection  40 mg Subcutaneous Q24H  . latanoprost  1 drop Left Eye QID  . pantoprazole  40 mg Oral Daily  . potassium chloride  40 mEq Oral Once  . vitamin C  500 mg Oral Daily   Continuous Infusions: . sodium chloride 75 mL/hr at 05/27/14 0549   Antibiotics Given (last 72 hours)    Date/Time Action Medication Dose Rate   05/27/14 0016 Given   cefTRIAXone (ROCEPHIN) 1 g in dextrose 5 % 50 mL IVPB - Premix 1 g 100 mL/hr      Active Problems:   UTI (lower urinary tract infection)   Abdominal pain    Time spent: 25 min    Cathaleen Korol, [facility] Pager [phone]. If 7PM-7AM, please contact night-coverage at [email or web address], password [facility] 05/27/2014, 9:59 AM  LOS: 1 day

## 2014-05-28 LAB — URINE CULTURE: Colony Count: >=100000

## 2014-05-28 LAB — BASIC METABOLIC PANEL
ANION GAP: 7 (ref 5–15)
BUN: 6 mg/dL (ref 6–23)
CALCIUM: 7.8 mg/dL — AB (ref 8.4–10.5)
CO2: 24 mmol/L (ref 19–32)
Chloride: 108 mmol/L (ref 96–112)
Creatinine, Ser: 0.75 mg/dL (ref 0.50–1.10)
GFR calc non Af Amer: 73 mL/min — ABNORMAL LOW (ref >90)
GFR, EST AFRICAN AMERICAN: 84 mL/min — AB (ref >90)
Glucose, Bld: 104 mg/dL — ABNORMAL HIGH (ref 70–99)
POTASSIUM: 3.3 mmol/L — AB (ref 3.5–5.1)
Sodium: 139 mmol/L (ref 135–145)

## 2014-05-28 LAB — MAGNESIUM: Magnesium: 1.9 mg/dL (ref 1.5–2.5)

## 2014-05-28 MED ORDER — CEPHALEXIN 500 MG PO CAPS
500.0000 mg | ORAL_CAPSULE | Freq: Two times a day (BID) | ORAL | Status: DC
  Administered 2014-05-28: 500 mg via ORAL
  Filled 2014-05-28 (×2): qty 1

## 2014-05-28 MED ORDER — POTASSIUM CHLORIDE CRYS ER 20 MEQ PO TBCR
40.0000 meq | EXTENDED_RELEASE_TABLET | Freq: Once | ORAL | Status: AC
  Administered 2014-05-28: 40 meq via ORAL
  Filled 2014-05-28: qty 2

## 2014-05-28 NOTE — Discharge Summary (Signed)
Physician Discharge Summary  BRENNAH QURAISHI XAJ:[id number] DOB: 08-24-1922 DOA: 05/25/2014  PCP: Nyoka Cowden, MD  Admit date: 05/25/2014 Discharge date: 05/28/2014  Time spent: 35 minutes  Recommendations for Outpatient Follow-up:  1. Bowel regimen for daily BMs 2. Goal for K of 4  Discharge Diagnoses:  Active Problems:   UTI (lower urinary tract infection)   Abdominal pain   Discharge Condition: improved  Diet recommendation: regular  Filed Weights   05/26/14 2004 05/26/14 2119 05/27/14 2052  Weight: 62.8 kg (138 lb 7.2 oz) 73.483 kg (162 lb) 73.483 kg (162 lb)    History of present illness:  Karen [name] is a 81 y.o. woman who was brought to the ED by her granddaughter for evaluation of N/V and abdominal pain since Saturday. She has had multiple episodes of non-bloody emesis daily. She has had diffuse abdominal pain, 7 out of 10 in intensity at its worst. No fevers, chills, or sweats. No sick contacts. No diarrhea. Last BM on Friday. No dysuria. ER evaluation concerning for abnormal U/A consistent with UTI and abnormal CT concerning for early bowel obstruction. She has a history of colon cancer and prior intraabdominal surgery, so she is certainly at risk. She is being admitted for observation.  Hospital Course:  1. UTI- kleb --treated   2. Dehydration --Gentle IV fluid resuscitation  3. Abdominal pain with abnormal CT (small bowel dysmotility)- -clears -had 2 BMs, has good bowel sounds -replace K -ambulate  4. Hypokalemia --Replace as needed  Procedures:    Consultations:  *  Discharge Exam: Filed Vitals:   05/28/14 0848  BP: 160/81  Pulse: 70  Temp: 98.5 F (36.9 C)  Resp: 17    General: pleasant/cooperative Cardiovascular: rrr Respiratory: clear  Discharge Instructions   Discharge Instructions    Diet general    Complete by:  As directed      Discharge instructions    Complete by:  As directed   Use  miralax/colase/fiber for daily BMs     Increase activity slowly    Complete by:  As directed           Current Discharge Medication List    CONTINUE these medications which have NOT CHANGED   Details  aspirin 81 MG tablet Take 1 tablet (81 mg total) by mouth daily. Qty: 90 tablet, Refills: 6    atenolol (TENORMIN) 50 MG tablet TAKE ONE TABLET BY MOUTH ONCE DAILY Qty: 90 tablet, Refills: 0    bimatoprost (LUMIGAN) 0.01 % SOLN Place 1 drop into the right eye at bedtime.    brimonidine (ALPHAGAN P) 0.1 % SOLN Place 1 drop into both eyes every 8 (eight) hours.     Calcium Carb-Cholecalciferol (CALCIUM 600/VITAMIN D3) 600-800 MG-UNIT TABS Take 1 tablet by mouth daily.    dorzolamide-timolol (COSOPT) 22.3-6.8 MG/ML ophthalmic solution Place 1 drop into both eyes 3 (three) times daily.     vitamin C (ASCORBIC ACID) 500 MG tablet Take 500 mg by mouth daily.    atropine 1 % ophthalmic solution Place 1 drop into the left eye 2 (two) times daily.       STOP taking these medications     Difluprednate (DUREZOL) 0.05 % EMUL      furosemide (LASIX) 20 MG tablet        Allergies  Allergen Reactions  . Morphine And Related Shortness Of Breath  . Sulfa Antibiotics Itching and Swelling  . Amoxicillin Itching and Rash  . Ciprofloxacin Itching and Rash  .  Methocarbamol Itching  . Trimpex [Trimethoprim] Itching and Rash   Follow-up Information    Follow up with Nyoka Cowden, MD In 1 week.   Specialty:  Internal Medicine   Why:  BMP re K   Contact information:   [place] [address] [phone]        The results of significant diagnostics from this hospitalization (including imaging, microbiology, ancillary and laboratory) are listed below for reference.    Significant Diagnostic Studies: Ct Abdomen Pelvis W Contrast  05/26/2014   CLINICAL DATA:  Acute onset of nausea and generalized abdominal pain. Vomiting. Initial encounter.  EXAM: CT  ABDOMEN AND PELVIS WITH CONTRAST  TECHNIQUE: Multidetector CT imaging of the abdomen and pelvis was performed using the standard protocol following bolus administration of intravenous contrast.  CONTRAST:  169mL OMNIPAQUE IOHEXOL 300 MG/ML  SOLN  COMPARISON:  CT of the abdomen and pelvis performed 06/24/2012  FINDINGS: Minimal bibasilar atelectasis is noted. Scattered coronary artery calcifications are seen. There is mild wall thickening along the distal esophagus, with a small hiatal hernia.  A 2.5 cm slightly exophytic cyst is noted arising near the inferior tip of the liver. Smaller hepatic cysts are also noted. The patient is status post cholecystectomy, with clips noted along the gallbladder fossa. The pancreas and adrenal glands are unremarkable.  Scattered bilateral renal cysts are seen, measuring up to 5.0 cm in size. Mild nonspecific perinephric stranding is noted bilaterally. The kidneys are otherwise unremarkable. There is no evidence of hydronephrosis. No renal or ureteral stones are seen.  There is gradual decompression of small bowel within the abdomen and pelvis, possibly reflecting mild small bowel dysmotility. The proximal small bowel measures up to 4.2 cm, without evidence of a transition point. Trace free fluid is noted tracking about small bowel loops, though this is of uncertain significance. The stomach is within normal limits. No acute vascular abnormalities are seen. Scattered calcification is seen along the lower aorta and its branches.  The patient is status post appendectomy. Mild scattered diverticulosis is noted along the ascending, descending and sigmoid colon. There is no evidence of diverticulitis.  The bladder is mildly distended and grossly unremarkable. The uterus is within normal limits. The ovaries are relatively symmetric. No suspicious adnexal masses are seen. No inguinal lymphadenopathy is seen.  No acute osseous abnormalities are identified. Vacuum phenomenon and disc space  narrowing are noted at L4-L5. Underlying facet disease is noted.  IMPRESSION: 1. Distention of the proximal small bowel, though there is parietal decompression of small bowel loops to the level of the ascending colon, without evidence of a transition point. This may reflect some degree of small bowel dysmotility. No definite evidence for obstruction. 2. Mild wall thickening along the distal esophagus, possibly reflecting mild esophagitis. Small hiatal hernia seen. 3. Trace free fluid noted surrounding small bowel loops. 4. Mild scattered diverticulosis along the ascending, descending and sigmoid colon. 5. Scattered hepatic cysts and renal cysts noted. 6. Scattered coronary artery calcifications seen. 7. Scattered calcification along the abdominal aorta and its branches.   Electronically Signed   By: Garald Balding M.D.   On: 05/26/2014 00:25    Microbiology: Recent Results (from the past 240 hour(s))  Urine culture     Status: None   Collection Time: 05/25/14 11:02 PM  Result Value Ref Range Status   Specimen Description URINE, RANDOM  Final   Special Requests ADDED 0106 05/26/14  Final   Colony Count   Final    >=  100,000 COLONIES/ML Performed at [place] Performed at [facility]    Report Status 05/28/2014 FINAL  Final   Organism ID, Bacteria KLEBSIELLA PNEUMONIAE  Final      Susceptibility   Klebsiella pneumoniae - MIC*    AMPICILLIN RESISTANT      CEFAZOLIN <=4 SENSITIVE Sensitive     CEFTRIAXONE <=1 SENSITIVE Sensitive     CIPROFLOXACIN <=0.25 SENSITIVE Sensitive     GENTAMICIN <=1 SENSITIVE Sensitive     LEVOFLOXACIN 0.25 SENSITIVE Sensitive     NITROFURANTOIN <=16 SENSITIVE Sensitive     TOBRAMYCIN <=1 SENSITIVE Sensitive     TRIMETH/SULFA <=20 SENSITIVE Sensitive     PIP/TAZO <=4 SENSITIVE Sensitive     * KLEBSIELLA PNEUMONIAE  Clostridium Difficile by PCR     Status: None   Collection Time: 05/27/14  1:53 PM   Result Value Ref Range Status   C difficile by pcr NEGATIVE NEGATIVE Final     Labs: Basic Metabolic Panel:  Recent Labs Lab 05/25/14 1929 05/26/14 0525 05/27/14 0700 05/28/14 0311 05/28/14 0331  NA 136 137 139  --  139  K 3.4* 3.1* 3.2*  --  3.3*  CL 99 101 108  --  108  CO2 25 29 23   --  24  GLUCOSE 171* 122* 94  --  104*  BUN 11 9 6   --  6  CREATININE 0.85 0.80 0.67  --  0.75  CALCIUM 9.4 8.1* 7.7*  --  7.8*  MG  --   --   --  1.9  --    Liver Function Tests:  Recent Labs Lab 05/25/14 1929  AST 40*  ALT 21  ALKPHOS 71  BILITOT 0.6  PROT 7.5  ALBUMIN 3.5    Recent Labs Lab 05/25/14 1929  LIPASE 27   No results for input(s): AMMONIA in the last 168 hours. CBC:  Recent Labs Lab 05/25/14 1929 05/26/14 0525 05/27/14 0700  WBC 16.8* 10.8* 7.3  NEUTROABS 14.0*  --   --   HGB 14.6 12.9 11.5*  HCT 42.4 38.4 35.2*  MCV 87.1 89.1 90.0  PLT 299 247 192   Cardiac Enzymes: No results for input(s): CKTOTAL, CKMB, CKMBINDEX, TROPONINI in the last 168 hours. BNP: BNP (last 3 results) No results for input(s): BNP in the last 8760 hours.  ProBNP (last 3 results)  Recent Labs  03/16/14 2007  PROBNP 356.4    CBG: No results for input(s): GLUCAP in the last 168 hours.     SignedEulogio Bear  [facility] 05/28/2014, 9:35 AM

## 2014-05-28 NOTE — Progress Notes (Signed)
Patient's IV became infiltrated and was removed. Pt refusing to have another IV started. Educated patient on her needing another dose of IV antibiotics and IV fluid. Will continue to monitor.   Shelbie Hutching, RN

## 2014-05-28 NOTE — Progress Notes (Signed)
Patient Discharge:  Disposition: Pt discharged home with granddaughter  Education: Pt and granddaughter educated on medications, follow up appointments, and all discharged instructions. Pt and granddaughter verbalized understanding.   IV: Removed  Telemetry: N/A  Follow-up appointments: Pt granddaughter states she will make follow up appointment with PCP in one week.   Prescriptions:N/A   Transportation: Pt transported home via car by granddaughter.   Belongings:All belongings taken with pt.

## 2014-06-01 ENCOUNTER — Encounter: Payer: Self-pay | Admitting: Internal Medicine

## 2014-06-01 ENCOUNTER — Telehealth: Payer: Self-pay | Admitting: Internal Medicine

## 2014-06-01 ENCOUNTER — Ambulatory Visit (INDEPENDENT_AMBULATORY_CARE_PROVIDER_SITE_OTHER): Payer: Medicare Other | Admitting: Internal Medicine

## 2014-06-01 VITALS — BP 120/80 | HR 63 | Temp 97.5°F | Resp 20 | Ht 65.0 in | Wt 164.0 lb

## 2014-06-01 DIAGNOSIS — R1013 Epigastric pain: Secondary | ICD-10-CM

## 2014-06-01 DIAGNOSIS — I10 Essential (primary) hypertension: Secondary | ICD-10-CM

## 2014-06-01 DIAGNOSIS — Z85038 Personal history of other malignant neoplasm of large intestine: Secondary | ICD-10-CM

## 2014-06-01 DIAGNOSIS — F329 Major depressive disorder, single episode, unspecified: Secondary | ICD-10-CM

## 2014-06-01 DIAGNOSIS — G4700 Insomnia, unspecified: Secondary | ICD-10-CM

## 2014-06-01 DIAGNOSIS — N390 Urinary tract infection, site not specified: Secondary | ICD-10-CM

## 2014-06-01 DIAGNOSIS — F32A Depression, unspecified: Secondary | ICD-10-CM

## 2014-06-01 MED ORDER — ESCITALOPRAM OXALATE 5 MG PO TABS: 5.0000 mg | ORAL_TABLET | Freq: Every day | ORAL | Status: DC

## 2014-06-01 NOTE — Telephone Encounter (Signed)
[facility] - Client [place] Patient Name: Karen [name] DOB: [birth year] Initial Comment Caller states, grand mother was in the hospital last week, released on Thurs, expecting an Rx for bladder infection and UTI, but she did not receive it. Nurse Assessment Nurse: Ronnald Ramp, RN, Miranda Date/Time (Eastern Time): 06/01/2014 9:39:15 AM Confirm and document reason for call. If symptomatic, describe symptoms. ---Caller states her grandmother was in the hospital. Discharged on Thursday. She was diagnosed with UTI and there was some confusion about if antibiotics were to be prescribed upon discharge. She is having vaginal burning when urinating. Has the patient traveled out of the country within the last 30 days? ---Not Applicable Does the patient require triage? ---Yes Related visit to physician within the last 2 weeks? ---Yes Does the PT have any chronic conditions? (i.e. diabetes, asthma, etc.) ---Yes List chronic conditions. ---HTN, Glaucoma Guidelines Guideline Title Affirmed Question Affirmed Notes Urination Pain - Female Age > 30 years Final Disposition User See Physician within 24 Hours Ronnald Ramp, RN, Miranda Comments Appt scheduled with Dr. Burnice Logan at 1pm today.

## 2014-06-01 NOTE — Patient Instructions (Signed)
Insomnia Insomnia is frequent trouble falling and/or staying asleep. Insomnia can be a long term problem or a short term problem. Both are common. Insomnia can be a short term problem when the wakefulness is related to a certain stress or worry. Long term insomnia is often related to ongoing stress during waking hours and/or poor sleeping habits. Overtime, sleep deprivation itself can make the problem worse. Every little thing feels more severe because you are overtired and your ability to cope is decreased. CAUSES   Stress, anxiety, and depression.  Poor sleeping habits.  Distractions such as TV in the bedroom.  Naps close to bedtime.  Engaging in emotionally charged conversations before bed.  Technical reading before sleep.  Alcohol and other sedatives. They may make the problem worse. They can hurt normal sleep patterns and normal dream activity.  Stimulants such as caffeine for several hours prior to bedtime.  Pain syndromes and shortness of breath can cause insomnia.  Exercise late at night.  Changing time zones may cause sleeping problems (jet lag). It is sometimes helpful to have someone observe your sleeping patterns. They should look for periods of not breathing during the night (sleep apnea). They should also look to see how long those periods last. If you live alone or observers are uncertain, you can also be observed at a sleep clinic where your sleep patterns will be professionally monitored. Sleep apnea requires a checkup and treatment. Give your caregivers your medical history. Give your caregivers observations your family has made about your sleep.  SYMPTOMS   Not feeling rested in the morning.  Anxiety and restlessness at bedtime.  Difficulty falling and staying asleep. TREATMENT   Your caregiver may prescribe treatment for an underlying medical disorders. Your caregiver can give advice or help if you are using alcohol or other drugs for self-medication. Treatment  of underlying problems will usually eliminate insomnia problems.  Medications can be prescribed for short time use. They are generally not recommended for lengthy use.  Over-the-counter sleep medicines are not recommended for lengthy use. They can be habit forming.  You can promote easier sleeping by making lifestyle changes such as:  Using relaxation techniques that help with breathing and reduce muscle tension.  Exercising earlier in the day.  Changing your diet and the time of your last meal. No night time snacks.  Establish a regular time to go to bed.  Counseling can help with stressful problems and worry.  Soothing music and white noise may be helpful if there are background noises you cannot remove.  Stop tedious detailed work at least one hour before bedtime. HOME CARE INSTRUCTIONS   Keep a diary. Inform your caregiver about your progress. This includes any medication side effects. See your caregiver regularly. Take note of:  Times when you are asleep.  Times when you are awake during the night.  The quality of your sleep.  How you feel the next day. This information will help your caregiver care for you.  Get out of bed if you are still awake after 15 minutes. Read or do some quiet activity. Keep the lights down. Wait until you feel sleepy and go back to bed.  Keep regular sleeping and waking hours. Avoid naps.  Exercise regularly.  Avoid distractions at bedtime. Distractions include watching television or engaging in any intense or detailed activity like attempting to balance the household checkbook.  Develop a bedtime ritual. Keep a familiar routine of bathing, brushing your teeth, climbing into bed at the same   time each night, listening to soothing music. Routines increase the success of falling to sleep faster.  Use relaxation techniques. This can be using breathing and muscle tension release routines. It can also include visualizing peaceful scenes. You can  also help control troubling or intruding thoughts by keeping your mind occupied with boring or repetitive thoughts like the old concept of counting sheep. You can make it more creative like imagining planting one beautiful flower after another in your backyard garden.  During your day, work to eliminate stress. When this is not possible use some of the previous suggestions to help reduce the anxiety that accompanies stressful situations. MAKE SURE YOU:   Understand these instructions.  Will watch your condition.  Will get help right away if you are not doing well or get worse. Document Released: 03/10/2000 Document Revised: 06/05/2011 Document Reviewed: 04/10/2007 Exit[employer] Patient Information 2015 [employer]. This information is not intended to replace advice given to you by your health care provider. Make sure you discuss any questions you have with your health care provider.  

## 2014-06-01 NOTE — Progress Notes (Signed)
Subjective:    Patient ID: Karen [name], female    DOB: [birth year], 81 y.o.   MRN: [id number]  HPI   81 year old patient who is seen today following a recent hospital discharge.  She has done well since her discharge.  No recurrent abdominal pain, nausea or vomiting.  She describes some vague burning in the vaginal area, but no discharge, urinary frequency or dysuria.  Unable to give a sample today.  She was treated for a Klebsiella UTI during the hospital. Denies any fever Complaints today include some mild insomnia.  She has been using melatonin as well as Tylenol PM. She feels that she has become quite depressed and still adjusting to the death of her spouse  Admit date: 05/25/2014 Discharge date: 05/28/2014  Recommendations for Outpatient Follow-up:  1. Bowel regimen for daily BMs 2. Goal for K of 4  Discharge Diagnoses:  Active Problems:  UTI (lower urinary tract infection)  Abdominal pain   Discharge Condition: improved  Diet recommendation: regular   Past Medical History  Diagnosis Date  . ANEMIA 10/21/2008  . AORTIC INSUFFICIENCY 11/26/2009  . BACK PAIN 06/27/2007  . COLON CANCER, PERSONAL HX 02/25/2010  . Diverticulosis of colon (without mention of hemorrhage) 08/14/2006  . GALLSTONES 10/09/2008  . GLAUCOMA NOS 08/14/2006  . HYPERTENSION 08/14/2006  . HYPOKALEMIA, HX OF 11/26/2009  . MELENA, HX OF 10/12/2008  . NEOPLASM, MALIGNANT, COLON, CECUM 10/30/2008  . OCCLUSION, CENTRAL RETINAL ARTERY 08/14/2006  . PERSONAL HX COLONIC POLYPS 10/21/2008  . SYNCOPE, HX OF 08/14/2006  . URI 09/07/2008  . H/O: stroke     behind right eye    History   Social History  . Marital Status: Married    Spouse Name: N/A  . Number of Children: 3  . Years of Education: N/A   Occupational History  . retired    Social History Main Topics  . Smoking status: Never Smoker   . Smokeless tobacco: Never Used  . Alcohol Use: No  . Drug Use: No  . Sexual Activity: Not on file   Other  Topics Concern  . Not on file   Social History Narrative    Past Surgical History  Procedure Laterality Date  . Appendectomy    . Cataract extraction      bilateral  . Dilation and curettage of uterus    . Tonsillectomy    . Colon surgery      11/20/2008,gallbladder removed  . Ankle fracture surgery  2011    right    Family History  Problem Relation Age of Onset  . Stroke Neg Hx   . Heart disease Neg Hx   . Breast cancer Sister   . Heart attack Father   . Lung cancer Brother     Allergies  Allergen Reactions  . Morphine And Related Shortness Of Breath  . Sulfa Antibiotics Itching and Swelling  . Amoxicillin Itching and Rash  . Ciprofloxacin Itching and Rash  . Methocarbamol Itching  . Trimpex [Trimethoprim] Itching and Rash    Current Outpatient Prescriptions on File Prior to Visit  Medication Sig Dispense Refill  . aspirin 81 MG tablet Take 1 tablet (81 mg total) by mouth daily. 90 tablet 6  . atenolol (TENORMIN) 50 MG tablet TAKE ONE TABLET BY MOUTH ONCE DAILY 90 tablet 0  . bimatoprost (LUMIGAN) 0.01 % SOLN Place 1 drop into the right eye at bedtime.    . brimonidine (ALPHAGAN P) 0.1 % SOLN Place 1 drop into  both eyes every 8 (eight) hours.     . Calcium Carb-Cholecalciferol (CALCIUM 600/VITAMIN D3) 600-800 MG-UNIT TABS Take 1 tablet by mouth daily.    . dorzolamide-timolol (COSOPT) 22.3-6.8 MG/ML ophthalmic solution Place 1 drop into both eyes 3 (three) times daily.     . vitamin C (ASCORBIC ACID) 500 MG tablet Take 500 mg by mouth daily.     No current facility-administered medications on file prior to visit.    BP 120/80 mmHg  Pulse 63  Temp(Src) 97.5 F (36.4 C) (Oral)  Resp 20  Ht 5\' 5"  (1.651 m)  Wt 164 lb (74.39 kg)  BMI 27.29 kg/m2  SpO2 98%    Review of Systems  Constitutional: Negative.   HENT: Negative for congestion, dental problem, hearing loss, rhinorrhea, sinus pressure, sore throat and tinnitus.   Eyes: Negative for pain, discharge  and visual disturbance.  Respiratory: Negative for cough and shortness of breath.   Cardiovascular: Negative for chest pain, palpitations and leg swelling.  Gastrointestinal: Negative for nausea, vomiting, abdominal pain, diarrhea, constipation, blood in stool and abdominal distention.  Genitourinary: Positive for genital sores. Negative for dysuria, urgency, frequency, hematuria, flank pain, vaginal bleeding, vaginal discharge, difficulty urinating, vaginal pain and pelvic pain.  Musculoskeletal: Negative for joint swelling, arthralgias and gait problem.  Skin: Negative for rash.  Neurological: Negative for dizziness, syncope, speech difficulty, weakness, numbness and headaches.  Hematological: Negative for adenopathy.  Psychiatric/Behavioral: Negative for behavioral problems, dysphoric mood and agitation. The patient is not nervous/anxious.        Objective:   Physical Exam  Constitutional: She is oriented to person, place, and time. She appears well-developed and well-nourished.  HENT:  Head: Normocephalic.  Right Ear: External ear normal.  Left Ear: External ear normal.  Mouth/Throat: Oropharynx is clear and moist.  Eyes: Conjunctivae and EOM are normal. Pupils are equal, round, and reactive to light.  Neck: Normal range of motion. Neck supple. No thyromegaly present.  Cardiovascular: Normal rate, regular rhythm, normal heart sounds and intact distal pulses.   Pulmonary/Chest: Effort normal and breath sounds normal.  Abdominal: Soft. Bowel sounds are normal. She exhibits no distension and no mass. There is no tenderness. There is no rebound and no guarding.  Musculoskeletal: Normal range of motion.  Lymphadenopathy:    She has no cervical adenopathy.  Neurological: She is alert and oriented to person, place, and time.  Skin: Skin is warm and dry. No rash noted.  Psychiatric: She has a normal mood and affect. Her behavior is normal.          Assessment & Plan:   History of  recent UTI. Status post surgery for colon cancer Status post recent hospital admission for possible partial small bowel up structure and treated medically Essential hypertension, stable Insomnia.  Sleep hygiene issues discussed.  Will continue melatonin which has been helpful Depression.  Will start on low-dose Lexapro 5 mg.  Follow-up 6 weeks

## 2014-06-02 NOTE — Addendum Note (Signed)
Addended by: Marian Sorrow on: 06/02/2014 08:57 AM   Modules accepted: Orders

## 2014-06-05 ENCOUNTER — Ambulatory Visit: Payer: Medicare Other | Admitting: Internal Medicine

## 2014-06-23 ENCOUNTER — Other Ambulatory Visit: Payer: Self-pay | Admitting: Internal Medicine

## 2014-06-29 ENCOUNTER — Other Ambulatory Visit: Payer: Self-pay | Admitting: *Deleted

## 2014-06-29 DIAGNOSIS — C180 Malignant neoplasm of cecum: Secondary | ICD-10-CM

## 2014-06-30 ENCOUNTER — Telehealth: Payer: Self-pay | Admitting: Oncology

## 2014-06-30 ENCOUNTER — Other Ambulatory Visit (HOSPITAL_BASED_OUTPATIENT_CLINIC_OR_DEPARTMENT_OTHER): Payer: Medicare Other

## 2014-06-30 ENCOUNTER — Ambulatory Visit (HOSPITAL_BASED_OUTPATIENT_CLINIC_OR_DEPARTMENT_OTHER): Payer: Medicare Other | Admitting: Oncology

## 2014-06-30 VITALS — BP 168/62 | HR 59 | Temp 98.1°F | Resp 18 | Ht 65.0 in | Wt 164.1 lb

## 2014-06-30 DIAGNOSIS — R591 Generalized enlarged lymph nodes: Secondary | ICD-10-CM

## 2014-06-30 DIAGNOSIS — C180 Malignant neoplasm of cecum: Secondary | ICD-10-CM

## 2014-06-30 DIAGNOSIS — Z85038 Personal history of other malignant neoplasm of large intestine: Secondary | ICD-10-CM

## 2014-06-30 LAB — COMPREHENSIVE METABOLIC PANEL (CC13)
ALBUMIN: 3.3 g/dL — AB (ref 3.5–5.0)
ALT: 9 U/L (ref 0–55)
AST: 21 U/L (ref 5–34)
Alkaline Phosphatase: 73 U/L (ref 40–150)
Anion Gap: 8 mEq/L (ref 3–11)
BUN: 9.2 mg/dL (ref 7.0–26.0)
CO2: 25 mEq/L (ref 22–29)
Calcium: 8.5 mg/dL (ref 8.4–10.4)
Chloride: 107 mEq/L (ref 98–109)
Creatinine: 0.8 mg/dL (ref 0.6–1.1)
EGFR: 67 mL/min/{1.73_m2} — AB (ref >90)
Glucose: 141 mg/dl — ABNORMAL HIGH (ref 70–140)
Potassium: 3.7 mEq/L (ref 3.5–5.1)
Sodium: 141 mEq/L (ref 136–145)
TOTAL PROTEIN: 6.9 g/dL (ref 6.4–8.3)
Total Bilirubin: 0.32 mg/dL (ref 0.20–1.20)

## 2014-06-30 LAB — CBC WITH DIFFERENTIAL/PLATELET
BASO%: 0.3 % (ref 0.0–2.0)
BASOS ABS: 0 10*3/uL (ref 0.0–0.1)
EOS ABS: 0.1 10*3/uL (ref 0.0–0.5)
EOS%: 1.3 % (ref 0.0–7.0)
HCT: 39.7 % (ref 34.8–46.6)
HEMOGLOBIN: 13 g/dL (ref 11.6–15.9)
LYMPH%: 23.2 % (ref 14.0–49.7)
MCH: 29.7 pg (ref 25.1–34.0)
MCHC: 32.7 g/dL (ref 31.5–36.0)
MCV: 90.8 fL (ref 79.5–101.0)
MONO#: 0.7 10*3/uL (ref 0.1–0.9)
MONO%: 8.7 % (ref 0.0–14.0)
NEUT%: 66.5 % (ref 38.4–76.8)
NEUTROS ABS: 5 10*3/uL (ref 1.5–6.5)
PLATELETS: 231 10*3/uL (ref 145–400)
RBC: 4.37 10*6/uL (ref 3.70–5.45)
RDW: 14.1 % (ref 11.2–14.5)
WBC: 7.5 10*3/uL (ref 3.9–10.3)
lymph#: 1.7 10*3/uL (ref 0.9–3.3)

## 2014-06-30 NOTE — Progress Notes (Signed)
Hematology and Oncology Follow Up Visit  Karen [name] [id number] 02-27-1923 81 y.o. 06/30/2014 9:58 AM  CC: Karen Regal, MD  Karen [name]. Olevia Perches, MD  Karen Lor, MD    Principle Diagnosis: An 81 year old female with the following issues:  1. Stage II colorectal cancer diagnosed in 2010.  She had a T3 N0 disease, status post surgical resection.  2. Questionable inflammatory versus reactive lymphadenopathy that is PET negative.   Prior Therapy: She underwent right hemicolectomy on a T3 N0 disease.  Eleven lymph nodes sampled, 0 involved.  That was done on November 20, 2008.  Current therapy: Observation and suervillance  Interim History:  [name] presents today for a followup visit. Since the last visit, she was hospitalized briefly for small bowel obstruction that spontaneously resolved in March 2016. She had a CT scan during that hospitalization which did not show any evidence of malignancy. She has not reported any abdominal pain, nausea, vomiting or hematochezia since her discharge. She still having some vision problems and have had I surgery in December 2015.  She is performing most activities of daily living without any hindrance or decline.   She does not report any headaches, blurry vision, syncope or seizures. She does not report any fevers, chills, sweats or weight loss. Her appetite continues to be excellent. She does not report any chest pain, palpitation, orthopnea. She does not report any cough or hemoptysis or hematemesis. She does not report any abdominal distention or early satiety. She does not report any frequency urgency or hesitancy. She does not report any skeletal complaints. Remainder review of systems unremarkable.   Medications: I have reviewed the patient's current medications. Current Outpatient Prescriptions  Medication Sig Dispense Refill  . aspirin 81 MG tablet Take 1 tablet (81 mg total) by mouth daily. 90 tablet 6  . atenolol (TENORMIN) 50 MG tablet  TAKE ONE TABLET BY MOUTH ONCE DAILY 90 tablet 1  . bimatoprost (LUMIGAN) 0.01 % SOLN Place 1 drop into the right eye at bedtime.    . brimonidine (ALPHAGAN P) 0.1 % SOLN Place 1 drop into both eyes every 8 (eight) hours.     . Calcium Carb-Cholecalciferol (CALCIUM 600/VITAMIN D3) 600-800 MG-UNIT TABS Take 1 tablet by mouth daily.    . dorzolamide-timolol (COSOPT) 22.3-6.8 MG/ML ophthalmic solution Place 1 drop into both eyes 3 (three) times daily.     Marland Kitchen escitalopram (LEXAPRO) 5 MG tablet Take 1 tablet (5 mg total) by mouth daily. 90 tablet 1  . vitamin C (ASCORBIC ACID) 500 MG tablet Take 500 mg by mouth daily.     No current facility-administered medications for this visit.    Allergies:  Allergies  Allergen Reactions  . Morphine And Related Shortness Of Breath  . Sulfa Antibiotics Itching and Swelling  . Amoxicillin Itching and Rash  . Ciprofloxacin Itching and Rash  . Methocarbamol Itching  . Trimpex [Trimethoprim] Itching and Rash       Physical Exam: Blood pressure 168/62, pulse 59, temperature 98.1 F (36.7 C), temperature source Oral, resp. rate 18, height 5\' 5"  (1.651 m), weight 164 lb 1.6 oz (74.435 kg). ECOG: 1 General appearance: alert awake appeared in no active distress Head: Normocephalic, without obvious abnormality, atraumatic Neck: no adenopathy Lymph nodes: Cervical, supraclavicular, and axillary nodes normal. Heart:regular rate and rhythm, S1, S2 normal, no murmur, click, rub or gallop Lung:chest clear, no wheezing, rales, normal symmetric air entry Abdomin: soft, non-tender, without masses or organomegaly EXT:no erythema, induration, or nodules  Lab Results: Lab Results  Component Value Date   WBC 7.5 06/30/2014   HGB 13.0 06/30/2014   HCT 39.7 06/30/2014   MCV 90.8 06/30/2014   PLT 231 06/30/2014     Chemistry      Component Value Date/Time   NA 139 05/28/2014 0331   NA 141 06/27/2013 1126   NA 145 12/13/2010 1053   K 3.3* 05/28/2014 0331    K 4.2 06/27/2013 1126   K 4.0 12/13/2010 1053   CL 108 05/28/2014 0331   CL 103 06/24/2012 0932   CL 102 12/13/2010 1053   CO2 24 05/28/2014 0331   CO2 25 06/27/2013 1126   CO2 27 12/13/2010 1053   BUN 6 05/28/2014 0331   BUN 14.4 06/27/2013 1126   BUN 15 12/13/2010 1053   CREATININE 0.75 05/28/2014 0331   CREATININE 0.8 06/27/2013 1126   CREATININE 0.8 12/13/2010 1053      Component Value Date/Time   CALCIUM 7.8* 05/28/2014 0331   CALCIUM 9.3 06/27/2013 1126   CALCIUM 9.1 12/13/2010 1053   ALKPHOS 71 05/25/2014 1929   ALKPHOS 71 06/27/2013 1126   ALKPHOS 66 12/13/2010 1053   AST 40* 05/25/2014 1929   AST 23 06/27/2013 1126   AST 22 12/13/2010 1053   ALT 21 05/25/2014 1929   ALT 9 06/27/2013 1126   ALT 16 12/13/2010 1053   BILITOT 0.6 05/25/2014 1929   BILITOT 0.34 06/27/2013 1126   BILITOT 0.50 12/13/2010 1053       EXAM: CT ABDOMEN AND PELVIS WITH CONTRAST  TECHNIQUE: Multidetector CT imaging of the abdomen and pelvis was performed using the standard protocol following bolus administration of intravenous contrast.  CONTRAST: 176mL OMNIPAQUE IOHEXOL 300 MG/ML SOLN  COMPARISON: CT of the abdomen and pelvis performed 06/24/2012  FINDINGS: Minimal bibasilar atelectasis is noted. Scattered coronary artery calcifications are seen. There is mild wall thickening along the distal esophagus, with a small hiatal hernia.  A 2.5 cm slightly exophytic cyst is noted arising near the inferior tip of the liver. Smaller hepatic cysts are also noted. The patient is status post cholecystectomy, with clips noted along the gallbladder fossa. The pancreas and adrenal glands are unremarkable.  Scattered bilateral renal cysts are seen, measuring up to 5.0 cm in size. Mild nonspecific perinephric stranding is noted bilaterally. The kidneys are otherwise unremarkable. There is no evidence of hydronephrosis. No renal or ureteral stones are seen.  There is gradual  decompression of small bowel within the abdomen and pelvis, possibly reflecting mild small bowel dysmotility. The proximal small bowel measures up to 4.2 cm, without evidence of a transition point. Trace free fluid is noted tracking about small bowel loops, though this is of uncertain significance. The stomach is within normal limits. No acute vascular abnormalities are seen. Scattered calcification is seen along the lower aorta and its branches.  The patient is status post appendectomy. Mild scattered diverticulosis is noted along the ascending, descending and sigmoid colon. There is no evidence of diverticulitis.  The bladder is mildly distended and grossly unremarkable. The uterus is within normal limits. The ovaries are relatively symmetric. No suspicious adnexal masses are seen. No inguinal lymphadenopathy is seen.  No acute osseous abnormalities are identified. Vacuum phenomenon and disc space narrowing are noted at L4-L5. Underlying facet disease is noted.  IMPRESSION: 1. Distention of the proximal small bowel, though there is parietal decompression of small bowel loops to the level of the ascending colon, without evidence of a transition point. This may  reflect some degree of small bowel dysmotility. No definite evidence for obstruction. 2. Mild wall thickening along the distal esophagus, possibly reflecting mild esophagitis. Small hiatal hernia seen. 3. Trace free fluid noted surrounding small bowel loops. 4. Mild scattered diverticulosis along the ascending, descending and sigmoid colon. 5. Scattered hepatic cysts and renal cysts noted. 6. Scattered coronary artery calcifications seen. 7. Scattered calcification along the abdominal aorta and its branches.     Impression and Plan:  This is a pleasant 81 year old woman with the following issues:  1. Stage II colorectal cancer. She continues to have no evidence of recurrent disease at this time. Her laboratory  testing, and CT scan images from February 2016 did not show any evidence of recurrent cancer. The plan is to continue with active surveillance and repeat laboratory testing in a clinical visit in 12 months. After that, I see no further need for oncology follow-up. 2. Reactive lymphadenopathy.  Again I feel that this is probably an incidental finding and does not reflect malignancy.  No further workup is necessary.  3.     Colon cancer screening.  She is up-to-date at this point for colon cancer screening.    [facility], MD 4/5/20169:58 AM

## 2014-06-30 NOTE — Telephone Encounter (Signed)
Gave patient avs report and appts for April 2017. No pof. Appointments scheduled based on lab order entered today for one year and office note to f/u 12 months.

## 2014-07-01 ENCOUNTER — Encounter: Payer: Self-pay | Admitting: Internal Medicine

## 2014-07-01 ENCOUNTER — Ambulatory Visit (INDEPENDENT_AMBULATORY_CARE_PROVIDER_SITE_OTHER): Payer: Medicare Other | Admitting: Internal Medicine

## 2014-07-01 VITALS — BP 142/80 | HR 68 | Temp 98.4°F | Resp 20 | Ht 65.0 in | Wt 166.0 lb

## 2014-07-01 DIAGNOSIS — I10 Essential (primary) hypertension: Secondary | ICD-10-CM

## 2014-07-01 DIAGNOSIS — F32A Depression, unspecified: Secondary | ICD-10-CM

## 2014-07-01 DIAGNOSIS — F329 Major depressive disorder, single episode, unspecified: Secondary | ICD-10-CM | POA: Diagnosis not present

## 2014-07-01 NOTE — Patient Instructions (Signed)
Limit your sodium (Salt) intake    It is important that you exercise regularly, at least 20 minutes 3 to 4 times per week.  If you develop chest pain or shortness of breath seek  medical attention.  Return in 4 months for follow-up   

## 2014-07-01 NOTE — Progress Notes (Signed)
Subjective:    Patient ID: Karen [name], female    DOB: 08/17/1922, 82 y.o.   MRN: [id number]  HPI  81 year old patient who is seen today in follow-up.  She was seen about 4 weeks ago with clinical depression.  She was also having some insomnia.  She is accompanied by 2 daughters today.  She has greatly improved and today feels quite well.  She has treated hypertension which has been stable.  She was seen by oncology.  Recently and is scheduled for an annual follow-up.  Past Medical History  Diagnosis Date  . ANEMIA 10/21/2008  . AORTIC INSUFFICIENCY 11/26/2009  . BACK PAIN 06/27/2007  . COLON CANCER, PERSONAL HX 02/25/2010  . Diverticulosis of colon (without mention of hemorrhage) 08/14/2006  . GALLSTONES 10/09/2008  . GLAUCOMA NOS 08/14/2006  . HYPERTENSION 08/14/2006  . HYPOKALEMIA, HX OF 11/26/2009  . MELENA, HX OF 10/12/2008  . NEOPLASM, MALIGNANT, COLON, CECUM 10/30/2008  . OCCLUSION, CENTRAL RETINAL ARTERY 08/14/2006  . PERSONAL HX COLONIC POLYPS 10/21/2008  . SYNCOPE, HX OF 08/14/2006  . URI 09/07/2008  . H/O: stroke     behind right eye    History   Social History  . Marital Status: Married    Spouse Name: N/A  . Number of Children: 3  . Years of Education: N/A   Occupational History  . retired    Social History Main Topics  . Smoking status: Never Smoker   . Smokeless tobacco: Never Used  . Alcohol Use: No  . Drug Use: No  . Sexual Activity: Not on file   Other Topics Concern  . Not on file   Social History Narrative    Past Surgical History  Procedure Laterality Date  . Appendectomy    . Cataract extraction      bilateral  . Dilation and curettage of uterus    . Tonsillectomy    . Colon surgery      11/20/2008,gallbladder removed  . Ankle fracture surgery  2011    right    Family History  Problem Relation Age of Onset  . Stroke Neg Hx   . Heart disease Neg Hx   . Breast cancer Sister   . Heart attack Father   . Lung cancer Brother     Allergies    Allergen Reactions  . Morphine And Related Shortness Of Breath  . Sulfa Antibiotics Itching and Swelling  . Amoxicillin Itching and Rash  . Ciprofloxacin Itching and Rash  . Methocarbamol Itching  . Trimpex [Trimethoprim] Itching and Rash    Current Outpatient Prescriptions on File Prior to Visit  Medication Sig Dispense Refill  . aspirin 81 MG tablet Take 1 tablet (81 mg total) by mouth daily. 90 tablet 6  . atenolol (TENORMIN) 50 MG tablet TAKE ONE TABLET BY MOUTH ONCE DAILY 90 tablet 1  . bimatoprost (LUMIGAN) 0.01 % SOLN Place 1 drop into the right eye at bedtime.    . brimonidine (ALPHAGAN P) 0.1 % SOLN Place 1 drop into both eyes every 8 (eight) hours.     . Calcium Carb-Cholecalciferol (CALCIUM 600/VITAMIN D3) 600-800 MG-UNIT TABS Take 1 tablet by mouth daily.    . dorzolamide-timolol (COSOPT) 22.3-6.8 MG/ML ophthalmic solution Place 1 drop into both eyes 3 (three) times daily.     Marland Kitchen escitalopram (LEXAPRO) 5 MG tablet Take 1 tablet (5 mg total) by mouth daily. 90 tablet 1  . vitamin C (ASCORBIC ACID) 500 MG tablet Take 500 mg by mouth  daily.     No current facility-administered medications on file prior to visit.    BP 142/80 mmHg  Pulse 68  Temp(Src) 98.4 F (36.9 C) (Oral)  Resp 20  Ht 5\' 5"  (1.651 m)  Wt 166 lb (75.297 kg)  BMI 27.62 kg/m2  SpO2 97%     Review of Systems  Constitutional: Negative.   HENT: Negative for congestion, dental problem, hearing loss, rhinorrhea, sinus pressure, sore throat and tinnitus.   Eyes: Negative for pain, discharge and visual disturbance.  Respiratory: Negative for cough and shortness of breath.   Cardiovascular: Negative for chest pain, palpitations and leg swelling.  Gastrointestinal: Negative for nausea, vomiting, abdominal pain, diarrhea, constipation, blood in stool and abdominal distention.  Genitourinary: Negative for dysuria, urgency, frequency, hematuria, flank pain, vaginal bleeding, vaginal discharge, difficulty  urinating, vaginal pain and pelvic pain.  Musculoskeletal: Negative for joint swelling, arthralgias and gait problem.  Skin: Negative for rash.  Neurological: Negative for dizziness, syncope, speech difficulty, weakness, numbness and headaches.  Hematological: Negative for adenopathy.  Psychiatric/Behavioral: Positive for sleep disturbance. Negative for behavioral problems, dysphoric mood and agitation. The patient is nervous/anxious.        Objective:   Physical Exam  Constitutional: She appears well-developed and well-nourished. No distress.  Blood pressure 130/80  Psychiatric: She has a normal mood and affect. Her behavior is normal. Judgment and thought content normal.          Assessment & Plan:  Depression/insomnia.  Very nice clinical response to Lexapro.  Will continue 5 mg daily Hypertension, stable  Recheck 4 months

## 2014-07-01 NOTE — Progress Notes (Signed)
Pre visit review using our clinic review tool, if applicable. No additional management support is needed unless otherwise documented below in the visit note. 

## 2014-07-10 ENCOUNTER — Ambulatory Visit: Payer: Medicare Other | Admitting: Internal Medicine

## 2014-07-16 ENCOUNTER — Ambulatory Visit (INDEPENDENT_AMBULATORY_CARE_PROVIDER_SITE_OTHER): Payer: Medicare Other | Admitting: *Deleted

## 2014-07-16 DIAGNOSIS — Z23 Encounter for immunization: Secondary | ICD-10-CM

## 2014-08-14 DIAGNOSIS — H31412 Hemorrhagic choroidal detachment, left eye: Secondary | ICD-10-CM | POA: Diagnosis not present

## 2014-08-27 DIAGNOSIS — H00036 Abscess of eyelid left eye, unspecified eyelid: Secondary | ICD-10-CM | POA: Diagnosis not present

## 2014-10-01 ENCOUNTER — Ambulatory Visit: Payer: Medicare Other | Admitting: Internal Medicine

## 2014-10-08 ENCOUNTER — Encounter: Payer: Self-pay | Admitting: Internal Medicine

## 2014-10-08 ENCOUNTER — Ambulatory Visit (INDEPENDENT_AMBULATORY_CARE_PROVIDER_SITE_OTHER): Payer: Medicare Other | Admitting: Internal Medicine

## 2014-10-08 VITALS — BP 140/80 | HR 58 | Temp 98.0°F | Resp 18 | Ht 65.0 in | Wt 163.0 lb

## 2014-10-08 DIAGNOSIS — H6123 Impacted cerumen, bilateral: Secondary | ICD-10-CM

## 2014-10-08 NOTE — Progress Notes (Signed)
   Subjective:    Patient ID: Karen [name], female    DOB: [birth year], 81 y.o.   MRN: [id number]  HPI  81 year old patient with a chief complaint of hearing loss.  Review of Systems  HENT: Positive for hearing loss. Negative for ear discharge and ear pain.        Objective:   Physical Exam  Constitutional: She appears well-developed. No distress.  Blood pressure 130/80  HENT:  Bilateral cerumen impactions          Assessment & Plan:   Bilateral cerumen impactions.  Both canals irrigated until clear  Return as scheduled for general follow-up

## 2014-10-08 NOTE — Progress Notes (Signed)
Pre visit review using our clinic review tool, if applicable. No additional management support is needed unless otherwise documented below in the visit note. 

## 2014-10-08 NOTE — Patient Instructions (Signed)
Cerumen Impaction °A cerumen impaction is when the wax in your ear forms a plug. This plug usually causes reduced hearing. Sometimes it also causes an earache or dizziness. Removing a cerumen impaction can be difficult and painful. The wax sticks to the ear canal. The canal is sensitive and bleeds easily. If you try to remove a heavy wax buildup with a cotton tipped swab, you may push it in further. °Irrigation with water, suction, and small ear curettes may be used to clear out the wax. If the impaction is fixed to the skin in the ear canal, ear drops may be needed for a few days to loosen the wax. People who build up a lot of wax frequently can use ear wax removal products available in your local drugstore. °SEEK MEDICAL CARE IF:  °You develop an earache, increased hearing loss, or marked dizziness. °Document Released: 04/20/2004 Document Revised: 06/05/2011 Document Reviewed: 06/10/2009 °[employer]® Patient Information ©2015 [employer]. This information is not intended to replace advice given to you by your health care provider. Make sure you discuss any questions you have with your health care provider. ° °

## 2014-10-30 ENCOUNTER — Ambulatory Visit: Payer: Medicare Other | Admitting: Internal Medicine

## 2014-11-03 ENCOUNTER — Encounter: Payer: Self-pay | Admitting: Internal Medicine

## 2014-11-03 ENCOUNTER — Ambulatory Visit (INDEPENDENT_AMBULATORY_CARE_PROVIDER_SITE_OTHER): Payer: Medicare Other | Admitting: Internal Medicine

## 2014-11-03 VITALS — BP 170/80 | HR 59 | Temp 97.6°F | Ht 65.0 in | Wt 158.0 lb

## 2014-11-03 DIAGNOSIS — I359 Nonrheumatic aortic valve disorder, unspecified: Secondary | ICD-10-CM

## 2014-11-03 DIAGNOSIS — H409 Unspecified glaucoma: Secondary | ICD-10-CM | POA: Diagnosis not present

## 2014-11-03 DIAGNOSIS — I10 Essential (primary) hypertension: Secondary | ICD-10-CM | POA: Diagnosis not present

## 2014-11-03 NOTE — Progress Notes (Signed)
Pre visit review using our clinic review tool, if applicable. No additional management support is needed unless otherwise documented below in the visit note. 

## 2014-11-03 NOTE — Patient Instructions (Signed)
Limit your sodium (Salt) intake  Please check your blood pressure on a regular basis.  If it is consistently greater than 150/90, please make an office appointment.  Return in 6 months for follow-up   

## 2014-11-03 NOTE — Progress Notes (Signed)
Subjective:    Patient ID: Karen [name], female    DOB: [birth year], 81 y.o.   MRN: [id number]  HPI  81 year old patient who is seen today in follow-up.  She has essential hypertension, history of aortic valve disease and glaucoma.  Doing quite well today.  No concerns or complaints.  She was seen recently for cerumen impactions. No new concerns or complaints  Past Medical History  Diagnosis Date  . ANEMIA 10/21/2008  . AORTIC INSUFFICIENCY 11/26/2009  . BACK PAIN 06/27/2007  . COLON CANCER, PERSONAL HX 02/25/2010  . Diverticulosis of colon (without mention of hemorrhage) 08/14/2006  . GALLSTONES 10/09/2008  . GLAUCOMA NOS 08/14/2006  . HYPERTENSION 08/14/2006  . HYPOKALEMIA, HX OF 11/26/2009  . MELENA, HX OF 10/12/2008  . NEOPLASM, MALIGNANT, COLON, CECUM 10/30/2008  . OCCLUSION, CENTRAL RETINAL ARTERY 08/14/2006  . PERSONAL HX COLONIC POLYPS 10/21/2008  . SYNCOPE, HX OF 08/14/2006  . URI 09/07/2008  . H/O: stroke     behind right eye    History   Social History  . Marital Status: Married    Spouse Name: N/A  . Number of Children: 3  . Years of Education: N/A   Occupational History  . retired    Social History Main Topics  . Smoking status: Never Smoker   . Smokeless tobacco: Never Used  . Alcohol Use: No  . Drug Use: No  . Sexual Activity: Not on file   Other Topics Concern  . Not on file   Social History Narrative    Past Surgical History  Procedure Laterality Date  . Appendectomy    . Cataract extraction      bilateral  . Dilation and curettage of uterus    . Tonsillectomy    . Colon surgery      11/20/2008,gallbladder removed  . Ankle fracture surgery  2011    right    Family History  Problem Relation Age of Onset  . Stroke Neg Hx   . Heart disease Neg Hx   . Breast cancer Sister   . Heart attack Father   . Lung cancer Brother     Allergies  Allergen Reactions  . Morphine And Related Shortness Of Breath  . Sulfa Antibiotics Itching and Swelling  .  Amoxicillin Itching and Rash  . Ciprofloxacin Itching and Rash  . Methocarbamol Itching  . Trimpex [Trimethoprim] Itching and Rash    Current Outpatient Prescriptions on File Prior to Visit  Medication Sig Dispense Refill  . aspirin 81 MG tablet Take 1 tablet (81 mg total) by mouth daily. 90 tablet 6  . atenolol (TENORMIN) 50 MG tablet TAKE ONE TABLET BY MOUTH ONCE DAILY 90 tablet 1  . bimatoprost (LUMIGAN) 0.01 % SOLN Place 1 drop into the right eye at bedtime.    . brimonidine (ALPHAGAN P) 0.1 % SOLN Place 1 drop into both eyes every 8 (eight) hours.     . Calcium Carb-Cholecalciferol (CALCIUM 600/VITAMIN D3) 600-800 MG-UNIT TABS Take 1 tablet by mouth daily.    . dorzolamide-timolol (COSOPT) 22.3-6.8 MG/ML ophthalmic solution Place 1 drop into both eyes 3 (three) times daily.     Marland Kitchen escitalopram (LEXAPRO) 5 MG tablet Take 1 tablet (5 mg total) by mouth daily. 90 tablet 1  . vitamin C (ASCORBIC ACID) 500 MG tablet Take 500 mg by mouth daily.     No current facility-administered medications on file prior to visit.    BP 170/80 mmHg  Pulse 59  Temp(Src) 97.6  F (36.4 C)  Ht 5\' 5"  (1.651 m)  Wt 158 lb (71.668 kg)  BMI 26.29 kg/m2     Review of Systems  Constitutional: Negative.   HENT: Negative for congestion, dental problem, hearing loss, rhinorrhea, sinus pressure, sore throat and tinnitus.   Eyes: Negative for pain, discharge and visual disturbance.  Respiratory: Negative for cough and shortness of breath.   Cardiovascular: Negative for chest pain, palpitations and leg swelling.  Gastrointestinal: Negative for nausea, vomiting, abdominal pain, diarrhea, constipation, blood in stool and abdominal distention.  Genitourinary: Negative for dysuria, urgency, frequency, hematuria, flank pain, vaginal bleeding, vaginal discharge, difficulty urinating, vaginal pain and pelvic pain.  Musculoskeletal: Positive for back pain, arthralgias and gait problem. Negative for joint swelling.    Skin: Negative for rash.  Neurological: Negative for dizziness, syncope, speech difficulty, weakness, numbness and headaches.  Hematological: Negative for adenopathy.  Psychiatric/Behavioral: Negative for behavioral problems, dysphoric mood and agitation. The patient is not nervous/anxious.        Objective:   Physical Exam  Constitutional: She is oriented to person, place, and time. She appears well-developed and well-nourished.  Blood pressure 150/78  HENT:  Head: Normocephalic.  Right Ear: External ear normal.  Left Ear: External ear normal.  Mouth/Throat: Oropharynx is clear and moist.  Eyes: Conjunctivae and EOM are normal. Pupils are equal, round, and reactive to light.  Neck: Normal range of motion. Neck supple. No thyromegaly present.  Cardiovascular: Normal rate, regular rhythm, normal heart sounds and intact distal pulses.   Pulmonary/Chest: Effort normal and breath sounds normal.  Abdominal: Soft. Bowel sounds are normal. She exhibits no mass. There is no tenderness.  Musculoskeletal: Normal range of motion.  Lymphadenopathy:    She has no cervical adenopathy.  Neurological: She is alert and oriented to person, place, and time.  Skin: Skin is warm and dry. No rash noted.  Psychiatric: She has a normal mood and affect. Her behavior is normal.          Assessment & Plan:   Hypertension History of colon cancer Aortic valve disorder  Recheck 6 months

## 2014-11-19 ENCOUNTER — Other Ambulatory Visit: Payer: Self-pay | Admitting: Internal Medicine

## 2014-12-05 ENCOUNTER — Encounter (HOSPITAL_COMMUNITY): Payer: Self-pay | Admitting: Emergency Medicine

## 2014-12-05 ENCOUNTER — Emergency Department (INDEPENDENT_AMBULATORY_CARE_PROVIDER_SITE_OTHER)
Admission: EM | Admit: 2014-12-05 | Discharge: 2014-12-05 | Disposition: A | Discharge status: Home / Self Care | Payer: Medicare Other | Source: Home / Self Care | Attending: Family Medicine | Admitting: Family Medicine

## 2014-12-05 DIAGNOSIS — T781XXA Other adverse food reactions, not elsewhere classified, initial encounter: Secondary | ICD-10-CM

## 2014-12-05 MED ORDER — METHYLPREDNISOLONE ACETATE 40 MG/ML IJ SUSP
INTRAMUSCULAR | Status: AC
  Filled 2014-12-05: qty 1

## 2014-12-05 MED ORDER — TRIAMCINOLONE ACETONIDE 40 MG/ML IJ SUSP
40.0000 mg | Freq: Once | INTRAMUSCULAR | Status: AC
  Administered 2014-12-05: 40 mg via INTRAMUSCULAR

## 2014-12-05 MED ORDER — METHYLPREDNISOLONE ACETATE 40 MG/ML IJ SUSP
40.0000 mg | Freq: Once | INTRAMUSCULAR | Status: AC
  Administered 2014-12-05: 40 mg via INTRAMUSCULAR

## 2014-12-05 MED ORDER — HYDROXYZINE HCL 25 MG PO TABS: 25.0000 mg | ORAL_TABLET | Freq: Four times a day (QID) | ORAL | Status: DC

## 2014-12-05 NOTE — Discharge Instructions (Signed)
Use medicine as needed, see your doctor or return if further problems.

## 2014-12-05 NOTE — ED Provider Notes (Signed)
CSN: [id number]     Arrival date & time 12/05/14  1931 History   First MD Initiated Contact with Patient 12/05/14 1945     Chief Complaint  Patient presents with  . Rash   (Consider location/radiation/quality/duration/timing/severity/associated sxs/prior Treatment) Patient is a 81 y.o. female presenting with rash. The history is provided by the patient and a relative.  Rash Location:  Full body Quality: itchiness and redness   Severity:  Moderate Onset quality:  Gradual Duration:  3 hours Chronicity:  New Context comment:  Family and pt feel related to eating peach, no other foods or meds. Ineffective treatments:  Antihistamines Associated symptoms: no fever, no hoarse voice, no shortness of breath, no sore throat, no throat swelling and no tongue swelling     Past Medical History  Diagnosis Date  . ANEMIA 10/21/2008  . AORTIC INSUFFICIENCY 11/26/2009  . BACK PAIN 06/27/2007  . COLON CANCER, PERSONAL HX 02/25/2010  . Diverticulosis of colon (without mention of hemorrhage) 08/14/2006  . GALLSTONES 10/09/2008  . GLAUCOMA NOS 08/14/2006  . HYPERTENSION 08/14/2006  . HYPOKALEMIA, HX OF 11/26/2009  . MELENA, HX OF 10/12/2008  . NEOPLASM, MALIGNANT, COLON, CECUM 10/30/2008  . OCCLUSION, CENTRAL RETINAL ARTERY 08/14/2006  . PERSONAL HX COLONIC POLYPS 10/21/2008  . SYNCOPE, HX OF 08/14/2006  . URI 09/07/2008  . H/O: stroke     behind right eye   Past Surgical History  Procedure Laterality Date  . Appendectomy    . Cataract extraction      bilateral  . Dilation and curettage of uterus    . Tonsillectomy    . Colon surgery      11/20/2008,gallbladder removed  . Ankle fracture surgery  2011    right   Family History  Problem Relation Age of Onset  . Stroke Neg Hx   . Heart disease Neg Hx   . Breast cancer Sister   . Heart attack Father   . Lung cancer Brother    Social History  Substance Use Topics  . Smoking status: Never Smoker   . Smokeless tobacco: Never Used  . Alcohol Use: No    OB History    No data available     Review of Systems  Constitutional: Negative for fever.  HENT: Negative for hoarse voice and sore throat.   Respiratory: Negative.  Negative for shortness of breath.   Cardiovascular: Negative.   Skin: Positive for rash.  All other systems reviewed and are negative.   Allergies  Morphine and related; Sulfa antibiotics; Amoxicillin; Ciprofloxacin; Methocarbamol; and Trimpex  Home Medications   Prior to Admission medications   Medication Sig Start Date End Date Taking? Authorizing Provider  aspirin 81 MG tablet Take 1 tablet (81 mg total) by mouth daily. 12/05/10  Yes Marletta Lor, MD  atenolol (TENORMIN) 50 MG tablet TAKE ONE TABLET BY MOUTH ONCE DAILY 06/23/14  Yes Marletta Lor, MD  Calcium Carb-Cholecalciferol (CALCIUM 600/VITAMIN D3) 600-800 MG-UNIT TABS Take 1 tablet by mouth daily.   Yes Historical Provider, MD  escitalopram (LEXAPRO) 5 MG tablet TAKE 1 TABLET (5 MG TOTAL) BY MOUTH DAILY. 11/19/14  Yes Marletta Lor, MD  vitamin C (ASCORBIC ACID) 500 MG tablet Take 500 mg by mouth daily.   Yes Historical Provider, MD  bimatoprost (LUMIGAN) 0.01 % SOLN Place 1 drop into the right eye at bedtime.    Historical Provider, MD  brimonidine (ALPHAGAN P) 0.1 % SOLN Place 1 drop into both eyes every 8 (eight) hours.  Historical Provider, MD  dorzolamide-timolol (COSOPT) 22.3-6.8 MG/ML ophthalmic solution Place 1 drop into both eyes 3 (three) times daily.     Historical Provider, MD  hydrOXYzine (ATARAX/VISTARIL) 25 MG tablet Take 1 tablet (25 mg total) by mouth every 6 (six) hours. Prn itching 12/05/14   Billy Fischer, MD   Meds Ordered and Administered this Visit   Medications  triamcinolone acetonide (KENALOG-40) injection 40 mg (40 mg Intramuscular Given 12/05/14 2012)  methylPREDNISolone acetate (DEPO-MEDROL) injection 40 mg (40 mg Intramuscular Given 12/05/14 2012)    BP 195/81 mmHg  Pulse 67  Temp(Src) 97.5 F (36.4 C)  (Oral)  Resp 20  SpO2 98% No data found.   Physical Exam  Constitutional: She is oriented to person, place, and time. She appears well-developed and well-nourished. No distress.  HENT:  Mouth/Throat: Oropharynx is clear and moist.  Neck: Normal range of motion. Neck supple.  Cardiovascular: Normal heart sounds.   Pulmonary/Chest: Effort normal and breath sounds normal.  Lymphadenopathy:    She has no cervical adenopathy.  Neurological: She is alert and oriented to person, place, and time.  Skin: Skin is warm and dry. Rash noted. There is erythema.  Nursing note and vitals reviewed.   ED Course  Procedures (including critical care time)  Labs Review Labs Reviewed - No data to display  Imaging Review No results found.   Visual Acuity Review  Right Eye Distance:   Left Eye Distance:   Bilateral Distance:    Right Eye Near:   Left Eye Near:    Bilateral Near:         MDM   1. Allergic reaction to food    rx given for atarax, im kenalog and depomedrol.     Billy Fischer, MD 12/11/14 2003

## 2014-12-05 NOTE — ED Notes (Signed)
C/o rash all over body onset 1.5 hours ago Daughter believes it may be from when she at a peach Denies dyspnea, SOB, difficult swallowing Alert and oriented x4... No acute distress.

## 2014-12-22 ENCOUNTER — Other Ambulatory Visit: Payer: Self-pay | Admitting: Internal Medicine

## 2015-01-12 DIAGNOSIS — Z23 Encounter for immunization: Secondary | ICD-10-CM | POA: Diagnosis not present

## 2015-01-25 ENCOUNTER — Telehealth: Payer: Self-pay | Admitting: Internal Medicine

## 2015-01-25 NOTE — Telephone Encounter (Signed)
[facility] [place] [facility] - Client [facility] Patient Name: TAJ ARTEAGA DOB: 1923-03-09 Initial Comment Caller States grandmother is on depression meds. today she has been crying non-stop, and says she is just very depressed. grandmother asked if she could have 2 of her depression pills. Nurse Assessment Nurse: Markus Daft, RN, Sherre Poot Date/Time Eilene Ghazi Time): 01/25/2015 12:32:37 PM Confirm and document reason for call. If symptomatic, describe symptoms. ---Caller states grandmother is on Lexapro 5 mg QD. Today she has been crying non-stop, and says she is just very depressed. She's had some friends die, and pt's spouse died 2 years ago and missing him. She had a bad dream last night and had upset from this. Grandmother asked if she could have 2 pills instead of just one. Able to get an appt tomorrow for 2:45 pm. Has the patient traveled out of the country within the last 30 days? ---Not Applicable Does the patient have any new or worsening symptoms? ---Yes Will a triage be completed? ---Yes Related visit to physician within the last 2 weeks? ---No Does the PT have any chronic conditions? (i.e. diabetes, asthma, etc.) ---Yes List chronic conditions. ---Depression, Glaucoma, Macular Degeneration, "2 strokes behind her eye". Guidelines Guideline Title Affirmed Question Affirmed Notes Depression [1] Depression AND [2] worsening (e.g.,sleeping poorly, less able to do activities of daily living) Final Disposition User See Physician within [place], [place], [place] Comments RN advised not to increase medications as not ordered by MD, and most likely would not give the "effect" that she is hoping for as it takes 1-2 wks. for noticeable relief of s/s with these meds. Caller agreeable, and will keep appt for tomorrow. Referrals REFERRED TO PCP OFFICE Disagree/Comply: Comply

## 2015-01-26 ENCOUNTER — Encounter: Payer: Self-pay | Admitting: Internal Medicine

## 2015-01-26 ENCOUNTER — Ambulatory Visit (INDEPENDENT_AMBULATORY_CARE_PROVIDER_SITE_OTHER): Payer: Medicare Other | Admitting: Internal Medicine

## 2015-01-26 VITALS — BP 152/90 | HR 55 | Temp 98.3°F | Resp 18 | Ht 65.0 in | Wt 152.0 lb

## 2015-01-26 DIAGNOSIS — H3412 Central retinal artery occlusion, left eye: Secondary | ICD-10-CM | POA: Diagnosis not present

## 2015-01-26 DIAGNOSIS — I10 Essential (primary) hypertension: Secondary | ICD-10-CM | POA: Diagnosis not present

## 2015-01-26 DIAGNOSIS — Z23 Encounter for immunization: Secondary | ICD-10-CM | POA: Diagnosis not present

## 2015-01-26 DIAGNOSIS — F329 Major depressive disorder, single episode, unspecified: Secondary | ICD-10-CM

## 2015-01-26 DIAGNOSIS — F32A Depression, unspecified: Secondary | ICD-10-CM

## 2015-01-26 MED ORDER — ESCITALOPRAM OXALATE 10 MG PO TABS: 10.0000 mg | ORAL_TABLET | Freq: Every day | ORAL | Status: DC

## 2015-01-26 MED ORDER — ALPRAZOLAM 0.25 MG PO TABS: 0.2500 mg | ORAL_TABLET | Freq: Two times a day (BID) | ORAL | Status: DC | PRN

## 2015-01-26 NOTE — Progress Notes (Signed)
Pre visit review using our clinic review tool, if applicable. No additional management support is needed unless otherwise documented below in the visit note. 

## 2015-01-26 NOTE — Progress Notes (Signed)
Subjective:    Patient ID: Karen [name], female    DOB: 01/25/1923, 81 y.o.   MRN: [id number]  HPI 81 year old patient who is seen today in follow-up.  For the past few weeks she has had much worsening depression.  She has been sleeping poorly with excessive worry and frequent episodes of crying.  There have been a number of stressors including her poor health with marked decline in her visual acuity.  She now is not able to watch TV or read.  She is widowed and recently has lost close friends.  She has been on low-dose Lexapro, which was quite helpful earlier, but she has done poorly over the past few weeks.  She has essential hypertension.  She lives with a granddaughter who can assist with administration of her medications.  Past Medical History  Diagnosis Date  . ANEMIA 10/21/2008  . AORTIC INSUFFICIENCY 11/26/2009  . BACK PAIN 06/27/2007  . COLON CANCER, PERSONAL HX 02/25/2010  . Diverticulosis of colon (without mention of hemorrhage) 08/14/2006  . GALLSTONES 10/09/2008  . GLAUCOMA NOS 08/14/2006  . HYPERTENSION 08/14/2006  . HYPOKALEMIA, HX OF 11/26/2009  . MELENA, HX OF 10/12/2008  . NEOPLASM, MALIGNANT, COLON, CECUM 10/30/2008  . OCCLUSION, CENTRAL RETINAL ARTERY 08/14/2006  . PERSONAL HX COLONIC POLYPS 10/21/2008  . SYNCOPE, HX OF 08/14/2006  . URI 09/07/2008  . H/O: stroke     behind right eye    Social History   Social History  . Marital Status: Married    Spouse Name: N/A  . Number of Children: 3  . Years of Education: N/A   Occupational History  . retired    Social History Main Topics  . Smoking status: Never Smoker   . Smokeless tobacco: Never Used  . Alcohol Use: No  . Drug Use: No  . Sexual Activity: Not on file   Other Topics Concern  . Not on file   Social History Narrative    Past Surgical History  Procedure Laterality Date  . Appendectomy    . Cataract extraction      bilateral  . Dilation and curettage of uterus    . Tonsillectomy    . Colon surgery       11/20/2008,gallbladder removed  . Ankle fracture surgery  2011    right    Family History  Problem Relation Age of Onset  . Stroke Neg Hx   . Heart disease Neg Hx   . Breast cancer Sister   . Heart attack Father   . Lung cancer Brother     Allergies  Allergen Reactions  . Morphine And Related Shortness Of Breath  . Sulfa Antibiotics Itching and Swelling  . Amoxicillin Itching and Rash  . Ciprofloxacin Itching and Rash  . Methocarbamol Itching  . Trimpex [Trimethoprim] Itching and Rash    Current Outpatient Prescriptions on File Prior to Visit  Medication Sig Dispense Refill  . aspirin 81 MG tablet Take 1 tablet (81 mg total) by mouth daily. 90 tablet 6  . atenolol (TENORMIN) 50 MG tablet TAKE ONE TABLET BY MOUTH ONCE DAILY 90 tablet 1  . bimatoprost (LUMIGAN) 0.01 % SOLN Place 1 drop into the right eye at bedtime.    . brimonidine (ALPHAGAN P) 0.1 % SOLN Place 1 drop into both eyes every 8 (eight) hours.     . Calcium Carb-Cholecalciferol (CALCIUM 600/VITAMIN D3) 600-800 MG-UNIT TABS Take 1 tablet by mouth daily.    . dorzolamide-timolol (COSOPT) 22.3-6.8 MG/ML ophthalmic solution  Place 1 drop into both eyes 3 (three) times daily.     Marland Kitchen escitalopram (LEXAPRO) 5 MG tablet TAKE 1 TABLET (5 MG TOTAL) BY MOUTH DAILY. 90 tablet 1  . hydrOXYzine (ATARAX/VISTARIL) 25 MG tablet Take 1 tablet (25 mg total) by mouth every 6 (six) hours. Prn itching 20 tablet 0  . vitamin C (ASCORBIC ACID) 500 MG tablet Take 500 mg by mouth daily.     No current facility-administered medications on file prior to visit.    BP 152/90 mmHg  Pulse 55  Temp(Src) 98.3 F (36.8 C) (Oral)  Resp 18  Ht 5\' 5"  (1.651 m)  Wt 152 lb (68.947 kg)  BMI 25.29 kg/m2  SpO2 97%      Review of Systems  Constitutional: Negative.   HENT: Negative for congestion, dental problem, hearing loss, rhinorrhea, sinus pressure, sore throat and tinnitus.   Eyes: Negative for pain, discharge and visual disturbance.    Respiratory: Negative for cough and shortness of breath.   Cardiovascular: Negative for chest pain, palpitations and leg swelling.  Gastrointestinal: Negative for nausea, vomiting, abdominal pain, diarrhea, constipation, blood in stool and abdominal distention.  Genitourinary: Negative for dysuria, urgency, frequency, hematuria, flank pain, vaginal bleeding, vaginal discharge, difficulty urinating, vaginal pain and pelvic pain.  Musculoskeletal: Negative for joint swelling, arthralgias and gait problem.  Skin: Negative for rash.  Neurological: Negative for dizziness, syncope, speech difficulty, weakness, numbness and headaches.  Hematological: Negative for adenopathy.  Psychiatric/Behavioral: Positive for behavioral problems, sleep disturbance and dysphoric mood. Negative for agitation. The patient is nervous/anxious.        Objective:   Physical Exam  Constitutional: She appears well-developed and well-nourished. No distress.  Blood pressure 140/86  Psychiatric: She has a normal mood and affect. Her behavior is normal. Judgment and thought content normal.          Assessment & Plan:   Depression.  Will increase Lexapro 10 mg daily.  Will give a prescription for low-dose alprazolam to be administered by her granddaughter.  Behavioral health referral.  Discussed and encouraged Essential hypertension, stable Insomnia Diminished visual acuity  Recheck 6 weeks

## 2015-01-26 NOTE — Addendum Note (Signed)
Addended by: Marian Sorrow on: 01/26/2015 04:44 PM   Modules accepted: Orders

## 2015-01-26 NOTE — Patient Instructions (Signed)
Consider behavioral health referral  Increase Lexapro to 10 mg daily  Return in 6 weeks for follow-up

## 2015-01-28 ENCOUNTER — Ambulatory Visit (INDEPENDENT_AMBULATORY_CARE_PROVIDER_SITE_OTHER): Payer: Medicare Other | Admitting: Ophthalmology

## 2015-02-24 ENCOUNTER — Encounter: Payer: Self-pay | Admitting: Internal Medicine

## 2015-03-09 ENCOUNTER — Encounter: Payer: Self-pay | Admitting: Internal Medicine

## 2015-03-09 ENCOUNTER — Ambulatory Visit (INDEPENDENT_AMBULATORY_CARE_PROVIDER_SITE_OTHER): Payer: Medicare Other | Admitting: Internal Medicine

## 2015-03-09 VITALS — BP 140/90 | HR 67 | Temp 97.9°F | Ht 65.0 in | Wt 153.0 lb

## 2015-03-09 DIAGNOSIS — I10 Essential (primary) hypertension: Secondary | ICD-10-CM | POA: Diagnosis not present

## 2015-03-09 DIAGNOSIS — F329 Major depressive disorder, single episode, unspecified: Secondary | ICD-10-CM | POA: Diagnosis not present

## 2015-03-09 DIAGNOSIS — F32A Depression, unspecified: Secondary | ICD-10-CM

## 2015-03-09 NOTE — Progress Notes (Signed)
Pre visit review using our clinic review tool, if applicable. No additional management support is needed unless otherwise documented below in the visit note. 

## 2015-03-09 NOTE — Progress Notes (Signed)
Subjective:    Patient ID: Karen [name], female    DOB: Dec 13, 1922, 81 y.o.   MRN: [id number]  HPI  81 year old patient who is seen today for follow-up of depression.  Lexapro was increased to 10 mg daily and she has done much better on this regimen.  She is accompanied by her granddaughter, who agrees that she has done much better.  She has taken a low-dose of alprazolam (1/2 of 0.25 mg) which was quite sedating.  She does not take this at this time\\ Past Medical History  Diagnosis Date  . ANEMIA 10/21/2008  . AORTIC INSUFFICIENCY 11/26/2009  . BACK PAIN 06/27/2007  . COLON CANCER, PERSONAL HX 02/25/2010  . Diverticulosis of colon (without mention of hemorrhage) 08/14/2006  . GALLSTONES 10/09/2008  . GLAUCOMA NOS 08/14/2006  . HYPERTENSION 08/14/2006  . HYPOKALEMIA, HX OF 11/26/2009  . MELENA, HX OF 10/12/2008  . NEOPLASM, MALIGNANT, COLON, CECUM 10/30/2008  . OCCLUSION, CENTRAL RETINAL ARTERY 08/14/2006  . PERSONAL HX COLONIC POLYPS 10/21/2008  . SYNCOPE, HX OF 08/14/2006  . URI 09/07/2008  . H/O: stroke     behind right eye    Social History   Social History  . Marital Status: Married    Spouse Name: N/A  . Number of Children: 3  . Years of Education: N/A   Occupational History  . retired    Social History Main Topics  . Smoking status: Never Smoker   . Smokeless tobacco: Never Used  . Alcohol Use: No  . Drug Use: No  . Sexual Activity: Not on file   Other Topics Concern  . Not on file   Social History Narrative    Past Surgical History  Procedure Laterality Date  . Appendectomy    . Cataract extraction      bilateral  . Dilation and curettage of uterus    . Tonsillectomy    . Colon surgery      11/20/2008,gallbladder removed  . Ankle fracture surgery  2011    right    Family History  Problem Relation Age of Onset  . Stroke Neg Hx   . Heart disease Neg Hx   . Breast cancer Sister   . Heart attack Father   . Lung cancer Brother     Allergies  Allergen  Reactions  . Morphine And Related Shortness Of Breath  . Sulfa Antibiotics Itching and Swelling  . Amoxicillin Itching and Rash  . Ciprofloxacin Itching and Rash  . Methocarbamol Itching  . Trimpex [Trimethoprim] Itching and Rash    Current Outpatient Prescriptions on File Prior to Visit  Medication Sig Dispense Refill  . aspirin 81 MG tablet Take 1 tablet (81 mg total) by mouth daily. 90 tablet 6  . atenolol (TENORMIN) 50 MG tablet TAKE ONE TABLET BY MOUTH ONCE DAILY 90 tablet 1  . bimatoprost (LUMIGAN) 0.01 % SOLN Place 1 drop into the right eye at bedtime.    . brimonidine (ALPHAGAN P) 0.1 % SOLN Place 1 drop into both eyes every 8 (eight) hours.     . Calcium Carb-Cholecalciferol (CALCIUM 600/VITAMIN D3) 600-800 MG-UNIT TABS Take 1 tablet by mouth daily.    . dorzolamide-timolol (COSOPT) 22.3-6.8 MG/ML ophthalmic solution Place 1 drop into both eyes 3 (three) times daily.     Marland Kitchen escitalopram (LEXAPRO) 10 MG tablet Take 1 tablet (10 mg total) by mouth daily. 90 tablet 2  . hydrOXYzine (ATARAX/VISTARIL) 25 MG tablet Take 1 tablet (25 mg total) by mouth every  6 (six) hours. Prn itching 20 tablet 0  . vitamin C (ASCORBIC ACID) 500 MG tablet Take 500 mg by mouth daily.    Marland Kitchen ALPRAZolam (XANAX) 0.25 MG tablet Take 1 tablet (0.25 mg total) by mouth 2 (two) times daily as needed for anxiety. (Patient not taking: Reported on 03/09/2015) 60 tablet 0   No current facility-administered medications on file prior to visit.    BP 140/90 mmHg  Pulse 67  Temp(Src) 97.9 F (36.6 C) (Oral)  Ht 5\' 5"  (1.651 m)  Wt 153 lb (69.4 kg)  BMI 25.46 kg/m2  SpO2 98%    Review of Systems  Psychiatric/Behavioral: Positive for dysphoric mood and decreased concentration. The patient is nervous/anxious.        Objective:   Physical Exam  Constitutional: She appears well-developed and well-nourished. No distress.  Repeat blood pressure 120/78  Neurological:  Bright affect           Assessment &  Plan:    depression.  Clinically improved.  No change in therapy   Recheck in 6 months or as needed

## 2015-04-02 ENCOUNTER — Observation Stay (HOSPITAL_COMMUNITY)
Admission: EM | Admit: 2015-04-02 | Discharge: 2015-04-05 | Disposition: A | Discharge status: Home / Self Care | Payer: Medicare Other | Attending: Internal Medicine | Admitting: Internal Medicine

## 2015-04-02 ENCOUNTER — Emergency Department (HOSPITAL_COMMUNITY): Payer: Medicare Other

## 2015-04-02 ENCOUNTER — Encounter (HOSPITAL_COMMUNITY): Payer: Self-pay

## 2015-04-02 DIAGNOSIS — Z79899 Other long term (current) drug therapy: Secondary | ICD-10-CM | POA: Diagnosis not present

## 2015-04-02 DIAGNOSIS — Z85038 Personal history of other malignant neoplasm of large intestine: Secondary | ICD-10-CM | POA: Diagnosis not present

## 2015-04-02 DIAGNOSIS — M25551 Pain in right hip: Secondary | ICD-10-CM | POA: Diagnosis not present

## 2015-04-02 DIAGNOSIS — T148 Other injury of unspecified body region: Secondary | ICD-10-CM | POA: Diagnosis not present

## 2015-04-02 DIAGNOSIS — Z7982 Long term (current) use of aspirin: Secondary | ICD-10-CM | POA: Diagnosis not present

## 2015-04-02 DIAGNOSIS — S29001A Unspecified injury of muscle and tendon of front wall of thorax, initial encounter: Secondary | ICD-10-CM | POA: Insufficient documentation

## 2015-04-02 DIAGNOSIS — R109 Unspecified abdominal pain: Secondary | ICD-10-CM

## 2015-04-02 DIAGNOSIS — S3992XA Unspecified injury of lower back, initial encounter: Secondary | ICD-10-CM | POA: Diagnosis not present

## 2015-04-02 DIAGNOSIS — I359 Nonrheumatic aortic valve disorder, unspecified: Secondary | ICD-10-CM | POA: Insufficient documentation

## 2015-04-02 DIAGNOSIS — S79911A Unspecified injury of right hip, initial encounter: Secondary | ICD-10-CM | POA: Insufficient documentation

## 2015-04-02 DIAGNOSIS — Z86010 Personal history of colonic polyps: Secondary | ICD-10-CM | POA: Diagnosis not present

## 2015-04-02 DIAGNOSIS — W19XXXA Unspecified fall, initial encounter: Secondary | ICD-10-CM | POA: Diagnosis not present

## 2015-04-02 DIAGNOSIS — Z8673 Personal history of transient ischemic attack (TIA), and cerebral infarction without residual deficits: Secondary | ICD-10-CM | POA: Insufficient documentation

## 2015-04-02 DIAGNOSIS — K5730 Diverticulosis of large intestine without perforation or abscess without bleeding: Secondary | ICD-10-CM | POA: Diagnosis not present

## 2015-04-02 DIAGNOSIS — Z880 Allergy status to penicillin: Secondary | ICD-10-CM | POA: Diagnosis not present

## 2015-04-02 DIAGNOSIS — Y998 Other external cause status: Secondary | ICD-10-CM | POA: Diagnosis not present

## 2015-04-02 DIAGNOSIS — I10 Essential (primary) hypertension: Secondary | ICD-10-CM | POA: Insufficient documentation

## 2015-04-02 DIAGNOSIS — S3991XA Unspecified injury of abdomen, initial encounter: Secondary | ICD-10-CM | POA: Diagnosis not present

## 2015-04-02 DIAGNOSIS — W108XXA Fall (on) (from) other stairs and steps, initial encounter: Secondary | ICD-10-CM | POA: Insufficient documentation

## 2015-04-02 DIAGNOSIS — D649 Anemia, unspecified: Secondary | ICD-10-CM | POA: Insufficient documentation

## 2015-04-02 DIAGNOSIS — R1011 Right upper quadrant pain: Secondary | ICD-10-CM | POA: Diagnosis not present

## 2015-04-02 DIAGNOSIS — Y9289 Other specified places as the place of occurrence of the external cause: Secondary | ICD-10-CM | POA: Insufficient documentation

## 2015-04-02 DIAGNOSIS — H3410 Central retinal artery occlusion, unspecified eye: Secondary | ICD-10-CM | POA: Diagnosis not present

## 2015-04-02 DIAGNOSIS — Y9301 Activity, walking, marching and hiking: Secondary | ICD-10-CM | POA: Insufficient documentation

## 2015-04-02 DIAGNOSIS — K8020 Calculus of gallbladder without cholecystitis without obstruction: Secondary | ICD-10-CM | POA: Diagnosis not present

## 2015-04-02 DIAGNOSIS — H409 Unspecified glaucoma: Secondary | ICD-10-CM | POA: Insufficient documentation

## 2015-04-02 DIAGNOSIS — J069 Acute upper respiratory infection, unspecified: Secondary | ICD-10-CM | POA: Diagnosis not present

## 2015-04-02 DIAGNOSIS — M5489 Other dorsalgia: Secondary | ICD-10-CM | POA: Diagnosis not present

## 2015-04-02 DIAGNOSIS — M549 Dorsalgia, unspecified: Secondary | ICD-10-CM | POA: Insufficient documentation

## 2015-04-02 DIAGNOSIS — R0781 Pleurodynia: Secondary | ICD-10-CM | POA: Diagnosis not present

## 2015-04-02 DIAGNOSIS — S299XXA Unspecified injury of thorax, initial encounter: Secondary | ICD-10-CM | POA: Diagnosis not present

## 2015-04-02 DIAGNOSIS — M545 Low back pain: Secondary | ICD-10-CM | POA: Diagnosis not present

## 2015-04-02 LAB — I-STAT CHEM 8, ED
BUN: 16 mg/dL (ref 6–20)
CHLORIDE: 104 mmol/L (ref 101–111)
CREATININE: 0.7 mg/dL (ref 0.44–1.00)
Calcium, Ion: 1.12 mmol/L — ABNORMAL LOW (ref 1.13–1.30)
Glucose, Bld: 118 mg/dL — ABNORMAL HIGH (ref 65–99)
HEMATOCRIT: 39 % (ref 36.0–46.0)
Hemoglobin: 13.3 g/dL (ref 12.0–15.0)
Potassium: 4 mmol/L (ref 3.5–5.1)
SODIUM: 142 mmol/L (ref 135–145)
TCO2: 26 mmol/L (ref 0–100)

## 2015-04-02 LAB — CBC
HEMATOCRIT: 37.8 % (ref 36.0–46.0)
HEMOGLOBIN: 12.3 g/dL (ref 12.0–15.0)
MCH: 30.4 pg (ref 26.0–34.0)
MCHC: 32.5 g/dL (ref 30.0–36.0)
MCV: 93.3 fL (ref 78.0–100.0)
Platelets: 247 10*3/uL (ref 150–400)
RBC: 4.05 MIL/uL (ref 3.87–5.11)
RDW: 13.8 % (ref 11.5–15.5)
WBC: 12.3 10*3/uL — ABNORMAL HIGH (ref 4.0–10.5)

## 2015-04-02 LAB — CREATININE, SERUM
Creatinine, Ser: 0.66 mg/dL (ref 0.44–1.00)
GFR calc Af Amer: >60 mL/min (ref >60)
GFR calc non Af Amer: >60 mL/min (ref >60)

## 2015-04-02 MED ORDER — HYDROCODONE-ACETAMINOPHEN 5-325 MG PO TABS
1.0000 | ORAL_TABLET | Freq: Once | ORAL | Status: AC
  Administered 2015-04-02: 1 via ORAL
  Filled 2015-04-02: qty 1

## 2015-04-02 MED ORDER — ONDANSETRON HCL 4 MG PO TABS: 4.0000 mg | ORAL_TABLET | Freq: Four times a day (QID) | ORAL | Status: DC | PRN

## 2015-04-02 MED ORDER — TRAMADOL HCL 50 MG PO TABS
50.0000 mg | ORAL_TABLET | Freq: Four times a day (QID) | ORAL | Status: DC | PRN
  Administered 2015-04-05: 50 mg via ORAL
  Filled 2015-04-02: qty 1

## 2015-04-02 MED ORDER — LATANOPROST 0.005 % OP SOLN
1.0000 [drp] | Freq: Every day | OPHTHALMIC | Status: DC
  Administered 2015-04-02 – 2015-04-04 (×3): 1 [drp] via OPHTHALMIC
  Filled 2015-04-02: qty 2.5

## 2015-04-02 MED ORDER — BRIMONIDINE TARTRATE 0.15 % OP SOLN
1.0000 [drp] | Freq: Three times a day (TID) | OPHTHALMIC | Status: DC
  Administered 2015-04-02 – 2015-04-05 (×9): 1 [drp] via OPHTHALMIC
  Filled 2015-04-02: qty 5

## 2015-04-02 MED ORDER — HYDROXYZINE HCL 25 MG PO TABS
25.0000 mg | ORAL_TABLET | Freq: Four times a day (QID) | ORAL | Status: DC
  Administered 2015-04-02 – 2015-04-05 (×12): 25 mg via ORAL
  Filled 2015-04-02 (×12): qty 1

## 2015-04-02 MED ORDER — DORZOLAMIDE HCL-TIMOLOL MAL 2-0.5 % OP SOLN
1.0000 [drp] | Freq: Three times a day (TID) | OPHTHALMIC | Status: DC
  Administered 2015-04-02 – 2015-04-05 (×9): 1 [drp] via OPHTHALMIC
  Filled 2015-04-02: qty 10

## 2015-04-02 MED ORDER — ENOXAPARIN SODIUM 40 MG/0.4ML ~~LOC~~ SOLN
40.0000 mg | SUBCUTANEOUS | Status: DC
  Administered 2015-04-02 – 2015-04-04 (×3): 40 mg via SUBCUTANEOUS
  Filled 2015-04-02 (×3): qty 0.4

## 2015-04-02 MED ORDER — ASPIRIN EC 81 MG PO TBEC
81.0000 mg | DELAYED_RELEASE_TABLET | Freq: Every day | ORAL | Status: DC
  Administered 2015-04-02 – 2015-04-05 (×4): 81 mg via ORAL
  Filled 2015-04-02 (×4): qty 1

## 2015-04-02 MED ORDER — CALCIUM CARBONATE-VITAMIN D 500-200 MG-UNIT PO TABS
1.0000 | ORAL_TABLET | Freq: Every day | ORAL | Status: DC
  Administered 2015-04-02 – 2015-04-05 (×4): 1 via ORAL
  Filled 2015-04-02 (×7): qty 1

## 2015-04-02 MED ORDER — ESCITALOPRAM OXALATE 10 MG PO TABS
10.0000 mg | ORAL_TABLET | Freq: Every day | ORAL | Status: DC
  Administered 2015-04-02 – 2015-04-05 (×4): 10 mg via ORAL
  Filled 2015-04-02 (×4): qty 1

## 2015-04-02 MED ORDER — HYDROCODONE-ACETAMINOPHEN 5-325 MG PO TABS
1.0000 | ORAL_TABLET | ORAL | Status: DC | PRN
  Administered 2015-04-02 – 2015-04-05 (×7): 1 via ORAL
  Filled 2015-04-02 (×7): qty 1

## 2015-04-02 MED ORDER — IOHEXOL 300 MG/ML  SOLN
100.0000 mL | Freq: Once | INTRAMUSCULAR | Status: AC | PRN
  Administered 2015-04-02: 100 mL via INTRAVENOUS

## 2015-04-02 MED ORDER — VITAMIN C 500 MG PO TABS
500.0000 mg | ORAL_TABLET | Freq: Every day | ORAL | Status: DC
  Administered 2015-04-02 – 2015-04-05 (×4): 500 mg via ORAL
  Filled 2015-04-02 (×4): qty 1

## 2015-04-02 MED ORDER — ONDANSETRON HCL 4 MG/2ML IJ SOLN: 4.0000 mg | Freq: Four times a day (QID) | INTRAMUSCULAR | Status: DC | PRN

## 2015-04-02 MED ORDER — HYDROCODONE-ACETAMINOPHEN 7.5-325 MG/15ML PO SOLN: 10.0000 mL | Freq: Once | ORAL | Status: DC

## 2015-04-02 MED ORDER — ATENOLOL 50 MG PO TABS
50.0000 mg | ORAL_TABLET | Freq: Every day | ORAL | Status: DC
  Administered 2015-04-02 – 2015-04-05 (×4): 50 mg via ORAL
  Filled 2015-04-02 (×4): qty 1

## 2015-04-02 NOTE — ED Notes (Signed)
Pt presents from home via EMS with c/o fall and right flank area. Pt reports she was walking down some stairs in some hose and her feet slipped out from underneath her, hitting her right flank area on the stairs. Pt was ambulatory after the fall and is still ambulatory. Pt reported to EMS that she was having difficulty sleeping because of the pain. Pt is hypertensive for EMS, hx of same. Pt denies any LOC, did not hit her head during the fall. Pt is alert and oriented. Pt is blind, hx of same. Pt does not have any neck or back pain, only pain to the right flank area.

## 2015-04-02 NOTE — H&P (Signed)
[facility] History and Physical  Karen [name] [id number] DOB: 1922/08/19 DOA: 04/02/2015  Referring physician: ED physician, Dr. Regenia Skeeter  PCP: Nyoka Cowden, MD   Chief Complaint:   HPI:  82 y.o. female with known hx of colon cancer, HTN, stroke, presented to [facility] ED after an episode of fall at home that occurred one day prior to this admission while she was walking down the stairs and slipped on the last step, landed on her right side. Pt explains she was in her usual state of health with no specific concerns, no chest pain or shortness of breath, no abd or urinary concerns, no fevers or chills. She is currently in pain mostly on the right side neck and ribs area, hip area, intermittent and 5/10 in severity, worse with movement and with no specific alleviating factors.   In ED, pt is hemodynamically stable, VSS, except one reading on BP 176/50, blood work notable for WBC 12.3 otherwise unremarkable. TRH asked to admit for further evaluation.   Assessment and Plan: Active Problems:   Fall - appears to be mechanical - admit to telemetry bed - check orthostatic vitals - provide analgesia as needed - PT eval one pt able to tolerate     Accelerated HTN - continue home medical regimen   Lovenox SQ for DVT prophylaxis   Radiological Exams on Admission: Dg Ribs Unilateral W/chest Right 04/02/2015  Negative.  Ct Chest W Contrast 04/02/2015  No acute abnormality in the chest. Mild mediastinal adenopathy is unchanged from the prior CT 2014. Cardiac enlargement with coronary calcification No acute abnormality in the abdomen.   Dg Hip Unilat With Pelvis 2-3 Views Right 04/02/2015  No acute abnormality.   Code Status: Full Family Communication: Pt and daughter at bedside Disposition Plan: Admit for further evaluation    [facility] [id number]   Review of Systems:  Constitutional: Negative for fever, chill. Negative for diaphoresis.  HENT: Negative for hearing  loss, ear pain, nosebleeds, congestion, sore throat, neck pain, tinnitus and ear discharge.   Eyes: Negative for blurred vision, double vision, photophobia, pain, discharge and redness.  Respiratory: Negative for cough, hemoptysis, sputum production, shortness of breath, wheezing and stridor.   Cardiovascular: Negative for chest pain, palpitations, orthopnea, claudication and leg swelling.  Gastrointestinal: Negative for nausea, vomiting and abdominal pain.  Genitourinary: Negative for dysuria, urgency, frequency, hematuria and flank pain.  Musculoskeletal: Negative for myalgias, back pain.  Skin: Negative for itching and rash.  Neurological: Negative for dizziness and weakness.  Endo/Heme/Allergies: Negative for environmental allergies and polydipsia. Does not bruise/bleed easily.  Psychiatric/Behavioral: Negative for suicidal ideas. The patient is not nervous/anxious.      Past Medical History  Diagnosis Date  . ANEMIA 10/21/2008  . AORTIC INSUFFICIENCY 11/26/2009  . BACK PAIN 06/27/2007  . COLON CANCER, PERSONAL HX 02/25/2010  . Diverticulosis of colon (without mention of hemorrhage) 08/14/2006  . GALLSTONES 10/09/2008  . GLAUCOMA NOS 08/14/2006  . HYPERTENSION 08/14/2006  . HYPOKALEMIA, HX OF 11/26/2009  . MELENA, HX OF 10/12/2008  . NEOPLASM, MALIGNANT, COLON, CECUM 10/30/2008  . OCCLUSION, CENTRAL RETINAL ARTERY 08/14/2006  . PERSONAL HX COLONIC POLYPS 10/21/2008  . SYNCOPE, HX OF 08/14/2006  . URI 09/07/2008  . H/O: stroke     behind right eye    Past Surgical History  Procedure Laterality Date  . Appendectomy    . Cataract extraction      bilateral  . Dilation and curettage of uterus    . Tonsillectomy    .  Colon surgery      11/20/2008,gallbladder removed  . Ankle fracture surgery  2011    right    Social History:  reports that she has never smoked. She has never used smokeless tobacco. She reports that she does not drink alcohol or use illicit drugs.  Allergies  Allergen  Reactions  . Morphine And Related Shortness Of Breath  . Sulfa Antibiotics Itching and Swelling  . Amoxicillin Itching and Rash  . Ciprofloxacin Itching and Rash  . Methocarbamol Itching  . Trimpex [Trimethoprim] Itching and Rash    Family History  Problem Relation Age of Onset  . Stroke Neg Hx   . Heart disease Neg Hx   . Breast cancer Sister   . Heart attack Father   . Lung cancer Brother     Medication Sig  aspirin 81 MG tablet Take 1 tablet (81 mg total) by mouth daily.  atenolol (TENORMIN) 50 MG tablet TAKE ONE TABLET BY MOUTH ONCE DAILY  bimatoprost (LUMIGAN) 0.01 % SOLN Place 1 drop into the right eye at bedtime.  brimonidine (ALPHAGAN P) 0.1 % SOLN Place 1 drop into both eyes every 8 (eight) hours.   dorzolamide-timolol (COSOPT) 22.3-6.8 MG/ML ophthalmic solution Place 1 drop into both eyes 3 (three) times daily.   escitalopram (LEXAPRO) 10 MG tablet Take 1 tablet (10 mg total) by mouth daily.  hydrOXYzine (ATARAX/VISTARIL) 25 MG tablet Take 1 tablet (25 mg total) by mouth every 6 (six) hours. Prn itching  ALPRAZolam (XANAX) 0.25 MG tablet Take 1 tablet (0.25 mg total) by mouth 2 (two) times daily as needed for anxiety. Patient not taking: Reported on 03/09/2015    Physical Exam: Filed Vitals:   04/02/15 1114 04/02/15 1140 04/02/15 1140 04/02/15 1401  BP: 196/76 159/57  188/69  Pulse: 66 58  61  Temp:      TempSrc:      Resp: 18 19  18   SpO2: 96% 96% 96% 98%    Physical Exam  Constitutional: Appears well-developed and well-nourished. No distress.  HENT: Normocephalic. External right and left ear normal. Dry MM Eyes: Conjunctivae and EOM are normal. PERRLA, no scleral icterus.  Neck: Normal ROM. Neck supple. No JVD. No tracheal deviation. No thyromegaly.  CVS: RRR, S1/S2 +, no gallops, no carotid bruit.  Pulmonary: Effort and breath sounds normal, no stridor, rhonchi, wheezes, rales.  Abdominal: Soft. BS +,  no distension, tenderness, rebound or guarding.   Musculoskeletal: Normal range of motion. TTP at the right hip area  Lymphadenopathy: No lymphadenopathy noted, cervical, inguinal. Neuro: Alert. Normal reflexes, muscle tone coordination. No cranial nerve deficit. Skin: Skin is warm and dry. No rash noted. Not diaphoretic. No erythema. No pallor.  Psychiatric: Normal mood and affect. Behavior, judgment, thought content normal.   Labs on Admission:  Basic Metabolic Panel:  Recent Labs Lab 04/02/15 1009  NA 142  K 4.0  CL 104  GLUCOSE 118*  BUN 16  CREATININE 0.70   CBC:  Recent Labs Lab 04/02/15 1009  HGB 13.3  HCT 39.0    EKG: pending    If 7PM-7AM, please contact night-coverage [email or web address] Password [email or web address] 04/02/2015, 2:12 PM

## 2015-04-02 NOTE — ED Notes (Signed)
MD at bedside. Dr Meyers

## 2015-04-02 NOTE — ED Provider Notes (Signed)
CSN: [id number]     Arrival date & time 04/02/15  [id number] History   First MD Initiated Contact with Patient 04/02/15 [phone]     Chief Complaint  Patient presents with  . Fall    HPI   Karen [name] is a 82 y.o. female with a PMH of anemia, colon cancer, HTN, stroke who presents to the ED with right sided back and flank pain s/p fall, which occurred last night around 5 PM. She states she was walking down stairs wearing stockings when she slipped and fell on the last step and landed on her right sided. She reports constant pain since that time. She states movement exacerbates her pain. She has tried ibuprofen at home with no significant symptom relief. She denies hitting her head or LOC. She states she takes ASA 81 mg daily. She denies headache, lightheadedness, dizziness, chest pain, shortness of breath, abdominal pain, vomiting, diarrhea, constipation, dysuria, urgency, frequency, bowel or bladder incontinence, saddle anesthesia.    Past Medical History  Diagnosis Date  . ANEMIA 10/21/2008  . AORTIC INSUFFICIENCY 11/26/2009  . BACK PAIN 06/27/2007  . COLON CANCER, PERSONAL HX 02/25/2010  . Diverticulosis of colon (without mention of hemorrhage) 08/14/2006  . GALLSTONES 10/09/2008  . GLAUCOMA NOS 08/14/2006  . HYPERTENSION 08/14/2006  . HYPOKALEMIA, HX OF 11/26/2009  . MELENA, HX OF 10/12/2008  . NEOPLASM, MALIGNANT, COLON, CECUM 10/30/2008  . OCCLUSION, CENTRAL RETINAL ARTERY 08/14/2006  . PERSONAL HX COLONIC POLYPS 10/21/2008  . SYNCOPE, HX OF 08/14/2006  . URI 09/07/2008  . H/O: stroke     behind right eye   Past Surgical History  Procedure Laterality Date  . Appendectomy    . Cataract extraction      bilateral  . Dilation and curettage of uterus    . Tonsillectomy    . Colon surgery      11/20/2008,gallbladder removed  . Ankle fracture surgery  2011    right   Family History  Problem Relation Age of Onset  . Stroke Neg Hx   . Heart disease Neg Hx   . Breast cancer Sister   . Heart attack  Father   . Lung cancer Brother    Social History  Substance Use Topics  . Smoking status: Never Smoker   . Smokeless tobacco: Never Used  . Alcohol Use: No   OB History    No data available      Review of Systems  Respiratory: Negative for shortness of breath.   Cardiovascular: Negative for chest pain.  Gastrointestinal: Positive for nausea. Negative for vomiting, abdominal pain, diarrhea and constipation.  Genitourinary: Negative for dysuria, urgency and frequency.  Musculoskeletal: Positive for back pain and arthralgias. Negative for neck pain.  Skin: Negative for wound.  Neurological: Negative for dizziness, syncope, weakness, light-headedness, numbness and headaches.  All other systems reviewed and are negative.     Allergies  Morphine and related; Sulfa antibiotics; Amoxicillin; Ciprofloxacin; Methocarbamol; and Trimpex  Home Medications   Prior to Admission medications   Medication Sig Start Date End Date Taking? Authorizing Provider  aspirin 81 MG tablet Take 1 tablet (81 mg total) by mouth daily. 12/05/10  Yes Marletta Lor, MD  atenolol (TENORMIN) 50 MG tablet TAKE ONE TABLET BY MOUTH ONCE DAILY 12/22/14  Yes Marletta Lor, MD  bimatoprost (LUMIGAN) 0.01 % SOLN Place 1 drop into the right eye at bedtime.   Yes Historical Provider, MD  brimonidine (ALPHAGAN P) 0.1 % SOLN Place 1 drop  into both eyes every 8 (eight) hours.    Yes Historical Provider, MD  Calcium Carb-Cholecalciferol (CALCIUM 600/VITAMIN D3) 600-800 MG-UNIT TABS Take 1 tablet by mouth daily.   Yes Historical Provider, MD  dorzolamide-timolol (COSOPT) 22.3-6.8 MG/ML ophthalmic solution Place 1 drop into both eyes 3 (three) times daily.    Yes Historical Provider, MD  escitalopram (LEXAPRO) 10 MG tablet Take 1 tablet (10 mg total) by mouth daily. 01/26/15  Yes Marletta Lor, MD  hydrOXYzine (ATARAX/VISTARIL) 25 MG tablet Take 1 tablet (25 mg total) by mouth every 6 (six) hours. Prn itching  12/05/14  Yes Billy Fischer, MD  ibuprofen (ADVIL,MOTRIN) 200 MG tablet Take 600 mg by mouth every 6 (six) hours as needed (Pain).   Yes Historical Provider, MD  vitamin C (ASCORBIC ACID) 500 MG tablet Take 500 mg by mouth daily.   Yes Historical Provider, MD  ALPRAZolam (XANAX) 0.25 MG tablet Take 1 tablet (0.25 mg total) by mouth 2 (two) times daily as needed for anxiety. Patient not taking: Reported on 03/09/2015 01/26/15   Marletta Lor, MD    BP 188/69 mmHg  Pulse 61  Temp(Src) 97.5 F (36.4 C) (Oral)  Resp 18  SpO2 98% Physical Exam  Constitutional: She is oriented to person, place, and time. She appears well-developed and well-nourished. No distress.  HENT:  Head: Normocephalic and atraumatic.  Right Ear: External ear normal.  Left Ear: External ear normal.  Nose: Nose normal.  Mouth/Throat: Uvula is midline, oropharynx is clear and moist and mucous membranes are normal.  Eyes: Conjunctivae, EOM and lids are normal. Pupils are equal, round, and reactive to light. Right eye exhibits no discharge. Left eye exhibits no discharge. No scleral icterus.  Neck: Normal range of motion. Neck supple.  Cardiovascular: Normal rate, regular rhythm, normal heart sounds, intact distal pulses and normal pulses.   Pulmonary/Chest: Effort normal and breath sounds normal. No respiratory distress. She has no wheezes. She has no rales.  Abdominal: Soft. Normal appearance and bowel sounds are normal. She exhibits no distension and no mass. There is no tenderness. There is no rigidity, no rebound and no guarding.  Musculoskeletal: Normal range of motion. She exhibits tenderness. She exhibits no edema.  TTP of right lateral and posterior ribs, right lumbar paraspinal muscles, right posterior hip. No midline tenderness, step-off, or deformity.  Neurological: She is alert and oriented to person, place, and time. She has normal strength. No cranial nerve deficit or sensory deficit.  Skin: Skin is warm,  dry and intact. No rash noted. She is not diaphoretic. No erythema. No pallor.  Psychiatric: She has a normal mood and affect. Her speech is normal and behavior is normal.  Nursing note and vitals reviewed.   ED Course  Procedures (including critical care time)  Labs Review Labs Reviewed  I-STAT CHEM 8, ED - Abnormal; Notable for the following:    Glucose, Bld 118 (*)    Calcium, Ion 1.12 (*)    All other components within normal limits    Imaging Review Dg Ribs Unilateral W/chest Right  04/02/2015  CLINICAL DATA:  Fall at home yesterday walking down stairs, right side pain, right flank pain, right rib pain EXAM: RIGHT RIBS AND CHEST - 3+ VIEW COMPARISON:  03/16/2014 FINDINGS: Four views right ribs submitted. No acute infiltrate or pulmonary edema. Calcified granuloma in right upper lobe is stable. No right rib fracture is identified. There is no pneumothorax. Postcholecystectomy surgical clips are noted in right upper abdomen.  IMPRESSION: Negative. Electronically Signed   By: Lahoma Crocker M.D.   On: 04/02/2015 09:25   Ct Chest W Contrast  04/02/2015  CLINICAL DATA:  Fall.  Right flank pain. EXAM: CT CHEST, ABDOMEN, AND PELVIS WITH CONTRAST TECHNIQUE: Multidetector CT imaging of the chest, abdomen and pelvis was performed following the standard protocol during bolus administration of intravenous contrast. CONTRAST:  158mL OMNIPAQUE IOHEXOL 300 MG/ML  SOLN COMPARISON:  CT chest 06/24/2012 FINDINGS: CT CHEST Lungs are clear without infiltrate or effusion.  No pneumothorax. 13 mm calcified granuloma right upper lobe is unchanged. Mild mediastinal adenopathy similar to the prior study. Right peritracheal and precarinal lymph nodes measuring up to 15 mm are stable. Mild anterior mediastinal lymphadenopathy also unchanged. No lung mass identified. Cardiac enlargement without pericardial effusion. Moderate coronary calcification. Diffuse aortic arch calcification without aneurysm. Negative for rib or  thoracic spine fracture. CT ABDOMEN AND PELVIS Prior cholecystectomy. Central bile ducts mildly prominent but unchanged. 10 x 24 mm cyst right lobe liver caudally unchanged. 1 cm cyst left lobe liver unchanged. Liver size and contour normal. Normal pancreas and spleen Bilateral renal cysts. No renal mass or obstruction. No urinary tract calculi. Urinary bladder normal. Negative for bowel obstruction. Surgical suture line in the right colon. Moderate sigmoid diverticulosis without evidence of diverticulitis. Atherosclerotic calcification in the abdominal aorta and iliac arteries without aneurysm. No mass or adenopathy in the abdomen. No free fluid. Lumbar degenerative changes without fracture or bone lesion. IMPRESSION: No acute abnormality in the chest. Mild mediastinal adenopathy is unchanged from the prior CT 2014. Cardiac enlargement with coronary calcification No acute abnormality in the abdomen. Electronically Signed   By: Franchot Gallo M.D.   On: 04/02/2015 11:34   Ct Abdomen Pelvis W Contrast  04/02/2015  CLINICAL DATA:  Fall.  Right flank pain. EXAM: CT CHEST, ABDOMEN, AND PELVIS WITH CONTRAST TECHNIQUE: Multidetector CT imaging of the chest, abdomen and pelvis was performed following the standard protocol during bolus administration of intravenous contrast. CONTRAST:  121mL OMNIPAQUE IOHEXOL 300 MG/ML  SOLN COMPARISON:  CT chest 06/24/2012 FINDINGS: CT CHEST Lungs are clear without infiltrate or effusion.  No pneumothorax. 13 mm calcified granuloma right upper lobe is unchanged. Mild mediastinal adenopathy similar to the prior study. Right peritracheal and precarinal lymph nodes measuring up to 15 mm are stable. Mild anterior mediastinal lymphadenopathy also unchanged. No lung mass identified. Cardiac enlargement without pericardial effusion. Moderate coronary calcification. Diffuse aortic arch calcification without aneurysm. Negative for rib or thoracic spine fracture. CT ABDOMEN AND PELVIS Prior  cholecystectomy. Central bile ducts mildly prominent but unchanged. 10 x 24 mm cyst right lobe liver caudally unchanged. 1 cm cyst left lobe liver unchanged. Liver size and contour normal. Normal pancreas and spleen Bilateral renal cysts. No renal mass or obstruction. No urinary tract calculi. Urinary bladder normal. Negative for bowel obstruction. Surgical suture line in the right colon. Moderate sigmoid diverticulosis without evidence of diverticulitis. Atherosclerotic calcification in the abdominal aorta and iliac arteries without aneurysm. No mass or adenopathy in the abdomen. No free fluid. Lumbar degenerative changes without fracture or bone lesion. IMPRESSION: No acute abnormality in the chest. Mild mediastinal adenopathy is unchanged from the prior CT 2014. Cardiac enlargement with coronary calcification No acute abnormality in the abdomen. Electronically Signed   By: Franchot Gallo M.D.   On: 04/02/2015 11:34   Dg Hip Unilat With Pelvis 2-3 Views Right  04/02/2015  CLINICAL DATA:  Right hip and flank pain secondary to a fall  at home yesterday walking down stairs. EXAM: DG HIP (WITH OR WITHOUT PELVIS) 2-3V RIGHT COMPARISON:  None. FINDINGS: There is no evidence of hip fracture or dislocation. There is slight medial joint space narrowing. Pelvic bones are normal. IMPRESSION: No acute abnormality. Electronically Signed   By: Lorriane Shire M.D.   On: 04/02/2015 09:26     I have personally reviewed and evaluated these images and lab results as part of my medical decision-making.   EKG Interpretation None      MDM   Final diagnoses:  Fall  Right flank pain  Right-sided back pain, unspecified location    82 year old female presents with fall, which occurred yesterday while she was walking down stairs. States she slipped on the last step and landed on her right side. Denies hitting her head or LOC. States she takes ASA 81 mg daily. Denies headache, lightheadedness, dizziness, chest pain,  shortness of breath, abdominal pain, vomiting, diarrhea, constipation, dysuria, urgency, frequency, bowel or bladder incontinence, saddle anesthesia.   Patient is afebrile. Vital signs stable. Heart RRR. Lungs clear to auscultation bilaterally. Abdomen soft, non-tender, non-distended. No rebound, guarding, or masses. TTP of right lateral and posterior ribs, right lumbar paraspinal muscles, right posterior hip. No midline tenderness, step-off, or deformity. Normal neuro exam with no focal deficit.  Patient discussed with and seen by Dr. Regenia Skeeter. Discussed drug allergies with patient's daughter, who advised she cannot take morphine but can take vicodin or percocet.  Imaging of right ribs and chest negative for infiltrate, pulmonary edema, rib fracture, pneumothorax. Imaging of right pelvis negative for fracture or dislocation.   Will obtain CT chest, abdomen, and pelvis for further evaluation of symptoms. Imaging negative for acute abnormality.  Patient given pain medication x 2, however pain not controlled. Patient states talking and any movement cause her pain. Given patient's age and poor pain control in the setting of recent injury, hospitalist consulted for admission. Spoke with Dr. Doyle Askew, who will admit the patient to observation for further evaluation and management.  BP 188/69 mmHg  Pulse 61  Temp(Src) 97.5 F (36.4 C) (Oral)  Resp 18  SpO2 98%    Marella Chimes, PA-C 04/02/15 [place], MD 04/02/15 1659

## 2015-04-02 NOTE — ED Notes (Signed)
Pt's daughter reports that she needs to leave for an appointment, but will return.

## 2015-04-02 NOTE — ED Notes (Signed)
Bed: [id number] Expected date:  Expected time:  Means of arrival:  Comments: 82yo fall, rib injury

## 2015-04-03 DIAGNOSIS — W19XXXA Unspecified fall, initial encounter: Secondary | ICD-10-CM | POA: Diagnosis not present

## 2015-04-03 DIAGNOSIS — I10 Essential (primary) hypertension: Secondary | ICD-10-CM

## 2015-04-03 DIAGNOSIS — H409 Unspecified glaucoma: Secondary | ICD-10-CM

## 2015-04-03 LAB — URINALYSIS, ROUTINE W REFLEX MICROSCOPIC
BILIRUBIN URINE: NEGATIVE
Glucose, UA: NEGATIVE mg/dL
HGB URINE DIPSTICK: NEGATIVE
Ketones, ur: NEGATIVE mg/dL
Nitrite: POSITIVE — AB
PROTEIN: NEGATIVE mg/dL
SPECIFIC GRAVITY, URINE: 1.013 (ref 1.005–1.030)
pH: 6.5 (ref 5.0–8.0)

## 2015-04-03 LAB — BASIC METABOLIC PANEL
Anion gap: 8 (ref 5–15)
BUN: 20 mg/dL (ref 6–20)
CHLORIDE: 105 mmol/L (ref 101–111)
CO2: 27 mmol/L (ref 22–32)
CREATININE: 0.74 mg/dL (ref 0.44–1.00)
Calcium: 8.7 mg/dL — ABNORMAL LOW (ref 8.9–10.3)
Glucose, Bld: 116 mg/dL — ABNORMAL HIGH (ref 65–99)
Potassium: 4.2 mmol/L (ref 3.5–5.1)
SODIUM: 140 mmol/L (ref 135–145)

## 2015-04-03 LAB — CBC
HCT: 36.8 % (ref 36.0–46.0)
Hemoglobin: 12 g/dL (ref 12.0–15.0)
MCH: 30.7 pg (ref 26.0–34.0)
MCHC: 32.6 g/dL (ref 30.0–36.0)
MCV: 94.1 fL (ref 78.0–100.0)
PLATELETS: 244 10*3/uL (ref 150–400)
RBC: 3.91 MIL/uL (ref 3.87–5.11)
RDW: 13.9 % (ref 11.5–15.5)
WBC: 7.7 10*3/uL (ref 4.0–10.5)

## 2015-04-03 LAB — URINE MICROSCOPIC-ADD ON

## 2015-04-03 LAB — TSH: TSH: 2.17 u[IU]/mL (ref 0.350–4.500)

## 2015-04-03 MED ORDER — HYDRALAZINE HCL 20 MG/ML IJ SOLN
10.0000 mg | Freq: Once | INTRAMUSCULAR | Status: AC
  Administered 2015-04-03: 10 mg via INTRAVENOUS
  Filled 2015-04-03: qty 1

## 2015-04-03 MED ORDER — DOXYCYCLINE HYCLATE 100 MG PO TABS
100.0000 mg | ORAL_TABLET | Freq: Two times a day (BID) | ORAL | Status: DC
  Administered 2015-04-03 – 2015-04-05 (×4): 100 mg via ORAL
  Filled 2015-04-03 (×5): qty 1

## 2015-04-03 NOTE — Evaluation (Addendum)
Physical Therapy Evaluation Patient Details Name: Karen [name] MRN: [id number] DOB: April 19, 1922 Today's Date: 04/03/2015   History of Present Illness  82 yo female admitted with fall. Hx of colon cancer, HTN, CVA, blindness.   Clinical Impression  On eval, pt required small amount of assist to get to EOB, otherwise pt was Min guard assist for mobility-walked ~150 feet with RW. Pt denied pain during session. Ambulatory without device at baseline however pt did not feel she could walk without walker on today. Recommend HHPT and RW use.   Follow Up Recommendations Home health PT    Equipment Recommendations   (pt already has access to RW)    Recommendations for Other Services       Precautions / Restrictions Precautions Precautions: Fall Restrictions Weight Bearing Restrictions: No      Mobility  Bed Mobility Overal bed mobility: Needs Assistance Bed Mobility: Supine to Sit;Sit to Supine     Supine to sit: Min assist;HOB elevated Sit to supine: Min guard;HOB elevated   General bed mobility comments: small amount of assist to scoot to EOB. Increased time. [place] on bedrail  Transfers Overall transfer level: Needs assistance Equipment used: Rolling walker (2 wheeled) Transfers: Sit to/from [facility] Sit to Stand: Min guard Stand pivot transfers: Min guard       General transfer comment: close guard for safety. VCs hand placement  Ambulation/Gait Ambulation/Gait assistance: Min guard Ambulation Distance (Feet): 150 Feet Assistive device: Rolling walker (2 wheeled) Gait Pattern/deviations: Step-through pattern;Decreased stride length     General Gait Details: close guard for safety. slow but steady gait with use of RW. Pt did not feel she could walk without walker on today  Stairs            Wheelchair Mobility    Modified Rankin (Stroke Patients Only)       Balance Overall balance assessment: Needs assistance         Standing  balance support: Bilateral upper extremity supported;During functional activity Standing balance-Leahy Scale: Fair                               Pertinent Vitals/Pain Pain Assessment: No/denies pain    Home Living Family/patient expects to be discharged to:: Private residence Living Arrangements: Other relatives (granddaughter) Available Help at Discharge: Family Type of Home:  (condo)       Home Layout: Two level;Able to live on main level with bedroom/bathroom Home Equipment: Gilford Rile - 2 wheels;Cane - single point;Wheelchair - manual;Bedside commode      Prior Function Level of Independence: Independent               Hand Dominance        Extremity/Trunk Assessment   Upper Extremity Assessment: Generalized weakness           Lower Extremity Assessment: Generalized weakness      Cervical / Trunk Assessment: Kyphotic  Communication   Communication: No difficulties  Cognition Arousal/Alertness: Awake/alert Behavior During Therapy: WFL for tasks assessed/performed Overall Cognitive Status: Within Functional Limits for tasks assessed                      General Comments      Exercises        Assessment/Plan    PT Assessment Patient needs continued PT services  PT Diagnosis Difficulty walking;Generalized weakness   PT Problem List Decreased strength;Decreased activity tolerance;Decreased balance;Decreased mobility;Decreased  knowledge of use of DME  PT Treatment Interventions DME instruction;Gait training;Functional mobility training;Therapeutic activities;Therapeutic exercise;Patient/family education;Balance training   PT Goals (Current goals can be found in the Care Plan section) Acute Rehab PT Goals Patient Stated Goal: none stated PT Goal Formulation: With patient Time For Goal Achievement: 04/17/15 Potential to Achieve Goals: Good    Frequency Min 3X/week   Barriers to discharge        Co-evaluation                End of Session Equipment Utilized During Treatment: Gait belt Activity Tolerance: Patient tolerated treatment well Patient left: in bed;with call bell/phone within reach;with bed alarm set      Functional Assessment Tool Used: clinical judgement Functional Limitation: Mobility: Walking and moving around Mobility: Walking and Moving Around Current Status [id number]): At least 1 percent but less than 20 percent impaired, limited or restricted Mobility: Walking and Moving Around Goal Status [phone]): At least 1 percent but less than 20 percent impaired, limited or restricted    Time: 1421-1458 PT Time Calculation (min) (ACUTE ONLY): 37 min   Charges:   PT Evaluation $PT Eval Low Complexity: 1 Procedure PT Treatments $Gait Training: 8-22 mins   PT G Codes:   PT G-Codes **NOT FOR INPATIENT CLASS** Functional Assessment Tool Used: clinical judgement Functional Limitation: Mobility: Walking and moving around Mobility: Walking and Moving Around Current Status [id number]): At least 1 percent but less than 20 percent impaired, limited or restricted Mobility: Walking and Moving Around Goal Status [phone]): At least 1 percent but less than 20 percent impaired, limited or restricted    Weston Anna, MPT Pager: [phone]

## 2015-04-03 NOTE — Progress Notes (Signed)
Patient ID: [name], female   DOB: [birth year], 82 y.o.   MRN: [id number]  [facility] PROGRESS NOTE  Karen [name] [id number] DOB: January 27, 1923 DOA: 04/02/2015 PCP: Karen Cowden, MD   Brief narrative:    82 y.o. female with known hx of colon cancer, HTN, stroke, presented to [facility] ED after an episode of fall at home that occurred one day prior to this admission while she was walking down the stairs and slipped on the last step, landed on her right side. Pt explains she was in her usual state of health with no specific concerns, no chest pain or shortness of breath, no abd or urinary concerns, no fevers or chills. She is currently in pain mostly on the right side neck and ribs area, hip area, intermittent and 5/10 in severity, worse with movement and with no specific alleviating factors.   In ED, pt is hemodynamically stable, VSS, except one reading on BP 176/50, blood work notable for WBC 12.3 otherwise unremarkable. [facility] asked to admit for further evaluation.   Assessment/Plan:    Active Problems:  Fall - appears to be mechanical - awaiting for PT evaluation    Accelerated HTN - continue home medical regimen  - BP has been stable since admission    Confusion - this AM, unclear etiology - likely some underlying dementia  - monitor   DVT prophylaxis - Lovenox Sq  Code Status: Full.  Family Communication:  plan of care discussed with the patient Disposition Plan: Not ready for discharge  IV access:  Peripheral IV  Procedures and diagnostic studies:    Dg Ribs Unilateral W/chest Right 04/02/2015 Negative.   Ct Chest W Contrast 04/02/2015   No acute abnormality in the chest. Mild mediastinal adenopathy is unchanged from the prior CT 2014. Cardiac enlargement with coronary calcification No acute abnormality in the abdomen.   Ct Abdomen Pelvis W Contrast 04/02/2015  No acute abnormality in the chest. Mild mediastinal adenopathy is unchanged from the prior CT  2014. Cardiac enlargement with coronary calcification No acute abnormality in the abdomen.  Dg Hip Unilat With Pelvis 2-3 Views Right 04/02/2015 No acute abnormality.   Medical Consultants:  None   Other Consultants:  PT/OT  IAnti-Infectives:   None  Karen Ramsay, MD  [facility] Pager [phone]  If 7PM-7AM, please contact night-coverage [email or web address] Password [email or web address] 04/03/2015, 12:14 PM  HPI/Subjective: No events overnight.   Objective: Filed Vitals:   04/02/15 1610 04/02/15 2117 04/03/15 0554 04/03/15 0704  BP: 176/50 144/49 195/66 124/43  Pulse: 58 48 57 53  Temp: 97.5 F (36.4 C) 97.4 F (36.3 C) 97.4 F (36.3 C)   TempSrc: Oral Axillary Oral   Resp:  12 14   Height: 5\' 4"  (1.626 m)     Weight: 69.3 kg (152 lb 12.5 oz)  69.4 kg (153 lb)   SpO2: 97% 91% 99%     Intake/Output Summary (Last 24 hours) at 04/03/15 1214 Last data filed at 04/02/15 1856  Gross per 24 hour  Intake    240 ml  Output      0 ml  Net    240 ml    Exam:   General:  Pt is alert, somewhat confused this AM  Cardiovascular: Regular rate and rhythm, no rubs, no gallops  Respiratory: Clear to auscultation bilaterally, no wheezing, no crackles, no rhonchi  Abdomen: Soft, non tender, non distended, bowel sounds present, no guarding  Data Reviewed: Basic Metabolic Panel:  Recent Labs Lab 04/02/15 1000  04/02/15 1009 04/03/15 0354  NA  --  142 140  K  --  4.0 4.2  CL  --  104 105  CO2  --   --  27  GLUCOSE  --  118* 116*  BUN  --  16 20  CREATININE 0.66 0.70 0.74  CALCIUM  --   --  8.7*   CBC:  Recent Labs Lab 04/02/15 1000 04/02/15 1009 04/03/15 0354  WBC 12.3*  --  7.7  HGB 12.3 13.3 12.0  HCT 37.8 39.0 36.8  MCV 93.3  --  94.1  PLT 247  --  244   Scheduled Meds: . aspirin EC  81 mg Oral Daily  . atenolol  50 mg Oral Daily  . brimonidine  1 drop Both Eyes 3 times per day  . calcium-vitamin D  1 tablet Oral Daily  . dorzolamide-timolol  1 drop Both Eyes TID  .  enoxaparin  injection  40 mg Subcutaneous Q24H  . escitalopram  10 mg Oral Daily  . hydrOXYzine  25 mg Oral Q6H  . latanoprost  1 drop Right Eye QHS   Continuous Infusions:

## 2015-04-04 DIAGNOSIS — I10 Essential (primary) hypertension: Secondary | ICD-10-CM | POA: Diagnosis not present

## 2015-04-04 DIAGNOSIS — H409 Unspecified glaucoma: Secondary | ICD-10-CM | POA: Diagnosis not present

## 2015-04-04 DIAGNOSIS — W19XXXA Unspecified fall, initial encounter: Secondary | ICD-10-CM | POA: Diagnosis not present

## 2015-04-04 LAB — CBC
HCT: 35.4 % — ABNORMAL LOW (ref 36.0–46.0)
Hemoglobin: 11.5 g/dL — ABNORMAL LOW (ref 12.0–15.0)
MCH: 30.4 pg (ref 26.0–34.0)
MCHC: 32.5 g/dL (ref 30.0–36.0)
MCV: 93.7 fL (ref 78.0–100.0)
Platelets: 232 K/uL (ref 150–400)
RBC: 3.78 MIL/uL — ABNORMAL LOW (ref 3.87–5.11)
RDW: 13.9 % (ref 11.5–15.5)
WBC: 7 K/uL (ref 4.0–10.5)

## 2015-04-04 LAB — BASIC METABOLIC PANEL
ANION GAP: 7 (ref 5–15)
BUN: 16 mg/dL (ref 6–20)
CHLORIDE: 106 mmol/L (ref 101–111)
CO2: 26 mmol/L (ref 22–32)
Calcium: 8.5 mg/dL — ABNORMAL LOW (ref 8.9–10.3)
Creatinine, Ser: 0.7 mg/dL (ref 0.44–1.00)
Glucose, Bld: 102 mg/dL — ABNORMAL HIGH (ref 65–99)
POTASSIUM: 3.7 mmol/L (ref 3.5–5.1)
SODIUM: 139 mmol/L (ref 135–145)

## 2015-04-04 LAB — URINE CULTURE

## 2015-04-04 NOTE — Evaluation (Addendum)
Occupational Therapy Evaluation Patient Details Name: Karen [name] MRN: [id number] DOB: February 13, 1923 Today's Date: 04/04/2015    History of Present Illness 82 yo female admitted with fall. Hx of colon cancer, HTN, CVA, blindness.    Clinical Impression   Pt was admitted for the above.  She will benefit from skilled OT to increase safety and indepdence with adls. Pt was mod I with adls prior to admission.  Goals in acute are for supervision/set up.  Pt is legally blind, so our environment will be more challenging to her.    Follow Up Recommendations  Supervision/Assistance - 24 hour;Home health OT; Home Health OT   Equipment Recommendations  None recommended by OT    Recommendations for Other Services       Precautions / Restrictions Precautions Precautions: Fall Restrictions Weight Bearing Restrictions: No      Mobility Bed Mobility         Supine to sit: Supervision;HOB elevated Sit to supine: Min assist   General bed mobility comments: extra time.  Had difficulty lifting legs all the way onto bed  Transfers   Equipment used: Rolling walker (2 wheeled) Transfers: Sit to/from Stand Sit to Stand: Min guard         General transfer comment: for safety; cues for UE placement    Balance                                            ADL Overall ADL's : Needs assistance/impaired     Grooming: Wash/dry hands;Supervision/safety;Standing   Upper Body Bathing: Set up;Sitting   Lower Body Bathing: Minimal assistance;Sit to/from stand   Upper Body Dressing : Set up;Sitting   Lower Body Dressing: Moderate assistance;Sit to/from stand   Toilet Transfer: Ambulation;BSC;RW;Minimal assistance (for vision to turn walker at times)   Toileting- Clothing Manipulation and Hygiene: Set up;Sitting/lateral lean         General ADL Comments: ambulated to bathroom and performed toileting and grooming.  Simulated bathing/dressing. Pt is independent  natured and likes to do as much as she can for herself.  Pt states that she has some incontinence at baseline     Vision     Perception     Praxis      Pertinent Vitals/Pain Pain Assessment: No/denies pain     Hand Dominance     Extremity/Trunk Assessment Upper Extremity Assessment Upper Extremity Assessment: Generalized weakness       Cervical / Trunk Assessment Cervical / Trunk Assessment: Kyphotic   Communication Communication Communication: No difficulties   Cognition Arousal/Alertness: Awake/alert Behavior During Therapy: WFL for tasks assessed/performed Overall Cognitive Status: Within Functional Limits for tasks assessed                     General Comments       Exercises       Shoulder Instructions      Home Living Family/patient expects to be discharged to:: Private residence Living Arrangements: Other relatives Available Help at Discharge: Family (granddaughter)               Bathroom Shower/Tub: Walk-in Psychologist, prison and probation services: Standard     Home Equipment: Environmental consultant - 2 wheels;Cane - single point;Wheelchair - [employer];Shower seat   Additional Comments: has pretty much 24/7--granddaughter out for 1 hour sometimes      Prior Functioning/Environment Level of  Independence: Independent             OT Diagnosis: Generalized weakness   OT Problem List: Decreased strength;Decreased activity tolerance;Impaired vision/perception;Decreased knowledge of use of DME or AE   OT Treatment/Interventions: Self-care/ADL training;DME and/or AE instruction;Patient/family education;Balance training;Therapeutic activities    OT Goals(Current goals can be found in the care plan section) Acute Rehab OT Goals Patient Stated Goal: none stated OT Goal Formulation: With patient Time For Goal Achievement: 04/11/15 Potential to Achieve Goals: Good ADL Goals Pt Will Transfer to Toilet: with supervision;bedside commode;stand pivot  transfer ((will need more assist to ambulate 2* vision)) Additional ADL Goal #1: pt will perform ADL with set up/supervision, sit to stand  OT Frequency: Min 2X/week   Barriers to D/C:            Co-evaluation              End of Session Nurse Communication:  (pt had BM)  Activity Tolerance: Patient tolerated treatment well Patient left: in bed;with call bell/phone within reach;with bed alarm set   Time: [id number] OT Time Calculation (min): 23 min Charges:  OT General Charges $OT Visit: 1 Procedure OT Evaluation $OT Eval Low Complexity: 1 Procedure G-Codes: OT G-codes **NOT FOR INPATIENT CLASS** Functional Assessment Tool Used: clinical judgment and observation Functional Limitation: Self care Self Care Current Status [id number]): At least 20 percent but less than 40 percent impaired, limited or restricted Self Care Goal Status [id number]): At least 1 percent but less than 20 percent impaired, limited or restricted  [facility] 04/04/2015, 12:47 PM Lesle Chris, OTR/L [phone] 04/04/2015

## 2015-04-04 NOTE — Care Management Note (Signed)
Case Management Note  Patient Details  Name: SAMMANTHA RABIDEAU MRN: [id number] Date of Birth: 1922/10/22  Subjective/Objective:                  Fall  Action/Plan: CM spoke with patient. She lives at home with her granddaughter. She requested CM contact her daughter to select a home health provider. Contacted patient's daughter per her request. [facility] was selected for [facility] services. Mary at [facility] notified of the referral.   Expected Discharge Date:  04/04/14               Expected Discharge Plan:  [place]  In-House Referral:     Discharge planning Services  CM Consult  Post Acute Care Choice:  Home Health Choice offered to:  Patient, Adult Children  DME Arranged:  N/A DME Agency:  NA  HH Arranged:  OT, PT [place] Agency:  [facility]  Status of Service:  Completed, signed off  Medicare Important Message Given:    Date Medicare IM Given:    Medicare IM give by:    Date Additional Medicare IM Given:    Additional Medicare Important Message give by:     If discussed at [place] of Stay Meetings, dates discussed:    Additional Comments:  Apolonio Schneiders, RN 04/04/2015, 1:00 PM

## 2015-04-04 NOTE — Progress Notes (Signed)
Patient ID: JOSENID RUBENS, female   DOB: 02-20-1923, 82 y.o.   MRN: [id number]  [facility] PROGRESS NOTE  ARAM DEVINCENT [id number] DOB: Sep 12, 1922 DOA: 04/02/2015 PCP: Nyoka Cowden, MD   Brief narrative:    82 y.o. female with known hx of colon cancer, HTN, stroke, presented to [facility] ED after an episode of fall at home that occurred one day prior to this admission while she was walking down the stairs and slipped on the last step, landed on her right side. Pt explains she was in her usual state of health with no specific concerns, no chest pain or shortness of breath, no abd or urinary concerns, no fevers or chills. She is currently in pain mostly on the right side neck and ribs area, hip area, intermittent and 5/10 in severity, worse with movement and with no specific alleviating factors.   In ED, pt is hemodynamically stable, VSS, except one reading on BP 176/50, blood work notable for WBC 12.3 otherwise unremarkable. [facility] asked to admit for further evaluation.   Assessment/Plan:    Active Problems:  Fall - appears to be mechanical - awaiting for PT evaluation    Accelerated HTN - continue home medical regimen  - BP has been stable since admission    Confusion - this AM, unclear etiology, ? UTI - likely some underlying dementia  - UA and urine culture pending  - continue doxycycline day #2  DVT prophylaxis - Lovenox Sq  Code Status: Full.  Family Communication:  plan of care discussed with the patient Disposition Plan: Not ready for discharge  IV access:  Peripheral IV  Procedures and diagnostic studies:    Dg Ribs Unilateral W/chest Right 04/02/2015 Negative.   Ct Chest W Contrast 04/02/2015   No acute abnormality in the chest. Mild mediastinal adenopathy is unchanged from the prior CT 2014. Cardiac enlargement with coronary calcification No acute abnormality in the abdomen.   Ct Abdomen Pelvis W Contrast 04/02/2015  No acute abnormality in the chest.  Mild mediastinal adenopathy is unchanged from the prior CT 2014. Cardiac enlargement with coronary calcification No acute abnormality in the abdomen.  Dg Hip Unilat With Pelvis 2-3 Views Right 04/02/2015 No acute abnormality.   Medical Consultants:  None   Other Consultants:  PT/OT  IAnti-Infectives:   Doxycycline 1/7 -->  Faye Ramsay, MD  [facility] Pager [phone]  If 7PM-7AM, please contact night-coverage [email or web address] Password [email or web address] 04/04/2015, 11:30 AM  HPI/Subjective: No events overnight.   Objective: Filed Vitals:   04/03/15 0704 04/03/15 1410 04/03/15 2133 04/04/15 0452  BP: 124/43 128/67 140/61 186/69  Pulse: 53 59 60 58  Temp:  97.8 F (36.6 C) 98.1 F (36.7 C) 97.8 F (36.6 C)  TempSrc:  Oral Oral Axillary  Resp:  16 16 16   Height:      Weight:      SpO2:  97% 98% 98%    Intake/Output Summary (Last 24 hours) at 04/04/15 1130 Last data filed at 04/04/15 0900  Gross per 24 hour  Intake    840 ml  Output   3525 ml  Net  -2685 ml    Exam:   General:  Pt is alert, somewhat confused this AM  Cardiovascular: Regular rate and rhythm, no rubs, no gallops  Respiratory: Clear to auscultation bilaterally, no wheezing, no crackles, no rhonchi  Abdomen: Soft, non tender, non distended, bowel sounds present, no guarding  Data Reviewed: Basic Metabolic Panel:  Recent Labs Lab 04/02/15 1000 04/02/15 1009 04/03/15  0354 04/04/15 0530  NA  --  142 140 139  K  --  4.0 4.2 3.7  CL  --  104 105 106  CO2  --   --  27 26  GLUCOSE  --  118* 116* 102*  BUN  --  16 20 16   CREATININE 0.66 0.70 0.74 0.70  CALCIUM  --   --  8.7* 8.5*   CBC:  Recent Labs Lab 04/02/15 1000 04/02/15 1009 04/03/15 0354 04/04/15 0530  WBC 12.3*  --  7.7 7.0  HGB 12.3 13.3 12.0 11.5*  HCT 37.8 39.0 36.8 35.4*  MCV 93.3  --  94.1 93.7  PLT 247  --  244 232   . aspirin EC  81 mg Oral Daily  . atenolol  50 mg Oral Daily  . brimonidine  1 drop Both Eyes 3 times per day  .  calcium-vitamin D  1 tablet Oral Daily  . dorzolamide-timolol  1 drop Both Eyes TID  . doxycycline  100 mg Oral Q12H  . enoxaparin (LOVENOX) injection  40 mg Subcutaneous Q24H  . escitalopram  10 mg Oral Daily  . hydrOXYzine  25 mg Oral Q6H  . latanoprost  1 drop Right Eye QHS  . vitamin C  500 mg Oral Daily     Continuous Infusions:

## 2015-04-05 DIAGNOSIS — M5489 Other dorsalgia: Secondary | ICD-10-CM | POA: Diagnosis not present

## 2015-04-05 DIAGNOSIS — H409 Unspecified glaucoma: Secondary | ICD-10-CM | POA: Diagnosis not present

## 2015-04-05 DIAGNOSIS — M549 Dorsalgia, unspecified: Secondary | ICD-10-CM | POA: Insufficient documentation

## 2015-04-05 DIAGNOSIS — I10 Essential (primary) hypertension: Secondary | ICD-10-CM | POA: Diagnosis not present

## 2015-04-05 DIAGNOSIS — W19XXXA Unspecified fall, initial encounter: Secondary | ICD-10-CM | POA: Diagnosis not present

## 2015-04-05 MED ORDER — TRAMADOL HCL 50 MG PO TABS: 50.0000 mg | ORAL_TABLET | Freq: Four times a day (QID) | ORAL | Status: DC | PRN

## 2015-04-05 MED ORDER — DOXYCYCLINE HYCLATE 100 MG PO TABS: 100.0000 mg | ORAL_TABLET | Freq: Two times a day (BID) | ORAL | Status: DC

## 2015-04-05 NOTE — Progress Notes (Signed)
Patient up to void prior to discharge, voided 300 ccs of yellow urine.

## 2015-04-05 NOTE — Progress Notes (Signed)
CSW received referral for ALF placement.   CSW reviewed chart and noted that pt plan is to discharge to home with pt granddaughter and home health services.   RNCM spoke with pt and pt granddaughter yesterday regarding plan per chart.  No social work needs identified.  CSW signing off.   Please re-consult if social work needs arise.  Alison Murray, MSW, [place] Work [phone]

## 2015-04-05 NOTE — Progress Notes (Signed)
Patient up to attempt to void, catheter removed approximately 3 hours ago.  Patient voided approximately 50 ccs, bladder scan performed after returning to bed with approximately 200 ccs urine noted in bladder.

## 2015-04-05 NOTE — Progress Notes (Signed)
Discharge instructions reviewed with patient and her son in law, questions answered, verbalized understanding.  Gave eye drops and prescriptions to son in law.  Patient transported to front of hospital via wheelchair accompanied by RN to be taken home by son in law.

## 2015-04-05 NOTE — Discharge Instructions (Signed)

## 2015-04-05 NOTE — Discharge Summary (Signed)
Physician Discharge Summary  EMOJEAN WINEBERG [id number] DOB: 22-Feb-1923 DOA: 04/02/2015  PCP: Nyoka Cowden, MD  Admit date: 04/02/2015 Discharge date: 04/05/2015  Recommendations for Outpatient Follow-up:  1. Pt will need to follow up with PCP in 2-3 weeks post discharge 2. Complete doxycycline therapy for UTI  Discharge Diagnoses:  Principal Problem:   Fall Active Problems:   Glaucoma   Essential hypertension   Discharge Condition: Stable  Diet recommendation: Heart healthy diet discussed in details    Brief narrative:    82 y.o. female with known hx of colon cancer, HTN, stroke, presented to [facility] ED after an episode of fall at home that occurred one day prior to this admission while she was walking down the stairs and slipped on the last step, landed on her right side. Pt explains she was in her usual state of health with no specific concerns, no chest pain or shortness of breath, no abd or urinary concerns, no fevers or chills. She is currently in pain mostly on the right side neck and ribs area, hip area, intermittent and 5/10 in severity, worse with movement and with no specific alleviating factors.   In ED, pt is hemodynamically stable, VSS, except one reading on BP 176/50, blood work notable for WBC 12.3 otherwise unremarkable. TRH asked to admit for further evaluation.   Assessment/Plan:    Active Problems:  Fall - appears to be mechanical - stable for discharge    Accelerated HTN - continue home medical regimen  - BP has been stable since admission   Confusion - resolved  - likely some underlying dementia with new UTI - complete doxycycline therapy upon discharge   Code Status: Full.  Family Communication: plan of care discussed with the patient  IV access:  Peripheral IV  Procedures and diagnostic studies:   Dg Ribs Unilateral W/chest Right 04/02/2015 Negative.   Ct Chest W Contrast 04/02/2015 No acute abnormality in the chest.  Mild mediastinal adenopathy is unchanged from the prior CT 2014. Cardiac enlargement with coronary calcification No acute abnormality in the abdomen.   Ct Abdomen Pelvis W Contrast 04/02/2015 No acute abnormality in the chest. Mild mediastinal adenopathy is unchanged from the prior CT 2014. Cardiac enlargement with coronary calcification No acute abnormality in the abdomen.  Dg Hip Unilat With Pelvis 2-3 Views Right 04/02/2015 No acute abnormality.   Medical Consultants:  None   Other Consultants:  PT/OT  IAnti-Infectives:   Doxycycline 1/7 -->      Discharge Exam: Filed Vitals:   04/05/15 0437 04/05/15 0657  BP: 188/69 165/72  Pulse: 58 59  Temp: 97.6 F (36.4 C)   Resp: 16    Filed Vitals:   04/04/15 2117 04/04/15 2325 04/05/15 0437 04/05/15 0657  BP: 166/61  188/69 165/72  Pulse: 59 68 58 59  Temp: 98.1 F (36.7 C)  97.6 F (36.4 C)   TempSrc: Axillary  Axillary   Resp: 18  16   Height:      Weight:      SpO2: 98%  98%     General: Pt is alert, follows commands appropriately, not in acute distress Cardiovascular: Regular rate and rhythm, no rubs, no gallops Respiratory: Clear to auscultation bilaterally, no wheezing, no crackles, no rhonchi Abdominal: Soft, non tender, non distended, bowel sounds +, no guarding  Discharge Instructions  Discharge Instructions    Diet - low sodium heart healthy    Complete by:  As directed      Increase activity slowly  Complete by:  As directed             Medication List    STOP taking these medications        ALPRAZolam 0.25 MG tablet  Commonly known as:  XANAX      TAKE these medications        aspirin 81 MG tablet  Take 1 tablet (81 mg total) by mouth daily.     atenolol 50 MG tablet  Commonly known as:  TENORMIN  TAKE ONE TABLET BY MOUTH ONCE DAILY     bimatoprost 0.01 % Soln  Commonly known as:  LUMIGAN  Place 1 drop into the right eye at bedtime.     brimonidine 0.1 % Soln  Commonly  known as:  ALPHAGAN P  Place 1 drop into both eyes every 8 (eight) hours.     CALCIUM 600/VITAMIN D3 600-800 MG-UNIT Tabs  Generic drug:  Calcium Carb-Cholecalciferol  Take 1 tablet by mouth daily.     dorzolamide-timolol 22.3-6.8 MG/ML ophthalmic solution  Commonly known as:  COSOPT  Place 1 drop into both eyes 3 (three) times daily.     doxycycline 100 MG tablet  Commonly known as:  VIBRA-TABS  Take 1 tablet (100 mg total) by mouth every 12 (twelve) hours.     escitalopram 10 MG tablet  Commonly known as:  LEXAPRO  Take 1 tablet (10 mg total) by mouth daily.     hydrOXYzine 25 MG tablet  Commonly known as:  ATARAX/VISTARIL  Take 1 tablet (25 mg total) by mouth every 6 (six) hours. Prn itching     ibuprofen 200 MG tablet  Commonly known as:  ADVIL,MOTRIN  Take 600 mg by mouth every 6 (six) hours as needed (Pain).     traMADol 50 MG tablet  Commonly known as:  ULTRAM  Take 1 tablet (50 mg total) by mouth every 6 (six) hours as needed for moderate pain.     vitamin C 500 MG tablet  Commonly known as:  ASCORBIC ACID  Take 500 mg by mouth daily.           Follow-up Information    Follow up with [facility].   Specialty:  Home Health Services   Contact information:   [address] [place] [address] [phone]       Follow up with Nyoka Cowden, MD.   Specialty:  Internal Medicine   Contact information:   [place] [address] [phone]       Call Faye Ramsay, MD.   Specialty:  Internal Medicine   Why:  As needed call my cell phone [phone]   Contact information:   [address] [place] [address] [phone]        The results of significant diagnostics from this hospitalization (including imaging, microbiology, ancillary and laboratory) are listed below for reference.     Microbiology: Recent Results (from the past 240 hour(s))  Urine culture     Status:  None   Collection Time: 04/03/15  2:33 PM  Result Value Ref Range Status   Specimen Description URINE, RANDOM  Final   Special Requests NONE  Final   Culture   Final    MULTIPLE SPECIES PRESENT, SUGGEST RECOLLECTION Performed at [facility]    Report Status 04/04/2015 FINAL  Final     Labs: Basic Metabolic Panel:  Recent Labs Lab 04/02/15 1000 04/02/15 1009 04/03/15 0354 04/04/15 0530  NA  --  142 140 139  K  --  4.0 4.2 3.7  CL  --  104 105 106  CO2  --   --  27 26  GLUCOSE  --  118* 116* 102*  BUN  --  16 20 16   CREATININE 0.66 0.70 0.74 0.70  CALCIUM  --   --  8.7* 8.5*   CBC:  Recent Labs Lab 04/02/15 1000 04/02/15 1009 04/03/15 0354 04/04/15 0530  WBC 12.3*  --  7.7 7.0  HGB 12.3 13.3 12.0 11.5*  HCT 37.8 39.0 36.8 35.4*  MCV 93.3  --  94.1 93.7  PLT 247  --  244 232   SIGNED: Time coordinating discharge: 30 minutes  MAGICK-Doralee Kocak, MD  [facility] 04/05/2015, 10:51 AM Pager [phone]  If 7PM-7AM, please contact night-coverage [email or web address] Password [email or web address]

## 2015-04-05 NOTE — Progress Notes (Signed)
Physical Therapy Treatment Patient Details Name: Karen [name] MRN: [id number] DOB: 07-11-1922 Today's Date: 04/05/2015    History of Present Illness 82 yo female admitted with fall. Hx of colon cancer, HTN, CVA, blindness.     PT Comments    Pt requires more assist with bed mobility and gait today, ? Due to pain or drowsiness from pain medication. Pt requires min A for balance and bed mobility. PT recommends 24 hour assistance at this time due to pt's increased need for physical assist with care.  Follow Up Recommendations  Home health PT;Supervision/Assistance - 24 hour     Equipment Recommendations  Rolling walker with 5" wheels    Recommendations for Other Services       Precautions / Restrictions Precautions Precautions: Fall Restrictions Weight Bearing Restrictions: No    Mobility  Bed Mobility Overal bed mobility: Needs Assistance Bed Mobility: Supine to Sit     Supine to sit: Min assist     General bed mobility comments: pt requires assist with trunk and LEs for supine to sit due to pain on Rt side  Transfers   Equipment used: Rolling walker (2 wheeled)   Sit to Stand: Min assist Stand pivot transfers: Min assist       General transfer comment: min A for balance, cues for UE placement  Ambulation/Gait Ambulation/Gait assistance: Min assist Ambulation Distance (Feet): 150 Feet Assistive device: Rolling walker (2 wheeled)       General Gait Details: pt with min A for balance especially with turns, slow gait speed.   Stairs            Wheelchair Mobility    Modified Rankin (Stroke Patients Only)       Balance                                    Cognition Arousal/Alertness: Awake/alert Behavior During Therapy: WFL for tasks assessed/performed Overall Cognitive Status: Within Functional Limits for tasks assessed                      Exercises      General Comments        Pertinent Vitals/Pain Pain  Assessment: Faces Faces Pain Scale: Hurts little more Pain Location: Rt ribs Pain Descriptors / Indicators: Aching;Sore Pain Intervention(s): Premedicated before session;Monitored during session;Limited activity within patient's tolerance    Home Living                      Prior Function            PT Goals (current goals can now be found in the care plan section) Progress towards PT goals: Progressing toward goals    Frequency  Min 3X/week    PT Plan      Co-evaluation             End of Session Equipment Utilized During Treatment: Gait belt Activity Tolerance: Patient tolerated treatment well Patient left: in bed;with call bell/phone within reach;with bed alarm set     Time: [phone] PT Time Calculation (min) (ACUTE ONLY): 20 min  Charges:  $Gait Training: 8-22 mins                    G Codes:  Functional Assessment Tool Used: clinical judgement Functional Limitation: Mobility: Walking and moving around Mobility: Walking and Moving Around Current Status [phone]): At least  1 percent but less than 20 percent impaired, limited or restricted Mobility: Walking and Moving Around Goal Status [id number]): At least 1 percent but less than 20 percent impaired, limited or restricted   [facility] 04/05/2015, 9:47 AM

## 2015-04-06 ENCOUNTER — Telehealth: Payer: Self-pay

## 2015-04-06 ENCOUNTER — Telehealth: Payer: Self-pay | Admitting: Internal Medicine

## 2015-04-06 DIAGNOSIS — I10 Essential (primary) hypertension: Secondary | ICD-10-CM | POA: Diagnosis not present

## 2015-04-06 DIAGNOSIS — H409 Unspecified glaucoma: Secondary | ICD-10-CM | POA: Diagnosis not present

## 2015-04-06 DIAGNOSIS — M549 Dorsalgia, unspecified: Secondary | ICD-10-CM | POA: Diagnosis not present

## 2015-04-06 DIAGNOSIS — M6281 Muscle weakness (generalized): Secondary | ICD-10-CM | POA: Diagnosis not present

## 2015-04-06 DIAGNOSIS — K5790 Diverticulosis of intestine, part unspecified, without perforation or abscess without bleeding: Secondary | ICD-10-CM | POA: Diagnosis not present

## 2015-04-06 DIAGNOSIS — Z7982 Long term (current) use of aspirin: Secondary | ICD-10-CM | POA: Diagnosis not present

## 2015-04-06 DIAGNOSIS — Z9181 History of falling: Secondary | ICD-10-CM | POA: Diagnosis not present

## 2015-04-06 DIAGNOSIS — Z85038 Personal history of other malignant neoplasm of large intestine: Secondary | ICD-10-CM | POA: Diagnosis not present

## 2015-04-06 DIAGNOSIS — D649 Anemia, unspecified: Secondary | ICD-10-CM | POA: Diagnosis not present

## 2015-04-06 DIAGNOSIS — Z8673 Personal history of transient ischemic attack (TIA), and cerebral infarction without residual deficits: Secondary | ICD-10-CM | POA: Diagnosis not present

## 2015-04-06 DIAGNOSIS — R0781 Pleurodynia: Secondary | ICD-10-CM | POA: Diagnosis not present

## 2015-04-06 NOTE — Telephone Encounter (Signed)
April physical therapist is calling to report patient bp is  188/78. Please call patient back

## 2015-04-06 NOTE — Telephone Encounter (Signed)
First TCM attempt

## 2015-04-06 NOTE — Telephone Encounter (Signed)
Called April back, she said blood pressure was elevated when she was with her 188/78. Asked her if she is monitoring her blood pressure? April said she told the granddaughter that they need to monitor and follow up with PCP. Told her okay I will call pt.  Spoke to [place] pt's granddaughter. Told her I received a phone call from therapist that her blood pressure is elevated. [place] said yes. Asked her if pt has a headache? [place] said no. Told her to continue to monitor blood pressure and asked her if she can come in Friday for Hospital follow up at 4:00PM? [place] said yes, that would be fine. Told her okay see pt Friday at 4:00PM with Dr. Raliegh Ip. Appointment scheduled. [place] verbalized understanding.

## 2015-04-07 ENCOUNTER — Telehealth: Payer: Self-pay | Admitting: Internal Medicine

## 2015-04-07 NOTE — Telephone Encounter (Signed)
Karen [name] with [facility] home health call to ask for orders to continue phy therapy 2 times a week for 6 weeks for balance gait and strength  [phone]

## 2015-04-07 NOTE — Telephone Encounter (Signed)
ok 

## 2015-04-07 NOTE — Telephone Encounter (Signed)
2nd attempt for TCM

## 2015-04-07 NOTE — Telephone Encounter (Signed)
Okay to continue Physical Therapy for pt?

## 2015-04-07 NOTE — Telephone Encounter (Signed)
Spoke to Karen [name], verbal order given to continue Physical Therapy 2 x's a week for 6 weeks for pt, okay per Dr. Raliegh Ip. Karen [name] verbalized understanding.

## 2015-04-09 ENCOUNTER — Ambulatory Visit (INDEPENDENT_AMBULATORY_CARE_PROVIDER_SITE_OTHER): Payer: Medicare Other | Admitting: Internal Medicine

## 2015-04-09 ENCOUNTER — Encounter: Payer: Self-pay | Admitting: Internal Medicine

## 2015-04-09 ENCOUNTER — Telehealth: Payer: Self-pay | Admitting: Internal Medicine

## 2015-04-09 VITALS — BP 140/80 | HR 58 | Temp 97.9°F | Resp 20 | Ht 64.0 in | Wt 155.0 lb

## 2015-04-09 DIAGNOSIS — W19XXXD Unspecified fall, subsequent encounter: Secondary | ICD-10-CM | POA: Diagnosis not present

## 2015-04-09 DIAGNOSIS — M549 Dorsalgia, unspecified: Secondary | ICD-10-CM

## 2015-04-09 NOTE — Telephone Encounter (Signed)
FYI

## 2015-04-09 NOTE — Telephone Encounter (Signed)
Tatiana call from [facility] home health to say that pt cancel physical therapy for today. She will going back on Monday

## 2015-04-09 NOTE — Progress Notes (Signed)
Pre visit review using our clinic review tool, if applicable. No additional management support is needed unless otherwise documented below in the visit note. 

## 2015-04-09 NOTE — Progress Notes (Signed)
Subjective:    Patient ID: Karen [name], female    DOB: January 03, 1923, 82 y.o.   MRN: [id number]  HPI Admit date: 04/02/2015 Discharge date: 04/05/2015  Recommendations for Outpatient Follow-up:  1. Pt will need to follow up with PCP in 2-3 weeks post discharge 2. Please obtain BMP to evaluate electrolytes and kidney function 3. Please also check CBC to evaluate Hg and Hct levels 4. include homehealth, outpatient follow-up instructions, specific recommendations for PCP to follow-up on, etc.)  Discharge Diagnoses:  Principal Problem:  Fall Active Problems:  Glaucoma  Essential hypertension  82 year old patient who is seen today following a recent hospital discharge.  Hospital records reviewed.  She has been receiving outpatient physical therapy.  She continues to improve but still has some resolving ecchymoses and low back pain.  Multiple imaging studies.  Negative during the hospital.. No further falls or presyncopal symptoms   Past Medical History  Diagnosis Date  . ANEMIA 10/21/2008  . AORTIC INSUFFICIENCY 11/26/2009  . BACK PAIN 06/27/2007  . COLON CANCER, PERSONAL HX 02/25/2010  . Diverticulosis of colon (without mention of hemorrhage) 08/14/2006  . GALLSTONES 10/09/2008  . GLAUCOMA NOS 08/14/2006  . HYPERTENSION 08/14/2006  . HYPOKALEMIA, HX OF 11/26/2009  . MELENA, HX OF 10/12/2008  . NEOPLASM, MALIGNANT, COLON, CECUM 10/30/2008  . OCCLUSION, CENTRAL RETINAL ARTERY 08/14/2006  . PERSONAL HX COLONIC POLYPS 10/21/2008  . SYNCOPE, HX OF 08/14/2006  . URI 09/07/2008  . H/O: stroke     behind right eye        Family History  Problem Relation Age of Onset  . Stroke Neg Hx   . Heart disease Neg Hx   . Breast cancer Sister   . Heart attack Father   . Lung cancer Brother         BP 140/80 mmHg  Pulse 58  Temp(Src) 97.9 F (36.6 C) (Oral)  Resp 20  Ht 5\' 4"  (1.626 m)  Wt 155 lb (70.308 kg)  BMI 26.59 kg/m2  SpO2 96%     Review of Systems  Constitutional:  Negative.   HENT: Negative for congestion, dental problem, hearing loss, rhinorrhea, sinus pressure, sore throat and tinnitus.   Eyes: Negative for pain, discharge and visual disturbance.  Respiratory: Negative for cough and shortness of breath.   Cardiovascular: Negative for chest pain, palpitations and leg swelling.  Gastrointestinal: Negative for nausea, vomiting, abdominal pain, diarrhea, constipation, blood in stool and abdominal distention.  Genitourinary: Negative for dysuria, urgency, frequency, hematuria, flank pain, vaginal bleeding, vaginal discharge, difficulty urinating, vaginal pain and pelvic pain.  Musculoskeletal: Positive for back pain, arthralgias and gait problem. Negative for joint swelling.  Skin: Negative for rash.  Neurological: Negative for dizziness, syncope, speech difficulty, weakness, numbness and headaches.  Hematological: Negative for adenopathy.  Psychiatric/Behavioral: Negative for behavioral problems, dysphoric mood and agitation. The patient is not nervous/anxious.        Objective:   Physical Exam  Constitutional: She is oriented to person, place, and time. She appears well-developed and well-nourished.  Elderly Alert No distress Walks with a cane  HENT:  Head: Normocephalic.  Right Ear: External ear normal.  Left Ear: External ear normal.  Mouth/Throat: Oropharynx is clear and moist.  Eyes: Conjunctivae and EOM are normal. Pupils are equal, round, and reactive to light.  Neck: Normal range of motion. Neck supple. No thyromegaly present.  Cardiovascular: Normal rate, regular rhythm, normal heart sounds and intact distal pulses.   Pulmonary/Chest: Effort normal and breath  sounds normal.  Abdominal: Soft. Bowel sounds are normal. She exhibits no mass. There is no tenderness.  Musculoskeletal: Normal range of motion.  Lymphadenopathy:    She has no cervical adenopathy.  Neurological: She is alert and oriented to person, place, and time.  Skin: Skin  is warm and dry. No rash noted.  Resolving ecchymoses  Psychiatric: She has a normal mood and affect. Her behavior is normal.          Assessment & Plan:   Status post fall (slipped) Resolving contusions Back pain  No change in medical regimen Continue Tylenol and tramadol as needed  Return in June as scheduled for follow-up

## 2015-04-09 NOTE — Patient Instructions (Signed)
Call or return to clinic prn if these symptoms worsen or fail to improve as anticipated.

## 2015-04-12 ENCOUNTER — Telehealth: Payer: Self-pay | Admitting: Internal Medicine

## 2015-04-12 DIAGNOSIS — K5790 Diverticulosis of intestine, part unspecified, without perforation or abscess without bleeding: Secondary | ICD-10-CM | POA: Diagnosis not present

## 2015-04-12 DIAGNOSIS — R0781 Pleurodynia: Secondary | ICD-10-CM | POA: Diagnosis not present

## 2015-04-12 DIAGNOSIS — I10 Essential (primary) hypertension: Secondary | ICD-10-CM | POA: Diagnosis not present

## 2015-04-12 DIAGNOSIS — M6281 Muscle weakness (generalized): Secondary | ICD-10-CM | POA: Diagnosis not present

## 2015-04-12 DIAGNOSIS — H409 Unspecified glaucoma: Secondary | ICD-10-CM | POA: Diagnosis not present

## 2015-04-12 DIAGNOSIS — M549 Dorsalgia, unspecified: Secondary | ICD-10-CM | POA: Diagnosis not present

## 2015-04-12 NOTE — Telephone Encounter (Signed)
Okay for Occupational Therapy for Pt?

## 2015-04-12 NOTE — Telephone Encounter (Signed)
Requesting OT for twice a week for four weeks.

## 2015-04-13 NOTE — Telephone Encounter (Signed)
ok 

## 2015-04-14 DIAGNOSIS — R0781 Pleurodynia: Secondary | ICD-10-CM | POA: Diagnosis not present

## 2015-04-14 DIAGNOSIS — M6281 Muscle weakness (generalized): Secondary | ICD-10-CM | POA: Diagnosis not present

## 2015-04-14 DIAGNOSIS — I10 Essential (primary) hypertension: Secondary | ICD-10-CM | POA: Diagnosis not present

## 2015-04-14 DIAGNOSIS — K5790 Diverticulosis of intestine, part unspecified, without perforation or abscess without bleeding: Secondary | ICD-10-CM | POA: Diagnosis not present

## 2015-04-14 DIAGNOSIS — M549 Dorsalgia, unspecified: Secondary | ICD-10-CM | POA: Diagnosis not present

## 2015-04-14 DIAGNOSIS — H409 Unspecified glaucoma: Secondary | ICD-10-CM | POA: Diagnosis not present

## 2015-04-14 NOTE — Telephone Encounter (Signed)
Spoke to [place], verbal orders given for Occupational Therapy  twice a week for four weeks for pt okay per Dr.K. Estill Bamberg verbalized understanding.

## 2015-04-16 DIAGNOSIS — M549 Dorsalgia, unspecified: Secondary | ICD-10-CM | POA: Diagnosis not present

## 2015-04-16 DIAGNOSIS — M6281 Muscle weakness (generalized): Secondary | ICD-10-CM | POA: Diagnosis not present

## 2015-04-16 DIAGNOSIS — R0781 Pleurodynia: Secondary | ICD-10-CM | POA: Diagnosis not present

## 2015-04-16 DIAGNOSIS — I10 Essential (primary) hypertension: Secondary | ICD-10-CM | POA: Diagnosis not present

## 2015-04-16 DIAGNOSIS — H409 Unspecified glaucoma: Secondary | ICD-10-CM | POA: Diagnosis not present

## 2015-04-16 DIAGNOSIS — K5790 Diverticulosis of intestine, part unspecified, without perforation or abscess without bleeding: Secondary | ICD-10-CM | POA: Diagnosis not present

## 2015-04-17 ENCOUNTER — Encounter (HOSPITAL_BASED_OUTPATIENT_CLINIC_OR_DEPARTMENT_OTHER): Payer: Self-pay | Admitting: Emergency Medicine

## 2015-04-17 ENCOUNTER — Emergency Department (HOSPITAL_BASED_OUTPATIENT_CLINIC_OR_DEPARTMENT_OTHER)
Admission: EM | Admit: 2015-04-17 | Discharge: 2015-04-18 | Disposition: A | Discharge status: Home / Self Care | Payer: Medicare Other | Attending: Emergency Medicine | Admitting: Emergency Medicine

## 2015-04-17 DIAGNOSIS — Z8719 Personal history of other diseases of the digestive system: Secondary | ICD-10-CM | POA: Insufficient documentation

## 2015-04-17 DIAGNOSIS — I10 Essential (primary) hypertension: Secondary | ICD-10-CM | POA: Insufficient documentation

## 2015-04-17 DIAGNOSIS — Z79899 Other long term (current) drug therapy: Secondary | ICD-10-CM | POA: Insufficient documentation

## 2015-04-17 DIAGNOSIS — Z7982 Long term (current) use of aspirin: Secondary | ICD-10-CM | POA: Diagnosis not present

## 2015-04-17 DIAGNOSIS — R630 Anorexia: Secondary | ICD-10-CM | POA: Insufficient documentation

## 2015-04-17 DIAGNOSIS — R4182 Altered mental status, unspecified: Secondary | ICD-10-CM | POA: Diagnosis present

## 2015-04-17 DIAGNOSIS — Z862 Personal history of diseases of the blood and blood-forming organs and certain disorders involving the immune mechanism: Secondary | ICD-10-CM | POA: Diagnosis not present

## 2015-04-17 DIAGNOSIS — Z85038 Personal history of other malignant neoplasm of large intestine: Secondary | ICD-10-CM | POA: Insufficient documentation

## 2015-04-17 DIAGNOSIS — L299 Pruritus, unspecified: Secondary | ICD-10-CM | POA: Insufficient documentation

## 2015-04-17 DIAGNOSIS — H409 Unspecified glaucoma: Secondary | ICD-10-CM | POA: Insufficient documentation

## 2015-04-17 DIAGNOSIS — Z8709 Personal history of other diseases of the respiratory system: Secondary | ICD-10-CM | POA: Diagnosis not present

## 2015-04-17 DIAGNOSIS — R410 Disorientation, unspecified: Secondary | ICD-10-CM | POA: Diagnosis not present

## 2015-04-17 DIAGNOSIS — R011 Cardiac murmur, unspecified: Secondary | ICD-10-CM | POA: Insufficient documentation

## 2015-04-17 DIAGNOSIS — Z86010 Personal history of colonic polyps: Secondary | ICD-10-CM | POA: Insufficient documentation

## 2015-04-17 DIAGNOSIS — Z880 Allergy status to penicillin: Secondary | ICD-10-CM | POA: Insufficient documentation

## 2015-04-17 DIAGNOSIS — Z8673 Personal history of transient ischemic attack (TIA), and cerebral infarction without residual deficits: Secondary | ICD-10-CM | POA: Insufficient documentation

## 2015-04-17 LAB — CBC WITH DIFFERENTIAL/PLATELET
BASOS ABS: 0 10*3/uL (ref 0.0–0.1)
BASOS PCT: 0 %
Eosinophils Absolute: 0.1 10*3/uL (ref 0.0–0.7)
Eosinophils Relative: 1 %
HEMATOCRIT: 36.4 % (ref 36.0–46.0)
HEMOGLOBIN: 11.9 g/dL — AB (ref 12.0–15.0)
Lymphocytes Relative: 21 %
Lymphs Abs: 1.8 10*3/uL (ref 0.7–4.0)
MCH: 30.1 pg (ref 26.0–34.0)
MCHC: 32.7 g/dL (ref 30.0–36.0)
MCV: 92.2 fL (ref 78.0–100.0)
Monocytes Absolute: 0.8 10*3/uL (ref 0.1–1.0)
Monocytes Relative: 9 %
NEUTROS ABS: 6 10*3/uL (ref 1.7–7.7)
NEUTROS PCT: 69 %
Platelets: 282 10*3/uL (ref 150–400)
RBC: 3.95 MIL/uL (ref 3.87–5.11)
RDW: 13.3 % (ref 11.5–15.5)
WBC: 8.7 10*3/uL (ref 4.0–10.5)

## 2015-04-17 LAB — URINALYSIS, ROUTINE W REFLEX MICROSCOPIC
Bilirubin Urine: NEGATIVE
Glucose, UA: NEGATIVE mg/dL
Hgb urine dipstick: NEGATIVE
KETONES UR: NEGATIVE mg/dL
LEUKOCYTES UA: NEGATIVE
NITRITE: NEGATIVE
PH: 6.5 (ref 5.0–8.0)
PROTEIN: NEGATIVE mg/dL
Specific Gravity, Urine: 1.017 (ref 1.005–1.030)

## 2015-04-17 LAB — COMPREHENSIVE METABOLIC PANEL
ALBUMIN: 3.4 g/dL — AB (ref 3.5–5.0)
ALT: 11 U/L — AB (ref 14–54)
ANION GAP: 7 (ref 5–15)
AST: 25 U/L (ref 15–41)
Alkaline Phosphatase: 67 U/L (ref 38–126)
BUN: 17 mg/dL (ref 6–20)
CO2: 26 mmol/L (ref 22–32)
Calcium: 8.8 mg/dL — ABNORMAL LOW (ref 8.9–10.3)
Chloride: 105 mmol/L (ref 101–111)
Creatinine, Ser: 0.93 mg/dL (ref 0.44–1.00)
GFR calc non Af Amer: 53 mL/min — ABNORMAL LOW (ref >60)
GLUCOSE: 127 mg/dL — AB (ref 65–99)
POTASSIUM: 4.1 mmol/L (ref 3.5–5.1)
Sodium: 138 mmol/L (ref 135–145)
TOTAL PROTEIN: 6.7 g/dL (ref 6.5–8.1)
Total Bilirubin: 0.5 mg/dL (ref 0.3–1.2)

## 2015-04-17 MED ORDER — FLUCONAZOLE 100 MG PO TABS
200.0000 mg | ORAL_TABLET | Freq: Once | ORAL | Status: AC
  Administered 2015-04-18: 200 mg via ORAL
  Filled 2015-04-17: qty 2

## 2015-04-17 MED ORDER — HYDROCODONE-ACETAMINOPHEN 5-325 MG PO TABS: 1.0000 | ORAL_TABLET | Freq: Four times a day (QID) | ORAL | Status: DC | PRN

## 2015-04-17 NOTE — ED Notes (Signed)
Pt attempted to obtain urine specimen but was not able.

## 2015-04-17 NOTE — ED Provider Notes (Signed)
CSN: [id number]     Arrival date & time 04/17/15  2101 History  By signing my name below, I, Altamease Oiler, attest that this documentation has been prepared under the direction and in the presence of Elnora Morrison, MD. Electronically Signed: Altamease Oiler, ED Scribe. 04/17/2015. 10:51 PM   Chief Complaint  Patient presents with  . Altered Mental Status   The history is provided by the patient and a caregiver. No language interpreter was used.   Karen [name] is a 81 y.o. female with history of stroke,  who presents to the Emergency Department complaining of AMS with onset 3-4 days ago. Patient's granddaughter states that she has been more disoriented and confused. The patient told a relative that her deceased sibling were in bed with her last night.  Pt was discharged from the hospital 2 weeks ago after being treated for fall and  UTI. She had back pain after the fall that is being treated with tramadol.  Pt denies dysuria (but notes itching with urination), increased frequency, headache, chest pain, and cough.  No recent falls or head trauma.   Past Medical History  Diagnosis Date  . ANEMIA 10/21/2008  . AORTIC INSUFFICIENCY 11/26/2009  . BACK PAIN 06/27/2007  . COLON CANCER, PERSONAL HX 02/25/2010  . Diverticulosis of colon (without mention of hemorrhage) 08/14/2006  . GALLSTONES 10/09/2008  . GLAUCOMA NOS 08/14/2006  . HYPERTENSION 08/14/2006  . HYPOKALEMIA, HX OF 11/26/2009  . MELENA, HX OF 10/12/2008  . NEOPLASM, MALIGNANT, COLON, CECUM 10/30/2008  . OCCLUSION, CENTRAL RETINAL ARTERY 08/14/2006  . PERSONAL HX COLONIC POLYPS 10/21/2008  . SYNCOPE, HX OF 08/14/2006  . URI 09/07/2008  . H/O: stroke     behind right eye   Past Surgical History  Procedure Laterality Date  . Appendectomy    . Cataract extraction      bilateral  . Dilation and curettage of uterus    . Tonsillectomy    . Colon surgery      11/20/2008,gallbladder removed  . Ankle fracture surgery  2011    right   Family  History  Problem Relation Age of Onset  . Stroke Neg Hx   . Heart disease Neg Hx   . Breast cancer Sister   . Heart attack Father   . Lung cancer Brother    Social History  Substance Use Topics  . Smoking status: Never Smoker   . Smokeless tobacco: Never Used  . Alcohol Use: No   OB History    No data available     Review of Systems  Constitutional: Positive for appetite change. Negative for fever and chills.  Respiratory: Negative for cough.   Cardiovascular: Negative for chest pain.  Genitourinary: Negative for dysuria and frequency.       Itching with urination  Neurological: Negative for headaches.  Psychiatric/Behavioral: Positive for confusion.  All other systems reviewed and are negative.     Allergies  Morphine and related; Sulfa antibiotics; Amoxicillin; Ciprofloxacin; Methocarbamol; and Trimpex  Home Medications   Prior to Admission medications   Medication Sig Start Date End Date Taking? Authorizing Provider  aspirin 81 MG tablet Take 1 tablet (81 mg total) by mouth daily. 12/05/10   Marletta Lor, MD  atenolol (TENORMIN) 50 MG tablet TAKE ONE TABLET BY MOUTH ONCE DAILY 12/22/14   Marletta Lor, MD  bimatoprost (LUMIGAN) 0.01 % SOLN Place 1 drop into the right eye at bedtime.    Historical Provider, MD  brimonidine (ALPHAGAN P)  0.1 % SOLN Place 1 drop into both eyes every 8 (eight) hours.     Historical Provider, MD  Calcium Carb-Cholecalciferol (CALCIUM 600/VITAMIN D3) 600-800 MG-UNIT TABS Take 1 tablet by mouth daily.    Historical Provider, MD  dorzolamide-timolol (COSOPT) 22.3-6.8 MG/ML ophthalmic solution Place 1 drop into both eyes 3 (three) times daily.     Historical Provider, MD  doxycycline (VIBRA-TABS) 100 MG tablet Take 1 tablet (100 mg total) by mouth every 12 (twelve) hours. 04/05/15   Theodis Blaze, MD  escitalopram (LEXAPRO) 10 MG tablet Take 1 tablet (10 mg total) by mouth daily. 01/26/15   Marletta Lor, MD   HYDROcodone-acetaminophen (NORCO) 5-325 MG tablet Take 1 tablet by mouth every 6 (six) hours as needed for severe pain. 04/17/15   Elnora Morrison, MD  hydrOXYzine (ATARAX/VISTARIL) 25 MG tablet Take 1 tablet (25 mg total) by mouth every 6 (six) hours. Prn itching 12/05/14   Billy Fischer, MD  ibuprofen (ADVIL,MOTRIN) 200 MG tablet Take 600 mg by mouth every 6 (six) hours as needed (Pain).    Historical Provider, MD  traMADol (ULTRAM) 50 MG tablet Take 1 tablet (50 mg total) by mouth every 6 (six) hours as needed for moderate pain. 04/05/15   Theodis Blaze, MD  vitamin C (ASCORBIC ACID) 500 MG tablet Take 500 mg by mouth daily.    Historical Provider, MD   BP 122/77 mmHg  Pulse 67  Temp(Src) 98.6 F (37 C) (Oral)  Resp 18  Ht 5\' 6"  (1.676 m)  Wt 155 lb (70.308 kg)  BMI 25.03 kg/m2  SpO2 98% Physical Exam  Constitutional: She is oriented to person, place, and time. She appears well-developed and well-nourished.  HENT:  Head: Normocephalic.  Eyes: EOM are normal. Pupils are equal, round, and reactive to light.  Neck: Normal range of motion.  Cardiovascular:  Murmur (at right upper sternal border) heard.  Systolic murmur is present  Pulmonary/Chest: Effort normal.  Abdominal: Soft. She exhibits no distension. There is no tenderness.  Musculoskeletal: Normal range of motion.  Sensation grossly intact Grossly 5+ strength in all 4 extremities  Neurological: She is alert and oriented to person, place, and time.  Psychiatric: She has a normal mood and affect.  Nursing note and vitals reviewed.   ED Course  Procedures (including critical care time) DIAGNOSTIC STUDIES: Oxygen Saturation is 98% on RA,  normal by my interpretation.    COORDINATION OF CARE: 10:38 PM Discussed treatment plan which includes lab work with pt and granddaughter  at bedside and they agreed to plan.  Labs Review Labs Reviewed  CBC WITH DIFFERENTIAL/PLATELET - Abnormal; Notable for the following:    Hemoglobin  11.9 (*)    All other components within normal limits  COMPREHENSIVE METABOLIC PANEL - Abnormal; Notable for the following:    Glucose, Bld 127 (*)    Calcium 8.8 (*)    Albumin 3.4 (*)    ALT 11 (*)    GFR calc non Af Amer 53 (*)    All other components within normal limits  URINE CULTURE  URINALYSIS, ROUTINE W REFLEX MICROSCOPIC (NOT AT [facility])    Imaging Review No results found. I have personally reviewed and evaluated these lab results as part of my medical decision-making.   EKG Interpretation None      MDM   Final diagnoses:  Transient confusion   I personally performed the services described in this documentation, which was scribed in my presence. The recorded information  has been reviewed and is accurate.  Patient presents with intermittent mild confusion. Patient currently at baseline no focal neuro deficits. Concern for metabolic/urine infection. Urinalysis pending blood work pending. Patient does take tramadol as needed for pain this is not a new medication for patient. No other new meds per family.  Patient well-appearing on reassessment patient still at her baseline neurologically intact. Blood work reviewed unremarkable, urinalysis no infection. Discussed holding tramadol and follow closely with primary doctor on Monday or Tuesday.  Results and differential diagnosis were discussed with the patient/parent/guardian. Xrays were independently reviewed by myself.  Close follow up outpatient was discussed, comfortable with the plan.   Medications  fluconazole (DIFLUCAN) tablet 200 mg (not administered)    Filed Vitals:   04/17/15 2109 04/17/15 2341  BP: 122/77 153/60  Pulse: 67 55  Temp: 98.6 F (37 C)   TempSrc: Oral   Resp: 18 16  Height: 5\' 6"  (1.676 m)   Weight: 155 lb (70.308 kg)   SpO2: 98% 98%    Final diagnoses:  Transient confusion       Elnora Morrison, MD 04/17/15 2357

## 2015-04-17 NOTE — Discharge Instructions (Signed)
Stop taking tramadol in case it is making you mild confused, take tylenol for pain.  For severe pain take norco or vicodin however realize they have the potential for addiction and it can make you sleepy and has tylenol in it.  No operating machinery while taking.  If you were given medicines take as directed.  If you are on coumadin or contraceptives realize their levels and effectiveness is altered by many different medicines.  If you have any reaction (rash, tongues swelling, other) to the medicines stop taking and see a physician.    If your blood pressure was elevated in the ER make sure you follow up for management with a primary doctor or return for chest pain, shortness of breath or stroke symptoms.  Please follow up as directed and return to the ER or see a physician for new or worsening symptoms.  Thank you. Filed Vitals:   04/17/15 2109  BP: 122/77  Pulse: 67  Temp: 98.6 F (37 C)  TempSrc: Oral  Resp: 18  Height: 5\' 6"  (1.676 m)  Weight: 155 lb (70.308 kg)  SpO2: 98%

## 2015-04-17 NOTE — ED Notes (Addendum)
Caretaker/granddaughter states that pt was hospiatlized for a fall 2 weeks ago and dc'd with 10 day supply of abx for UTI.  Pt started getting disoriented again 2 days ago.  Granddaughter sts she doesn't believe pt ever got over the UTI completely.

## 2015-04-18 DIAGNOSIS — R410 Disorientation, unspecified: Secondary | ICD-10-CM | POA: Diagnosis not present

## 2015-04-19 DIAGNOSIS — K5790 Diverticulosis of intestine, part unspecified, without perforation or abscess without bleeding: Secondary | ICD-10-CM | POA: Diagnosis not present

## 2015-04-19 DIAGNOSIS — H409 Unspecified glaucoma: Secondary | ICD-10-CM | POA: Diagnosis not present

## 2015-04-19 DIAGNOSIS — M6281 Muscle weakness (generalized): Secondary | ICD-10-CM | POA: Diagnosis not present

## 2015-04-19 DIAGNOSIS — I10 Essential (primary) hypertension: Secondary | ICD-10-CM | POA: Diagnosis not present

## 2015-04-19 DIAGNOSIS — M549 Dorsalgia, unspecified: Secondary | ICD-10-CM | POA: Diagnosis not present

## 2015-04-19 DIAGNOSIS — R0781 Pleurodynia: Secondary | ICD-10-CM | POA: Diagnosis not present

## 2015-04-20 LAB — URINE CULTURE

## 2015-04-21 ENCOUNTER — Telehealth (HOSPITAL_BASED_OUTPATIENT_CLINIC_OR_DEPARTMENT_OTHER): Payer: Self-pay | Admitting: Emergency Medicine

## 2015-04-21 DIAGNOSIS — H409 Unspecified glaucoma: Secondary | ICD-10-CM | POA: Diagnosis not present

## 2015-04-21 DIAGNOSIS — M549 Dorsalgia, unspecified: Secondary | ICD-10-CM | POA: Diagnosis not present

## 2015-04-21 DIAGNOSIS — K5790 Diverticulosis of intestine, part unspecified, without perforation or abscess without bleeding: Secondary | ICD-10-CM | POA: Diagnosis not present

## 2015-04-21 DIAGNOSIS — M6281 Muscle weakness (generalized): Secondary | ICD-10-CM | POA: Diagnosis not present

## 2015-04-21 DIAGNOSIS — I10 Essential (primary) hypertension: Secondary | ICD-10-CM | POA: Diagnosis not present

## 2015-04-21 DIAGNOSIS — R0781 Pleurodynia: Secondary | ICD-10-CM | POA: Diagnosis not present

## 2015-04-21 NOTE — Telephone Encounter (Signed)
Post ED Visit - Positive Culture Follow-up: Successful Patient Follow-Up  Culture assessed and recommendations reviewed by: []  Elenor Quinones, Pharm.D. []  Heide Guile, Pharm.D., BCPS []  Parks Neptune, Pharm.D. []  Alycia Rossetti, Pharm.D., BCPS []  [place], Pharm.D., BCPS, AAHIVP []  Legrand Como, Pharm.D., BCPS, AAHIVP []  Milus Glazier, Pharm.D. []  Stephens November, Pharm.D.  Positive urine culture enterococcus  [x]  Patient discharged without antimicrobial prescription and treatment is now indicated []  Organism is resistant to prescribed ED discharge antimicrobial []  Patient with positive blood cultures  Changes discussed with ED provider: Okey Regal PA New antibiotic prescription Fosfomycin 3 grams po x 1 dose  Attempting to notify patient    Hazle Nordmann 04/21/2015, 2:50 PM

## 2015-04-21 NOTE — Progress Notes (Signed)
ED Antimicrobial Stewardship Positive Culture Follow Up   Karen [name] is an 82 y.o. female who presented to [facility] on 04/17/2015 with a chief complaint of  Chief Complaint  Patient presents with  . Altered Mental Status    Recent Results (from the past 720 hour(s))  Urine culture     Status: None   Collection Time: 04/03/15  2:33 PM  Result Value Ref Range Status   Specimen Description URINE, RANDOM  Final   Special Requests NONE  Final   Culture   Final    MULTIPLE SPECIES PRESENT, SUGGEST RECOLLECTION Performed at [facility]    Report Status 04/04/2015 FINAL  Final  Urine culture     Status: None   Collection Time: 04/17/15 11:10 PM  Result Value Ref Range Status   Specimen Description URINE, CATHETERIZED  Final   Special Requests NONE  Final   Culture   Final    >=100,000 COLONIES/mL ENTEROCOCCUS SPECIES Performed at [facility]    Report Status 04/20/2015 FINAL  Final   Organism ID, Bacteria ENTEROCOCCUS SPECIES  Final      Susceptibility   Enterococcus species - MIC*    AMPICILLIN <=2 SENSITIVE Sensitive     LEVOFLOXACIN 1 SENSITIVE Sensitive     NITROFURANTOIN <=16 SENSITIVE Sensitive     VANCOMYCIN 1 SENSITIVE Sensitive     * >=100,000 COLONIES/mL ENTEROCOCCUS SPECIES   [x]  Patient discharged originally without antimicrobial agent and treatment is now indicated  New antibiotic prescription: Fosfomycin 3g PO x1 dose  ED Provider: Okey Regal, PA-C  Jonna Munro, PharmD Candidate

## 2015-04-23 DIAGNOSIS — H409 Unspecified glaucoma: Secondary | ICD-10-CM | POA: Diagnosis not present

## 2015-04-23 DIAGNOSIS — M549 Dorsalgia, unspecified: Secondary | ICD-10-CM | POA: Diagnosis not present

## 2015-04-23 DIAGNOSIS — R0781 Pleurodynia: Secondary | ICD-10-CM | POA: Diagnosis not present

## 2015-04-23 DIAGNOSIS — M6281 Muscle weakness (generalized): Secondary | ICD-10-CM | POA: Diagnosis not present

## 2015-04-23 DIAGNOSIS — K5790 Diverticulosis of intestine, part unspecified, without perforation or abscess without bleeding: Secondary | ICD-10-CM | POA: Diagnosis not present

## 2015-04-23 DIAGNOSIS — I10 Essential (primary) hypertension: Secondary | ICD-10-CM | POA: Diagnosis not present

## 2015-04-26 DIAGNOSIS — R0781 Pleurodynia: Secondary | ICD-10-CM | POA: Diagnosis not present

## 2015-04-26 DIAGNOSIS — I10 Essential (primary) hypertension: Secondary | ICD-10-CM | POA: Diagnosis not present

## 2015-04-26 DIAGNOSIS — K5790 Diverticulosis of intestine, part unspecified, without perforation or abscess without bleeding: Secondary | ICD-10-CM | POA: Diagnosis not present

## 2015-04-26 DIAGNOSIS — M549 Dorsalgia, unspecified: Secondary | ICD-10-CM | POA: Diagnosis not present

## 2015-04-26 DIAGNOSIS — M6281 Muscle weakness (generalized): Secondary | ICD-10-CM | POA: Diagnosis not present

## 2015-04-26 DIAGNOSIS — H409 Unspecified glaucoma: Secondary | ICD-10-CM | POA: Diagnosis not present

## 2015-04-27 ENCOUNTER — Telehealth (HOSPITAL_BASED_OUTPATIENT_CLINIC_OR_DEPARTMENT_OTHER): Payer: Self-pay | Admitting: Emergency Medicine

## 2015-04-29 DIAGNOSIS — M6281 Muscle weakness (generalized): Secondary | ICD-10-CM | POA: Diagnosis not present

## 2015-04-29 DIAGNOSIS — K5790 Diverticulosis of intestine, part unspecified, without perforation or abscess without bleeding: Secondary | ICD-10-CM | POA: Diagnosis not present

## 2015-04-29 DIAGNOSIS — R0781 Pleurodynia: Secondary | ICD-10-CM | POA: Diagnosis not present

## 2015-04-29 DIAGNOSIS — H409 Unspecified glaucoma: Secondary | ICD-10-CM | POA: Diagnosis not present

## 2015-04-29 DIAGNOSIS — M549 Dorsalgia, unspecified: Secondary | ICD-10-CM | POA: Diagnosis not present

## 2015-04-29 DIAGNOSIS — I10 Essential (primary) hypertension: Secondary | ICD-10-CM | POA: Diagnosis not present

## 2015-04-30 DIAGNOSIS — R0781 Pleurodynia: Secondary | ICD-10-CM | POA: Diagnosis not present

## 2015-04-30 DIAGNOSIS — M549 Dorsalgia, unspecified: Secondary | ICD-10-CM | POA: Diagnosis not present

## 2015-04-30 DIAGNOSIS — K5790 Diverticulosis of intestine, part unspecified, without perforation or abscess without bleeding: Secondary | ICD-10-CM | POA: Diagnosis not present

## 2015-04-30 DIAGNOSIS — H409 Unspecified glaucoma: Secondary | ICD-10-CM | POA: Diagnosis not present

## 2015-04-30 DIAGNOSIS — M6281 Muscle weakness (generalized): Secondary | ICD-10-CM | POA: Diagnosis not present

## 2015-04-30 DIAGNOSIS — I10 Essential (primary) hypertension: Secondary | ICD-10-CM | POA: Diagnosis not present

## 2015-05-03 DIAGNOSIS — M6281 Muscle weakness (generalized): Secondary | ICD-10-CM | POA: Diagnosis not present

## 2015-05-03 DIAGNOSIS — H409 Unspecified glaucoma: Secondary | ICD-10-CM | POA: Diagnosis not present

## 2015-05-03 DIAGNOSIS — I10 Essential (primary) hypertension: Secondary | ICD-10-CM | POA: Diagnosis not present

## 2015-05-03 DIAGNOSIS — M549 Dorsalgia, unspecified: Secondary | ICD-10-CM | POA: Diagnosis not present

## 2015-05-03 DIAGNOSIS — R0781 Pleurodynia: Secondary | ICD-10-CM | POA: Diagnosis not present

## 2015-05-03 DIAGNOSIS — K5790 Diverticulosis of intestine, part unspecified, without perforation or abscess without bleeding: Secondary | ICD-10-CM | POA: Diagnosis not present

## 2015-05-06 ENCOUNTER — Ambulatory Visit: Payer: Medicare Other | Admitting: Internal Medicine

## 2015-05-06 DIAGNOSIS — K5790 Diverticulosis of intestine, part unspecified, without perforation or abscess without bleeding: Secondary | ICD-10-CM | POA: Diagnosis not present

## 2015-05-06 DIAGNOSIS — M549 Dorsalgia, unspecified: Secondary | ICD-10-CM | POA: Diagnosis not present

## 2015-05-06 DIAGNOSIS — R0781 Pleurodynia: Secondary | ICD-10-CM | POA: Diagnosis not present

## 2015-05-06 DIAGNOSIS — I10 Essential (primary) hypertension: Secondary | ICD-10-CM | POA: Diagnosis not present

## 2015-05-06 DIAGNOSIS — Z7689 Persons encountering health services in other specified circumstances: Secondary | ICD-10-CM

## 2015-05-06 DIAGNOSIS — M6281 Muscle weakness (generalized): Secondary | ICD-10-CM | POA: Diagnosis not present

## 2015-05-06 DIAGNOSIS — H409 Unspecified glaucoma: Secondary | ICD-10-CM | POA: Diagnosis not present

## 2015-05-10 ENCOUNTER — Telehealth: Payer: Self-pay | Admitting: Internal Medicine

## 2015-05-10 NOTE — Telephone Encounter (Signed)
ok 

## 2015-05-10 NOTE — Telephone Encounter (Signed)
Okay for Physical Therapy.

## 2015-05-10 NOTE — Telephone Encounter (Signed)
Left detailed message on personal voicemail for April, verbal orders to continue physical therapy 2 times a week for 3 weeks for pt okay per Dr.K. Any questions please call office.

## 2015-05-10 NOTE — Telephone Encounter (Signed)
Requesting orders to continue physical therapy 2 times a week for 3 weeks.  Please call back with verbal order.

## 2015-05-12 DIAGNOSIS — M6281 Muscle weakness (generalized): Secondary | ICD-10-CM | POA: Diagnosis not present

## 2015-05-12 DIAGNOSIS — R0781 Pleurodynia: Secondary | ICD-10-CM | POA: Diagnosis not present

## 2015-05-12 DIAGNOSIS — H409 Unspecified glaucoma: Secondary | ICD-10-CM | POA: Diagnosis not present

## 2015-05-12 DIAGNOSIS — K5790 Diverticulosis of intestine, part unspecified, without perforation or abscess without bleeding: Secondary | ICD-10-CM | POA: Diagnosis not present

## 2015-05-12 DIAGNOSIS — M549 Dorsalgia, unspecified: Secondary | ICD-10-CM | POA: Diagnosis not present

## 2015-05-12 DIAGNOSIS — I10 Essential (primary) hypertension: Secondary | ICD-10-CM | POA: Diagnosis not present

## 2015-05-14 DIAGNOSIS — K5790 Diverticulosis of intestine, part unspecified, without perforation or abscess without bleeding: Secondary | ICD-10-CM | POA: Diagnosis not present

## 2015-05-14 DIAGNOSIS — M549 Dorsalgia, unspecified: Secondary | ICD-10-CM | POA: Diagnosis not present

## 2015-05-14 DIAGNOSIS — H409 Unspecified glaucoma: Secondary | ICD-10-CM | POA: Diagnosis not present

## 2015-05-14 DIAGNOSIS — R0781 Pleurodynia: Secondary | ICD-10-CM | POA: Diagnosis not present

## 2015-05-14 DIAGNOSIS — I10 Essential (primary) hypertension: Secondary | ICD-10-CM | POA: Diagnosis not present

## 2015-05-14 DIAGNOSIS — M6281 Muscle weakness (generalized): Secondary | ICD-10-CM | POA: Diagnosis not present

## 2015-05-17 DIAGNOSIS — M6281 Muscle weakness (generalized): Secondary | ICD-10-CM | POA: Diagnosis not present

## 2015-05-17 DIAGNOSIS — I10 Essential (primary) hypertension: Secondary | ICD-10-CM | POA: Diagnosis not present

## 2015-05-17 DIAGNOSIS — R0781 Pleurodynia: Secondary | ICD-10-CM | POA: Diagnosis not present

## 2015-05-17 DIAGNOSIS — M549 Dorsalgia, unspecified: Secondary | ICD-10-CM | POA: Diagnosis not present

## 2015-05-17 DIAGNOSIS — H409 Unspecified glaucoma: Secondary | ICD-10-CM | POA: Diagnosis not present

## 2015-05-17 DIAGNOSIS — K5790 Diverticulosis of intestine, part unspecified, without perforation or abscess without bleeding: Secondary | ICD-10-CM | POA: Diagnosis not present

## 2015-05-20 ENCOUNTER — Encounter: Payer: Self-pay | Admitting: Internal Medicine

## 2015-05-20 ENCOUNTER — Ambulatory Visit (INDEPENDENT_AMBULATORY_CARE_PROVIDER_SITE_OTHER): Payer: Medicare Other | Admitting: Internal Medicine

## 2015-05-20 VITALS — BP 138/70 | HR 71 | Temp 98.3°F | Resp 20

## 2015-05-20 DIAGNOSIS — I10 Essential (primary) hypertension: Secondary | ICD-10-CM | POA: Diagnosis not present

## 2015-05-20 DIAGNOSIS — M545 Low back pain, unspecified: Secondary | ICD-10-CM

## 2015-05-20 MED ORDER — HYDROCODONE-ACETAMINOPHEN 5-325 MG PO TABS: 1.0000 | ORAL_TABLET | Freq: Four times a day (QID) | ORAL | Status: DC | PRN

## 2015-05-20 MED ORDER — TRAMADOL HCL 50 MG PO TABS: 50.0000 mg | ORAL_TABLET | Freq: Four times a day (QID) | ORAL | Status: DC | PRN

## 2015-05-20 NOTE — Patient Instructions (Signed)
Vertebral Fracture °A vertebral fracture is a break in one of the bones that make up the spine (vertebrae). The vertebrae are stacked on top of each other to form the spinal column. They support the body and protect the spinal cord. The vertebral column has an upper part (cervical spine), a middle part (thoracic spine), and a lower part (lumbar spine). Most vertebral fractures occur in the thoracic spine or lumbar spine. °There are three main types of vertebral fractures: °· Flexion fracture. This happens when vertebrae collapse. Vertebrae can collapse: °¨ In the front (compression fracture). This type of fracture is common in people who have a condition that causes their bones to be weak and brittle (osteoporosis). The fracture can make a person lose height. °¨ In the front and back (axial burst fracture). °· Extension fracture. This happens when an external force pulls apart the vertebrae. °· Rotation fracture. This happens when the spine bends extremely in one direction. This type can cause a piece of a vertebra to break off (transverse process fracture) or move out of its normal position (fracture dislocation). This type of fracture has a high risk for spinal cord injury. °Vertebral fractures can range from mild to very severe. The most severe types are those that cause the broken bones to move out of place (unstable) and those that injure or press on the spinal cord. °CAUSES °This condition is usually caused by a forceful injury. This type of injury commonly results from: °· Car accidents. °· Falling or jumping from a great height. °· Collisions in contact sports. °· Violent acts, such as an assault or a gunshot wound. °RISK FACTORS °This injury is more likely to happen to people who: °· Have osteoporosis. °· Participate in contact sports. °· Are in situations that could result in falls or other violent injuries. °SYMPTOMS °Symptoms of this injury depend on the location and the type of fracture. The most common  symptom is back pain that gets worse with movement. You may also have trouble standing or walking. If a fracture has damaged your spinal cord or is pressing on it, you may also have: °· Numbness. °· Tingling. °· Weakness. °· Loss of movement. °· Loss of bowel or bladder control. °DIAGNOSIS °This injury may be diagnosed based on symptoms, medical history, and a physical exam. You may also have imaging tests to confirm the diagnosis. These may include: °· Spine X-ray. °· CT scan. °· MRI. °TREATMENT °Treatment for this injury depends on the type of fracture. If your fracture is stable and does not affect your spinal cord, it may heal with nonsurgical treatment, such as: °· Taking pain medicine. °· Wearing a cast or a brace. °· Doing physical therapy exercises. °If your vertebral fracture is unstable or it affects your spinal cord, you may need surgical treatment, such as: °· Laminectomy. This procedure involves removing the part of a vertebra that is pushing on the spinal cord (spinal decompression surgery). Bone fragments may also be removed. °· Spinal fusion. This procedure is used to stabilize an unstable fracture. Vertebrae may be joined together with a piece of bone from another part of your body (graft) and held in place with rods, plates, or screws. °· Vertebroplasty. In this procedure, bone cement is used to rebuild collapsed vertebrae. °HOME CARE INSTRUCTIONS °General Instructions °· Take medicines only as directed by your health care provider. °· Do not drive or operate heavy machinery while taking pain medicine. °· If directed, apply ice to the injured area: °¨ Put   ice in a plastic bag. °¨ Place a towel between your skin and the bag. °¨ Leave the ice on for 30 minutes every two hours at first. Then apply the ice as needed. °· Wear your neck brace or back brace as directed by your health care provider. °· Do not drink alcohol. Alcohol can interfere with your treatment. °· Keep all follow-up visits as directed  by your health care provider. This is important. It can help to prevent permanent injury, disability, and long-lasting (chronic) pain. °Activity °· Stay in bed (on bed rest) only as directed by your health care provider. Being on bed rest for too long can make your condition worse. °· Return to your normal activities as directed by your health care provider. Ask what activities are safe for you. °· Do exercises to improve motion and strength in your back (physical therapy), as recommended by your health care provider.   °· Exercise regularly as directed by your health care provider. °SEEK MEDICAL CARE IF: °· You have a fever. °· You develop a cough that makes your pain worse. °· Your pain medicine is not helping. °· Your pain does not get better over time. °· You cannot return to your normal activities as planned or expected. °SEEK IMMEDIATE MEDICAL CARE IF: °· Your pain is very bad and it suddenly gets worse. °· You are unable to move any body part (paralysis) that is below the level of your injury. °· You have numbness, tingling, or weakness in any body part that is below the level of your injury. °· You cannot control your bladder or bowels. °  °This information is not intended to replace advice given to you by your health care provider. Make sure you discuss any questions you have with your health care provider. °  °Document Released: 04/20/2004 Document Revised: 07/28/2014 Document Reviewed: 03/18/2014 °Elsevier Interactive Patient Education ©2016 [employer] ° °

## 2015-05-20 NOTE — Progress Notes (Signed)
Pre visit review using our clinic review tool, if applicable. No additional management support is needed unless otherwise documented below in the visit note. 

## 2015-05-20 NOTE — Progress Notes (Signed)
Subjective:    Patient ID: Karen [name], female    DOB: Oct 19, 1922, 82 y.o.   MRN: [id number]  HPI  82 year old patient who presents with a five-day history of low thoracic back pain aggravated by movement.  She was evaluated in the ED on January 6 after a fall resulting in back pain.  Chest and abdominal CT was unremarkable.  No vertebral fracture noted.  The patient continues to have pain, at least intermittently.  She has essential hypertension. Imaging studies did reveal some arthritic changes.  Past Medical History  Diagnosis Date  . ANEMIA 10/21/2008  . AORTIC INSUFFICIENCY 11/26/2009  . BACK PAIN 06/27/2007  . COLON CANCER, PERSONAL HX 02/25/2010  . Diverticulosis of colon (without mention of hemorrhage) 08/14/2006  . GALLSTONES 10/09/2008  . GLAUCOMA NOS 08/14/2006  . HYPERTENSION 08/14/2006  . HYPOKALEMIA, HX OF 11/26/2009  . MELENA, HX OF 10/12/2008  . NEOPLASM, MALIGNANT, COLON, CECUM 10/30/2008  . OCCLUSION, CENTRAL RETINAL ARTERY 08/14/2006  . PERSONAL HX COLONIC POLYPS 10/21/2008  . SYNCOPE, HX OF 08/14/2006  . URI 09/07/2008  . H/O: stroke     behind right eye    Social History   Social History  . Marital Status: Married    Spouse Name: N/A  . Number of Children: 3  . Years of Education: N/A   Occupational History  . retired    Social History Main Topics  . Smoking status: Never Smoker   . Smokeless tobacco: Never Used  . Alcohol Use: No  . Drug Use: No  . Sexual Activity: Not on file   Other Topics Concern  . Not on file   Social History Narrative    Past Surgical History  Procedure Laterality Date  . Appendectomy    . Cataract extraction      bilateral  . Dilation and curettage of uterus    . Tonsillectomy    . Colon surgery      11/20/2008,gallbladder removed  . Ankle fracture surgery  2011    right    Family History  Problem Relation Age of Onset  . Stroke Neg Hx   . Heart disease Neg Hx   . Breast cancer Sister   . Heart attack Father   .  Lung cancer Brother     Allergies  Allergen Reactions  . Morphine And Related Shortness Of Breath  . Sulfa Antibiotics Itching and Swelling  . Amoxicillin Itching and Rash  . Ciprofloxacin Itching and Rash  . Methocarbamol Itching  . Trimpex [Trimethoprim] Itching and Rash    Current Outpatient Prescriptions on File Prior to Visit  Medication Sig Dispense Refill  . aspirin 81 MG tablet Take 1 tablet (81 mg total) by mouth daily. 90 tablet 6  . atenolol (TENORMIN) 50 MG tablet TAKE ONE TABLET BY MOUTH ONCE DAILY 90 tablet 1  . bimatoprost (LUMIGAN) 0.01 % SOLN Place 1 drop into the right eye at bedtime.    . brimonidine (ALPHAGAN P) 0.1 % SOLN Place 1 drop into both eyes every 8 (eight) hours.     . Calcium Carb-Cholecalciferol (CALCIUM 600/VITAMIN D3) 600-800 MG-UNIT TABS Take 1 tablet by mouth daily.    . dorzolamide-timolol (COSOPT) 22.3-6.8 MG/ML ophthalmic solution Place 1 drop into both eyes 3 (three) times daily.     Marland Kitchen escitalopram (LEXAPRO) 10 MG tablet Take 1 tablet (10 mg total) by mouth daily. 90 tablet 2  . ibuprofen (ADVIL,MOTRIN) 200 MG tablet Take 600 mg by mouth every 6 (six)  hours as needed (Pain).    . vitamin C (ASCORBIC ACID) 500 MG tablet Take 500 mg by mouth daily.     No current facility-administered medications on file prior to visit.    BP 138/70 mmHg  Pulse 71  Temp(Src) 98.3 F (36.8 C) (Oral)  Resp 20  SpO2 98%     Review of Systems  Constitutional: Negative.   HENT: Negative for congestion, dental problem, hearing loss, rhinorrhea, sinus pressure, sore throat and tinnitus.   Eyes: Negative for pain, discharge and visual disturbance.  Respiratory: Negative for cough and shortness of breath.   Cardiovascular: Negative for chest pain, palpitations and leg swelling.  Gastrointestinal: Negative for nausea, vomiting, abdominal pain, diarrhea, constipation, blood in stool and abdominal distention.  Genitourinary: Negative for dysuria, urgency,  frequency, hematuria, flank pain, vaginal bleeding, vaginal discharge, difficulty urinating, vaginal pain and pelvic pain.  Musculoskeletal: Positive for back pain. Negative for joint swelling, arthralgias and gait problem.  Skin: Negative for rash.  Neurological: Negative for dizziness, syncope, speech difficulty, weakness, numbness and headaches.  Hematological: Negative for adenopathy.  Psychiatric/Behavioral: Negative for behavioral problems, dysphoric mood and agitation. The patient is not nervous/anxious.        Objective:   Physical Exam  Constitutional: She appears well-developed and well-nourished. No distress.  Sitting in wheelchair Alert and appropriate Afebrile Blood pressure 130/80  Musculoskeletal:  No tenderness over the thoracic spine No punch tenderness          Assessment & Plan:   Thoracic pain.  Possible subacute thoracic vertebral fracture, although not visible on initial imaging studies.  We'll continue analgesics for pain relief.  The patient was given half a dozen hydrocodone from the ED, which was helpful.  She does have a morphine allergy, but did well on the hydrocodone.  Will refill Hypertension, stable

## 2015-05-25 DIAGNOSIS — M549 Dorsalgia, unspecified: Secondary | ICD-10-CM | POA: Diagnosis not present

## 2015-05-25 DIAGNOSIS — M6281 Muscle weakness (generalized): Secondary | ICD-10-CM | POA: Diagnosis not present

## 2015-05-25 DIAGNOSIS — H409 Unspecified glaucoma: Secondary | ICD-10-CM | POA: Diagnosis not present

## 2015-05-25 DIAGNOSIS — I10 Essential (primary) hypertension: Secondary | ICD-10-CM | POA: Diagnosis not present

## 2015-05-25 DIAGNOSIS — K5790 Diverticulosis of intestine, part unspecified, without perforation or abscess without bleeding: Secondary | ICD-10-CM | POA: Diagnosis not present

## 2015-05-25 DIAGNOSIS — R0781 Pleurodynia: Secondary | ICD-10-CM | POA: Diagnosis not present

## 2015-06-01 DIAGNOSIS — K5790 Diverticulosis of intestine, part unspecified, without perforation or abscess without bleeding: Secondary | ICD-10-CM | POA: Diagnosis not present

## 2015-06-01 DIAGNOSIS — M549 Dorsalgia, unspecified: Secondary | ICD-10-CM | POA: Diagnosis not present

## 2015-06-01 DIAGNOSIS — H409 Unspecified glaucoma: Secondary | ICD-10-CM | POA: Diagnosis not present

## 2015-06-01 DIAGNOSIS — M6281 Muscle weakness (generalized): Secondary | ICD-10-CM | POA: Diagnosis not present

## 2015-06-01 DIAGNOSIS — I10 Essential (primary) hypertension: Secondary | ICD-10-CM | POA: Diagnosis not present

## 2015-06-01 DIAGNOSIS — R0781 Pleurodynia: Secondary | ICD-10-CM | POA: Diagnosis not present

## 2015-06-04 DIAGNOSIS — R0781 Pleurodynia: Secondary | ICD-10-CM | POA: Diagnosis not present

## 2015-06-04 DIAGNOSIS — I10 Essential (primary) hypertension: Secondary | ICD-10-CM | POA: Diagnosis not present

## 2015-06-04 DIAGNOSIS — H409 Unspecified glaucoma: Secondary | ICD-10-CM | POA: Diagnosis not present

## 2015-06-04 DIAGNOSIS — M6281 Muscle weakness (generalized): Secondary | ICD-10-CM | POA: Diagnosis not present

## 2015-06-04 DIAGNOSIS — K5790 Diverticulosis of intestine, part unspecified, without perforation or abscess without bleeding: Secondary | ICD-10-CM | POA: Diagnosis not present

## 2015-06-04 DIAGNOSIS — M549 Dorsalgia, unspecified: Secondary | ICD-10-CM | POA: Diagnosis not present

## 2015-06-29 ENCOUNTER — Other Ambulatory Visit: Payer: Self-pay | Admitting: *Deleted

## 2015-06-29 MED ORDER — ATENOLOL 50 MG PO TABS: 50.0000 mg | ORAL_TABLET | Freq: Every day | ORAL | Status: DC

## 2015-06-30 ENCOUNTER — Other Ambulatory Visit (HOSPITAL_BASED_OUTPATIENT_CLINIC_OR_DEPARTMENT_OTHER): Payer: Medicare Other

## 2015-06-30 ENCOUNTER — Ambulatory Visit (HOSPITAL_BASED_OUTPATIENT_CLINIC_OR_DEPARTMENT_OTHER): Payer: Medicare Other | Admitting: Oncology

## 2015-06-30 VITALS — BP 160/72 | HR 80 | Temp 97.8°F | Resp 18 | Ht 66.0 in | Wt 151.3 lb

## 2015-06-30 DIAGNOSIS — C180 Malignant neoplasm of cecum: Secondary | ICD-10-CM | POA: Diagnosis not present

## 2015-06-30 DIAGNOSIS — Z85038 Personal history of other malignant neoplasm of large intestine: Secondary | ICD-10-CM | POA: Diagnosis not present

## 2015-06-30 DIAGNOSIS — R591 Generalized enlarged lymph nodes: Secondary | ICD-10-CM

## 2015-06-30 LAB — CBC WITH DIFFERENTIAL/PLATELET
BASO%: 0.1 % (ref 0.0–2.0)
BASOS ABS: 0 10*3/uL (ref 0.0–0.1)
EOS ABS: 0.1 10*3/uL (ref 0.0–0.5)
EOS%: 0.6 % (ref 0.0–7.0)
HCT: 37.7 % (ref 34.8–46.6)
HGB: 12.6 g/dL (ref 11.6–15.9)
LYMPH#: 1.8 10*3/uL (ref 0.9–3.3)
LYMPH%: 14.5 % (ref 14.0–49.7)
MCH: 29.6 pg (ref 25.1–34.0)
MCHC: 33.4 g/dL (ref 31.5–36.0)
MCV: 88.7 fL (ref 79.5–101.0)
MONO#: 0.8 10*3/uL (ref 0.1–0.9)
MONO%: 6.7 % (ref 0.0–14.0)
NEUT#: 9.6 10*3/uL — ABNORMAL HIGH (ref 1.5–6.5)
NEUT%: 78.1 % — AB (ref 38.4–76.8)
Platelets: 239 10*3/uL (ref 145–400)
RBC: 4.25 10*6/uL (ref 3.70–5.45)
RDW: 14.4 % (ref 11.2–14.5)
WBC: 12.3 10*3/uL — ABNORMAL HIGH (ref 3.9–10.3)

## 2015-06-30 LAB — COMPREHENSIVE METABOLIC PANEL
ALK PHOS: 61 U/L (ref 40–150)
ALT: 10 U/L (ref 0–55)
AST: 26 U/L (ref 5–34)
Albumin: 3.4 g/dL — ABNORMAL LOW (ref 3.5–5.0)
Anion Gap: 8 mEq/L (ref 3–11)
BUN: 13.9 mg/dL (ref 7.0–26.0)
CALCIUM: 9.2 mg/dL (ref 8.4–10.4)
CHLORIDE: 105 meq/L (ref 98–109)
CO2: 27 mEq/L (ref 22–29)
Creatinine: 0.8 mg/dL (ref 0.6–1.1)
EGFR: 62 mL/min/{1.73_m2} — AB (ref >90)
GLUCOSE: 110 mg/dL (ref 70–140)
POTASSIUM: 3.7 meq/L (ref 3.5–5.1)
SODIUM: 141 meq/L (ref 136–145)
Total Bilirubin: 0.36 mg/dL (ref 0.20–1.20)
Total Protein: 7.4 g/dL (ref 6.4–8.3)

## 2015-06-30 NOTE — Progress Notes (Signed)
Hematology and Oncology Follow Up Visit  Karen [name] [id number] 1922-05-01 82 y.o. 06/30/2015 10:14 AM  CC: [name], MD  Lowella Bandy. Olevia Perches, MD  Marletta Lor, MD    Principle Diagnosis: An 82 year old female with the following issues:  1. Stage II colorectal cancer diagnosed in 2010.  She had a T3 N0 disease, status post surgical resection.  2. Questionable inflammatory versus reactive lymphadenopathy that is PET negative.   Prior Therapy: She underwent right hemicolectomy on a T3 N0 disease.  Eleven lymph nodes sampled, 0 involved.  That was done on November 20, 2008.  Current therapy: Observation and suervillance  Interim History:  Karen [name] presents today for a followup visit. Since the last visit, she was hospitalized In January 2017 after she sustained a fall. She did have a CT scan chest abdomen and pelvis which showed no major organ damage but also rule out any malignancy. Since her discharge, she had been doing reasonably fair. She is ambulating with the help of a walker and a cane. She lives with her granddaughter and attends to most activities of daily living. She denied any GI symptoms at this time including nausea, vomiting or abdominal pain.  She does not report any headaches, blurry vision, syncope or seizures. She does not report any fevers, chills, sweats or weight loss. Her appetite is reasonable. She does not report any chest pain, palpitation, orthopnea. She does not report any cough or hemoptysis or hematemesis. She does not report any abdominal distention or early satiety. She does not report any frequency urgency or hesitancy. She does not report any skeletal complaints. Remainder review of systems unremarkable.   Medications: I have reviewed the patient's current medications. Current Outpatient Prescriptions  Medication Sig Dispense Refill  . aspirin 81 MG tablet Take 1 tablet (81 mg total) by mouth daily. 90 tablet 6  . atenolol (TENORMIN) 50 MG tablet  Take 1 tablet (50 mg total) by mouth daily. 90 tablet 1  . bimatoprost (LUMIGAN) 0.01 % SOLN Place 1 drop into the right eye at bedtime.    . brimonidine (ALPHAGAN P) 0.1 % SOLN Place 1 drop into both eyes every 8 (eight) hours.     . Calcium Carb-Cholecalciferol (CALCIUM 600/VITAMIN D3) 600-800 MG-UNIT TABS Take 1 tablet by mouth daily.    . dorzolamide-timolol (COSOPT) 22.3-6.8 MG/ML ophthalmic solution Place 1 drop into both eyes 3 (three) times daily.     Marland Kitchen escitalopram (LEXAPRO) 10 MG tablet Take 1 tablet (10 mg total) by mouth daily. 90 tablet 2  . HYDROcodone-acetaminophen (NORCO) 5-325 MG tablet Take 1 tablet by mouth every 6 (six) hours as needed for severe pain. 60 tablet 0  . ibuprofen (ADVIL,MOTRIN) 200 MG tablet Take 600 mg by mouth every 6 (six) hours as needed (Pain).    . traMADol (ULTRAM) 50 MG tablet Take 1 tablet (50 mg total) by mouth every 6 (six) hours as needed for moderate pain. 60 tablet 0  . vitamin C (ASCORBIC ACID) 500 MG tablet Take 500 mg by mouth daily.     No current facility-administered medications for this visit.    Allergies:  Allergies  Allergen Reactions  . Morphine And Related Shortness Of Breath  . Sulfa Antibiotics Itching and Swelling  . Amoxicillin Itching and Rash  . Ciprofloxacin Itching and Rash  . Methocarbamol Itching  . Trimpex [Trimethoprim] Itching and Rash       Physical Exam: Blood pressure 160/72, pulse 80, temperature 97.8 F (36.6 C),  temperature source Oral, resp. rate 18, height 5\' 6"  (1.676 m), weight 151 lb 4.8 oz (68.629 kg), SpO2 98 %. ECOG: 1 General appearance: alert awake.  Without distress. Head: Normocephalic, without obvious abnormality Neck: no adenopathy Lymph nodes: Cervical, supraclavicular, and axillary nodes normal. Heart:regular rate and rhythm, S1, S2 normal, no murmur, click, rub or gallop Lung:chest clear, no wheezing, rales, normal symmetric air entry Abdomin: soft, non-tender, without masses or  organomegaly no shifting dullness or ascites. EXT:no erythema, induration, or nodules   Lab Results: Lab Results  Component Value Date   WBC 12.3* 06/30/2015   HGB 12.6 06/30/2015   HCT 37.7 06/30/2015   MCV 88.7 06/30/2015   PLT 239 06/30/2015     Chemistry      Component Value Date/Time   NA 138 04/17/2015 2300   NA 141 06/30/2014 0936   NA 145 12/13/2010 1053   K 4.1 04/17/2015 2300   K 3.7 06/30/2014 0936   K 4.0 12/13/2010 1053   CL 105 04/17/2015 2300   CL 103 06/24/2012 0932   CL 102 12/13/2010 1053   CO2 26 04/17/2015 2300   CO2 25 06/30/2014 0936   CO2 27 12/13/2010 1053   BUN 17 04/17/2015 2300   BUN 9.2 06/30/2014 0936   BUN 15 12/13/2010 1053   CREATININE 0.93 04/17/2015 2300   CREATININE 0.8 06/30/2014 0936   CREATININE 0.8 12/13/2010 1053      Component Value Date/Time   CALCIUM 8.8* 04/17/2015 2300   CALCIUM 8.5 06/30/2014 0936   CALCIUM 9.1 12/13/2010 1053   ALKPHOS 67 04/17/2015 2300   ALKPHOS 73 06/30/2014 0936   ALKPHOS 66 12/13/2010 1053   AST 25 04/17/2015 2300   AST 21 06/30/2014 0936   AST 22 12/13/2010 1053   ALT 11* 04/17/2015 2300   ALT 9 06/30/2014 0936   ALT 16 12/13/2010 1053   BILITOT 0.5 04/17/2015 2300   BILITOT 0.32 06/30/2014 0936   BILITOT 0.50 12/13/2010 1053     EXAM: CT CHEST, ABDOMEN, AND PELVIS WITH CONTRAST  TECHNIQUE: Multidetector CT imaging of the chest, abdomen and pelvis was performed following the standard protocol during bolus administration of intravenous contrast.  CONTRAST: 138mL OMNIPAQUE IOHEXOL 300 MG/ML SOLN  COMPARISON: CT chest 06/24/2012  FINDINGS: CT CHEST  Lungs are clear without infiltrate or effusion. No pneumothorax.  13 mm calcified granuloma right upper lobe is unchanged.  Mild mediastinal adenopathy similar to the prior study. Right peritracheal and precarinal lymph nodes measuring up to 15 mm are stable. Mild anterior mediastinal lymphadenopathy also unchanged.  No lung mass identified.  Cardiac enlargement without pericardial effusion. Moderate coronary calcification. Diffuse aortic arch calcification without aneurysm.  Negative for rib or thoracic spine fracture.  CT ABDOMEN AND PELVIS  Prior cholecystectomy. Central bile ducts mildly prominent but unchanged. 10 x 24 mm cyst right lobe liver caudally unchanged. 1 cm cyst left lobe liver unchanged. Liver size and contour normal.  Normal pancreas and spleen  Bilateral renal cysts. No renal mass or obstruction. No urinary tract calculi. Urinary bladder normal.  Negative for bowel obstruction. Surgical suture line in the right colon. Moderate sigmoid diverticulosis without evidence of diverticulitis.  Atherosclerotic calcification in the abdominal aorta and iliac arteries without aneurysm.  No mass or adenopathy in the abdomen.  No free fluid.  Lumbar degenerative changes without fracture or bone lesion.  IMPRESSION: No acute abnormality in the chest. Mild mediastinal adenopathy is unchanged from the prior CT 2014.  Cardiac enlargement with  coronary calcification  No acute abnormality in the abdomen.      Impression and Plan:  This is a pleasant 82 year old woman with the following issues:  1. Stage II colorectal cancer. She continues to have no evidence of recurrent disease at this time. Her laboratory testing, and CT scan images from January 2017 were reviewed and did not show any evidence of recurrent cancer. No further oncology follow-up or evaluation is needed at this time. It will be extremely unlikely to see any cancer recurrence at this time. 2. Reactive mediastinal lymphadenopathy. Do not appear to be pathological in nature. CT scan in January 2017 showed these lymph nodes have not changed over the last 6-7 years. No further evaluation is needed. 3.     Follow-up: I'll be happy to see her in the future as needed.   [facility], MD 4/5/201710:14 AM

## 2015-07-01 LAB — CEA: CEA1: 6.1 ng/mL — AB (ref 0.0–4.7)

## 2015-07-19 ENCOUNTER — Telehealth: Payer: Self-pay | Admitting: Internal Medicine

## 2015-07-19 NOTE — Telephone Encounter (Signed)
That is fine 

## 2015-07-19 NOTE — Telephone Encounter (Signed)
Daughter called to request an appointment to see Dr Raliegh Ip. Pt has 2 large cysts in the middle of her breast (cleavage) they have grown and are tender to the touch. I used the same day 10:15 tomorrow.  Is that OK?

## 2015-07-20 ENCOUNTER — Encounter: Payer: Self-pay | Admitting: Internal Medicine

## 2015-07-20 ENCOUNTER — Ambulatory Visit (INDEPENDENT_AMBULATORY_CARE_PROVIDER_SITE_OTHER): Payer: Medicare Other | Admitting: Internal Medicine

## 2015-07-20 VITALS — BP 140/80 | HR 85 | Temp 97.9°F | Resp 20 | Ht 66.0 in | Wt 149.0 lb

## 2015-07-20 DIAGNOSIS — I10 Essential (primary) hypertension: Secondary | ICD-10-CM | POA: Diagnosis not present

## 2015-07-20 DIAGNOSIS — L723 Sebaceous cyst: Secondary | ICD-10-CM

## 2015-07-20 MED ORDER — DOXYCYCLINE HYCLATE 100 MG PO TABS: 100.0000 mg | ORAL_TABLET | Freq: Two times a day (BID) | ORAL | Status: DC

## 2015-07-20 NOTE — Patient Instructions (Signed)
Sebaceous Cyst Removal, Care After °Refer to this sheet in the next few weeks. These instructions provide you with information about caring for yourself after your procedure. Your health care provider may also give you more specific instructions. Your treatment has been planned according to current medical practices, but problems sometimes occur. Call your health care provider if you have any problems or questions after your procedure. °WHAT TO EXPECT AFTER THE PROCEDURE °After your procedure, it is common to have: °· Soreness in the area where your cyst was removed. °· Tightness or itching from your skin sutures. °HOME CARE INSTRUCTIONS °· Take medicines only as directed by your health care provider. °· If you were prescribed an antibiotic medicine, finish all of it even if you start to feel better. °· Use antibiotic ointment as directed by your health care provider. Follow the instructions carefully. °· There are many different ways to close and cover an incision, including stitches (sutures), skin glue, and adhesive strips. Follow your health care provider's instructions about: °¨ Incision care. °¨ Bandage (dressing) changes and removal. °¨ Incision closure removal. °· Keep the bandage (dressing) dry until your health care provider says that it can be removed. Take sponge baths only. Ask your health care provider when you can start showering or taking a bath. °· After your dressing is off, check your incision every day for signs of infection. Watch for: °¨ Redness, swelling, or pain. °¨ Fluid, blood, or pus. °· You can return to your normal activities. Do not do anything that stretches or puts pressure on your incision. °· You can return to your normal diet. °· Keep all follow-up visits as directed by your health care provider. This is important. °SEEK MEDICAL CARE IF: °· You have a fever. °· Your incision bleeds. °· You have redness, swelling, or pain in the incision area. °· You have fluid, blood, or pus coming  from your incision. °· Your cyst comes back after surgery. °  °This information is not intended to replace advice given to you by your health care provider. Make sure you discuss any questions you have with your health care provider. °  °Document Released: 04/03/2014 Document Reviewed: 04/03/2014 °Elsevier Interactive Patient Education ©2016 [employer] ° °

## 2015-07-20 NOTE — Progress Notes (Signed)
Subjective:    Patient ID: Karen [name], female    DOB: Jun 20, 1922, 82 y.o.   MRN: [id number]  HPI  82 year old patient who has a long history of 2 nodules in the mid chest area.  One of these nodules has become larger, red, inflamed and tender. She has essential hypertension which has been stable She has remote history of colon cancer and has been released by oncology  Past Medical History  Diagnosis Date  . ANEMIA 10/21/2008  . AORTIC INSUFFICIENCY 11/26/2009  . BACK PAIN 06/27/2007  . COLON CANCER, PERSONAL HX 02/25/2010  . Diverticulosis of colon (without mention of hemorrhage) 08/14/2006  . GALLSTONES 10/09/2008  . GLAUCOMA NOS 08/14/2006  . HYPERTENSION 08/14/2006  . HYPOKALEMIA, HX OF 11/26/2009  . MELENA, HX OF 10/12/2008  . NEOPLASM, MALIGNANT, COLON, CECUM 10/30/2008  . OCCLUSION, CENTRAL RETINAL ARTERY 08/14/2006  . PERSONAL HX COLONIC POLYPS 10/21/2008  . SYNCOPE, HX OF 08/14/2006  . URI 09/07/2008  . H/O: stroke     behind right eye     Social History   Social History  . Marital Status: Married    Spouse Name: N/A  . Number of Children: 3  . Years of Education: N/A   Occupational History  . retired    Social History Main Topics  . Smoking status: Never Smoker   . Smokeless tobacco: Never Used  . Alcohol Use: No  . Drug Use: No  . Sexual Activity: Not on file   Other Topics Concern  . Not on file   Social History Narrative    Past Surgical History  Procedure Laterality Date  . Appendectomy    . Cataract extraction      bilateral  . Dilation and curettage of uterus    . Tonsillectomy    . Colon surgery      11/20/2008,gallbladder removed  . Ankle fracture surgery  2011    right    Family History  Problem Relation Age of Onset  . Stroke Neg Hx   . Heart disease Neg Hx   . Breast cancer Sister   . Heart attack Father   . Lung cancer Brother     Allergies  Allergen Reactions  . Morphine And Related Shortness Of Breath  . Sulfa Antibiotics  Itching and Swelling  . Amoxicillin Itching and Rash  . Ciprofloxacin Itching and Rash  . Methocarbamol Itching  . Trimpex [Trimethoprim] Itching and Rash    Current Outpatient Prescriptions on File Prior to Visit  Medication Sig Dispense Refill  . aspirin 81 MG tablet Take 1 tablet (81 mg total) by mouth daily. 90 tablet 6  . atenolol (TENORMIN) 50 MG tablet Take 1 tablet (50 mg total) by mouth daily. 90 tablet 1  . bimatoprost (LUMIGAN) 0.01 % SOLN Place 1 drop into the right eye at bedtime.    . brimonidine (ALPHAGAN P) 0.1 % SOLN Place 1 drop into both eyes every 8 (eight) hours.     . Calcium Carb-Cholecalciferol (CALCIUM 600/VITAMIN D3) 600-800 MG-UNIT TABS Take 1 tablet by mouth daily.    . dorzolamide-timolol (COSOPT) 22.3-6.8 MG/ML ophthalmic solution Place 1 drop into both eyes 3 (three) times daily.     Marland Kitchen escitalopram (LEXAPRO) 10 MG tablet Take 1 tablet (10 mg total) by mouth daily. 90 tablet 2  . ibuprofen (ADVIL,MOTRIN) 200 MG tablet Take 600 mg by mouth every 6 (six) hours as needed (Pain).    . vitamin C (ASCORBIC ACID) 500 MG tablet Take  500 mg by mouth daily.     No current facility-administered medications on file prior to visit.    BP 140/80 mmHg  Pulse 85  Temp(Src) 97.9 F (36.6 C) (Oral)  Resp 20  Ht 5\' 6"  (1.676 m)  Wt 149 lb (67.586 kg)  BMI 24.06 kg/m2  SpO2 97%     Review of Systems  Constitutional: Negative.   HENT: Negative for congestion, dental problem, hearing loss, rhinorrhea, sinus pressure, sore throat and tinnitus.   Eyes: Negative for pain, discharge and visual disturbance.  Respiratory: Negative for cough and shortness of breath.   Cardiovascular: Negative for chest pain, palpitations and leg swelling.  Gastrointestinal: Negative for nausea, vomiting, abdominal pain, diarrhea, constipation, blood in stool and abdominal distention.  Genitourinary: Negative for dysuria, urgency, frequency, hematuria, flank pain, vaginal bleeding, vaginal  discharge, difficulty urinating, vaginal pain and pelvic pain.  Musculoskeletal: Negative for joint swelling, arthralgias and gait problem.  Skin: Positive for wound. Negative for rash.  Neurological: Negative for dizziness, syncope, speech difficulty, weakness, numbness and headaches.  Hematological: Negative for adenopathy.  Psychiatric/Behavioral: Negative for behavioral problems, dysphoric mood and agitation. The patient is not nervous/anxious.        Objective:   Physical Exam  Constitutional: She appears well-developed and well-nourished. No distress.  Skin:  2.  Nodular lesions in the mid chest area 1 and a half to 2 cm in diet and motor.  The inferior nodule was fluctuant with surrounding erythema and tenderness          Assessment & Plan:   Infected sebaceous cyst, anterior chest wall.  Procedure note after informed consent and local cleansing with alcohol and Betadine; 1% lidocaine was used for local anesthesia.  An I&D was performed and the contents removed.  Antibiotic ointment applied and the lesion dressed

## 2015-07-20 NOTE — Progress Notes (Signed)
Pre visit review using our clinic review tool, if applicable. No additional management support is needed unless otherwise documented below in the visit note. 

## 2015-08-07 ENCOUNTER — Other Ambulatory Visit: Payer: Self-pay | Admitting: Internal Medicine

## 2015-09-07 ENCOUNTER — Ambulatory Visit: Payer: Medicare Other | Admitting: Internal Medicine

## 2015-09-07 DIAGNOSIS — Z0289 Encounter for other administrative examinations: Secondary | ICD-10-CM

## 2015-09-14 ENCOUNTER — Ambulatory Visit (INDEPENDENT_AMBULATORY_CARE_PROVIDER_SITE_OTHER): Payer: Medicare Other | Admitting: Internal Medicine

## 2015-09-14 ENCOUNTER — Encounter: Payer: Self-pay | Admitting: Internal Medicine

## 2015-09-14 VITALS — BP 130/70 | HR 64 | Temp 98.2°F | Resp 18 | Ht 66.0 in | Wt 147.0 lb

## 2015-09-14 DIAGNOSIS — F32A Depression, unspecified: Secondary | ICD-10-CM

## 2015-09-14 DIAGNOSIS — F329 Major depressive disorder, single episode, unspecified: Secondary | ICD-10-CM

## 2015-09-14 DIAGNOSIS — F4321 Adjustment disorder with depressed mood: Secondary | ICD-10-CM | POA: Diagnosis not present

## 2015-09-14 DIAGNOSIS — C180 Malignant neoplasm of cecum: Secondary | ICD-10-CM

## 2015-09-14 DIAGNOSIS — I10 Essential (primary) hypertension: Secondary | ICD-10-CM

## 2015-09-14 NOTE — Progress Notes (Signed)
Pre visit review using our clinic review tool, if applicable. No additional management support is needed unless otherwise documented below in the visit note. 

## 2015-09-14 NOTE — Patient Instructions (Signed)
Behavioral health consultation as discussed  Return in 3 months for follow-up

## 2015-09-14 NOTE — Progress Notes (Signed)
Subjective:    Patient ID: Karen [name], female    DOB: Mar 29, 1922, 82 y.o.   MRN: [id number]  HPI   82 year old patient who is seen today for follow-up.  Her granddaughter feels that she has at times become more depressed.  Some days she sleeps  Through most of the day.  The patient has a history of a prior retinal artery occlusion and has very poor visual acuity.  She is no longer able to read or watch TV.  She does have a history of depression and has been on Lexapro.  Past Medical History  Diagnosis Date  . ANEMIA 10/21/2008  . AORTIC INSUFFICIENCY 11/26/2009  . BACK PAIN 06/27/2007  . COLON CANCER, PERSONAL HX 02/25/2010  . Diverticulosis of colon (without mention of hemorrhage) 08/14/2006  . GALLSTONES 10/09/2008  . GLAUCOMA NOS 08/14/2006  . HYPERTENSION 08/14/2006  . HYPOKALEMIA, HX OF 11/26/2009  . MELENA, HX OF 10/12/2008  . NEOPLASM, MALIGNANT, COLON, CECUM 10/30/2008  . OCCLUSION, CENTRAL RETINAL ARTERY 08/14/2006  . PERSONAL HX COLONIC POLYPS 10/21/2008  . SYNCOPE, HX OF 08/14/2006  . URI 09/07/2008  . H/O: stroke     behind right eye     Social History   Social History  . Marital Status: Married    Spouse Name: N/A  . Number of Children: 3  . Years of Education: N/A   Occupational History  . retired    Social History Main Topics  . Smoking status: Never Smoker   . Smokeless tobacco: Never Used  . Alcohol Use: No  . Drug Use: No  . Sexual Activity: Not on file   Other Topics Concern  . Not on file   Social History Narrative    Past Surgical History  Procedure Laterality Date  . Appendectomy    . Cataract extraction      bilateral  . Dilation and curettage of uterus    . Tonsillectomy    . Colon surgery      11/20/2008,gallbladder removed  . Ankle fracture surgery  2011    right    Family History  Problem Relation Age of Onset  . Stroke Neg Hx   . Heart disease Neg Hx   . Breast cancer Sister   . Heart attack Father   . Lung cancer Brother      Allergies  Allergen Reactions  . Morphine And Related Shortness Of Breath  . Sulfa Antibiotics Itching and Swelling  . Amoxicillin Itching and Rash  . Ciprofloxacin Itching and Rash  . Methocarbamol Itching  . Trimpex [Trimethoprim] Itching and Rash    Current Outpatient Prescriptions on File Prior to Visit  Medication Sig Dispense Refill  . aspirin 81 MG tablet Take 1 tablet (81 mg total) by mouth daily. 90 tablet 6  . atenolol (TENORMIN) 50 MG tablet Take 1 tablet (50 mg total) by mouth daily. 90 tablet 1  . bimatoprost (LUMIGAN) 0.01 % SOLN Place 1 drop into the right eye at bedtime.    . brimonidine (ALPHAGAN P) 0.1 % SOLN Place 1 drop into both eyes every 8 (eight) hours.     . Calcium Carb-Cholecalciferol (CALCIUM 600/VITAMIN D3) 600-800 MG-UNIT TABS Take 1 tablet by mouth daily.    . dorzolamide-timolol (COSOPT) 22.3-6.8 MG/ML ophthalmic solution Place 1 drop into both eyes 3 (three) times daily.     Marland Kitchen escitalopram (LEXAPRO) 10 MG tablet TAKE 1 TABLET (10 MG TOTAL) BY MOUTH DAILY. 90 tablet 1  . ibuprofen (ADVIL,MOTRIN) 200  MG tablet Take 600 mg by mouth every 6 (six) hours as needed (Pain).    . vitamin C (ASCORBIC ACID) 500 MG tablet Take 500 mg by mouth daily.     No current facility-administered medications on file prior to visit.    BP 130/70 mmHg  Pulse 64  Temp(Src) 98.2 F (36.8 C) (Oral)  Resp 18  Ht 5\' 6"  (1.676 m)  Wt 147 lb (66.679 kg)  BMI 23.74 kg/m2  SpO2 98%     Review of Systems  Constitutional: Negative.   HENT: Negative for congestion, dental problem, hearing loss, rhinorrhea, sinus pressure, sore throat and tinnitus.   Eyes: Positive for visual disturbance. Negative for pain and discharge.  Respiratory: Negative for cough and shortness of breath.   Cardiovascular: Negative for chest pain, palpitations and leg swelling.  Gastrointestinal: Negative for nausea, vomiting, abdominal pain, diarrhea, constipation, blood in stool and abdominal  distention.  Genitourinary: Negative for dysuria, urgency, frequency, hematuria, flank pain, vaginal bleeding, vaginal discharge, difficulty urinating, vaginal pain and pelvic pain.  Musculoskeletal: Negative for joint swelling, arthralgias and gait problem.  Skin: Negative for rash.  Neurological: Negative for dizziness, syncope, speech difficulty, weakness, numbness and headaches.  Hematological: Negative for adenopathy.  Psychiatric/Behavioral: Positive for dysphoric mood. Negative for behavioral problems and agitation. The patient is nervous/anxious.        Objective:   Physical Exam  Constitutional: She is oriented to person, place, and time. She appears well-developed and well-nourished.  HENT:  Head: Normocephalic.  Right Ear: External ear normal.  Left Ear: External ear normal.  Mouth/Throat: Oropharynx is clear and moist.  Eyes: Conjunctivae and EOM are normal. Pupils are equal, round, and reactive to light.  Neck: Normal range of motion. Neck supple. No thyromegaly present.  Cardiovascular: Normal rate, regular rhythm, normal heart sounds and intact distal pulses.   Pulmonary/Chest: Effort normal and breath sounds normal.  Abdominal: Soft. Bowel sounds are normal. She exhibits no mass. There is no tenderness.  Musculoskeletal: Normal range of motion.  Lymphadenopathy:    She has no cervical adenopathy.  Neurological: She is alert and oriented to person, place, and time.  Skin: Skin is warm and dry. No rash noted.  Psychiatric: She has a normal mood and affect. Her behavior is normal.          Assessment & Plan:     Adjustment disorder with depressed mood .  Hypertension, stable .  History of colon cancer, stable  .  We'll continue present regimen .  Behavioral health referral.  Discussed  contact information dispensed  .  Return here in 3 months or as needed  Nyoka Cowden, MD

## 2015-10-03 ENCOUNTER — Inpatient Hospital Stay (HOSPITAL_COMMUNITY): Payer: Medicare Other

## 2015-10-03 ENCOUNTER — Emergency Department (HOSPITAL_COMMUNITY): Payer: Medicare Other

## 2015-10-03 ENCOUNTER — Encounter (HOSPITAL_COMMUNITY): Payer: Self-pay | Admitting: Emergency Medicine

## 2015-10-03 ENCOUNTER — Inpatient Hospital Stay (HOSPITAL_COMMUNITY)
Admission: EM | Admit: 2015-10-03 | Discharge: 2015-10-05 | DRG: 065 | Disposition: A | Discharge status: Rehabilitation Facility | Payer: Medicare Other | Attending: Internal Medicine | Admitting: Internal Medicine

## 2015-10-03 DIAGNOSIS — R1319 Other dysphagia: Secondary | ICD-10-CM | POA: Diagnosis present

## 2015-10-03 DIAGNOSIS — R531 Weakness: Secondary | ICD-10-CM | POA: Diagnosis not present

## 2015-10-03 DIAGNOSIS — E785 Hyperlipidemia, unspecified: Secondary | ICD-10-CM | POA: Diagnosis not present

## 2015-10-03 DIAGNOSIS — Z66 Do not resuscitate: Secondary | ICD-10-CM | POA: Diagnosis not present

## 2015-10-03 DIAGNOSIS — R29810 Facial weakness: Secondary | ICD-10-CM | POA: Diagnosis not present

## 2015-10-03 DIAGNOSIS — K5900 Constipation, unspecified: Secondary | ICD-10-CM | POA: Diagnosis present

## 2015-10-03 DIAGNOSIS — Z8673 Personal history of transient ischemic attack (TIA), and cerebral infarction without residual deficits: Secondary | ICD-10-CM

## 2015-10-03 DIAGNOSIS — I63031 Cerebral infarction due to thrombosis of right carotid artery: Secondary | ICD-10-CM | POA: Diagnosis not present

## 2015-10-03 DIAGNOSIS — Z888 Allergy status to other drugs, medicaments and biological substances status: Secondary | ICD-10-CM

## 2015-10-03 DIAGNOSIS — Z9841 Cataract extraction status, right eye: Secondary | ICD-10-CM

## 2015-10-03 DIAGNOSIS — I359 Nonrheumatic aortic valve disorder, unspecified: Secondary | ICD-10-CM | POA: Diagnosis present

## 2015-10-03 DIAGNOSIS — G8194 Hemiplegia, unspecified affecting left nondominant side: Secondary | ICD-10-CM | POA: Diagnosis not present

## 2015-10-03 DIAGNOSIS — Z9842 Cataract extraction status, left eye: Secondary | ICD-10-CM | POA: Diagnosis not present

## 2015-10-03 DIAGNOSIS — Z85048 Personal history of other malignant neoplasm of rectum, rectosigmoid junction, and anus: Secondary | ICD-10-CM

## 2015-10-03 DIAGNOSIS — I6350 Cerebral infarction due to unspecified occlusion or stenosis of unspecified cerebral artery: Secondary | ICD-10-CM | POA: Insufficient documentation

## 2015-10-03 DIAGNOSIS — H409 Unspecified glaucoma: Secondary | ICD-10-CM | POA: Diagnosis present

## 2015-10-03 DIAGNOSIS — H9190 Unspecified hearing loss, unspecified ear: Secondary | ICD-10-CM | POA: Diagnosis present

## 2015-10-03 DIAGNOSIS — H3410 Central retinal artery occlusion, unspecified eye: Secondary | ICD-10-CM | POA: Diagnosis present

## 2015-10-03 DIAGNOSIS — R000 Tachycardia, unspecified: Secondary | ICD-10-CM | POA: Insufficient documentation

## 2015-10-03 DIAGNOSIS — Z880 Allergy status to penicillin: Secondary | ICD-10-CM | POA: Diagnosis not present

## 2015-10-03 DIAGNOSIS — Z882 Allergy status to sulfonamides status: Secondary | ICD-10-CM | POA: Diagnosis not present

## 2015-10-03 DIAGNOSIS — R29710 NIHSS score 10: Secondary | ICD-10-CM | POA: Diagnosis present

## 2015-10-03 DIAGNOSIS — R4781 Slurred speech: Secondary | ICD-10-CM | POA: Diagnosis present

## 2015-10-03 DIAGNOSIS — I69391 Dysphagia following cerebral infarction: Secondary | ICD-10-CM | POA: Diagnosis not present

## 2015-10-03 DIAGNOSIS — I639 Cerebral infarction, unspecified: Principal | ICD-10-CM | POA: Insufficient documentation

## 2015-10-03 DIAGNOSIS — R471 Dysarthria and anarthria: Secondary | ICD-10-CM | POA: Diagnosis present

## 2015-10-03 DIAGNOSIS — I739 Peripheral vascular disease, unspecified: Secondary | ICD-10-CM | POA: Diagnosis present

## 2015-10-03 DIAGNOSIS — Z7982 Long term (current) use of aspirin: Secondary | ICD-10-CM | POA: Diagnosis not present

## 2015-10-03 DIAGNOSIS — H548 Legal blindness, as defined in USA: Secondary | ICD-10-CM | POA: Diagnosis not present

## 2015-10-03 DIAGNOSIS — I6789 Other cerebrovascular disease: Secondary | ICD-10-CM

## 2015-10-03 DIAGNOSIS — M549 Dorsalgia, unspecified: Secondary | ICD-10-CM | POA: Diagnosis present

## 2015-10-03 DIAGNOSIS — I63311 Cerebral infarction due to thrombosis of right middle cerebral artery: Secondary | ICD-10-CM

## 2015-10-03 DIAGNOSIS — Z885 Allergy status to narcotic agent status: Secondary | ICD-10-CM | POA: Diagnosis not present

## 2015-10-03 DIAGNOSIS — I10 Essential (primary) hypertension: Secondary | ICD-10-CM | POA: Diagnosis not present

## 2015-10-03 DIAGNOSIS — I952 Hypotension due to drugs: Secondary | ICD-10-CM | POA: Diagnosis not present

## 2015-10-03 DIAGNOSIS — C180 Malignant neoplasm of cecum: Secondary | ICD-10-CM | POA: Diagnosis present

## 2015-10-03 DIAGNOSIS — G459 Transient cerebral ischemic attack, unspecified: Secondary | ICD-10-CM

## 2015-10-03 DIAGNOSIS — I6329 Cerebral infarction due to unspecified occlusion or stenosis of other precerebral arteries: Secondary | ICD-10-CM | POA: Diagnosis not present

## 2015-10-03 LAB — I-STAT CHEM 8, ED
BUN: 16 mg/dL (ref 6–20)
CALCIUM ION: 1.1 mmol/L — AB (ref 1.12–1.23)
CHLORIDE: 106 mmol/L (ref 101–111)
Creatinine, Ser: 0.7 mg/dL (ref 0.44–1.00)
GLUCOSE: 93 mg/dL (ref 65–99)
HCT: 37 % (ref 36.0–46.0)
Hemoglobin: 12.6 g/dL (ref 12.0–15.0)
Potassium: 4.3 mmol/L (ref 3.5–5.1)
SODIUM: 140 mmol/L (ref 135–145)
TCO2: 25 mmol/L (ref 0–100)

## 2015-10-03 LAB — I-STAT TROPONIN, ED: TROPONIN I, POC: 0 ng/mL (ref 0.00–0.08)

## 2015-10-03 LAB — COMPREHENSIVE METABOLIC PANEL
ALBUMIN: 3.3 g/dL — AB (ref 3.5–5.0)
ALT: 10 U/L — AB (ref 14–54)
AST: 31 U/L (ref 15–41)
Alkaline Phosphatase: 56 U/L (ref 38–126)
Anion gap: 6 (ref 5–15)
BUN: 15 mg/dL (ref 6–20)
CHLORIDE: 107 mmol/L (ref 101–111)
CO2: 24 mmol/L (ref 22–32)
CREATININE: 0.7 mg/dL (ref 0.44–1.00)
Calcium: 8.8 mg/dL — ABNORMAL LOW (ref 8.9–10.3)
GFR calc Af Amer: >60 mL/min (ref >60)
GFR calc non Af Amer: >60 mL/min (ref >60)
GLUCOSE: 94 mg/dL (ref 65–99)
POTASSIUM: 4.2 mmol/L (ref 3.5–5.1)
SODIUM: 137 mmol/L (ref 135–145)
Total Bilirubin: 0.7 mg/dL (ref 0.3–1.2)
Total Protein: 7 g/dL (ref 6.5–8.1)

## 2015-10-03 LAB — CBC
HEMATOCRIT: 38.7 % (ref 36.0–46.0)
Hemoglobin: 12.5 g/dL (ref 12.0–15.0)
MCH: 29.1 pg (ref 26.0–34.0)
MCHC: 32.3 g/dL (ref 30.0–36.0)
MCV: 90 fL (ref 78.0–100.0)
PLATELETS: 235 10*3/uL (ref 150–400)
RBC: 4.3 MIL/uL (ref 3.87–5.11)
RDW: 14.2 % (ref 11.5–15.5)
WBC: 8.2 10*3/uL (ref 4.0–10.5)

## 2015-10-03 LAB — DIFFERENTIAL
BASOS ABS: 0 10*3/uL (ref 0.0–0.1)
BASOS PCT: 0 %
EOS ABS: 0.3 10*3/uL (ref 0.0–0.7)
Eosinophils Relative: 3 %
Lymphocytes Relative: 40 %
Lymphs Abs: 3.2 10*3/uL (ref 0.7–4.0)
MONOS PCT: 8 %
Monocytes Absolute: 0.7 10*3/uL (ref 0.1–1.0)
NEUTROS ABS: 4 10*3/uL (ref 1.7–7.7)
NEUTROS PCT: 49 %

## 2015-10-03 LAB — ECHOCARDIOGRAM COMPLETE: WEIGHTICAEL: 2091.72 [oz_av]

## 2015-10-03 LAB — APTT: APTT: 26 s (ref 24–37)

## 2015-10-03 LAB — PROTIME-INR
INR: 1.02 (ref 0.00–1.49)
Prothrombin Time: 13.6 seconds (ref 11.6–15.2)

## 2015-10-03 LAB — CBG MONITORING, ED: GLUCOSE-CAPILLARY: 85 mg/dL (ref 65–99)

## 2015-10-03 MED ORDER — ASPIRIN 325 MG PO TABS
325.0000 mg | ORAL_TABLET | Freq: Every day | ORAL | Status: DC
  Administered 2015-10-03: 325 mg via ORAL
  Filled 2015-10-03: qty 1

## 2015-10-03 MED ORDER — SENNOSIDES-DOCUSATE SODIUM 8.6-50 MG PO TABS: 1.0000 | ORAL_TABLET | Freq: Every evening | ORAL | Status: DC | PRN

## 2015-10-03 MED ORDER — STROKE: EARLY STAGES OF RECOVERY BOOK
Freq: Once | Status: AC
  Administered 2015-10-03: 21:00:00
  Filled 2015-10-03: qty 1

## 2015-10-03 MED ORDER — BRIMONIDINE TARTRATE 0.15 % OP SOLN
1.0000 [drp] | Freq: Three times a day (TID) | OPHTHALMIC | Status: DC
  Administered 2015-10-03 – 2015-10-05 (×7): 1 [drp] via OPHTHALMIC
  Filled 2015-10-03: qty 5

## 2015-10-03 MED ORDER — IOPAMIDOL (ISOVUE-370) INJECTION 76%
INTRAVENOUS | Status: AC
  Filled 2015-10-03: qty 50

## 2015-10-03 MED ORDER — CLOPIDOGREL BISULFATE 75 MG PO TABS
75.0000 mg | ORAL_TABLET | Freq: Every day | ORAL | Status: DC
  Administered 2015-10-04 – 2015-10-05 (×2): 75 mg via ORAL
  Filled 2015-10-03 (×2): qty 1

## 2015-10-03 MED ORDER — ALBUMIN HUMAN 5 % IV SOLN
12.5000 g | Freq: Four times a day (QID) | INTRAVENOUS | Status: AC
  Administered 2015-10-03: 12.5 g via INTRAVENOUS
  Filled 2015-10-03 (×2): qty 250

## 2015-10-03 MED ORDER — ALBUMIN HUMAN 5 % IV SOLN
12.5000 g | Freq: Four times a day (QID) | INTRAVENOUS | Status: DC
  Administered 2015-10-03: 12.5 g via INTRAVENOUS
  Filled 2015-10-03 (×2): qty 250

## 2015-10-03 MED ORDER — DORZOLAMIDE HCL-TIMOLOL MAL 2-0.5 % OP SOLN
1.0000 [drp] | Freq: Three times a day (TID) | OPHTHALMIC | Status: DC
  Filled 2015-10-03: qty 10

## 2015-10-03 MED ORDER — ENOXAPARIN SODIUM 40 MG/0.4ML ~~LOC~~ SOLN
40.0000 mg | SUBCUTANEOUS | Status: DC
  Administered 2015-10-03 – 2015-10-05 (×3): 40 mg via SUBCUTANEOUS
  Filled 2015-10-03 (×3): qty 0.4

## 2015-10-03 MED ORDER — LATANOPROST 0.005 % OP SOLN
1.0000 [drp] | Freq: Every day | OPHTHALMIC | Status: DC
  Administered 2015-10-03 – 2015-10-04 (×2): 1 [drp] via OPHTHALMIC
  Filled 2015-10-03: qty 2.5

## 2015-10-03 MED ORDER — HYDRALAZINE HCL 20 MG/ML IJ SOLN: 5.0000 mg | INTRAMUSCULAR | Status: DC | PRN

## 2015-10-03 MED ORDER — HYDRALAZINE HCL 20 MG/ML IJ SOLN
5.0000 mg | INTRAMUSCULAR | Status: DC | PRN
  Administered 2015-10-03: 5 mg via INTRAVENOUS
  Filled 2015-10-03: qty 1

## 2015-10-03 MED ORDER — SODIUM CHLORIDE 0.9 % IV SOLN
INTRAVENOUS | Status: AC
  Administered 2015-10-03: 12:00:00 via INTRAVENOUS

## 2015-10-03 MED ORDER — ASPIRIN 300 MG RE SUPP
300.0000 mg | Freq: Every day | RECTAL | Status: DC
  Filled 2015-10-03: qty 1

## 2015-10-03 NOTE — Progress Notes (Addendum)
STROKE TEAM PROGRESS NOTE   HISTORY OF PRESENT ILLNESS (per record) Karen [name] is a 82 y.o. female who went to bed in her normal state of health around 10 PM, and then when she awoke she try to get up and go to the bathroom and she noticed that her left leg was giving way. She called 911 and a code stroke was activated. She denies any sensation changes, does not notice any visual change.  She continues to live at home.  LKW: 10 PM tpa given?: no, outside of window   SUBJECTIVE (INTERVAL HISTORY) Her two daughters and son is at the bedside.  She has left sided weakness but still able to squeeze hand and lift up left leg this am. However, due to high SBP at 225, she received hydralazine IV, and her SBP down to 123. About one hour later, she was sitting with speech evaluation, started to have worsening neuro status, left side became hemiplegic. Put her back to bed, trendelenburg position, IVF bolus, sBP up to 145, left leg able to lift again and left arm also able to move but still weaker that before, started albumin 12.5g Q6h x 4.    OBJECTIVE Temp:  [97.6 F (36.4 C)-98.1 F (36.7 C)] 97.6 F (36.4 C) (07/09 0752) Pulse Rate:  [57-68] 57 (07/09 0752) Cardiac Rhythm:  [-] Heart block (07/09 0800) Resp:  [15-16] 15 (07/09 0721) BP: (198-227)/(65-71) 227/65 mmHg (07/09 0752) SpO2:  [95 %-100 %] 100 % (07/09 0752) Weight:  [59.3 kg (130 lb 11.7 oz)] 59.3 kg (130 lb 11.7 oz) (07/09 0541)  CBC:   Recent Labs Lab 10/03/15 0501 10/03/15 0518  WBC 8.2  --   NEUTROABS 4.0  --   HGB 12.5 12.6  HCT 38.7 37.0  MCV 90.0  --   PLT 235  --     Basic Metabolic Panel:   Recent Labs Lab 10/03/15 0501 10/03/15 0518  NA 137 140  K 4.2 4.3  CL 107 106  CO2 24  --   GLUCOSE 94 93  BUN 15 16  CREATININE 0.70 0.70  CALCIUM 8.8*  --     Lipid Panel:     Component Value Date/Time   CHOL 171 10/31/2007 0926   TRIG 143 10/31/2007 0926   HDL 30.2* 10/31/2007 0926   CHOLHDL 5.7  CALC 10/31/2007 0926   VLDL 29 10/31/2007 0926   LDLCALC 112* 10/31/2007 0926   HgbA1c:  Lab Results  Component Value Date   HGBA1C 6.1 01/29/2013   Urine Drug Screen: No results found for: LABOPIA, COCAINSCRNUR, LABBENZ, AMPHETMU, THCU, LABBARB    IMAGING I have personally reviewed the radiological images below and agree with the radiology interpretations.  Ct Angio Head and Neck W Or Wo Contrast 10/03/2015   CT PERFUSION:   No large vascular territory perfusion abnormality.  CTA NECK:  Atherosclerosis without hemodynamically significant stenosis or acute vascular process. 10 mm carinal lymph node may be re- active, incompletely evaluated.  CTA HEAD:   No emergent large vessel occlusion or severe stenosis. Moderate luminal irregularity of the cerebral arteries compatible with atherosclerosis.  Ct Head Code Stroke W/o Cm 10/03/2015   1. No acute intracranial pathology seen on CT.  2. Mild cortical volume loss and scattered small vessel ischemic microangiopathy.   MRI Brain Wo Contrast 10/03/2015  IMPRESSION: 1. Acute lacunar type infarct tracking from the posterior right corona radiata to the posterior lentiform nuclei. No associated hemorrhage or mass effect. 2. Underlying chronic  small vessel disease, moderate for age.  TTE pending    PHYSICAL EXAM  Temp:  [97.6 F (36.4 C)-98.1 F (36.7 C)] 98 F (36.7 C) (07/09 1200) Pulse Rate:  [57-74] 68 (07/09 1545) Resp:  [15-18] 16 (07/09 1545) BP: (123-227)/(46-71) 138/64 mmHg (07/09 1545) SpO2:  [95 %-100 %] 100 % (07/09 1545) Weight:  [130 lb 11.7 oz (59.3 kg)] 130 lb 11.7 oz (59.3 kg) (07/09 0541)  General - Well nourished, well developed, in no apparent distress.  Ophthalmologic - Fundi not visualized due to noncooperation.  Cardiovascular - Regular rate and rhythm.  Mental Status -  Level of arousal and orientation to time, place, and person were intact. Language including expression, naming, repetition, comprehension  was assessed and found intact. Fund of Knowledge was assessed and was intact.  Cranial Nerves II - XII - II - Visual field intact OU, but decreased visual acuity to [facility] biaterally. III, IV, VI - Extraocular movements intact. V - Facial sensation intact bilaterally. VII - left facial mild droop. VIII - Hearing & vestibular intact bilaterally. X - Palate elevates symmetrically. XI - Chin turning & shoulder shrug intact bilaterally. XII - Tongue protrusion intact.  Motor Strength - The patient's strength was 5/5 RUE and 4/5 RLE, but 3/5 LLE and 2/5 LUE.  Bulk was normal and fasciculations were absent.   Motor Tone - Muscle tone was assessed at the neck and appendages and was normal.  Reflexes - The patient's reflexes were 1+ in all extremities and she had no pathological reflexes.  Sensory - Light touch, temperature/pinprick were assessed and were symmetrical.    Coordination - The patient had normal movements in the right hand with no ataxia or dysmetria.  Tremor was absent.  Gait and Station - not tested due to weakness.    ASSESSMENT/PLAN Karen [name] is a 82 y.o. female with history of hypertension, colon cancer 10 years ago, previous stroke, legally blind bilaterally due to glaucoma,  presenting with left sided weakness. She did not receive IV t-PA due to late presentation.  Stroke:  right BG/CR infarct likely secondary to small vessel disease source. Pt developed left hemiplegia not long after hydralazine which bring SBP from 225 to 123. Received IVF bolus and albumin 12.5g Q6h x 4, symptoms improving.   Resultant  Left hemiparesis  MRI right BG/CR infarct  CTA H&N - b/l fetal PCAs, diminutive BA and b/l BVJs, and b/l VA origin atherosclerosis  CT perfusion scan - no large perfusion abnormality.  2D Echo - pending  LDL - pending  HgbA1c pending  VTE prophylaxis - Lovenox Diet NPO time specified  aspirin 81 mg daily prior to admission, now on aspirin 300 mg  suppository daily. Recommend to change to plavix for stroke prevention.   Patient counseled to be compliant with her antithrombotic medications  Ongoing aggressive stroke risk factor management  Therapy recommendations: Pending  Disposition:  Pending  Pt likely to be a STROKE AF trial candidate once TTE ruled out cardioembolic source. Will after Dr. Leonie Man to follow up on this once TEE report available.  Hypotension and Hypertension  Systolic blood pressure running high up to 225.  Received hydralazine x 1 and SBP down to 123 - with pt neuro worsening  Received IVF bolus and albumin 12.5g Q6 x 4. SBP and symptoms improving.  Permissive hypertension (OK if < 220/120) but gradually normalize in 5-7 days  Long-term BP goal normotensive  Hyperlipidemia  Home meds: No lipid lowering medications prior  to admission  LDL pending, goal < 70  Other Stroke Risk Factors  Advanced age  Hx stroke/TIA  Other Active Problems  Legally blind  B/l glaucoma  Colon cancer - cured 10 years ago  Hospital day # 0  The patient had neuro worsening with left hemiplegia after abrupt BP decrease due to hydralazine use. She was given IVF bolus, albumin administration and trendelenburg position management. She is at risk for recurrent strokes and neurological worsening and she needs ongoing stroke evaluation and aggressive risk factor modification.   Rosalin Hawking, MD PhD Stroke Neurology 10/03/2015 4:17 PM      To contact Stroke Continuity provider, please refer to [email or web address] After hours, contact General Neurology

## 2015-10-03 NOTE — Plan of Care (Signed)
Came back to check on her again. She is sleeping but easily arousable. BP 179/66. LUE 3/5 proximal and distal 4/5. LLE 4-/5 now. Much improved from prior. Finished one dose of albumin. Will continue to another dose of albumin in 6 hours. Increase IVF to 75cc/h. Changed BP parameter to permissive hypertension < 220/120. Will continue to monitor.   Rosalin Hawking, MD PhD Stroke Neurology 10/03/2015 5:13 PM

## 2015-10-03 NOTE — Progress Notes (Signed)
  Received sign out regarding patient from Dr. Roxanne Mins 82 year old with left-sided weakness and dysarthria CT of the brain showing no acute abnormality. Symptoms appear to be resolving. Patient not a candidate for TPA or IR. BPs as high as 219/67. Admitting to a telemetry bed.

## 2015-10-03 NOTE — Consult Note (Signed)
Neurology Consultation Reason for Consult: Left-sided weakness Referring Physician: Roxanne Mins, D  CC: Left-sided weakness  History is obtained from: Patient  HPI: Karen [name] is a 82 y.o. female who went to bed in her normal state of health around 10 PM, and then when she awoke she try to get up and go to the bathroom and she noticed that her left leg was giving way. She called 911 and a code stroke was activated. She denies any sensation changes, does not notice any visual change.  She continues to live at home.  LKW: 10 PM tpa given?: no, outside of window    ROS: A 14 point ROS was performed and is negative except as noted in the HPI.   Past Medical History  Diagnosis Date  . ANEMIA 10/21/2008  . AORTIC INSUFFICIENCY 11/26/2009  . BACK PAIN 06/27/2007  . COLON CANCER, PERSONAL HX 02/25/2010  . Diverticulosis of colon (without mention of hemorrhage) 08/14/2006  . GALLSTONES 10/09/2008  . GLAUCOMA NOS 08/14/2006  . HYPERTENSION 08/14/2006  . HYPOKALEMIA, HX OF 11/26/2009  . MELENA, HX OF 10/12/2008  . NEOPLASM, MALIGNANT, COLON, CECUM 10/30/2008  . OCCLUSION, CENTRAL RETINAL ARTERY 08/14/2006  . PERSONAL HX COLONIC POLYPS 10/21/2008  . SYNCOPE, HX OF 08/14/2006  . URI 09/07/2008  . H/O: stroke     behind right eye     Family History  Problem Relation Age of Onset  . Stroke Neg Hx   . Heart disease Neg Hx   . Breast cancer Sister   . Heart attack Father   . Lung cancer Brother      Social History:  reports that she has never smoked. She has never used smokeless tobacco. She reports that she does not drink alcohol or use illicit drugs.   Exam: Current vital signs: BP 219/67 mmHg  Pulse 61  Temp(Src) 98.1 F (36.7 C) (Oral)  Resp 16  Wt 59.3 kg (130 lb 11.7 oz)  SpO2 97% Vital signs in last 24 hours: Temp:  [98.1 F (36.7 C)] 98.1 F (36.7 C) (07/09 0541) Pulse Rate:  [61-68] 61 (07/09 0623) Resp:  [16] 16 (07/09 0623) BP: (198-219)/(67-71) 219/67 mmHg (07/09  0623) SpO2:  [95 %-97 %] 97 % (07/09 0623) Weight:  [59.3 kg (130 lb 11.7 oz)] 59.3 kg (130 lb 11.7 oz) (07/09 0541)   Physical Exam  Constitutional: Appears well-developed and well-nourished.  Psych: Affect appropriate to situation Eyes: No scleral injection HENT: No OP obstrucion Head: Normocephalic.  Cardiovascular: Normal rate and regular rhythm.  Respiratory: Effort normal and breath sounds normal to anterior ascultation GI: Soft.  No distension. There is no tenderness.  Skin: WDI  Neuro: Mental Status: Patient is awake, alert, oriented to person, place, month, year, and situation. Patient is able to give a clear and coherent history. No signs of aphasia or neglect Cranial Nerves: II: She has complete loss of vision in her left eye, and her right eye she has markedly decreased nasal field. She has surgical changes bilaterally in the pupils, though does have some reaction in the right pupil. III,IV, VI: EOMI without ptosis or diploplia.  V: Facial sensation is symmetric to temperature VII: Facial movement is slightly weak on the left VIII: hearing is intact to voice X: Uvula elevates symmetrically XI: Shoulder shrug is symmetric. XII: tongue is midline without atrophy or fasciculations.  Motor: Tone is normal. Bulk is normal. 5/5 strength was present on the right side, she has 3/5 weakness of the left arm  4 minus/5 weakness of the left leg Sensory: There is questionable decreased pin in the left arm both slightly inconsistent answers Cerebellar: No clear ataxia on the right  I have reviewed labs in epic and the results pertinent to this consultation are: Normal creatinine  I have reviewed the images obtained: CT perfusion-negative for large vessel, intervenable lesion  Impression: 82 year old female with new onset left-sided hemiparesis. A strong suspect that she has had a small ischemic stroke and she will need to be admitted for physical therapy and further  workup.  Recommendations: 1. HgbA1c, fasting lipid panel 2. MRI, MRA  of the brain without contrast 3. Frequent neuro checks 4. Echocardiogram 5. Carotid dopplers 6. Prophylactic therapy-Antiplatelet med: Aspirin - dose 325mg  PO or 300mg  PR 7. Risk factor modification 8. Telemetry monitoring 9. PT consult, OT consult, Speech consult 10. please page stroke NP  Or  PA  Or MD  M-F from 8am -4 pm starting 7/9 as this patient will be followed by the stroke team at this point.   You can look them up on [email or web address]     Roland Rack, MD [facility] [phone]  If 7pm- 7am, please page neurology on call as listed in [place].

## 2015-10-03 NOTE — Progress Notes (Deleted)
Occupational Therapy Evaluation Patient Details Name: Karen [name] MRN: [id number] DOB: 1922-08-27 Today's Date: 10/03/2015    History of Present Illness 82 y.o. female admitted for LUE weakness and difficulty walking. MRI (-) for acute event. PMH significant for HTN, anemia, aortic insufficiency, colon cancer, hypokalemia, syncope, CVA (behind R eye), and back pain.   Clinical Impression   PTA, pt was independent with ADLs and uses [facility] for mobility. Pt currently presents with short-term memory deficits, generalized weakness, and balance deficits and required min assist for LB ADLs and basic transfers. Pt expressed concerns with returning home upon d/c and would really like to move to ILF without her husband. CM notified and SW consult ordered. Pt will benefit from continued acute OT to increase independence and safety with ADLs and mobility to allow for safe discharge home. Recommend HHOT and 3in1 for home use.    Follow Up Recommendations  Home health OT;Supervision/Assistance - 24 hour    Equipment Recommendations  3 in 1 bedside comode    Recommendations for Other Services Other (comment) (SW consult)     Precautions / Restrictions Precautions Precautions: Fall Restrictions Weight Bearing Restrictions: No      Mobility Bed Mobility Overal bed mobility: Modified Independent                Transfers Overall transfer level: Needs assistance Equipment used: None Transfers: Sit to/from Stand Sit to Stand: Min assist         General transfer comment: Initially pt required min guard assist for sit-stand transfers. However, halfway through session began to have several LOB and increased unsteadiness requiring min assist to maintain safety. VCs for safe hand placement.    Balance Overall balance assessment: Needs assistance Sitting-balance support: No upper extremity supported;Feet supported Sitting balance-Leahy Scale: Good     Standing balance support: No upper  extremity supported;During functional activity Standing balance-Leahy Scale: Poor Standing balance comment: multiple LOB that required assistance to regain balance                            ADL Overall ADL's : Needs assistance/impaired     Grooming: Wash/dry hands;Oral care;Minimal assistance;Standing Grooming Details (indicate cue type and reason): assist for LOB Upper Body Bathing: Set up;Sitting   Lower Body Bathing: Minimal assistance;Sit to/from stand   Upper Body Dressing : Set up;Sitting   Lower Body Dressing: Minimal assistance;Sit to/from stand   Toilet Transfer: Minimal assistance;Cueing for safety;Ambulation;Regular Museum/gallery exhibitions officer and Hygiene: Min guard;Sit to/from stand       Functional mobility during ADLs: Minimal assistance;Cueing for safety General ADL Comments: Assist for balance and safety. Pt with memory deficits and requires some cues to redirect back to task     Vision Vision Assessment?: No apparent visual deficits   Perception     Praxis      Pertinent Vitals/Pain Pain Assessment: 0-10 Pain Score: 4  Pain Location: headache Pain Descriptors / Indicators: Headache Pain Intervention(s): Monitored during session;Repositioned     Hand Dominance Right   Extremity/Trunk Assessment Upper Extremity Assessment Upper Extremity Assessment: Overall WFL for tasks assessed (overall 4/5 strength)   Lower Extremity Assessment Lower Extremity Assessment: Generalized weakness   Cervical / Trunk Assessment Cervical / Trunk Assessment: Kyphotic   Communication Communication Communication: No difficulties   Cognition Arousal/Alertness: Awake/alert Behavior During Therapy: WFL for tasks assessed/performed Overall Cognitive Status: No family/caregiver present to determine baseline cognitive functioning Area of Impairment:  Memory;Safety/judgement;Problem solving     Memory: Decreased short-term memory    Safety/Judgement: Decreased awareness of safety;Decreased awareness of deficits   Problem Solving: Slow processing;Difficulty sequencing;Requires verbal cues General Comments: Pt generally a bit forgetful and often forgets her train of thought in mid-sentence. However, pt is aware of this and attributes it to her serotonin disorder for which she is being treated.   General Comments       Exercises       Shoulder Instructions      Home Living Family/patient expects to be discharged to:: Private residence Living Arrangements: Spouse/significant other Available Help at Discharge: Family;Available 24 hours/day Type of Home: House Home Access: Stairs to enter [employer] of Steps: 1 Entrance Stairs-Rails: None Home Layout: One level     Bathroom Shower/Tub: Tub/shower unit Shower/tub characteristics: Curtain Biochemist, clinical: Standard     Home Equipment: Environmental consultant - 2 wheels;Cane - single point;Shower seat;Adaptive equipment Adaptive Equipment: Reacher Additional Comments: Pt expressed interest in moving to [place] soon (preferably [place] in [place]). Pt reports her husband is not a very nice man and doesn't like to go out and socialize like she does and would prefer to move without him. CM notified and SW consult put in for pt to receive more information about this.      Prior Functioning/Environment Level of Independence: Independent with assistive device(s)        Comments: Pt reports she furniture walks in house and uses SPC or RW for community mobility. Pt goes to the gym x4/week and is independent with all ADLs and IADLs.    OT Diagnosis: Generalized weakness;Cognitive deficits;Acute pain   OT Problem List: Decreased strength;Decreased activity tolerance;Impaired balance (sitting and/or standing);Decreased coordination;Decreased knowledge of use of DME or AE;Decreased safety awareness;Pain   OT Treatment/Interventions: Self-care/ADL training;Therapeutic  exercise;DME and/or AE instruction;Therapeutic activities;Patient/family education;Balance training    OT Goals(Current goals can be found in the care plan section) Acute Rehab OT Goals Patient Stated Goal: to go to ILF OT Goal Formulation: With patient Time For Goal Achievement: 10/17/15 Potential to Achieve Goals: Good ADL Goals Pt Will Perform Grooming: with modified independence;standing Pt Will Perform Lower Body Bathing: with modified independence;sit to/from stand Pt Will Perform Lower Body Dressing: with modified independence;sit to/from stand Pt Will Transfer to Toilet: with modified independence;ambulating;bedside commode (over toilet) Pt Will Perform Toileting - Clothing Manipulation and hygiene: with modified independence;sit to/from stand  OT Frequency: Min 2X/week   Barriers to D/C: Decreased caregiver support  Pt's husband is unable to provide any kind of assistance       Co-evaluation              End of Session Equipment Utilized During Treatment: Gait belt Nurse Communication: Mobility status  Activity Tolerance: Patient tolerated treatment well Patient left: in bed;with call bell/phone within reach;with bed alarm set   Time: 1533-1606 OT Time Calculation (min): 33 min Charges:  OT General Charges $OT Visit: 1 Procedure OT Evaluation $OT Eval Moderate Complexity: 1 Procedure OT Treatments $Self Care/Home Management : 8-22 mins G-Codes:    Redmond Baseman, OTR/L Pager: [phone] 10/03/2015, 4:30 PM

## 2015-10-03 NOTE — Progress Notes (Signed)
  Echocardiogram 2D Echocardiogram has been performed.  Darlina Sicilian M 10/03/2015, 3:43 PM

## 2015-10-03 NOTE — ED Notes (Signed)
Pt arrives via EMS with c/o L sided weakness and dysarthria onset upon waking at [place].

## 2015-10-03 NOTE — ED Notes (Signed)
Code Stroke called in field. Page went out @ 0441.

## 2015-10-03 NOTE — Progress Notes (Signed)
Speech Language Pathology Treatment: Dysphagia  Patient Details Name: Karen [name] MRN: [id number] DOB: May 25, 1922 Today's Date: 10/03/2015 Time: 1445-1500 SLP Time Calculation (min) (ACUTE ONLY): 15 min  Assessment / Plan / Recommendation Clinical Impression  SLP called to visit pt again today following acute but brief change in status per RN. Pt continues to tolerate thin liquids, but did struggle a bit more with mastication of solids with left buccal residual noted with soft solids, liquid wash needed to clear as well as verbal cues for lingual sweep. Will downgrade diet to dys 2/thin and follow for tolerance.    HPI HPI: Karen [name] is a 82 y.o. female with a Past Medical History of HTN, glaucoma. She was normal last night, playing cards with family and then woke up in the middle of the night with the inability to move left arm and left leg weakness. Family brought her to the ER. She at baseline is blind and walks with a cane, lives with family.She also reports "choking" on some food last week, and so she immediately failed the RN stroke swallow.       SLP Plan  Continue with current plan of care     Recommendations  Diet recommendations: Dysphagia 2 (fine chop);Thin liquid Liquids provided via: Cup;Straw Medication Administration: Whole meds with liquid Supervision: Staff to assist with self feeding Compensations: Slow rate;Small sips/bites             Follow up Recommendations: Inpatient Rehab Plan: Continue with current plan of care     GO               [facility], MA CCC-SLP [id number]  Lynann Beaver 10/03/2015, 3:21 PM

## 2015-10-03 NOTE — ED Provider Notes (Signed)
CSN: [id number]     Arrival date & time 10/03/15  0502 History   First MD Initiated Contact with Patient 10/03/15 [phone]     Chief Complaint  Patient presents with  . Code Stroke     (Consider location/radiation/quality/duration/timing/severity/associated sxs/prior Treatment) The history is provided by the patient.  82 year old female woke up at about 4:30 AM and noted that she was not able to use her left arm. Family noted she was not able to stand. She was last seen normal when she went to bed at 10:45 PM. She denies any headache. She denies chest pain. There is no nausea or vomiting. She is brought by ambulance and arrived as a code stroke. She does have history of hypertension and prior history of stroke.  Past Medical History  Diagnosis Date  . ANEMIA 10/21/2008  . AORTIC INSUFFICIENCY 11/26/2009  . BACK PAIN 06/27/2007  . COLON CANCER, PERSONAL HX 02/25/2010  . Diverticulosis of colon (without mention of hemorrhage) 08/14/2006  . GALLSTONES 10/09/2008  . GLAUCOMA NOS 08/14/2006  . HYPERTENSION 08/14/2006  . HYPOKALEMIA, HX OF 11/26/2009  . MELENA, HX OF 10/12/2008  . NEOPLASM, MALIGNANT, COLON, CECUM 10/30/2008  . OCCLUSION, CENTRAL RETINAL ARTERY 08/14/2006  . PERSONAL HX COLONIC POLYPS 10/21/2008  . SYNCOPE, HX OF 08/14/2006  . URI 09/07/2008  . H/O: stroke     behind right eye   Past Surgical History  Procedure Laterality Date  . Appendectomy    . Cataract extraction      bilateral  . Dilation and curettage of uterus    . Tonsillectomy    . Colon surgery      11/20/2008,gallbladder removed  . Ankle fracture surgery  2011    right   Family History  Problem Relation Age of Onset  . Stroke Neg Hx   . Heart disease Neg Hx   . Breast cancer Sister   . Heart attack Father   . Lung cancer Brother    Social History  Substance Use Topics  . Smoking status: Never Smoker   . Smokeless tobacco: Never Used  . Alcohol Use: No   OB History    No data available     Review of Systems   All other systems reviewed and are negative.     Allergies  Morphine and related; Sulfa antibiotics; Amoxicillin; Ciprofloxacin; Methocarbamol; and Trimpex  Home Medications   Prior to Admission medications   Medication Sig Start Date End Date Taking? Authorizing Provider  aspirin 81 MG tablet Take 1 tablet (81 mg total) by mouth daily. 12/05/10   Marletta Lor, MD  atenolol (TENORMIN) 50 MG tablet Take 1 tablet (50 mg total) by mouth daily. 06/29/15   Marletta Lor, MD  bimatoprost (LUMIGAN) 0.01 % SOLN Place 1 drop into the right eye at bedtime.    Historical Provider, MD  brimonidine (ALPHAGAN P) 0.1 % SOLN Place 1 drop into both eyes every 8 (eight) hours.     Historical Provider, MD  Calcium Carb-Cholecalciferol (CALCIUM 600/VITAMIN D3) 600-800 MG-UNIT TABS Take 1 tablet by mouth daily.    Historical Provider, MD  dorzolamide-timolol (COSOPT) 22.3-6.8 MG/ML ophthalmic solution Place 1 drop into both eyes 3 (three) times daily.     Historical Provider, MD  escitalopram (LEXAPRO) 10 MG tablet TAKE 1 TABLET (10 MG TOTAL) BY MOUTH DAILY. 08/09/15   Marletta Lor, MD  ibuprofen (ADVIL,MOTRIN) 200 MG tablet Take 600 mg by mouth every 6 (six) hours as needed (Pain).  Historical Provider, MD  vitamin C (ASCORBIC ACID) 500 MG tablet Take 500 mg by mouth daily.    Historical Provider, MD   BP 198/71 mmHg  Pulse 68  Temp(Src) 98.1 F (36.7 C) (Oral)  Resp 16  Wt 130 lb 11.7 oz (59.3 kg)  SpO2 95% Physical Exam  Nursing note and vitals reviewed.  82 year old female, resting comfortably and in no acute distress. Vital signs are significant for hypertension. Oxygen saturation is 95%, which is normal. Head is normocephalic and atraumatic. PERRLA, EOMI. Oropharynx is clear. Neck is nontender and supple without adenopathy or JVD. Back is nontender and there is no CVA tenderness. Lungs are clear without rales, wheezes, or rhonchi. Chest is nontender. Heart has regular rate  and rhythm without murmur. Abdomen is soft, flat, nontender without masses or hepatosplenomegaly and peristalsis is normoactive. Extremities have no cyanosis or edema, full range of motion is present. Skin is warm and dry without rash. Neurologic: She is awake, alert, oriented 3, speech is normal, cranial nerves are intact. There is moderate left arm weakness but no weakness of the left leg. Left arm strength is 3/5. Strength in all other extremities is 5/5.  ED Course  Procedures (including critical care time) Labs Review Results for orders placed or performed during the hospital encounter of 10/03/15  Protime-INR  Result Value Ref Range   Prothrombin Time 13.6 11.6 - 15.2 seconds   INR 1.02 0.00 - 1.49  APTT  Result Value Ref Range   aPTT 26 24 - 37 seconds  CBC  Result Value Ref Range   WBC 8.2 4.0 - 10.5 K/uL   RBC 4.30 3.87 - 5.11 MIL/uL   Hemoglobin 12.5 12.0 - 15.0 g/dL   HCT 38.7 36.0 - 46.0 %   MCV 90.0 78.0 - 100.0 fL   MCH 29.1 26.0 - 34.0 pg   MCHC 32.3 30.0 - 36.0 g/dL   RDW 14.2 11.5 - 15.5 %   Platelets 235 150 - 400 K/uL  Differential  Result Value Ref Range   Neutrophils Relative % 49 %   Neutro Abs 4.0 1.7 - 7.7 K/uL   Lymphocytes Relative 40 %   Lymphs Abs 3.2 0.7 - 4.0 K/uL   Monocytes Relative 8 %   Monocytes Absolute 0.7 0.1 - 1.0 K/uL   Eosinophils Relative 3 %   Eosinophils Absolute 0.3 0.0 - 0.7 K/uL   Basophils Relative 0 %   Basophils Absolute 0.0 0.0 - 0.1 K/uL  Comprehensive metabolic panel  Result Value Ref Range   Sodium 137 135 - 145 mmol/L   Potassium 4.2 3.5 - 5.1 mmol/L   Chloride 107 101 - 111 mmol/L   CO2 24 22 - 32 mmol/L   Glucose, Bld 94 65 - 99 mg/dL   BUN 15 6 - 20 mg/dL   Creatinine, Ser 0.70 0.44 - 1.00 mg/dL   Calcium 8.8 (L) 8.9 - 10.3 mg/dL   Total Protein 7.0 6.5 - 8.1 g/dL   Albumin 3.3 (L) 3.5 - 5.0 g/dL   AST 31 15 - 41 U/L   ALT 10 (L) 14 - 54 U/L   Alkaline Phosphatase 56 38 - 126 U/L   Total Bilirubin 0.7  0.3 - 1.2 mg/dL   GFR calc non Af Amer >60 >60 mL/min   GFR calc Af Amer >60 >60 mL/min   Anion gap 6 5 - 15  I-stat troponin, ED  Result Value Ref Range   Troponin i, poc 0.00 0.00 -  0.08 ng/mL   Comment 3          I-Stat Chem 8, ED  Result Value Ref Range   Sodium 140 135 - 145 mmol/L   Potassium 4.3 3.5 - 5.1 mmol/L   Chloride 106 101 - 111 mmol/L   BUN 16 6 - 20 mg/dL   Creatinine, Ser 0.70 0.44 - 1.00 mg/dL   Glucose, Bld 93 65 - 99 mg/dL   Calcium, Ion 1.10 (L) 1.12 - 1.23 mmol/L   TCO2 25 0 - 100 mmol/L   Hemoglobin 12.6 12.0 - 15.0 g/dL   HCT 37.0 36.0 - 46.0 %    Imaging Review Ct Head Code Stroke W/o Cm  10/03/2015  CLINICAL DATA:  Acute onset of left-sided weakness. Code stroke. Initial encounter. EXAM: CT HEAD WITHOUT CONTRAST TECHNIQUE: Contiguous axial images were obtained from the base of the skull through the vertex without intravenous contrast. COMPARISON:  CT of the head performed 11/07/2009 FINDINGS: There is no evidence of acute infarction, mass lesion, or intra- or extra-axial hemorrhage on CT. Prominence of the ventricles and sulci reflects mild cortical volume loss. Scattered periventricular and subcortical white matter change likely reflects small vessel ischemic microangiopathy. The brainstem and fourth ventricle are within normal limits. The basal ganglia are unremarkable in appearance. The cerebral hemispheres demonstrate grossly normal gray-white differentiation. No mass effect or midline shift is seen. There is no evidence of fracture; visualized osseous structures are unremarkable in appearance. Postoperative change is noted at the left optic globe. The right orbit is unremarkable in appearance. The paranasal sinuses and mastoid air cells are well-aerated. No significant soft tissue abnormalities are seen. IMPRESSION: 1. No acute intracranial pathology seen on CT. 2. Mild cortical volume loss and scattered small vessel ischemic microangiopathy. These results were  called by telephone at the time of interpretation on 10/03/2015 at 5:28 am to Dr. Roland Rack, who verbally acknowledged these results. Electronically Signed   By: Garald Balding M.D.   On: 10/03/2015 05:27   I have personally reviewed and evaluated these images and lab results as part of my medical decision-making.   EKG Interpretation ECG shows normal sinus rhythm with a rate of 62, no ectopy. Normal axis. Normal P wave. Normal QRS. Normal intervals. Normal ST and T waves. Impression: normal ECG. When compared with ECG of 03/16/2014, no significant changes are seen.     CRITICAL CARE Performed by: Delora Fuel Total critical care time: 35 minutes Critical care time was exclusive of separately billable procedures and treating other patients. Critical care was necessary to treat or prevent imminent or life-threatening deterioration. Critical care was time spent personally by me on the following activities: development of treatment plan with patient and/or surrogate as well as nursing, discussions with consultants, evaluation of patient's response to treatment, examination of patient, obtaining history from patient or surrogate, ordering and performing treatments and interventions, ordering and review of laboratory studies, ordering and review of radiographic studies, pulse oximetry and re-evaluation of patient's condition. MDM   Final diagnoses:  Cerebrovascular accident (CVA) due to occlusion of cerebral artery ([place])    Stroke with no evidence of hemorrhage. Patient is outside the treatment window for thrombolytics and has no large vessel occlusion that would be amenable to interventional radiology intervention. She will be admitted for management of stroke. Case is discussed with Dr. Tamala Julian of [facility] who agrees to admit the patient.    Delora Fuel, MD [phone]

## 2015-10-03 NOTE — Progress Notes (Signed)
Patient had just finished working with speech therapy; she was sitting upright in bed at 90 degrees; her face had increased droop on the left side; her left arm became flaccid; her left leg became flaccid; neurology paged stat; patient's head of bed was reduced; fluid bolus was given; vital signs reflected a significant drop in blood pressure from tx for SBP great then 180 at 1200 pm.  Frequent assessment preformed and NIH assessment; patient has improved but continues with left arm drift, ataxia but can now resist gravity and make a fist;left leg responded quicker to movement. Orders received from Neurology and carried out; family present at bedside and supportive.

## 2015-10-03 NOTE — H&P (Signed)
History and Physical    KACYN FUJII [id number] DOB: 1923/01/04 DOA: 10/03/2015  PCP: Nyoka Cowden, MD Patient coming from: home  Chief Complaint: left sided weakness/dysarthria  HPI: Karen [name] is a delightful 82 y.o. female with medical history significant tension, regular blind, hard of hearing, presents to the emergency department with the chief complaint of left-sided weakness. Initial evaluation concerning for stroke.  Information is obtained from the patient and the family who is at the bedside. She was in her usual state of health until about 4 AM when she awakened and needed to go to the restroom. She noticed that her left arm and her left leg "felt funny". She said that her left arm was "heavy" and she used her right arm to move it. She attempted to walk to the bathroom she noted inability to stand. She denies any fall. She also reports "choking" on some food last week. She denies headache chest pain palpitation shortness of breath diaphoresis. She denies abdominal pain nausea vomiting diarrhea constipation melena or bright red blood per rectum. She denies dysuria hematuria frequency or urgency. She denies any difficulty speaking or chewing. She denies any fever chills recent travel or sick contacts. She reports last night she was "playing cards with my friends". He reports she is legally blind and very hard of hearing    ED Course: The emergency department she is afebrile hypertensive not hypoxic and nontoxic appearing  Review of Systems: As per HPI otherwise 10 point review of systems negative.   Ambulatory Status: She uses a cane for ambulation and reports no recent falls  Past Medical History  Diagnosis Date  . ANEMIA 10/21/2008  . AORTIC INSUFFICIENCY 11/26/2009  . BACK PAIN 06/27/2007  . COLON CANCER, PERSONAL HX 02/25/2010  . Diverticulosis of colon (without mention of hemorrhage) 08/14/2006  . GALLSTONES 10/09/2008  . GLAUCOMA NOS 08/14/2006  . HYPERTENSION  08/14/2006  . HYPOKALEMIA, HX OF 11/26/2009  . MELENA, HX OF 10/12/2008  . NEOPLASM, MALIGNANT, COLON, CECUM 10/30/2008  . OCCLUSION, CENTRAL RETINAL ARTERY 08/14/2006  . PERSONAL HX COLONIC POLYPS 10/21/2008  . SYNCOPE, HX OF 08/14/2006  . URI 09/07/2008  . H/O: stroke     behind right eye    Past Surgical History  Procedure Laterality Date  . Appendectomy    . Cataract extraction      bilateral  . Dilation and curettage of uterus    . Tonsillectomy    . Colon surgery      11/20/2008,gallbladder removed  . Ankle fracture surgery  2011    right    Social History   Social History  . Marital Status: Married    Spouse Name: N/A  . Number of Children: 3  . Years of Education: N/A   Occupational History  . retired    Social History Main Topics  . Smoking status: Never Smoker   . Smokeless tobacco: Never Used  . Alcohol Use: No  . Drug Use: No  . Sexual Activity: Not on file   Other Topics Concern  . Not on file   Social History Narrative    Allergies  Allergen Reactions  . Morphine And Related Shortness Of Breath  . Sulfa Antibiotics Itching and Swelling  . Amoxicillin Itching and Rash  . Ciprofloxacin Itching and Rash  . Methocarbamol Itching  . Trimpex [Trimethoprim] Itching and Rash    Family History  Problem Relation Age of Onset  . Stroke Neg Hx   . Heart disease Neg  Hx   . Breast cancer Sister   . Heart attack Father   . Lung cancer Brother     Prior to Admission medications   Medication Sig Start Date End Date Taking? Authorizing Provider  aspirin 81 MG tablet Take 1 tablet (81 mg total) by mouth daily. 12/05/10   Marletta Lor, MD  atenolol (TENORMIN) 50 MG tablet Take 1 tablet (50 mg total) by mouth daily. 06/29/15   Marletta Lor, MD  bimatoprost (LUMIGAN) 0.01 % SOLN Place 1 drop into the right eye at bedtime.    Historical Provider, MD  brimonidine (ALPHAGAN P) 0.1 % SOLN Place 1 drop into both eyes every 8 (eight) hours.     Historical  Provider, MD  Calcium Carb-Cholecalciferol (CALCIUM 600/VITAMIN D3) 600-800 MG-UNIT TABS Take 1 tablet by mouth daily.    Historical Provider, MD  dorzolamide-timolol (COSOPT) 22.3-6.8 MG/ML ophthalmic solution Place 1 drop into both eyes 3 (three) times daily.     Historical Provider, MD  escitalopram (LEXAPRO) 10 MG tablet TAKE 1 TABLET (10 MG TOTAL) BY MOUTH DAILY. 08/09/15   Marletta Lor, MD  ibuprofen (ADVIL,MOTRIN) 200 MG tablet Take 600 mg by mouth every 6 (six) hours as needed (Pain).    Historical Provider, MD  vitamin C (ASCORBIC ACID) 500 MG tablet Take 500 mg by mouth daily.    Historical Provider, MD    Physical Exam: Filed Vitals:   10/03/15 0541 10/03/15 [id number] 10/03/15 0721 10/03/15 0752  BP: 198/71 219/67 211/68 227/65  Pulse: 68 61 65 57  Temp: 98.1 F (36.7 C)   97.6 F (36.4 C)  TempSrc: Oral     Resp: 16 16 15    Weight: 59.3 kg (130 lb 11.7 oz)     SpO2: 95% 97% 98% 100%     General:  Appears calm and comfortable, smiling and in no acute distress Eyes:  PERRL, EOMI, normal lids, both eyes are bloodshot ENT:  grossly normal hearing, lips & tongue, his membranes of her mouth are pink but dry Neck:  no LAD, masses or thyromegaly Cardiovascular:  RRR, no m/r/g. No LE edema.  Respiratory:  CTA bilaterally, no w/r/r. Normal respiratory effort. Abdomen:  soft, ntnd, positive bowel sounds throughout no guarding or rebounding Skin:  no rash or induration seen on limited exam Musculoskeletal:  grossly normal tone BUE/BLE, good ROM, no bony abnormality Psychiatric:  grossly normal mood and affect, speech fluent and appropriate, AOx3 Neurologic:  CN 2-12 grossly intact, moves all extremities in coordinated fashion, sensation intact  Labs on Admission: I have personally reviewed following labs and imaging studies  CBC:  Recent Labs Lab 10/03/15 0501 10/03/15 0518  WBC 8.2  --   NEUTROABS 4.0  --   HGB 12.5 12.6  HCT 38.7 37.0  MCV 90.0  --   PLT 235  --     Basic Metabolic Panel:  Recent Labs Lab 10/03/15 0501 10/03/15 0518  NA 137 140  K 4.2 4.3  CL 107 106  CO2 24  --   GLUCOSE 94 93  BUN 15 16  CREATININE 0.70 0.70  CALCIUM 8.8*  --    GFR: Estimated Creatinine Clearance: 43.8 mL/min (by C-G formula based on Cr of 0.7). Liver Function Tests:  Recent Labs Lab 10/03/15 0501  AST 31  ALT 10*  ALKPHOS 56  BILITOT 0.7  PROT 7.0  ALBUMIN 3.3*   No results for input(s): LIPASE, AMYLASE in the last 168 hours. No results for input(s):  AMMONIA in the last 168 hours. Coagulation Profile:  Recent Labs Lab 10/03/15 0501  INR 1.02   Cardiac Enzymes: No results for input(s): CKTOTAL, CKMB, CKMBINDEX, TROPONINI in the last 168 hours. BNP (last 3 results) No results for input(s): PROBNP in the last 8760 hours. HbA1C: No results for input(s): HGBA1C in the last 72 hours. CBG:  Recent Labs Lab 10/03/15 0636  GLUCAP 85   Lipid Profile: No results for input(s): CHOL, HDL, LDLCALC, TRIG, CHOLHDL, LDLDIRECT in the last 72 hours. Thyroid Function Tests: No results for input(s): TSH, T4TOTAL, FREET4, T3FREE, THYROIDAB in the last 72 hours. Anemia Panel: No results for input(s): VITAMINB12, FOLATE, FERRITIN, TIBC, IRON, RETICCTPCT in the last 72 hours. Urine analysis:    Component Value Date/Time   COLORURINE YELLOW 04/17/2015 2310   APPEARANCEUR CLEAR 04/17/2015 2310   LABSPEC 1.017 04/17/2015 2310   PHURINE 6.5 04/17/2015 2310   GLUCOSEU NEGATIVE 04/17/2015 2310   HGBUR NEGATIVE 04/17/2015 2310   BILIRUBINUR NEGATIVE 04/17/2015 2310   KETONESUR NEGATIVE 04/17/2015 2310   PROTEINUR NEGATIVE 04/17/2015 2310   UROBILINOGEN 1.0 05/25/2014 2302   NITRITE NEGATIVE 04/17/2015 2310   LEUKOCYTESUR NEGATIVE 04/17/2015 2310    Creatinine Clearance: Estimated Creatinine Clearance: 43.8 mL/min (by C-G formula based on Cr of 0.7).  Sepsis Labs: @LABRCNTIP (procalcitonin:4,lacticidven:4) )No results found for this or any  previous visit (from the past 240 hour(s)).   Radiological Exams on Admission: Ct Angio Head W Or Wo Contrast  10/03/2015  CLINICAL DATA:  Assess stroke or blockage. LEFT-sided weakness and dysarthria beginning at 0430 hours. EXAM: CT PERFUSION CT ANGIOGRAPHY OF THE HEAD AND NECK TECHNIQUE: Multidetector CT imaging of the head and neck was performed using the standard protocol during bolus administration of intravenous contrast. Multiplanar CT image reconstructions and MIPs were obtained to evaluate the vascular anatomy. Carotid stenosis measurements (when applicable) are obtained utilizing NASCET criteria, using the distal internal carotid diameter as the denominator. CT perfusion with off line post processing. CONTRAST:  50 cc Isovue 370 COMPARISON:  CT HEAD October 03, 2015 at 0505 hours FINDINGS: CT PERFUSION No large vascular territory perfusion abnormality. CTA NECK AORTIC ARCH: Normal appearance of the thoracic arch, normal branch pattern. Moderate calcific atherosclerosis. Origin of the arch vessels incompletely imaged. RIGHT CAROTID SYSTEM: Common carotid artery is widely patent, coursing in a straight line fashion. Normal appearance of the carotid bifurcation without hemodynamically significant stenosis by NASCET criteria. Mild eccentric calcific atherosclerosis. Normal appearance of the included internal carotid artery. LEFT CAROTID SYSTEM: Common carotid artery is widely patent, coursing in a straight line fashion. Normal appearance of the carotid bifurcation without hemodynamically significant stenosis by NASCET criteria. Mild eccentric calcific atherosclerosis. Normal appearance of the included internal carotid artery. VERTEBRAL ARTERIES:Left vertebral artery is dominant. Normal appearance of the vertebral arteries, which appear widely patent. SKELETON: No acute osseous process though bone windows have not been submitted. OTHER NECK: Soft tissues of the neck are non-acute though, not tailored for  evaluation. 11 mm RIGHT apical lung calcification. Less than 1 cm in LEFT thyroid nodule is below size followup recommendation. Atrophic submandibular glands. 10 mm carinal lymph node. CTA HEAD ANTERIOR CIRCULATION: Normal appearance of the cervical internal carotid arteries, petrous, cavernous and supra clinoid internal carotid arteries. Widely patent anterior communicating artery. Normal appearance of the anterior and middle cerebral arteries. No large vessel occlusion, hemodynamically significant stenosis, dissection, contrast extravasation or aneurysm. Moderate luminal irregularity of the anterior and middle cerebral arteries. POSTERIOR CIRCULATION: Normal appearance of the  vertebral arteries, vertebrobasilar junction and basilar artery, as well as main branch vessels. Normal appearance of the posterior cerebral arteries. Fetal origin bilateral posterior cerebral arteries. No large vessel occlusion, hemodynamically significant stenosis, dissection, contrast extravasation or aneurysm. Moderate luminal irregularity of the posterior cerebral arteries. VENOUS SINUSES: Major dural venous sinuses are patent though not tailored for evaluation on this angiographic examination. ANATOMIC VARIANTS: None. DELAYED PHASE: Not performed. IMPRESSION: CT PERFUSION:  No large vascular territory perfusion abnormality. CTA NECK: Atherosclerosis without hemodynamically significant stenosis or acute vascular process. 10 mm carinal lymph node may be re- active, incompletely evaluated. CTA HEAD:  No emergent large vessel occlusion or severe stenosis. Moderate luminal irregularity of the cerebral arteries compatible with atherosclerosis. Acute findings discussed with and reconfirmed by Dr.MCNEILL [facility] on 10/03/2015 at 6:35 am. Electronically Signed   By: Elon Alas M.D.   On: 10/03/2015 06:45   Ct Angio Neck W Or Wo Contrast  10/03/2015  CLINICAL DATA:  Assess stroke or blockage. LEFT-sided weakness and dysarthria  beginning at 0430 hours. EXAM: CT PERFUSION CT ANGIOGRAPHY OF THE HEAD AND NECK TECHNIQUE: Multidetector CT imaging of the head and neck was performed using the standard protocol during bolus administration of intravenous contrast. Multiplanar CT image reconstructions and MIPs were obtained to evaluate the vascular anatomy. Carotid stenosis measurements (when applicable) are obtained utilizing NASCET criteria, using the distal internal carotid diameter as the denominator. CT perfusion with off line post processing. CONTRAST:  50 cc Isovue 370 COMPARISON:  CT HEAD October 03, 2015 at 0505 hours FINDINGS: CT PERFUSION No large vascular territory perfusion abnormality. CTA NECK AORTIC ARCH: Normal appearance of the thoracic arch, normal branch pattern. Moderate calcific atherosclerosis. Origin of the arch vessels incompletely imaged. RIGHT CAROTID SYSTEM: Common carotid artery is widely patent, coursing in a straight line fashion. Normal appearance of the carotid bifurcation without hemodynamically significant stenosis by NASCET criteria. Mild eccentric calcific atherosclerosis. Normal appearance of the included internal carotid artery. LEFT CAROTID SYSTEM: Common carotid artery is widely patent, coursing in a straight line fashion. Normal appearance of the carotid bifurcation without hemodynamically significant stenosis by NASCET criteria. Mild eccentric calcific atherosclerosis. Normal appearance of the included internal carotid artery. VERTEBRAL ARTERIES:Left vertebral artery is dominant. Normal appearance of the vertebral arteries, which appear widely patent. SKELETON: No acute osseous process though bone windows have not been submitted. OTHER NECK: Soft tissues of the neck are non-acute though, not tailored for evaluation. 11 mm RIGHT apical lung calcification. Less than 1 cm in LEFT thyroid nodule is below size followup recommendation. Atrophic submandibular glands. 10 mm carinal lymph node. CTA HEAD ANTERIOR  CIRCULATION: Normal appearance of the cervical internal carotid arteries, petrous, cavernous and supra clinoid internal carotid arteries. Widely patent anterior communicating artery. Normal appearance of the anterior and middle cerebral arteries. No large vessel occlusion, hemodynamically significant stenosis, dissection, contrast extravasation or aneurysm. Moderate luminal irregularity of the anterior and middle cerebral arteries. POSTERIOR CIRCULATION: Normal appearance of the vertebral arteries, vertebrobasilar junction and basilar artery, as well as main branch vessels. Normal appearance of the posterior cerebral arteries. Fetal origin bilateral posterior cerebral arteries. No large vessel occlusion, hemodynamically significant stenosis, dissection, contrast extravasation or aneurysm. Moderate luminal irregularity of the posterior cerebral arteries. VENOUS SINUSES: Major dural venous sinuses are patent though not tailored for evaluation on this angiographic examination. ANATOMIC VARIANTS: None. DELAYED PHASE: Not performed. IMPRESSION: CT PERFUSION:  No large vascular territory perfusion abnormality. CTA NECK: Atherosclerosis without hemodynamically significant stenosis or acute vascular process.  10 mm carinal lymph node may be re- active, incompletely evaluated. CTA HEAD:  No emergent large vessel occlusion or severe stenosis. Moderate luminal irregularity of the cerebral arteries compatible with atherosclerosis. Acute findings discussed with and reconfirmed by Dr.MCNEILL [facility] on 10/03/2015 at 6:35 am. Electronically Signed   By: Elon Alas M.D.   On: 10/03/2015 06:45   Ct Cerebral Perfusion W Contrast  10/03/2015  CLINICAL DATA:  Assess stroke or blockage. LEFT-sided weakness and dysarthria beginning at 0430 hours. EXAM: CT PERFUSION CT ANGIOGRAPHY OF THE HEAD AND NECK TECHNIQUE: Multidetector CT imaging of the head and neck was performed using the standard protocol during bolus administration of  intravenous contrast. Multiplanar CT image reconstructions and MIPs were obtained to evaluate the vascular anatomy. Carotid stenosis measurements (when applicable) are obtained utilizing NASCET criteria, using the distal internal carotid diameter as the denominator. CT perfusion with off line post processing. CONTRAST:  50 cc Isovue 370 COMPARISON:  CT HEAD October 03, 2015 at 0505 hours FINDINGS: CT PERFUSION No large vascular territory perfusion abnormality. CTA NECK AORTIC ARCH: Normal appearance of the thoracic arch, normal branch pattern. Moderate calcific atherosclerosis. Origin of the arch vessels incompletely imaged. RIGHT CAROTID SYSTEM: Common carotid artery is widely patent, coursing in a straight line fashion. Normal appearance of the carotid bifurcation without hemodynamically significant stenosis by NASCET criteria. Mild eccentric calcific atherosclerosis. Normal appearance of the included internal carotid artery. LEFT CAROTID SYSTEM: Common carotid artery is widely patent, coursing in a straight line fashion. Normal appearance of the carotid bifurcation without hemodynamically significant stenosis by NASCET criteria. Mild eccentric calcific atherosclerosis. Normal appearance of the included internal carotid artery. VERTEBRAL ARTERIES:Left vertebral artery is dominant. Normal appearance of the vertebral arteries, which appear widely patent. SKELETON: No acute osseous process though bone windows have not been submitted. OTHER NECK: Soft tissues of the neck are non-acute though, not tailored for evaluation. 11 mm RIGHT apical lung calcification. Less than 1 cm in LEFT thyroid nodule is below size followup recommendation. Atrophic submandibular glands. 10 mm carinal lymph node. CTA HEAD ANTERIOR CIRCULATION: Normal appearance of the cervical internal carotid arteries, petrous, cavernous and supra clinoid internal carotid arteries. Widely patent anterior communicating artery. Normal appearance of the anterior  and middle cerebral arteries. No large vessel occlusion, hemodynamically significant stenosis, dissection, contrast extravasation or aneurysm. Moderate luminal irregularity of the anterior and middle cerebral arteries. POSTERIOR CIRCULATION: Normal appearance of the vertebral arteries, vertebrobasilar junction and basilar artery, as well as main branch vessels. Normal appearance of the posterior cerebral arteries. Fetal origin bilateral posterior cerebral arteries. No large vessel occlusion, hemodynamically significant stenosis, dissection, contrast extravasation or aneurysm. Moderate luminal irregularity of the posterior cerebral arteries. VENOUS SINUSES: Major dural venous sinuses are patent though not tailored for evaluation on this angiographic examination. ANATOMIC VARIANTS: None. DELAYED PHASE: Not performed. IMPRESSION: CT PERFUSION:  No large vascular territory perfusion abnormality. CTA NECK: Atherosclerosis without hemodynamically significant stenosis or acute vascular process. 10 mm carinal lymph node may be re- active, incompletely evaluated. CTA HEAD:  No emergent large vessel occlusion or severe stenosis. Moderate luminal irregularity of the cerebral arteries compatible with atherosclerosis. Acute findings discussed with and reconfirmed by Dr.MCNEILL [facility] on 10/03/2015 at 6:35 am. Electronically Signed   By: Elon Alas M.D.   On: 10/03/2015 06:45   Ct Head Code Stroke W/o Cm  10/03/2015  CLINICAL DATA:  Acute onset of left-sided weakness. Code stroke. Initial encounter. EXAM: CT HEAD WITHOUT CONTRAST TECHNIQUE: Contiguous axial images were  obtained from the base of the skull through the vertex without intravenous contrast. COMPARISON:  CT of the head performed 11/07/2009 FINDINGS: There is no evidence of acute infarction, mass lesion, or intra- or extra-axial hemorrhage on CT. Prominence of the ventricles and sulci reflects mild cortical volume loss. Scattered periventricular and  subcortical white matter change likely reflects small vessel ischemic microangiopathy. The brainstem and fourth ventricle are within normal limits. The basal ganglia are unremarkable in appearance. The cerebral hemispheres demonstrate grossly normal gray-white differentiation. No mass effect or midline shift is seen. There is no evidence of fracture; visualized osseous structures are unremarkable in appearance. Postoperative change is noted at the left optic globe. The right orbit is unremarkable in appearance. The paranasal sinuses and mastoid air cells are well-aerated. No significant soft tissue abnormalities are seen. IMPRESSION: 1. No acute intracranial pathology seen on CT. 2. Mild cortical volume loss and scattered small vessel ischemic microangiopathy. These results were called by telephone at the time of interpretation on 10/03/2015 at 5:28 am to Dr. Roland Rack, who verbally acknowledged these results. Electronically Signed   By: Garald Balding M.D.   On: 10/03/2015 05:27    EKG: Independently reviewed. Sinus rhythm with no acute changes  Assessment/Plan Principal Problem:   Stroke [facility]) Active Problems:   NEOPLASM, MALIGNANT, COLON, CECUM   OCCLUSION, CENTRAL RETINAL ARTERY   Aortic valve disorder   Essential hypertension   #1 stroke. History of hypertension. CT perfusion negative for large visceral intervening tubal lesion per neuro evaluation. Neurology recommended admission for stroke workup She failed bedside swallow eval given her report of recent choking on food per ED staff -admit to telemetry -Obtain a hemoglobin A1c fasting lipid panel -MRI MRA of the brain without contrast -Echocardiogram and carotid Dopplers -Aspirin per rectum until passes swallow eval -PT and OT as well as speech consult -Frequent neuro checks  #2. Hypertension. Fair control in the emergency department. Home medications include atenolol. Blood pressure 227/65 on admission -Hold atenolol for  now -When necessary hydralazine -Monitor -follow stroke protocol parameters  #3. Fusion central retinal artery. Patient legally blind. Appears stable -Continue home eyedrops -Fall precautions  4. History of colorectal cancer diagnosed in 2010. Recent oncology visit per chart review indicates no evidence of recurrent disease at this time.  DVT prophylaxis: Lovenox Code Status: Limited no compressions no intubation  Family Communication: Daughters and son-in-law at bedside  Disposition Plan: Home  Consults called: Dr. Leonel Ramsay with neurology  Admission status: Inpatient    Radene Gunning MD [facility]  If 7PM-7AM, please contact night-coverage [email or web address] Password [email or web address]  10/03/2015, 8:04 AM

## 2015-10-03 NOTE — Evaluation (Signed)
Clinical/Bedside Swallow Evaluation Patient Details  Name: Karen [name] MRN: [id number] Date of Birth: [birth year]  Today's Date: 10/03/2015 Time: SLP Start Time (ACUTE ONLY): 1210 SLP Stop Time (ACUTE ONLY): 1222 SLP Time Calculation (min) (ACUTE ONLY): 12 min  Past Medical History:  Past Medical History  Diagnosis Date  . ANEMIA 10/21/2008  . AORTIC INSUFFICIENCY 11/26/2009  . BACK PAIN 06/27/2007  . COLON CANCER, PERSONAL HX 02/25/2010  . Diverticulosis of colon (without mention of hemorrhage) 08/14/2006  . GALLSTONES 10/09/2008  . GLAUCOMA NOS 08/14/2006  . HYPERTENSION 08/14/2006  . HYPOKALEMIA, HX OF 11/26/2009  . MELENA, HX OF 10/12/2008  . NEOPLASM, MALIGNANT, COLON, CECUM 10/30/2008  . OCCLUSION, CENTRAL RETINAL ARTERY 08/14/2006  . PERSONAL HX COLONIC POLYPS 10/21/2008  . SYNCOPE, HX OF 08/14/2006  . URI 09/07/2008  . H/O: stroke     behind right eye   Past Surgical History:  Past Surgical History  Procedure Laterality Date  . Appendectomy    . Cataract extraction      bilateral  . Dilation and curettage of uterus    . Tonsillectomy    . Colon surgery      11/20/2008,gallbladder removed  . Ankle fracture surgery  2011    right   HPI:  Karen [name] is a 82 y.o. female with a Past Medical History of HTN, glaucoma. She was normal last night, playing cards with family and then woke up in the middle of the night with the inability to move left arm and left leg weakness. Family brought her to the ER. She at baseline is blind and walks with a cane, lives with family.She also reports "choking" on some food last week, and so she immediately failed the RN stroke swallow.    Assessment / Plan / Recommendation Clinical Impression  Pt demonstrates overall adequate tolerance of thin liquids and solids. Pt reports difficulty swallowing earlier this week was when a peanut got stuck in her throat (esophagus) and pt believes she has a "hiatal hernia". Pt was noted to have mild left CN VII and  XII weakness and suspected slight delayed swallow response with consecutive swallows. She did not cough over 16 oz of intake, but occasionally had slightly wet vocal quality immediately follow POs, though this cleared independently. Recommend pt initiate a regular diet and thin liquids with basic aspiration precautions and a left lingual sweep. Pt and family verbalized understanding. Will f/u x1 to check for tolerance of diet.     Aspiration Risk  Mild aspiration risk    Diet Recommendation Regular;Thin liquid   Liquid Administration via: Cup;Straw Medication Administration: Whole meds with liquid Supervision: Patient able to self feed Compensations: Slow rate;Small sips/bites Postural Changes: Seated upright at 90 degrees    Other  Recommendations     Follow up Recommendations  Outpatient SLP (for speech)    Frequency and Duration min 1 x/week  1 week       Prognosis Prognosis for Safe Diet Advancement: Good      Swallow Study   General HPI: Karen [name] is a 82 y.o. female with a Past Medical History of HTN, glaucoma. She was normal last night, playing cards with family and then woke up in the middle of the night with the inability to move left arm and left leg weakness. Family brought her to the ER. She at baseline is blind and walks with a cane, lives with family.She also reports "choking" on some food last week, and so she immediately  failed the RN stroke swallow.  Type of Study: Bedside Swallow Evaluation Previous Swallow Assessment: none Diet Prior to this Study: NPO Temperature Spikes Noted: No Respiratory Status: Room air History of Recent Intubation: No Behavior/Cognition: Alert;Cooperative;Pleasant mood Oral Cavity Assessment: Within Functional Limits Oral Care Completed by SLP: No Oral Cavity - Dentition: Adequate natural dentition Vision: Impaired for self-feeding Self-Feeding Abilities: Needs assist Patient Positioning: Upright in bed Baseline Vocal  Quality: Normal Volitional Cough: Strong Volitional Swallow: Able to elicit    Oral/Motor/Sensory Function Overall Oral Motor/Sensory Function: Mild impairment Facial ROM: Reduced left;Suspected CN VII (facial) dysfunction Facial Symmetry: Abnormal symmetry left;Suspected CN VII (facial) dysfunction Facial Strength: Reduced left;Suspected CN VII (facial) dysfunction Facial Sensation: Within Functional Limits Lingual ROM: Reduced left;Suspected CN XII (hypoglossal) dysfunction Lingual Symmetry: Abnormal symmetry left;Suspected CN XII (hypoglossal) dysfunction Lingual Strength: Suspected CN XII (hypoglossal) dysfunction Lingual Sensation: Within Functional Limits Velum: Within Functional Limits Mandible: Within Functional Limits   Ice Chips Ice chips: Not tested   Thin Liquid Thin Liquid: Impaired Presentation: Cup;Straw Pharyngeal  Phase Impairments: Wet Vocal Quality;Suspected delayed Swallow    Nectar Thick Nectar Thick Liquid: Not tested   Honey Thick Honey Thick Liquid: Not tested   Puree Puree: Within functional limits   Solid   GO   Solid: Impaired Presentation: Self Fed Pharyngeal Phase Impairments: [employer] Vocal Quality       Herbie [place], MA CCC-SLP [phone]   Karen [name], Karen [name] 10/03/2015,1:26 PM

## 2015-10-04 DIAGNOSIS — I69391 Dysphagia following cerebral infarction: Secondary | ICD-10-CM

## 2015-10-04 DIAGNOSIS — R000 Tachycardia, unspecified: Secondary | ICD-10-CM | POA: Insufficient documentation

## 2015-10-04 DIAGNOSIS — I10 Essential (primary) hypertension: Secondary | ICD-10-CM

## 2015-10-04 DIAGNOSIS — I952 Hypotension due to drugs: Secondary | ICD-10-CM

## 2015-10-04 DIAGNOSIS — I639 Cerebral infarction, unspecified: Secondary | ICD-10-CM

## 2015-10-04 DIAGNOSIS — I63031 Cerebral infarction due to thrombosis of right carotid artery: Secondary | ICD-10-CM

## 2015-10-04 DIAGNOSIS — I6350 Cerebral infarction due to unspecified occlusion or stenosis of unspecified cerebral artery: Secondary | ICD-10-CM

## 2015-10-04 DIAGNOSIS — E785 Hyperlipidemia, unspecified: Secondary | ICD-10-CM

## 2015-10-04 DIAGNOSIS — H548 Legal blindness, as defined in USA: Secondary | ICD-10-CM

## 2015-10-04 LAB — LIPID PANEL
Cholesterol: 120 mg/dL (ref 0–200)
HDL: 33 mg/dL — ABNORMAL LOW (ref >40)
LDL CALC: 71 mg/dL (ref 0–99)
Total CHOL/HDL Ratio: 3.6 RATIO
Triglycerides: 79 mg/dL (ref <150)
VLDL: 16 mg/dL (ref 0–40)

## 2015-10-04 LAB — HEMOGLOBIN A1C
Hgb A1c MFr Bld: 5.4 % (ref 4.8–5.6)
MEAN PLASMA GLUCOSE: 108 mg/dL

## 2015-10-04 MED ORDER — IOPAMIDOL (ISOVUE-370) INJECTION 76%
100.0000 mL | Freq: Once | INTRAVENOUS | Status: AC | PRN
  Administered 2015-10-03: 100 mL via INTRAVENOUS

## 2015-10-04 MED ORDER — ACETAMINOPHEN 325 MG PO TABS
650.0000 mg | ORAL_TABLET | Freq: Four times a day (QID) | ORAL | Status: DC | PRN
  Administered 2015-10-04: 650 mg via ORAL
  Filled 2015-10-04: qty 2

## 2015-10-04 MED ORDER — PRAVASTATIN SODIUM 20 MG PO TABS
10.0000 mg | ORAL_TABLET | Freq: Every day | ORAL | Status: DC
  Administered 2015-10-04: 10 mg via ORAL
  Filled 2015-10-04: qty 1

## 2015-10-04 MED ORDER — PRAVASTATIN SODIUM 10 MG PO TABS: 10.0000 mg | ORAL_TABLET | Freq: Every day | ORAL | Status: DC

## 2015-10-04 MED ORDER — ATENOLOL 50 MG PO TABS: 50.0000 mg | ORAL_TABLET | Freq: Every day | ORAL | Status: DC

## 2015-10-04 MED ORDER — DIPHENHYDRAMINE HCL 12.5 MG/5ML PO ELIX
12.5000 mg | ORAL_SOLUTION | Freq: Every evening | ORAL | Status: DC | PRN
  Administered 2015-10-04: 12.5 mg via ORAL
  Filled 2015-10-04: qty 10

## 2015-10-04 MED ORDER — CLOPIDOGREL BISULFATE 75 MG PO TABS: 75.0000 mg | ORAL_TABLET | Freq: Every day | ORAL | Status: DC

## 2015-10-04 NOTE — Discharge Summary (Signed)
Physician Discharge Summary  Karen [name] [id number] DOB: 03/16/1923 DOA: 10/03/2015  PCP: Nyoka Cowden, MD  Admit date: 10/03/2015 Discharge date: 10/05/15  Admitted From: Home Disposition:  CIR  Recommendations for Outpatient Follow-up:  1. Follow up with PCP in 1-2 weeks 2. Please obtain BMP/CBC in one week    Discharge Condition: stable CODE STATUS: partial DNR--no intubation Diet recommendation: Heart Healthy   Brief/Interim Summary: 82 y/o female with history of HTN and colon cancer presented with acute onset left-sided weakness when she woke up to go to the bathroom at 4 AM on 10/03/2015. The patient initially felt that her left arm felt "heavy". She had difficulty standing, and her left leg "felt funny". EMS was activated, and initial CT of the brain was negative. Neurology was consulted, and full stroke workup was undertaken. MRI of the brain showed no acute lacunar infarct in the right corona radiata. Previous medical records reviewed and summarized  Discharge Diagnoses:  Acute Right corona radiata/basal ganglia infarct -Neurology Consult appreciated -PT/OT evaluation-->CIR -Speech therapy eval--dysphagia 2 with thin liquids -CT brain--neg -MRI brain--right corona radiata infarct -CT angiogram head and neck--b/l fetal PCAs, diminutive BA and b/l BVJs, and b/l VA origin atherosclerosis -Echo--EF 60-65%, grade 1 daily, no source of emboli -LDL--71 -HbA1C--5.4 -Discontinue aspirin, start Plavix 10/04/15-case discussed with Dr. Leonie Man -personally reviewed EKG--sinus, no ST-T changes  Hypertension -allow for permissive hypertension -Patient initially received hydralazine 1 for systolic blood pressure [id number] which brought her systolic blood pressure down to 123. -restart atenolol on 10/06/15 -Albumin 12.5 g every 6 hours 4 given with improving SBP  Dyslipidemia -LDL 71  -start pravastatin  Dysphagia -dysphagia 2 diet with thin liquids per  speech  Colorectal cancer -Stage II colorectal cancer diagnosed in 2010. She had a T3 N0 disease, status post surgical resection. -presently in remission -follow Dr. Alen Blew   Discharge Instructions      Discharge Instructions    Ambulatory referral to Neurology    Complete by:  As directed   This is a routine stroke follow up. An appointment is requested in 1 month with NP            Medication List    STOP taking these medications        aspirin 81 MG tablet      TAKE these medications        atenolol 50 MG tablet  Commonly known as:  TENORMIN  Take 1 tablet (50 mg total) by mouth daily.  Start taking on:  10/06/2015     bimatoprost 0.01 % Soln  Commonly known as:  LUMIGAN  Place 1 drop into the right eye at bedtime.     brimonidine 0.1 % Soln  Commonly known as:  ALPHAGAN P  Place 1 drop into both eyes every 8 (eight) hours.     CALCIUM 600/VITAMIN D3 600-800 MG-UNIT Tabs  Generic drug:  Calcium Carb-Cholecalciferol  Take 1 tablet by mouth daily.     clopidogrel 75 MG tablet  Commonly known as:  PLAVIX  Take 1 tablet (75 mg total) by mouth daily.     diphenhydramine-acetaminophen 25-500 MG Tabs tablet  Commonly known as:  TYLENOL PM  Take 1 tablet by mouth at bedtime.     escitalopram 10 MG tablet  Commonly known as:  LEXAPRO  TAKE 1 TABLET (10 MG TOTAL) BY MOUTH DAILY.     pravastatin 10 MG tablet  Commonly known as:  PRAVACHOL  Take 1 tablet (10 mg total) by  mouth daily at 6 PM.     vitamin C 500 MG tablet  Commonly known as:  ASCORBIC ACID  Take 500 mg by mouth daily.       Follow-up Information    Follow up with [facility] In 2 months.   Specialty:  Neurology   Why:  Stroke Clinic, Office will call you with appointment date & time, likely for appt with Nurse Practitioner for Dr. Shelle Iron information:   [place] [phone]     Allergies  Allergen Reactions   . Morphine And Related Shortness Of Breath  . Sulfa Antibiotics Itching and Swelling  . Tape Other (See Comments)    PLEASE USE COBAN WRAP/PATIENT BRUISES EASILY AND HER SKIN IS THIN  . Amoxicillin Itching and Rash  . Ciprofloxacin Itching and Rash  . Methocarbamol Itching  . Trimpex [Trimethoprim] Itching and Rash    Consultations:  Neurology  PM&R   Procedures/Studies: Ct Angio Head W Or Wo Contrast  10/03/2015  CLINICAL DATA:  Assess stroke or blockage. LEFT-sided weakness and dysarthria beginning at 0430 hours. EXAM: CT PERFUSION CT ANGIOGRAPHY OF THE HEAD AND NECK TECHNIQUE: Multidetector CT imaging of the head and neck was performed using the standard protocol during bolus administration of intravenous contrast. Multiplanar CT image reconstructions and MIPs were obtained to evaluate the vascular anatomy. Carotid stenosis measurements (when applicable) are obtained utilizing NASCET criteria, using the distal internal carotid diameter as the denominator. CT perfusion with off line post processing. CONTRAST:  50 cc Isovue 370 COMPARISON:  CT HEAD October 03, 2015 at 0505 hours FINDINGS: CT PERFUSION No large vascular territory perfusion abnormality. CTA NECK AORTIC ARCH: Normal appearance of the thoracic arch, normal branch pattern. Moderate calcific atherosclerosis. Origin of the arch vessels incompletely imaged. RIGHT CAROTID SYSTEM: Common carotid artery is widely patent, coursing in a straight line fashion. Normal appearance of the carotid bifurcation without hemodynamically significant stenosis by NASCET criteria. Mild eccentric calcific atherosclerosis. Normal appearance of the included internal carotid artery. LEFT CAROTID SYSTEM: Common carotid artery is widely patent, coursing in a straight line fashion. Normal appearance of the carotid bifurcation without hemodynamically significant stenosis by NASCET criteria. Mild eccentric calcific atherosclerosis. Normal appearance of the included  internal carotid artery. VERTEBRAL ARTERIES:Left vertebral artery is dominant. Normal appearance of the vertebral arteries, which appear widely patent. SKELETON: No acute osseous process though bone windows have not been submitted. OTHER NECK: Soft tissues of the neck are non-acute though, not tailored for evaluation. 11 mm RIGHT apical lung calcification. Less than 1 cm in LEFT thyroid nodule is below size followup recommendation. Atrophic submandibular glands. 10 mm carinal lymph node. CTA HEAD ANTERIOR CIRCULATION: Normal appearance of the cervical internal carotid arteries, petrous, cavernous and supra clinoid internal carotid arteries. Widely patent anterior communicating artery. Normal appearance of the anterior and middle cerebral arteries. No large vessel occlusion, hemodynamically significant stenosis, dissection, contrast extravasation or aneurysm. Moderate luminal irregularity of the anterior and middle cerebral arteries. POSTERIOR CIRCULATION: Normal appearance of the vertebral arteries, vertebrobasilar junction and basilar artery, as well as main branch vessels. Normal appearance of the posterior cerebral arteries. Fetal origin bilateral posterior cerebral arteries. No large vessel occlusion, hemodynamically significant stenosis, dissection, contrast extravasation or aneurysm. Moderate luminal irregularity of the posterior cerebral arteries. VENOUS SINUSES: Major dural venous sinuses are patent though not tailored for evaluation on this angiographic examination. ANATOMIC VARIANTS: None. DELAYED PHASE: Not performed. IMPRESSION: CT PERFUSION:  No large vascular territory perfusion abnormality. CTA NECK: Atherosclerosis without hemodynamically significant stenosis or acute vascular process. 10 mm carinal lymph node may be re- active, incompletely evaluated. CTA HEAD:  No emergent large vessel occlusion or severe stenosis. Moderate luminal irregularity of the cerebral arteries compatible with  atherosclerosis. Acute findings discussed with and reconfirmed by Dr.MCNEILL [facility] on 10/03/2015 at 6:35 am. Electronically Signed   By: Elon Alas M.D.   On: 10/03/2015 06:45   Dg Chest 2 View  10/03/2015  CLINICAL DATA:  Left-sided weakness.  Hypertension. EXAM: CHEST  2 VIEW COMPARISON:  Chest radiograph and chest CT April 02, 2015 FINDINGS: There is a calcified granuloma in the right upper lobe. There is no edema or consolidation. Heart is mildly enlarged with pulmonary vascularity within normal limits. There is atherosclerotic calcification in the aorta. No adenopathy. There is degenerative change in each shoulder. There is also degenerative change in the thoracic spine. IMPRESSION: Mild cardiomegaly. No edema or consolidation. Calcified granuloma right upper lobe. Aortic atherosclerosis. Electronically Signed   By: Lowella Grip III M.D.   On: 10/03/2015 10:13   Ct Angio Neck W Or Wo Contrast  10/03/2015  CLINICAL DATA:  Assess stroke or blockage. LEFT-sided weakness and dysarthria beginning at 0430 hours. EXAM: CT PERFUSION CT ANGIOGRAPHY OF THE HEAD AND NECK TECHNIQUE: Multidetector CT imaging of the head and neck was performed using the standard protocol during bolus administration of intravenous contrast. Multiplanar CT image reconstructions and MIPs were obtained to evaluate the vascular anatomy. Carotid stenosis measurements (when applicable) are obtained utilizing NASCET criteria, using the distal internal carotid diameter as the denominator. CT perfusion with off line post processing. CONTRAST:  50 cc Isovue 370 COMPARISON:  CT HEAD October 03, 2015 at 0505 hours FINDINGS: CT PERFUSION No large vascular territory perfusion abnormality. CTA NECK AORTIC ARCH: Normal appearance of the thoracic arch, normal branch pattern. Moderate calcific atherosclerosis. Origin of the arch vessels incompletely imaged. RIGHT CAROTID SYSTEM: Common carotid artery is widely patent, coursing in a straight  line fashion. Normal appearance of the carotid bifurcation without hemodynamically significant stenosis by NASCET criteria. Mild eccentric calcific atherosclerosis. Normal appearance of the included internal carotid artery. LEFT CAROTID SYSTEM: Common carotid artery is widely patent, coursing in a straight line fashion. Normal appearance of the carotid bifurcation without hemodynamically significant stenosis by NASCET criteria. Mild eccentric calcific atherosclerosis. Normal appearance of the included internal carotid artery. VERTEBRAL ARTERIES:Left vertebral artery is dominant. Normal appearance of the vertebral arteries, which appear widely patent. SKELETON: No acute osseous process though bone windows have not been submitted. OTHER NECK: Soft tissues of the neck are non-acute though, not tailored for evaluation. 11 mm RIGHT apical lung calcification. Less than 1 cm in LEFT thyroid nodule is below size followup recommendation. Atrophic submandibular glands. 10 mm carinal lymph node. CTA HEAD ANTERIOR CIRCULATION: Normal appearance of the cervical internal carotid arteries, petrous, cavernous and supra clinoid internal carotid arteries. Widely patent anterior communicating artery. Normal appearance of the anterior and middle cerebral arteries. No large vessel occlusion, hemodynamically significant stenosis, dissection, contrast extravasation or aneurysm. Moderate luminal irregularity of the anterior and middle cerebral arteries. POSTERIOR CIRCULATION: Normal appearance of the vertebral arteries, vertebrobasilar junction and basilar artery, as well as main branch vessels. Normal appearance of the posterior cerebral arteries. Fetal origin bilateral posterior cerebral arteries. No large vessel occlusion, hemodynamically significant stenosis, dissection, contrast extravasation or aneurysm. Moderate luminal irregularity of the posterior cerebral arteries. VENOUS SINUSES: Major dural venous sinuses are  patent though not  tailored for evaluation on this angiographic examination. ANATOMIC VARIANTS: None. DELAYED PHASE: Not performed. IMPRESSION: CT PERFUSION:  No large vascular territory perfusion abnormality. CTA NECK: Atherosclerosis without hemodynamically significant stenosis or acute vascular process. 10 mm carinal lymph node may be re- active, incompletely evaluated. CTA HEAD:  No emergent large vessel occlusion or severe stenosis. Moderate luminal irregularity of the cerebral arteries compatible with atherosclerosis. Acute findings discussed with and reconfirmed by Dr.MCNEILL [facility] on 10/03/2015 at 6:35 am. Electronically Signed   By: Elon Alas M.D.   On: 10/03/2015 06:45   Mr Brain Wo Contrast  10/03/2015  CLINICAL DATA:  82 year old female with acute left leg weakness in the middle of the night. Code stroke. Initial encounter. EXAM: MRI HEAD WITHOUT CONTRAST TECHNIQUE: Multiplanar, multiecho pulse sequences of the brain and surrounding structures were obtained without intravenous contrast. COMPARISON:  Head CT, CTA, and CT perfusion without contrast 0505 hours today, and earlier. FINDINGS: 15 mm area of confluent restricted diffusion tracking from the posterior aspect of the right corona radiata toward the posterior right lentiform nuclei (series 10, image 16). Mild associated T2 and FLAIR hyperintensity. No associated hemorrhage or mass effect. No other restricted diffusion. Major intracranial vascular flow voids are preserved. Widespread increased perivascular spaces in the deep gray matter nuclei. Moderate T2 hyperintensity in the thalami compatible with chronic small vessel ischemia worse on the left. Patchy and confluent bilateral cerebral white matter T2 and FLAIR hyperintensity. No cortical encephalomalacia identified. There are occasional chronic micro hemorrhages in the brain, including at the posterior limb of the left external capsule, in the left paracentral pons, and in the left deep cerebellar  nuclei. Minimal T2 heterogeneity in the pons. No midline shift, mass effect, evidence of mass lesion, ventriculomegaly, extra-axial collection or acute intracranial hemorrhage. Cervicomedullary junction and pituitary are within normal limits. Negative visualized cervical spine. Visualized bone marrow signal is within normal limits. Visible internal auditory structures appear normal. Trace mastoid fluid. Mild paranasal sinus mucosal thickening. Postoperative changes to both globes. Otherwise negative orbit and scalp soft tissues. IMPRESSION: 1. Acute lacunar type infarct tracking from the posterior right corona radiata to the posterior lentiform nuclei. No associated hemorrhage or mass effect. 2. Underlying chronic small vessel disease, moderate for age. Electronically Signed   By: Genevie Ann M.D.   On: 10/03/2015 12:14   Ct Cerebral Perfusion W Contrast  10/03/2015  CLINICAL DATA:  Assess stroke or blockage. LEFT-sided weakness and dysarthria beginning at 0430 hours. EXAM: CT PERFUSION CT ANGIOGRAPHY OF THE HEAD AND NECK TECHNIQUE: Multidetector CT imaging of the head and neck was performed using the standard protocol during bolus administration of intravenous contrast. Multiplanar CT image reconstructions and MIPs were obtained to evaluate the vascular anatomy. Carotid stenosis measurements (when applicable) are obtained utilizing NASCET criteria, using the distal internal carotid diameter as the denominator. CT perfusion with off line post processing. CONTRAST:  50 cc Isovue 370 COMPARISON:  CT HEAD October 03, 2015 at 0505 hours FINDINGS: CT PERFUSION No large vascular territory perfusion abnormality. CTA NECK AORTIC ARCH: Normal appearance of the thoracic arch, normal branch pattern. Moderate calcific atherosclerosis. Origin of the arch vessels incompletely imaged. RIGHT CAROTID SYSTEM: Common carotid artery is widely patent, coursing in a straight line fashion. Normal appearance of the carotid bifurcation without  hemodynamically significant stenosis by NASCET criteria. Mild eccentric calcific atherosclerosis. Normal appearance of the included internal carotid artery. LEFT CAROTID SYSTEM: Common carotid artery is widely patent, coursing in a straight  line fashion. Normal appearance of the carotid bifurcation without hemodynamically significant stenosis by NASCET criteria. Mild eccentric calcific atherosclerosis. Normal appearance of the included internal carotid artery. VERTEBRAL ARTERIES:Left vertebral artery is dominant. Normal appearance of the vertebral arteries, which appear widely patent. SKELETON: No acute osseous process though bone windows have not been submitted. OTHER NECK: Soft tissues of the neck are non-acute though, not tailored for evaluation. 11 mm RIGHT apical lung calcification. Less than 1 cm in LEFT thyroid nodule is below size followup recommendation. Atrophic submandibular glands. 10 mm carinal lymph node. CTA HEAD ANTERIOR CIRCULATION: Normal appearance of the cervical internal carotid arteries, petrous, cavernous and supra clinoid internal carotid arteries. Widely patent anterior communicating artery. Normal appearance of the anterior and middle cerebral arteries. No large vessel occlusion, hemodynamically significant stenosis, dissection, contrast extravasation or aneurysm. Moderate luminal irregularity of the anterior and middle cerebral arteries. POSTERIOR CIRCULATION: Normal appearance of the vertebral arteries, vertebrobasilar junction and basilar artery, as well as main branch vessels. Normal appearance of the posterior cerebral arteries. Fetal origin bilateral posterior cerebral arteries. No large vessel occlusion, hemodynamically significant stenosis, dissection, contrast extravasation or aneurysm. Moderate luminal irregularity of the posterior cerebral arteries. VENOUS SINUSES: Major dural venous sinuses are patent though not tailored for evaluation on this angiographic examination. ANATOMIC  VARIANTS: None. DELAYED PHASE: Not performed. IMPRESSION: CT PERFUSION:  No large vascular territory perfusion abnormality. CTA NECK: Atherosclerosis without hemodynamically significant stenosis or acute vascular process. 10 mm carinal lymph node may be re- active, incompletely evaluated. CTA HEAD:  No emergent large vessel occlusion or severe stenosis. Moderate luminal irregularity of the cerebral arteries compatible with atherosclerosis. Acute findings discussed with and reconfirmed by Dr.MCNEILL [facility] on 10/03/2015 at 6:35 am. Electronically Signed   By: Elon Alas M.D.   On: 10/03/2015 06:45   Ct Head Code Stroke W/o Cm  10/03/2015  CLINICAL DATA:  Acute onset of left-sided weakness. Code stroke. Initial encounter. EXAM: CT HEAD WITHOUT CONTRAST TECHNIQUE: Contiguous axial images were obtained from the base of the skull through the vertex without intravenous contrast. COMPARISON:  CT of the head performed 11/07/2009 FINDINGS: There is no evidence of acute infarction, mass lesion, or intra- or extra-axial hemorrhage on CT. Prominence of the ventricles and sulci reflects mild cortical volume loss. Scattered periventricular and subcortical white matter change likely reflects small vessel ischemic microangiopathy. The brainstem and fourth ventricle are within normal limits. The basal ganglia are unremarkable in appearance. The cerebral hemispheres demonstrate grossly normal gray-white differentiation. No mass effect or midline shift is seen. There is no evidence of fracture; visualized osseous structures are unremarkable in appearance. Postoperative change is noted at the left optic globe. The right orbit is unremarkable in appearance. The paranasal sinuses and mastoid air cells are well-aerated. No significant soft tissue abnormalities are seen. IMPRESSION: 1. No acute intracranial pathology seen on CT. 2. Mild cortical volume loss and scattered small vessel ischemic microangiopathy. These results were  called by telephone at the time of interpretation on 10/03/2015 at 5:28 am to Dr. Roland Rack, who verbally acknowledged these results. Electronically Signed   By: Garald Balding M.D.   On: 10/03/2015 05:27        Discharge Exam: Filed Vitals:   10/04/15 1331 10/04/15 1751  BP: 173/63 164/59  Pulse: 105 72  Temp: 97.7 F (36.5 C) 98.5 F (36.9 C)  Resp: 18 18   Filed Vitals:   10/04/15 0631 10/04/15 0930 10/04/15 1331 10/04/15 1751  BP: 161/75 157/69 173/63  164/59  Pulse: 72 72 105 72  Temp: 98 F (36.7 C) 98 F (36.7 C) 97.7 F (36.5 C) 98.5 F (36.9 C)  TempSrc: Oral Oral Oral Oral  Resp: 17 18 18 18   Height:      Weight:      SpO2: 99% 97% 96% 96%    General: Pt is alert, awake, not in acute distress Cardiovascular: RRR, S1/S2 +, no rubs, no gallops Respiratory: CTA bilaterally, no wheezing, no rhonchi Abdominal: Soft, NT, ND, bowel sounds + Extremities: no edema, no cyanosis   The results of significant diagnostics from this hospitalization (including imaging, microbiology, ancillary and laboratory) are listed below for reference.    Significant Diagnostic Studies: Ct Angio Head W Or Wo Contrast  10/03/2015  CLINICAL DATA:  Assess stroke or blockage. LEFT-sided weakness and dysarthria beginning at 0430 hours. EXAM: CT PERFUSION CT ANGIOGRAPHY OF THE HEAD AND NECK TECHNIQUE: Multidetector CT imaging of the head and neck was performed using the standard protocol during bolus administration of intravenous contrast. Multiplanar CT image reconstructions and MIPs were obtained to evaluate the vascular anatomy. Carotid stenosis measurements (when applicable) are obtained utilizing NASCET criteria, using the distal internal carotid diameter as the denominator. CT perfusion with off line post processing. CONTRAST:  50 cc Isovue 370 COMPARISON:  CT HEAD October 03, 2015 at 0505 hours FINDINGS: CT PERFUSION No large vascular territory perfusion abnormality. CTA NECK AORTIC ARCH:  Normal appearance of the thoracic arch, normal branch pattern. Moderate calcific atherosclerosis. Origin of the arch vessels incompletely imaged. RIGHT CAROTID SYSTEM: Common carotid artery is widely patent, coursing in a straight line fashion. Normal appearance of the carotid bifurcation without hemodynamically significant stenosis by NASCET criteria. Mild eccentric calcific atherosclerosis. Normal appearance of the included internal carotid artery. LEFT CAROTID SYSTEM: Common carotid artery is widely patent, coursing in a straight line fashion. Normal appearance of the carotid bifurcation without hemodynamically significant stenosis by NASCET criteria. Mild eccentric calcific atherosclerosis. Normal appearance of the included internal carotid artery. VERTEBRAL ARTERIES:Left vertebral artery is dominant. Normal appearance of the vertebral arteries, which appear widely patent. SKELETON: No acute osseous process though bone windows have not been submitted. OTHER NECK: Soft tissues of the neck are non-acute though, not tailored for evaluation. 11 mm RIGHT apical lung calcification. Less than 1 cm in LEFT thyroid nodule is below size followup recommendation. Atrophic submandibular glands. 10 mm carinal lymph node. CTA HEAD ANTERIOR CIRCULATION: Normal appearance of the cervical internal carotid arteries, petrous, cavernous and supra clinoid internal carotid arteries. Widely patent anterior communicating artery. Normal appearance of the anterior and middle cerebral arteries. No large vessel occlusion, hemodynamically significant stenosis, dissection, contrast extravasation or aneurysm. Moderate luminal irregularity of the anterior and middle cerebral arteries. POSTERIOR CIRCULATION: Normal appearance of the vertebral arteries, vertebrobasilar junction and basilar artery, as well as main branch vessels. Normal appearance of the posterior cerebral arteries. Fetal origin bilateral posterior cerebral arteries. No large  vessel occlusion, hemodynamically significant stenosis, dissection, contrast extravasation or aneurysm. Moderate luminal irregularity of the posterior cerebral arteries. VENOUS SINUSES: Major dural venous sinuses are patent though not tailored for evaluation on this angiographic examination. ANATOMIC VARIANTS: None. DELAYED PHASE: Not performed. IMPRESSION: CT PERFUSION:  No large vascular territory perfusion abnormality. CTA NECK: Atherosclerosis without hemodynamically significant stenosis or acute vascular process. 10 mm carinal lymph node may be re- active, incompletely evaluated. CTA HEAD:  No emergent large vessel occlusion or severe stenosis. Moderate luminal irregularity of the cerebral arteries compatible with atherosclerosis.  Acute findings discussed with and reconfirmed by Dr.MCNEILL [facility] on 10/03/2015 at 6:35 am. Electronically Signed   By: Elon Alas M.D.   On: 10/03/2015 06:45   Dg Chest 2 View  10/03/2015  CLINICAL DATA:  Left-sided weakness.  Hypertension. EXAM: CHEST  2 VIEW COMPARISON:  Chest radiograph and chest CT April 02, 2015 FINDINGS: There is a calcified granuloma in the right upper lobe. There is no edema or consolidation. Heart is mildly enlarged with pulmonary vascularity within normal limits. There is atherosclerotic calcification in the aorta. No adenopathy. There is degenerative change in each shoulder. There is also degenerative change in the thoracic spine. IMPRESSION: Mild cardiomegaly. No edema or consolidation. Calcified granuloma right upper lobe. Aortic atherosclerosis. Electronically Signed   By: Lowella Grip III M.D.   On: 10/03/2015 10:13   Ct Angio Neck W Or Wo Contrast  10/03/2015  CLINICAL DATA:  Assess stroke or blockage. LEFT-sided weakness and dysarthria beginning at 0430 hours. EXAM: CT PERFUSION CT ANGIOGRAPHY OF THE HEAD AND NECK TECHNIQUE: Multidetector CT imaging of the head and neck was performed using the standard protocol during bolus  administration of intravenous contrast. Multiplanar CT image reconstructions and MIPs were obtained to evaluate the vascular anatomy. Carotid stenosis measurements (when applicable) are obtained utilizing NASCET criteria, using the distal internal carotid diameter as the denominator. CT perfusion with off line post processing. CONTRAST:  50 cc Isovue 370 COMPARISON:  CT HEAD October 03, 2015 at 0505 hours FINDINGS: CT PERFUSION No large vascular territory perfusion abnormality. CTA NECK AORTIC ARCH: Normal appearance of the thoracic arch, normal branch pattern. Moderate calcific atherosclerosis. Origin of the arch vessels incompletely imaged. RIGHT CAROTID SYSTEM: Common carotid artery is widely patent, coursing in a straight line fashion. Normal appearance of the carotid bifurcation without hemodynamically significant stenosis by NASCET criteria. Mild eccentric calcific atherosclerosis. Normal appearance of the included internal carotid artery. LEFT CAROTID SYSTEM: Common carotid artery is widely patent, coursing in a straight line fashion. Normal appearance of the carotid bifurcation without hemodynamically significant stenosis by NASCET criteria. Mild eccentric calcific atherosclerosis. Normal appearance of the included internal carotid artery. VERTEBRAL ARTERIES:Left vertebral artery is dominant. Normal appearance of the vertebral arteries, which appear widely patent. SKELETON: No acute osseous process though bone windows have not been submitted. OTHER NECK: Soft tissues of the neck are non-acute though, not tailored for evaluation. 11 mm RIGHT apical lung calcification. Less than 1 cm in LEFT thyroid nodule is below size followup recommendation. Atrophic submandibular glands. 10 mm carinal lymph node. CTA HEAD ANTERIOR CIRCULATION: Normal appearance of the cervical internal carotid arteries, petrous, cavernous and supra clinoid internal carotid arteries. Widely patent anterior communicating artery. Normal  appearance of the anterior and middle cerebral arteries. No large vessel occlusion, hemodynamically significant stenosis, dissection, contrast extravasation or aneurysm. Moderate luminal irregularity of the anterior and middle cerebral arteries. POSTERIOR CIRCULATION: Normal appearance of the vertebral arteries, vertebrobasilar junction and basilar artery, as well as main branch vessels. Normal appearance of the posterior cerebral arteries. Fetal origin bilateral posterior cerebral arteries. No large vessel occlusion, hemodynamically significant stenosis, dissection, contrast extravasation or aneurysm. Moderate luminal irregularity of the posterior cerebral arteries. VENOUS SINUSES: Major dural venous sinuses are patent though not tailored for evaluation on this angiographic examination. ANATOMIC VARIANTS: None. DELAYED PHASE: Not performed. IMPRESSION: CT PERFUSION:  No large vascular territory perfusion abnormality. CTA NECK: Atherosclerosis without hemodynamically significant stenosis or acute vascular process. 10 mm carinal lymph node may be re- active, incompletely evaluated.  CTA HEAD:  No emergent large vessel occlusion or severe stenosis. Moderate luminal irregularity of the cerebral arteries compatible with atherosclerosis. Acute findings discussed with and reconfirmed by Dr.MCNEILL [facility] on 10/03/2015 at 6:35 am. Electronically Signed   By: Elon Alas M.D.   On: 10/03/2015 06:45   Mr Brain Wo Contrast  10/03/2015  CLINICAL DATA:  81 year old female with acute left leg weakness in the middle of the night. Code stroke. Initial encounter. EXAM: MRI HEAD WITHOUT CONTRAST TECHNIQUE: Multiplanar, multiecho pulse sequences of the brain and surrounding structures were obtained without intravenous contrast. COMPARISON:  Head CT, CTA, and CT perfusion without contrast 0505 hours today, and earlier. FINDINGS: 15 mm area of confluent restricted diffusion tracking from the posterior aspect of the right  corona radiata toward the posterior right lentiform nuclei (series 10, image 16). Mild associated T2 and FLAIR hyperintensity. No associated hemorrhage or mass effect. No other restricted diffusion. Major intracranial vascular flow voids are preserved. Widespread increased perivascular spaces in the deep gray matter nuclei. Moderate T2 hyperintensity in the thalami compatible with chronic small vessel ischemia worse on the left. Patchy and confluent bilateral cerebral white matter T2 and FLAIR hyperintensity. No cortical encephalomalacia identified. There are occasional chronic micro hemorrhages in the brain, including at the posterior limb of the left external capsule, in the left paracentral pons, and in the left deep cerebellar nuclei. Minimal T2 heterogeneity in the pons. No midline shift, mass effect, evidence of mass lesion, ventriculomegaly, extra-axial collection or acute intracranial hemorrhage. Cervicomedullary junction and pituitary are within normal limits. Negative visualized cervical spine. Visualized bone marrow signal is within normal limits. Visible internal auditory structures appear normal. Trace mastoid fluid. Mild paranasal sinus mucosal thickening. Postoperative changes to both globes. Otherwise negative orbit and scalp soft tissues. IMPRESSION: 1. Acute lacunar type infarct tracking from the posterior right corona radiata to the posterior lentiform nuclei. No associated hemorrhage or mass effect. 2. Underlying chronic small vessel disease, moderate for age. Electronically Signed   By: Genevie Ann M.D.   On: 10/03/2015 12:14   Ct Cerebral Perfusion W Contrast  10/03/2015  CLINICAL DATA:  Assess stroke or blockage. LEFT-sided weakness and dysarthria beginning at 0430 hours. EXAM: CT PERFUSION CT ANGIOGRAPHY OF THE HEAD AND NECK TECHNIQUE: Multidetector CT imaging of the head and neck was performed using the standard protocol during bolus administration of intravenous contrast. Multiplanar CT image  reconstructions and MIPs were obtained to evaluate the vascular anatomy. Carotid stenosis measurements (when applicable) are obtained utilizing NASCET criteria, using the distal internal carotid diameter as the denominator. CT perfusion with off line post processing. CONTRAST:  50 cc Isovue 370 COMPARISON:  CT HEAD October 03, 2015 at 0505 hours FINDINGS: CT PERFUSION No large vascular territory perfusion abnormality. CTA NECK AORTIC ARCH: Normal appearance of the thoracic arch, normal branch pattern. Moderate calcific atherosclerosis. Origin of the arch vessels incompletely imaged. RIGHT CAROTID SYSTEM: Common carotid artery is widely patent, coursing in a straight line fashion. Normal appearance of the carotid bifurcation without hemodynamically significant stenosis by NASCET criteria. Mild eccentric calcific atherosclerosis. Normal appearance of the included internal carotid artery. LEFT CAROTID SYSTEM: Common carotid artery is widely patent, coursing in a straight line fashion. Normal appearance of the carotid bifurcation without hemodynamically significant stenosis by NASCET criteria. Mild eccentric calcific atherosclerosis. Normal appearance of the included internal carotid artery. VERTEBRAL ARTERIES:Left vertebral artery is dominant. Normal appearance of the vertebral arteries, which appear widely patent. SKELETON: No acute osseous process though bone  windows have not been submitted. OTHER NECK: Soft tissues of the neck are non-acute though, not tailored for evaluation. 11 mm RIGHT apical lung calcification. Less than 1 cm in LEFT thyroid nodule is below size followup recommendation. Atrophic submandibular glands. 10 mm carinal lymph node. CTA HEAD ANTERIOR CIRCULATION: Normal appearance of the cervical internal carotid arteries, petrous, cavernous and supra clinoid internal carotid arteries. Widely patent anterior communicating artery. Normal appearance of the anterior and middle cerebral arteries. No large  vessel occlusion, hemodynamically significant stenosis, dissection, contrast extravasation or aneurysm. Moderate luminal irregularity of the anterior and middle cerebral arteries. POSTERIOR CIRCULATION: Normal appearance of the vertebral arteries, vertebrobasilar junction and basilar artery, as well as main branch vessels. Normal appearance of the posterior cerebral arteries. Fetal origin bilateral posterior cerebral arteries. No large vessel occlusion, hemodynamically significant stenosis, dissection, contrast extravasation or aneurysm. Moderate luminal irregularity of the posterior cerebral arteries. VENOUS SINUSES: Major dural venous sinuses are patent though not tailored for evaluation on this angiographic examination. ANATOMIC VARIANTS: None. DELAYED PHASE: Not performed. IMPRESSION: CT PERFUSION:  No large vascular territory perfusion abnormality. CTA NECK: Atherosclerosis without hemodynamically significant stenosis or acute vascular process. 10 mm carinal lymph node may be re- active, incompletely evaluated. CTA HEAD:  No emergent large vessel occlusion or severe stenosis. Moderate luminal irregularity of the cerebral arteries compatible with atherosclerosis. Acute findings discussed with and reconfirmed by Dr.MCNEILL [facility] on 10/03/2015 at 6:35 am. Electronically Signed   By: Elon Alas M.D.   On: 10/03/2015 06:45   Ct Head Code Stroke W/o Cm  10/03/2015  CLINICAL DATA:  Acute onset of left-sided weakness. Code stroke. Initial encounter. EXAM: CT HEAD WITHOUT CONTRAST TECHNIQUE: Contiguous axial images were obtained from the base of the skull through the vertex without intravenous contrast. COMPARISON:  CT of the head performed 11/07/2009 FINDINGS: There is no evidence of acute infarction, mass lesion, or intra- or extra-axial hemorrhage on CT. Prominence of the ventricles and sulci reflects mild cortical volume loss. Scattered periventricular and subcortical white matter change likely reflects  small vessel ischemic microangiopathy. The brainstem and fourth ventricle are within normal limits. The basal ganglia are unremarkable in appearance. The cerebral hemispheres demonstrate grossly normal gray-white differentiation. No mass effect or midline shift is seen. There is no evidence of fracture; visualized osseous structures are unremarkable in appearance. Postoperative change is noted at the left optic globe. The right orbit is unremarkable in appearance. The paranasal sinuses and mastoid air cells are well-aerated. No significant soft tissue abnormalities are seen. IMPRESSION: 1. No acute intracranial pathology seen on CT. 2. Mild cortical volume loss and scattered small vessel ischemic microangiopathy. These results were called by telephone at the time of interpretation on 10/03/2015 at 5:28 am to Dr. Roland Rack, who verbally acknowledged these results. Electronically Signed   By: Garald Balding M.D.   On: 10/03/2015 05:27     Microbiology: No results found for this or any previous visit (from the past 240 hour(s)).   Labs: Basic Metabolic Panel:  Recent Labs Lab 10/03/15 0501 10/03/15 0518  NA 137 140  K 4.2 4.3  CL 107 106  CO2 24  --   GLUCOSE 94 93  BUN 15 16  CREATININE 0.70 0.70  CALCIUM 8.8*  --    Liver Function Tests:  Recent Labs Lab 10/03/15 0501  AST 31  ALT 10*  ALKPHOS 56  BILITOT 0.7  PROT 7.0  ALBUMIN 3.3*   No results for input(s): LIPASE, AMYLASE in the  last 168 hours. No results for input(s): AMMONIA in the last 168 hours. CBC:  Recent Labs Lab 10/03/15 0501 10/03/15 0518  WBC 8.2  --   NEUTROABS 4.0  --   HGB 12.5 12.6  HCT 38.7 37.0  MCV 90.0  --   PLT 235  --    Cardiac Enzymes: No results for input(s): CKTOTAL, CKMB, CKMBINDEX, TROPONINI in the last 168 hours. BNP: Invalid input(s): POCBNP CBG:  Recent Labs Lab 10/03/15 0636  GLUCAP 85    Time coordinating discharge:  Greater than 30 minutes  Signed:  Kayron Hicklin,  Allee Busk, DO [facility] Pager: [phone] 10/04/2015, 5:58 PM

## 2015-10-04 NOTE — H&P (Signed)
Physical Medicine and Rehabilitation Admission H&P    Chief Complaint  Patient presents with  . Code Stroke  : HPI: Karen [name] is a 82 y.o. right handed female with history of Colorectal cancer diagnosed 2010 status post resection followed by Dr. 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 Patient lives with granddaughter and 10-monthold twins. Used an occasional cane prior to admission. Granddaughter works out of the house. Presented 10/03/2015 left sided weakness and slurred speech. Cranial CT scan negative. MRI of the brain showed acute lacunar type infarct tracking from the posterior right corona radiata to the posterior lentiform nuclei. CT angiogram of head and neck no acute findings. Echocardiogram with ejection fraction 637%grade 1 diastolic dysfunction. Patient did not receive TPA. Neurology consulted presently on Plavix for CVA prophylaxis. Subcutaneous Lovenox for DVT prophylaxis. Dysphagia #2 thin liquid diet. Physical and occupational therapy evaluations completed 10/04/2015 with recommendations of physical medicine rehabilitation consult.Patient was admitted for a comprehensive rehabilitation program  ROS Constitutional: Negative for fever and chills.  HENT: Positive for hearing loss.  Eyes: Positive for blurred vision.  Respiratory: Negative for cough and shortness of breath.  Cardiovascular: Negative for chest pain, palpitations and leg swelling.  Gastrointestinal: Positive for constipation. Negative for nausea and vomiting.  Genitourinary: Negative for dysuria and hematuria.  Musculoskeletal: Positive for back pain.  Skin: Negative for rash.  Neurological: Positive for dizziness, focal weakness and weakness. Negative for seizures and headaches.  Psychiatric/Behavioral: Positive for depression.  All other systems reviewed and are negative   Past Medical History  Diagnosis Date  . ANEMIA 10/21/2008  . AORTIC INSUFFICIENCY 11/26/2009  . BACK  PAIN 06/27/2007  . COLON CANCER, PERSONAL HX 02/25/2010  . Diverticulosis of colon (without mention of hemorrhage) 08/14/2006  . GALLSTONES 10/09/2008  . GLAUCOMA NOS 08/14/2006  . HYPERTENSION 08/14/2006  . HYPOKALEMIA, HX OF 11/26/2009  . MELENA, HX OF 10/12/2008  . NEOPLASM, MALIGNANT, COLON, CECUM 10/30/2008  . OCCLUSION, CENTRAL RETINAL ARTERY 08/14/2006  . PERSONAL HX COLONIC POLYPS 10/21/2008  . SYNCOPE, HX OF 08/14/2006  . URI 09/07/2008  . H/O: stroke     behind right eye   Past Surgical History  Procedure Laterality Date  . Appendectomy    . Cataract extraction      bilateral  . Dilation and curettage of uterus    . Tonsillectomy    . Colon surgery      11/20/2008,gallbladder removed  . Ankle fracture surgery  2011    right   Family History  Problem Relation Age of Onset  . Stroke Neg Hx   . Heart disease Neg Hx   . Breast cancer Sister   . Heart attack Father   . Lung cancer Brother    Social History:  reports that she has never smoked. She has never used smokeless tobacco. She reports that she does not drink alcohol or use illicit drugs. Allergies:  Allergies  Allergen Reactions  . Morphine And Related Shortness Of Breath  . Sulfa Antibiotics Itching and Swelling  . Tape Other (See Comments)    PLEASE USE COBAN WRAP/PATIENT BRUISES EASILY AND HER SKIN IS THIN  . Amoxicillin Itching and Rash  . Ciprofloxacin Itching and Rash  . Methocarbamol Itching  . Trimpex [Trimethoprim] Itching and Rash   Medications Prior to Admission  Medication Sig Dispense Refill  . aspirin 81 MG tablet Take 1 tablet (81 mg total) by mouth daily. 90 tablet 6  . atenolol (TENORMIN)  50 MG tablet Take 1 tablet (50 mg total) by mouth daily. 90 tablet 1  . bimatoprost (LUMIGAN) 0.01 % SOLN Place 1 drop into the right eye at bedtime.    . brimonidine (ALPHAGAN P) 0.1 % SOLN Place 1 drop into both eyes every 8 (eight) hours.     . Calcium Carb-Cholecalciferol (CALCIUM 600/VITAMIN D3) 600-800 MG-UNIT  TABS Take 1 tablet by mouth daily.    . diphenhydramine-acetaminophen (TYLENOL PM) 25-500 MG TABS tablet Take 1 tablet by mouth at bedtime.    Marland Kitchen escitalopram (LEXAPRO) 10 MG tablet TAKE 1 TABLET (10 MG TOTAL) BY MOUTH DAILY. 90 tablet 1  . vitamin C (ASCORBIC ACID) 500 MG tablet Take 500 mg by mouth daily.      Home: Home Living Family/patient expects to be discharged to:: Private residence Living Arrangements: Spouse/significant other Available Help at Discharge: Family, Available 24 hours/day Type of Home: House Home Access: Stairs to enter [employer] of Steps: 1 Entrance Stairs-Rails: None Home Layout: One level Bathroom Shower/Tub: Chiropodist: [place]: Civil engineer, contracting, Radio producer - quad Adaptive Equipment: Reacher Additional Comments: Pt lives with granddaughter. Granddaughter has 14 mo twins and has moved in with patients daughter pending closing on a new ranch style house.   Lives With: Family   Functional History: Prior Function Level of Independence: Independent with assistive device(s) Comments: Pt reports she furniture walks in house and uses SPC or RW for community mobility. Pt goes to the gym x4/week and is independent with all ADLs and IADLs.  Functional Status:  Mobility: Bed Mobility Overal bed mobility: Needs Assistance Bed Mobility: Supine to Sit Supine to sit: Min guard General bed mobility comments: struggle to get up to R side of the bed Transfers Overall transfer level: Independent Equipment used: None Transfers: Sit to/from Stand, [employer] Sit to Stand: Min assist Stand pivot transfers: Mod assist, +2 physical assistance General transfer comment: be able to flex knees and knee extension, pt able to shift wait. pt attempting to dance once in standing Ambulation/Gait General Gait Details: not able today    ADL: ADL Overall ADL's : Needs assistance/impaired Eating/Feeding: Minimal assistance,  Sitting Eating/Feeding Details (indicate cue type and reason): difficulty locating utensil due to baseline visual deficits Grooming: Wash/dry hands, Oral care, Minimal assistance, Standing Grooming Details (indicate cue type and reason): assist for LOB Upper Body Bathing: Set up, Sitting Lower Body Bathing: Minimal assistance, Sit to/from stand Upper Body Dressing : Set up, Sitting Lower Body Dressing: Minimal assistance, Sit to/from stand Toilet Transfer: Minimal assistance Toilet Transfer Details (indicate cue type and reason): simulate sit<>Stand eob to chair Toileting- Clothing Manipulation and Hygiene: Min guard, Sit to/from stand Functional mobility during ADLs: Minimal assistance, Cueing for safety General ADL Comments: Pt demonstrates posterior lean and lack of awareness to self in space  Cognition: Cognition Overall Cognitive Status: Impaired/Different from baseline Arousal/Alertness: Awake/alert Orientation Level: Oriented X4 Attention: Focused Focused Attention: Appears intact Memory: Appears intact Awareness: Appears intact Cognition Arousal/Alertness: Awake/alert, Lethargic Behavior During Therapy: WFL for tasks assessed/performed Overall Cognitive Status: Impaired/Different from baseline Area of Impairment: Memory, Safety/judgement, Problem solving Memory: Decreased short-term memory Safety/Judgement: Decreased awareness of safety, Decreased awareness of deficits Problem Solving: Slow processing, Difficulty sequencing, Requires verbal cues General Comments: Pt generally a bit forgetful and often forgets her train of thought in mid-sentence. However, pt is aware of this and attributes it to her serotonin disorder for which she is being treated.  Physical Exam: Blood pressure  173/63, pulse 105, temperature 97.7 F (36.5 C), temperature source Oral, resp. rate 18, height 5' 6" (1.676 m), weight 59.3 kg (130 lb 11.7 oz), SpO2 96 %. Physical Exam Constitutional: She is  oriented to person, place, and time. She appears well-developed and well-nourished.  HENT:  Head: Normocephalic and atraumatic.  Eyes:  Decreased vision right eye Injected sclera R>L  Neck: Normal range of motion. Neck supple. No thyromegaly present.  Cardiovascular: Normal rate and regular rhythm.  Respiratory: Effort normal and breath sounds normal. No respiratory distress.  GI: Soft. Bowel sounds are normal. She exhibits no distension.  Musculoskeletal: She exhibits no edema or tenderness.  Neurological: She is alert and oriented to person, place, and time.  Speech is dysarthric but intelligible.  She makes good eye contact with examiner.  Follows simple commands.  Fair awareness of deficits. DTRs 3+ LLE Sensation intact to light touch HOH Motor: RUE/RLE: 5/5 proximal to distal LUE/LLE: 4/5 proximal to distal  Skin: Skin is warm and dry.  Psychiatric: She has a normal mood and affect. Her behavior is normal  Results for orders placed or performed during the hospital encounter of 10/03/15 (from the past 48 hour(s))  Protime-INR     Status: None   Collection Time: 10/03/15  5:01 AM  Result Value Ref Range   Prothrombin Time 13.6 11.6 - 15.2 seconds   INR 1.02 0.00 - 1.49  APTT     Status: None   Collection Time: 10/03/15  5:01 AM  Result Value Ref Range   aPTT 26 24 - 37 seconds  CBC     Status: None   Collection Time: 10/03/15  5:01 AM  Result Value Ref Range   WBC 8.2 4.0 - 10.5 K/uL   RBC 4.30 3.87 - 5.11 MIL/uL   Hemoglobin 12.5 12.0 - 15.0 g/dL   HCT 38.7 36.0 - 46.0 %   MCV 90.0 78.0 - 100.0 fL   MCH 29.1 26.0 - 34.0 pg   MCHC 32.3 30.0 - 36.0 g/dL   RDW 14.2 11.5 - 15.5 %   Platelets 235 150 - 400 K/uL  Differential     Status: None   Collection Time: 10/03/15  5:01 AM  Result Value Ref Range   Neutrophils Relative % 49 %   Neutro Abs 4.0 1.7 - 7.7 K/uL   Lymphocytes Relative 40 %   Lymphs Abs 3.2 0.7 - 4.0 K/uL   Monocytes Relative 8 %   Monocytes  Absolute 0.7 0.1 - 1.0 K/uL   Eosinophils Relative 3 %   Eosinophils Absolute 0.3 0.0 - 0.7 K/uL   Basophils Relative 0 %   Basophils Absolute 0.0 0.0 - 0.1 K/uL  Comprehensive metabolic panel     Status: Abnormal   Collection Time: 10/03/15  5:01 AM  Result Value Ref Range   Sodium 137 135 - 145 mmol/L   Potassium 4.2 3.5 - 5.1 mmol/L    Comment: SLIGHT HEMOLYSIS   Chloride 107 101 - 111 mmol/L   CO2 24 22 - 32 mmol/L   Glucose, Bld 94 65 - 99 mg/dL   BUN 15 6 - 20 mg/dL   Creatinine, Ser 0.70 0.44 - 1.00 mg/dL   Calcium 8.8 (L) 8.9 - 10.3 mg/dL   Total Protein 7.0 6.5 - 8.1 g/dL   Albumin 3.3 (L) 3.5 - 5.0 g/dL   AST 31 15 - 41 U/L   ALT 10 (L) 14 - 54 U/L   Alkaline Phosphatase 56 38 - 126  U/L   Total Bilirubin 0.7 0.3 - 1.2 mg/dL   GFR calc non Af Amer >60 >60 mL/min   GFR calc Af Amer >60 >60 mL/min    Comment: (NOTE) The eGFR has been calculated using the CKD EPI equation. This calculation has not been validated in all clinical situations. eGFR's persistently <60 mL/min signify possible Chronic Kidney Disease.    Anion gap 6 5 - 15  Hemoglobin A1c     Status: None   Collection Time: 10/03/15  5:01 AM  Result Value Ref Range   Hgb A1c MFr Bld 5.4 4.8 - 5.6 %    Comment: (NOTE)         Pre-diabetes: 5.7 - 6.4         Diabetes: >6.4         Glycemic control for adults with diabetes: <7.0    Mean Plasma Glucose 108 mg/dL    Comment: (NOTE) Performed At: [facility] [address] [place], [place] [id number] Lindon Romp MD PJ:[id number]   I-stat troponin, ED     Status: None   Collection Time: 10/03/15  5:16 AM  Result Value Ref Range   Troponin i, poc 0.00 0.00 - 0.08 ng/mL   Comment 3            Comment: Due to the release kinetics of cTnI, a negative result within the first hours of the onset of symptoms does not rule out myocardial infarction with certainty. If myocardial infarction is still suspected, repeat the test at appropriate  intervals.   I-Stat Chem 8, ED     Status: Abnormal   Collection Time: 10/03/15  5:18 AM  Result Value Ref Range   Sodium 140 135 - 145 mmol/L   Potassium 4.3 3.5 - 5.1 mmol/L   Chloride 106 101 - 111 mmol/L   BUN 16 6 - 20 mg/dL   Creatinine, Ser 0.70 0.44 - 1.00 mg/dL   Glucose, Bld 93 65 - 99 mg/dL   Calcium, Ion 1.10 (L) 1.12 - 1.23 mmol/L   TCO2 25 0 - 100 mmol/L   Hemoglobin 12.6 12.0 - 15.0 g/dL   HCT 37.0 36.0 - 46.0 %  CBG monitoring, ED     Status: None   Collection Time: 10/03/15  6:36 AM  Result Value Ref Range   Glucose-Capillary 85 65 - 99 mg/dL  Lipid panel     Status: Abnormal   Collection Time: 10/04/15  8:01 AM  Result Value Ref Range   Cholesterol 120 0 - 200 mg/dL   Triglycerides 79 <150 mg/dL   HDL 33 (L) >40 mg/dL   Total CHOL/HDL Ratio 3.6 RATIO   VLDL 16 0 - 40 mg/dL   LDL Cholesterol 71 0 - 99 mg/dL    Comment:        Total Cholesterol/HDL:CHD Risk Coronary Heart Disease Risk Table                     Men   Women  1/2 Average Risk   3.4   3.3  Average Risk       5.0   4.4  2 X Average Risk   9.6   7.1  3 X Average Risk  23.4   11.0        Use the calculated Patient Ratio above and the CHD Risk Table to determine the patient's CHD Risk.        ATP III CLASSIFICATION (LDL):  <100  mg/dL   Optimal  100-129  mg/dL   Near or Above                    Optimal  130-159  mg/dL   Borderline  160-189  mg/dL   High  >190     mg/dL   Very High    Ct Angio Head W Or Wo Contrast  10/03/2015  CLINICAL DATA:  Assess stroke or blockage. LEFT-sided weakness and dysarthria beginning at 0430 hours. EXAM: CT PERFUSION CT ANGIOGRAPHY OF THE HEAD AND NECK TECHNIQUE: Multidetector CT imaging of the head and neck was performed using the standard protocol during bolus administration of intravenous contrast. Multiplanar CT image reconstructions and MIPs were obtained to evaluate the vascular anatomy. Carotid stenosis measurements (when applicable) are obtained  utilizing NASCET criteria, using the distal internal carotid diameter as the denominator. CT perfusion with off line post processing. CONTRAST:  50 cc Isovue 370 COMPARISON:  CT HEAD October 03, 2015 at 0505 hours FINDINGS: CT PERFUSION No large vascular territory perfusion abnormality. CTA NECK AORTIC ARCH: Normal appearance of the thoracic arch, normal branch pattern. Moderate calcific atherosclerosis. Origin of the arch vessels incompletely imaged. RIGHT CAROTID SYSTEM: Common carotid artery is widely patent, coursing in a straight line fashion. Normal appearance of the carotid bifurcation without hemodynamically significant stenosis by NASCET criteria. Mild eccentric calcific atherosclerosis. Normal appearance of the included internal carotid artery. LEFT CAROTID SYSTEM: Common carotid artery is widely patent, coursing in a straight line fashion. Normal appearance of the carotid bifurcation without hemodynamically significant stenosis by NASCET criteria. Mild eccentric calcific atherosclerosis. Normal appearance of the included internal carotid artery. VERTEBRAL ARTERIES:Left vertebral artery is dominant. Normal appearance of the vertebral arteries, which appear widely patent. SKELETON: No acute osseous process though bone windows have not been submitted. OTHER NECK: Soft tissues of the neck are non-acute though, not tailored for evaluation. 11 mm RIGHT apical lung calcification. Less than 1 cm in LEFT thyroid nodule is below size followup recommendation. Atrophic submandibular glands. 10 mm carinal lymph node. CTA HEAD ANTERIOR CIRCULATION: Normal appearance of the cervical internal carotid arteries, petrous, cavernous and supra clinoid internal carotid arteries. Widely patent anterior communicating artery. Normal appearance of the anterior and middle cerebral arteries. No large vessel occlusion, hemodynamically significant stenosis, dissection, contrast extravasation or aneurysm. Moderate luminal irregularity of  the anterior and middle cerebral arteries. POSTERIOR CIRCULATION: Normal appearance of the vertebral arteries, vertebrobasilar junction and basilar artery, as well as main branch vessels. Normal appearance of the posterior cerebral arteries. Fetal origin bilateral posterior cerebral arteries. No large vessel occlusion, hemodynamically significant stenosis, dissection, contrast extravasation or aneurysm. Moderate luminal irregularity of the posterior cerebral arteries. VENOUS SINUSES: Major dural venous sinuses are patent though not tailored for evaluation on this angiographic examination. ANATOMIC VARIANTS: None. DELAYED PHASE: Not performed. IMPRESSION: CT PERFUSION:  No large vascular territory perfusion abnormality. CTA NECK: Atherosclerosis without hemodynamically significant stenosis or acute vascular process. 10 mm carinal lymph node may be re- active, incompletely evaluated. CTA HEAD:  No emergent large vessel occlusion or severe stenosis. Moderate luminal irregularity of the cerebral arteries compatible with atherosclerosis. Acute findings discussed with and reconfirmed by Dr.MCNEILL [facility] on 10/03/2015 at 6:35 am. Electronically Signed   By: Elon Alas M.D.   On: 10/03/2015 06:45   Dg Chest 2 View  10/03/2015  CLINICAL DATA:  Left-sided weakness.  Hypertension. EXAM: CHEST  2 VIEW COMPARISON:  Chest radiograph and chest CT April 02, 2015 FINDINGS:  There is a calcified granuloma in the right upper lobe. There is no edema or consolidation. Heart is mildly enlarged with pulmonary vascularity within normal limits. There is atherosclerotic calcification in the aorta. No adenopathy. There is degenerative change in each shoulder. There is also degenerative change in the thoracic spine. IMPRESSION: Mild cardiomegaly. No edema or consolidation. Calcified granuloma right upper lobe. Aortic atherosclerosis. Electronically Signed   By: Lowella Grip III M.D.   On: 10/03/2015 10:13   Ct Angio Neck W  Or Wo Contrast  10/03/2015  CLINICAL DATA:  Assess stroke or blockage. LEFT-sided weakness and dysarthria beginning at 0430 hours. EXAM: CT PERFUSION CT ANGIOGRAPHY OF THE HEAD AND NECK TECHNIQUE: Multidetector CT imaging of the head and neck was performed using the standard protocol during bolus administration of intravenous contrast. Multiplanar CT image reconstructions and MIPs were obtained to evaluate the vascular anatomy. Carotid stenosis measurements (when applicable) are obtained utilizing NASCET criteria, using the distal internal carotid diameter as the denominator. CT perfusion with off line post processing. CONTRAST:  50 cc Isovue 370 COMPARISON:  CT HEAD October 03, 2015 at 0505 hours FINDINGS: CT PERFUSION No large vascular territory perfusion abnormality. CTA NECK AORTIC ARCH: Normal appearance of the thoracic arch, normal branch pattern. Moderate calcific atherosclerosis. Origin of the arch vessels incompletely imaged. RIGHT CAROTID SYSTEM: Common carotid artery is widely patent, coursing in a straight line fashion. Normal appearance of the carotid bifurcation without hemodynamically significant stenosis by NASCET criteria. Mild eccentric calcific atherosclerosis. Normal appearance of the included internal carotid artery. LEFT CAROTID SYSTEM: Common carotid artery is widely patent, coursing in a straight line fashion. Normal appearance of the carotid bifurcation without hemodynamically significant stenosis by NASCET criteria. Mild eccentric calcific atherosclerosis. Normal appearance of the included internal carotid artery. VERTEBRAL ARTERIES:Left vertebral artery is dominant. Normal appearance of the vertebral arteries, which appear widely patent. SKELETON: No acute osseous process though bone windows have not been submitted. OTHER NECK: Soft tissues of the neck are non-acute though, not tailored for evaluation. 11 mm RIGHT apical lung calcification. Less than 1 cm in LEFT thyroid nodule is below size  followup recommendation. Atrophic submandibular glands. 10 mm carinal lymph node. CTA HEAD ANTERIOR CIRCULATION: Normal appearance of the cervical internal carotid arteries, petrous, cavernous and supra clinoid internal carotid arteries. Widely patent anterior communicating artery. Normal appearance of the anterior and middle cerebral arteries. No large vessel occlusion, hemodynamically significant stenosis, dissection, contrast extravasation or aneurysm. Moderate luminal irregularity of the anterior and middle cerebral arteries. POSTERIOR CIRCULATION: Normal appearance of the vertebral arteries, vertebrobasilar junction and basilar artery, as well as main branch vessels. Normal appearance of the posterior cerebral arteries. Fetal origin bilateral posterior cerebral arteries. No large vessel occlusion, hemodynamically significant stenosis, dissection, contrast extravasation or aneurysm. Moderate luminal irregularity of the posterior cerebral arteries. VENOUS SINUSES: Major dural venous sinuses are patent though not tailored for evaluation on this angiographic examination. ANATOMIC VARIANTS: None. DELAYED PHASE: Not performed. IMPRESSION: CT PERFUSION:  No large vascular territory perfusion abnormality. CTA NECK: Atherosclerosis without hemodynamically significant stenosis or acute vascular process. 10 mm carinal lymph node may be re- active, incompletely evaluated. CTA HEAD:  No emergent large vessel occlusion or severe stenosis. Moderate luminal irregularity of the cerebral arteries compatible with atherosclerosis. Acute findings discussed with and reconfirmed by Dr.MCNEILL [facility] on 10/03/2015 at 6:35 am. Electronically Signed   By: Elon Alas M.D.   On: 10/03/2015 06:45   Mr Brain Wo Contrast  10/03/2015  CLINICAL DATA:  82 year old female with acute left leg weakness in the middle of the night. Code stroke. Initial encounter. EXAM: MRI HEAD WITHOUT CONTRAST TECHNIQUE: Multiplanar, multiecho pulse  sequences of the brain and surrounding structures were obtained without intravenous contrast. COMPARISON:  Head CT, CTA, and CT perfusion without contrast 0505 hours today, and earlier. FINDINGS: 15 mm area of confluent restricted diffusion tracking from the posterior aspect of the right corona radiata toward the posterior right lentiform nuclei (series 10, image 16). Mild associated T2 and FLAIR hyperintensity. No associated hemorrhage or mass effect. No other restricted diffusion. Major intracranial vascular flow voids are preserved. Widespread increased perivascular spaces in the deep gray matter nuclei. Moderate T2 hyperintensity in the thalami compatible with chronic small vessel ischemia worse on the left. Patchy and confluent bilateral cerebral white matter T2 and FLAIR hyperintensity. No cortical encephalomalacia identified. There are occasional chronic micro hemorrhages in the brain, including at the posterior limb of the left external capsule, in the left paracentral pons, and in the left deep cerebellar nuclei. Minimal T2 heterogeneity in the pons. No midline shift, mass effect, evidence of mass lesion, ventriculomegaly, extra-axial collection or acute intracranial hemorrhage. Cervicomedullary junction and pituitary are within normal limits. Negative visualized cervical spine. Visualized bone marrow signal is within normal limits. Visible internal auditory structures appear normal. Trace mastoid fluid. Mild paranasal sinus mucosal thickening. Postoperative changes to both globes. Otherwise negative orbit and scalp soft tissues. IMPRESSION: 1. Acute lacunar type infarct tracking from the posterior right corona radiata to the posterior lentiform nuclei. No associated hemorrhage or mass effect. 2. Underlying chronic small vessel disease, moderate for age. Electronically Signed   By: Genevie Ann M.D.   On: 10/03/2015 12:14   Ct Cerebral Perfusion W Contrast  10/03/2015  CLINICAL DATA:  Assess stroke or  blockage. LEFT-sided weakness and dysarthria beginning at 0430 hours. EXAM: CT PERFUSION CT ANGIOGRAPHY OF THE HEAD AND NECK TECHNIQUE: Multidetector CT imaging of the head and neck was performed using the standard protocol during bolus administration of intravenous contrast. Multiplanar CT image reconstructions and MIPs were obtained to evaluate the vascular anatomy. Carotid stenosis measurements (when applicable) are obtained utilizing NASCET criteria, using the distal internal carotid diameter as the denominator. CT perfusion with off line post processing. CONTRAST:  50 cc Isovue 370 COMPARISON:  CT HEAD October 03, 2015 at 0505 hours FINDINGS: CT PERFUSION No large vascular territory perfusion abnormality. CTA NECK AORTIC ARCH: Normal appearance of the thoracic arch, normal branch pattern. Moderate calcific atherosclerosis. Origin of the arch vessels incompletely imaged. RIGHT CAROTID SYSTEM: Common carotid artery is widely patent, coursing in a straight line fashion. Normal appearance of the carotid bifurcation without hemodynamically significant stenosis by NASCET criteria. Mild eccentric calcific atherosclerosis. Normal appearance of the included internal carotid artery. LEFT CAROTID SYSTEM: Common carotid artery is widely patent, coursing in a straight line fashion. Normal appearance of the carotid bifurcation without hemodynamically significant stenosis by NASCET criteria. Mild eccentric calcific atherosclerosis. Normal appearance of the included internal carotid artery. VERTEBRAL ARTERIES:Left vertebral artery is dominant. Normal appearance of the vertebral arteries, which appear widely patent. SKELETON: No acute osseous process though bone windows have not been submitted. OTHER NECK: Soft tissues of the neck are non-acute though, not tailored for evaluation. 11 mm RIGHT apical lung calcification. Less than 1 cm in LEFT thyroid nodule is below size followup recommendation. Atrophic submandibular glands. 10 mm  carinal lymph node. CTA HEAD ANTERIOR CIRCULATION: Normal appearance of the cervical internal carotid arteries, petrous, cavernous  and supra clinoid internal carotid arteries. Widely patent anterior communicating artery. Normal appearance of the anterior and middle cerebral arteries. No large vessel occlusion, hemodynamically significant stenosis, dissection, contrast extravasation or aneurysm. Moderate luminal irregularity of the anterior and middle cerebral arteries. POSTERIOR CIRCULATION: Normal appearance of the vertebral arteries, vertebrobasilar junction and basilar artery, as well as main branch vessels. Normal appearance of the posterior cerebral arteries. Fetal origin bilateral posterior cerebral arteries. No large vessel occlusion, hemodynamically significant stenosis, dissection, contrast extravasation or aneurysm. Moderate luminal irregularity of the posterior cerebral arteries. VENOUS SINUSES: Major dural venous sinuses are patent though not tailored for evaluation on this angiographic examination. ANATOMIC VARIANTS: None. DELAYED PHASE: Not performed. IMPRESSION: CT PERFUSION:  No large vascular territory perfusion abnormality. CTA NECK: Atherosclerosis without hemodynamically significant stenosis or acute vascular process. 10 mm carinal lymph node may be re- active, incompletely evaluated. CTA HEAD:  No emergent large vessel occlusion or severe stenosis. Moderate luminal irregularity of the cerebral arteries compatible with atherosclerosis. Acute findings discussed with and reconfirmed by Dr.MCNEILL [facility] on 10/03/2015 at 6:35 am. Electronically Signed   By: Elon Alas M.D.   On: 10/03/2015 06:45   Ct Head Code Stroke W/o Cm  10/03/2015  CLINICAL DATA:  Acute onset of left-sided weakness. Code stroke. Initial encounter. EXAM: CT HEAD WITHOUT CONTRAST TECHNIQUE: Contiguous axial images were obtained from the base of the skull through the vertex without intravenous contrast. COMPARISON:  CT  of the head performed 11/07/2009 FINDINGS: There is no evidence of acute infarction, mass lesion, or intra- or extra-axial hemorrhage on CT. Prominence of the ventricles and sulci reflects mild cortical volume loss. Scattered periventricular and subcortical white matter change likely reflects small vessel ischemic microangiopathy. The brainstem and fourth ventricle are within normal limits. The basal ganglia are unremarkable in appearance. The cerebral hemispheres demonstrate grossly normal gray-white differentiation. No mass effect or midline shift is seen. There is no evidence of fracture; visualized osseous structures are unremarkable in appearance. Postoperative change is noted at the left optic globe. The right orbit is unremarkable in appearance. The paranasal sinuses and mastoid air cells are well-aerated. No significant soft tissue abnormalities are seen. IMPRESSION: 1. No acute intracranial pathology seen on CT. 2. Mild cortical volume loss and scattered small vessel ischemic microangiopathy. These results were called by telephone at the time of interpretation on 10/03/2015 at 5:28 am to Dr. Roland Rack, who verbally acknowledged these results. Electronically Signed   By: Garald Balding M.D.   On: 10/03/2015 05:27    Medical Problem List and Plan: 1.  Left-sided weakness with dysarthria/dysphagia secondary to right basal ganglia corona radiata infarct 2.  DVT Prophylaxis/Anticoagulation: Subcutaneous Lovenox. Monitor platelet counts or any signs of bleeding 3. Pain Management: Tylenol as needed 4. Dysphagia. Dysphagia #2 thin liquid diet. Follow-up speech therapy 5. Neuropsych: This patient is capable of making decisions on her own behalf. 6. Skin/Wound Care: Routine skin checks 7. Fluids/Electrolytes/Nutrition: Routine I&O's with follow-up chemistries 8. Hypertension. Permissive hypertension. Monitor with increased mobility. Patient on Tenormin 50 mg daily prior to admission. 9. Mood.  Resume Lexapro 10 mg daily 10. Hard of hearing. 11. Decreased vision right eye. 12. Hyperlipidemia. Pravachol 13. History of colorectal cancer diagnosed 2010. Status post resection. Presently in remission. Followed by Dr. Alen Blew   Post Admission Physician Evaluation: 1. Functional deficits secondary  to right basal ganglia corona radiata infarct. 2. Patient is admitted to receive collaborative, interdisciplinary care between the physiatrist, rehab nursing staff, and therapy team. 3.  Patient's level of medical complexity and substantial therapy needs in context of that medical necessity cannot be provided at a lesser intensity of care such as a SNF. 4. Patient has experienced substantial functional loss from his/her baseline which was documented above under the "Functional History" and "Functional Status" headings.  Judging by the patient's diagnosis, physical exam, and functional history, the patient has potential for functional progress which will result in measurable gains while on inpatient rehab.  These gains will be of substantial and practical use upon discharge  in facilitating mobility and self-care at the household level. 5. Physiatrist will provide 24 hour management of medical needs as well as oversight of the therapy plan/treatment and provide guidance as appropriate regarding the interaction of the two. 6. 24 hour rehab nursing will assist with bowel management, safety, disease management and patient education and help integrate therapy concepts, techniques,education, etc. 7. PT will assess and treat for/with: Lower extremity strength, range of motion, stamina, balance, functional mobility, safety, adaptive techniques and equipment, coping skills, pain control, stroke education.   Goals are: Supervision/Min A. 8. OT will assess and treat for/with: ADL's, functional mobility, safety, upper extremity strength, adaptive techniques and equipment, ego support, and community reintegration.    Goals are: Supervision/Min A. Therapy may proceed with showering this patient. 9. SLP will assess and treat for/with: swallowing.  Goals are: Mod I/Ind. 10. Case Management and Social Worker will assess and treat for psychological issues and discharge planning. 11. Team conference will be held weekly to assess progress toward goals and to determine barriers to discharge. 12. Patient will receive at least 3 hours of therapy per day at least 5 days per week. 13. ELOS: 14-18 days.       14. Prognosis:  good  Delice Lesch, MD  10/04/2015

## 2015-10-04 NOTE — Care Management Note (Addendum)
Case Management Note  Patient Details  Name: Karen [name] MRN: [id number] Date of Birth: [birth year]  Subjective/Objective:    Pt admitted with CVA. She is from home with her spouse.                 Action/Plan: Recommendation are for CIR. CM following for discharge disposition.   Expected Discharge Date:                  Expected Discharge Plan:  [facility]  In-House Referral:     Discharge planning Services     Post Acute Care Choice:    Choice offered to:     DME Arranged:    DME Agency:     HH Arranged:    [place] Agency:     Status of Service:  In process, will continue to follow  If discussed at Long Length of Stay Meetings, dates discussed:    Additional Comments:  [name], RN 10/04/2015, 4:06 PM

## 2015-10-04 NOTE — Progress Notes (Signed)
Speech Language Pathology Treatment: Dysphagia  Patient Details Name: Karen [name] MRN: [id number] DOB: 26-Jun-1922 Today's Date: 10/04/2015 Time: [id number] SLP Time Calculation (min) (ACUTE ONLY): 15 min  Assessment / Plan / Recommendation Clinical Impression  Pt seen for assessment of diet tolerance. No overt s/s aspiration observed or reported. Pt reported not liking spicy foods, so she did not care for the sausage, otherwise no issues reported. Pt was encouraged to maintain upright position during any po intake.    HPI HPI: Karen [name] is a 82 y.o. female with a Past Medical History of HTN, glaucoma. She was normal last night, playing cards with family and then woke up in the middle of the night with the inability to move left arm and left leg weakness. Family brought her to the ER. She at baseline is blind and walks with a cane, lives with family.She also reports "choking" on some food last week, and so she immediately failed the RN stroke swallow.       SLP Plan  Continue with current plan of care     Recommendations  Diet recommendations: Dysphagia 2 (fine chop);Thin liquid Liquids provided via: Cup;Straw Medication Administration: Whole meds with liquid Supervision: Staff to assist with self feeding Compensations: Minimize environmental distractions;Slow rate;Small sips/bites Postural Changes and/or Swallow Maneuvers: Seated upright 90 degrees   Plan: Continue with current plan of care     Shonna Chock 10/04/2015, 10:24 AM  [facility] [address] [facility], [place] [phone]

## 2015-10-04 NOTE — Care Management Important Message (Signed)
Important Message  Patient Details  Name: Karen [name] MRN: [id number] Date of Birth: [birth year]   Medicare Important Message Given:  Yes    Loann Quill 10/04/2015, 9:40 AM

## 2015-10-04 NOTE — Evaluation (Signed)
Physical Therapy Evaluation Patient Details Name: Karen [name] MRN: [id number] DOB: 02-17-1923 Today's Date: 10/04/2015   History of Present Illness  82 y.o. female admitted for LUE weakness and difficulty walking. MRI (+) for Acute lacunar type infarct tracking from the posterior right corona radiata to the posterior lentiform nuclei. PMH significant for HTN, anemia, aortic insufficiency, colon cancer, hypokalemia, syncope, CVA (behind R eye), and back pain.  Clinical Impression  Pt admitted with/for L sided weakness.  MRI positive for acute lacunar infarct..  Pt currently limited functionally due to the problems listed. ( See problems list.)   Pt will benefit from PT to maximize function and safety in order to get ready for next venue listed below.     Follow Up Recommendations CIR    Equipment Recommendations   (TBA next venue)    Recommendations for Other Services Rehab consult     Precautions / Restrictions Precautions Precautions: Fall Restrictions Weight Bearing Restrictions: No      Mobility  Bed Mobility Overal bed mobility: Needs Assistance Bed Mobility: Supine to Sit     Supine to sit: Min guard     General bed mobility comments: struggle to get up to R side of the bed  Transfers Overall transfer level: Independent Equipment used: None Transfers: Sit to/from [employer] to Stand: Min assist Stand pivot transfers: Mod assist;+2 physical assistance       General transfer comment: be able to flex knees and knee extension, pt able to shift wait. pt attempting to dance once in standing  Ambulation/Gait             General Gait Details: not able today  Stairs            Wheelchair Mobility    Modified Rankin (Stroke Patients Only)       Balance     Sitting balance-Leahy Scale: Good       Standing balance-Leahy Scale: Poor                               Pertinent Vitals/Pain Pain Assessment:  No/denies pain    Home Living Family/patient expects to be discharged to:: Private residence Living Arrangements: Spouse/significant other Available Help at Discharge: Family;Available 24 hours/day Type of Home: House Home Access: Stairs to enter Entrance Stairs-Rails: None Entrance Stairs-Number of Steps: 1 Home Layout: One level Home Equipment: Shower seat;Cane - quad Additional Comments: Pt lives with granddaughter. Granddaughter has 14 mo twins and has moved in with patients daughter pending closing on a new ranch style house.     Prior Function Level of Independence: Independent with assistive device(s)         Comments: Pt reports she furniture walks in house and uses SPC or RW for community mobility. Pt goes to the gym x4/week and is independent with all ADLs and IADLs.     Hand Dominance   Dominant Hand: Right    Extremity/Trunk Assessment   Upper Extremity Assessment: LUE deficits/detail       LUE Deficits / Details: shoulder flexion 3- out 5, shoulder extension 3+ out 5, elbow flexion 2 out 5 elbow extension 3 out 5, able to complete pronation with weak supination, no grasp present    Lower Extremity Assessment: Defer to PT evaluation RLE Deficits / Details: WFL LLE Deficits / Details: weakness in hip flexors, hams and df at 4/5  Cervical / Trunk Assessment: Kyphotic  Communication  Communication: No difficulties  Cognition Arousal/Alertness: Awake/alert;Lethargic Behavior During Therapy: WFL for tasks assessed/performed Overall Cognitive Status: Impaired/Different from baseline Area of Impairment: Memory;Safety/judgement;Problem solving     Memory: Decreased short-term memory   Safety/Judgement: Decreased awareness of safety;Decreased awareness of deficits   Problem Solving: Slow processing;Difficulty sequencing;Requires verbal cues      General Comments      Exercises        Assessment/Plan    PT Assessment Patient needs continued PT  services  PT Diagnosis Difficulty walking;Generalized weakness (hemiparesis)   PT Problem List Decreased strength;Decreased activity tolerance;Decreased balance;Decreased mobility;Decreased knowledge of use of DME  PT Treatment Interventions Gait training;Functional mobility training;DME instruction;Therapeutic activities;Balance training;Patient/family education;Neuromuscular re-education   PT Goals (Current goals can be found in the Care Plan section) Acute Rehab PT Goals Patient Stated Goal: none stated PT Goal Formulation: With patient Time For Goal Achievement: 10/18/15 Potential to Achieve Goals: Good    Frequency Min 3X/week   Barriers to discharge        Co-evaluation               End of Session   Activity Tolerance: Patient tolerated treatment well Patient left: in chair;with call bell/phone within reach;with family/visitor present Nurse Communication: Mobility status         Time: [id number] PT Time Calculation (min) (ACUTE ONLY): 38 min   Charges:   PT Evaluation $PT Eval Moderate Complexity: 1 Procedure PT Treatments $Therapeutic Activity: 8-22 mins   PT G Codes:        Karen [name], Karen [name] 10/04/2015, 1:33 PM  10/04/2015  Karen [name], PT [phone]  (pager)

## 2015-10-04 NOTE — Progress Notes (Signed)
PROGRESS NOTE  Karen [name] [id number] DOB: [birth year] DOA: 10/03/2015 PCP: Nyoka Cowden, MD  Brief History:  82 y/o female with history of HTN and colon cancer presented with acute onset left-sided weakness when she woke up to go to the bathroom at 4 AM on 10/03/2015. The patient initially felt that her left arm felt "heavy". She had difficulty standing, and her left leg "felt funny". EMS was activated, and initial CT of the brain was negative. Neurology was consulted, and full stroke workup was undertaken. MRI of the brain showed no acute lacunar infarct in the right corona radiata.  Previous medical records reviewed and summarized.  Assessment/Plan: Acute Right corona radiata/basal ganglia infarct -Neurology Consult appreciated -PT/OT evaluation-->CIR -Speech therapy eval--dys 2 with thin -CT brain--neg -MRI brain--right corona radiata infarct -CT angiogram head and neck--b/l fetal PCAs, diminutive BA and b/l BVJs, and b/l VA origin atherosclerosis -Echo--EF 60-65%, grade 1 daily, no source of emboli -LDL--71 -HbA1C--5.4 -Discontinue aspirin, start Plavix 10/04/15-case discussed with Dr. Leonie Man -personally reviewed EKG--sinus, no ST-T changes  Hypertension -We'll allow for permissive hypertension -Patient initially received hydralazine 1 for systolic blood pressure [id number] which brought her systolic blood pressure down to 123. -Discontinue atenolol -Albumin 12.5 g every 6 hours 4 given with improving SBP  Dyslipidemia -LDL 71  -start pravastatin  Dysphagia -dysphagia 2 diet with thin liquids per speech  Colorectal cancer -Stage II colorectal cancer diagnosed in 2010. She had a T3 N0 disease, status post surgical resection. -presently in remission -follow Dr. Alen Blew   Disposition Plan:   CIR 10/05/15 Family Communication:   Family at bedside  Consultants:  Neurology/ PM&R  Code Status:  DNR  DVT Prophylaxis:  [place] Lovenox   Procedures: As  Listed in Progress Note Above  Antibiotics: None    Subjective: Patient remains weak on the left arm and left leg, much better than yesterday. Denies any fevers, chills, tach, chest pain, shortness breath, nausea, vomiting, diarrhea, abdominal pain. No dysuria or hematuria. No rashes. Denies any hematochezia or melena.  Objective: Filed Vitals:   10/04/15 0044 10/04/15 0631 10/04/15 0930 10/04/15 1331  BP: 157/67 161/75 157/69 173/63  Pulse: 68 72 72 105  Temp: 98 F (36.7 C) 98 F (36.7 C) 98 F (36.7 C) 97.7 F (36.5 C)  TempSrc: Oral Oral Oral Oral  Resp: 17 17 18 18   Height:      Weight:      SpO2: 99% 99% 97% 96%    Intake/Output Summary (Last 24 hours) at 10/04/15 1410 Last data filed at 10/04/15 0900  Gross per 24 hour  Intake    240 ml  Output      0 ml  Net    240 ml   Weight change: 0 kg (0 lb) Exam:   General:  Pt is alert, follows commands appropriately, not in acute distress  HEENT: No icterus, No thrush, No neck mass, [place]/AT  Cardiovascular: RRR, S1/S2, no rubs, no gallops  Respiratory: Bibasilar crackles, right greater than left. No wheezing. Good air movement.  Abdomen: Soft/+BS, non tender, non distended, no guarding  Extremities: 1+LE edema, No lymphangitis, No petechiae, No rashes, no synovitis Neuro:  CN II-XII intact, strength 4/5 in RUE, RLE, strength 4-/5 LUE, LLE; sensation intact bilateral; no dysmetria; babinski equivocal    Data Reviewed: I have personally reviewed following labs and imaging studies Basic Metabolic Panel:  Recent Labs Lab 10/03/15 0501 10/03/15 0518  NA 137  140  K 4.2 4.3  CL 107 106  CO2 24  --   GLUCOSE 94 93  BUN 15 16  CREATININE 0.70 0.70  CALCIUM 8.8*  --    Liver Function Tests:  Recent Labs Lab 10/03/15 0501  AST 31  ALT 10*  ALKPHOS 56  BILITOT 0.7  PROT 7.0  ALBUMIN 3.3*   No results for input(s): LIPASE, AMYLASE in the last 168 hours. No results for input(s): AMMONIA in the last  168 hours. Coagulation Profile:  Recent Labs Lab 10/03/15 0501  INR 1.02   CBC:  Recent Labs Lab 10/03/15 0501 10/03/15 0518  WBC 8.2  --   NEUTROABS 4.0  --   HGB 12.5 12.6  HCT 38.7 37.0  MCV 90.0  --   PLT 235  --    Cardiac Enzymes: No results for input(s): CKTOTAL, CKMB, CKMBINDEX, TROPONINI in the last 168 hours. BNP: Invalid input(s): POCBNP CBG:  Recent Labs Lab 10/03/15 0636  GLUCAP 85   HbA1C:  Recent Labs  10/03/15 0501  HGBA1C 5.4   Urine analysis:    Component Value Date/Time   COLORURINE YELLOW 04/17/2015 2310   APPEARANCEUR CLEAR 04/17/2015 2310   LABSPEC 1.017 04/17/2015 2310   PHURINE 6.5 04/17/2015 2310   GLUCOSEU NEGATIVE 04/17/2015 2310   HGBUR NEGATIVE 04/17/2015 2310   BILIRUBINUR NEGATIVE 04/17/2015 2310   KETONESUR NEGATIVE 04/17/2015 2310   PROTEINUR NEGATIVE 04/17/2015 2310   UROBILINOGEN 1.0 05/25/2014 2302   NITRITE NEGATIVE 04/17/2015 2310   LEUKOCYTESUR NEGATIVE 04/17/2015 2310   Sepsis Labs: @LABRCNTIP (procalcitonin:4,lacticidven:4) )No results found for this or any previous visit (from the past 240 hour(s)).   Scheduled Meds: . brimonidine  1 drop Both Eyes Q8H  . clopidogrel  75 mg Oral Daily  . enoxaparin (LOVENOX) injection  40 mg Subcutaneous Q24H  . latanoprost  1 drop Right Eye QHS   Continuous Infusions:   Procedures/Studies: Ct Angio Head W Or Wo Contrast  10/03/2015  CLINICAL DATA:  Assess stroke or blockage. LEFT-sided weakness and dysarthria beginning at 0430 hours. EXAM: CT PERFUSION CT ANGIOGRAPHY OF THE HEAD AND NECK TECHNIQUE: Multidetector CT imaging of the head and neck was performed using the standard protocol during bolus administration of intravenous contrast. Multiplanar CT image reconstructions and MIPs were obtained to evaluate the vascular anatomy. Carotid stenosis measurements (when applicable) are obtained utilizing NASCET criteria, using the distal internal carotid diameter as the  denominator. CT perfusion with off line post processing. CONTRAST:  50 cc Isovue 370 COMPARISON:  CT HEAD October 03, 2015 at 0505 hours FINDINGS: CT PERFUSION No large vascular territory perfusion abnormality. CTA NECK AORTIC ARCH: Normal appearance of the thoracic arch, normal branch pattern. Moderate calcific atherosclerosis. Origin of the arch vessels incompletely imaged. RIGHT CAROTID SYSTEM: Common carotid artery is widely patent, coursing in a straight line fashion. Normal appearance of the carotid bifurcation without hemodynamically significant stenosis by NASCET criteria. Mild eccentric calcific atherosclerosis. Normal appearance of the included internal carotid artery. LEFT CAROTID SYSTEM: Common carotid artery is widely patent, coursing in a straight line fashion. Normal appearance of the carotid bifurcation without hemodynamically significant stenosis by NASCET criteria. Mild eccentric calcific atherosclerosis. Normal appearance of the included internal carotid artery. VERTEBRAL ARTERIES:Left vertebral artery is dominant. Normal appearance of the vertebral arteries, which appear widely patent. SKELETON: No acute osseous process though bone windows have not been submitted. OTHER NECK: Soft tissues of the neck are non-acute though, not tailored for evaluation. 11 mm RIGHT apical  lung calcification. Less than 1 cm in LEFT thyroid nodule is below size followup recommendation. Atrophic submandibular glands. 10 mm carinal lymph node. CTA HEAD ANTERIOR CIRCULATION: Normal appearance of the cervical internal carotid arteries, petrous, cavernous and supra clinoid internal carotid arteries. Widely patent anterior communicating artery. Normal appearance of the anterior and middle cerebral arteries. No large vessel occlusion, hemodynamically significant stenosis, dissection, contrast extravasation or aneurysm. Moderate luminal irregularity of the anterior and middle cerebral arteries. POSTERIOR CIRCULATION: Normal  appearance of the vertebral arteries, vertebrobasilar junction and basilar artery, as well as main branch vessels. Normal appearance of the posterior cerebral arteries. Fetal origin bilateral posterior cerebral arteries. No large vessel occlusion, hemodynamically significant stenosis, dissection, contrast extravasation or aneurysm. Moderate luminal irregularity of the posterior cerebral arteries. VENOUS SINUSES: Major dural venous sinuses are patent though not tailored for evaluation on this angiographic examination. ANATOMIC VARIANTS: None. DELAYED PHASE: Not performed. IMPRESSION: CT PERFUSION:  No large vascular territory perfusion abnormality. CTA NECK: Atherosclerosis without hemodynamically significant stenosis or acute vascular process. 10 mm carinal lymph node may be re- active, incompletely evaluated. CTA HEAD:  No emergent large vessel occlusion or severe stenosis. Moderate luminal irregularity of the cerebral arteries compatible with atherosclerosis. Acute findings discussed with and reconfirmed by Dr.MCNEILL [facility] on 10/03/2015 at 6:35 am. Electronically Signed   By: Elon Alas M.D.   On: 10/03/2015 06:45   Dg Chest 2 View  10/03/2015  CLINICAL DATA:  Left-sided weakness.  Hypertension. EXAM: CHEST  2 VIEW COMPARISON:  Chest radiograph and chest CT April 02, 2015 FINDINGS: There is a calcified granuloma in the right upper lobe. There is no edema or consolidation. Heart is mildly enlarged with pulmonary vascularity within normal limits. There is atherosclerotic calcification in the aorta. No adenopathy. There is degenerative change in each shoulder. There is also degenerative change in the thoracic spine. IMPRESSION: Mild cardiomegaly. No edema or consolidation. Calcified granuloma right upper lobe. Aortic atherosclerosis. Electronically Signed   By: Lowella Grip III M.D.   On: 10/03/2015 10:13   Ct Angio Neck W Or Wo Contrast  10/03/2015  CLINICAL DATA:  Assess stroke or blockage.  LEFT-sided weakness and dysarthria beginning at 0430 hours. EXAM: CT PERFUSION CT ANGIOGRAPHY OF THE HEAD AND NECK TECHNIQUE: Multidetector CT imaging of the head and neck was performed using the standard protocol during bolus administration of intravenous contrast. Multiplanar CT image reconstructions and MIPs were obtained to evaluate the vascular anatomy. Carotid stenosis measurements (when applicable) are obtained utilizing NASCET criteria, using the distal internal carotid diameter as the denominator. CT perfusion with off line post processing. CONTRAST:  50 cc Isovue 370 COMPARISON:  CT HEAD October 03, 2015 at 0505 hours FINDINGS: CT PERFUSION No large vascular territory perfusion abnormality. CTA NECK AORTIC ARCH: Normal appearance of the thoracic arch, normal branch pattern. Moderate calcific atherosclerosis. Origin of the arch vessels incompletely imaged. RIGHT CAROTID SYSTEM: Common carotid artery is widely patent, coursing in a straight line fashion. Normal appearance of the carotid bifurcation without hemodynamically significant stenosis by NASCET criteria. Mild eccentric calcific atherosclerosis. Normal appearance of the included internal carotid artery. LEFT CAROTID SYSTEM: Common carotid artery is widely patent, coursing in a straight line fashion. Normal appearance of the carotid bifurcation without hemodynamically significant stenosis by NASCET criteria. Mild eccentric calcific atherosclerosis. Normal appearance of the included internal carotid artery. VERTEBRAL ARTERIES:Left vertebral artery is dominant. Normal appearance of the vertebral arteries, which appear widely patent. SKELETON: No acute osseous process though bone windows have  not been submitted. OTHER NECK: Soft tissues of the neck are non-acute though, not tailored for evaluation. 11 mm RIGHT apical lung calcification. Less than 1 cm in LEFT thyroid nodule is below size followup recommendation. Atrophic submandibular glands. 10 mm carinal  lymph node. CTA HEAD ANTERIOR CIRCULATION: Normal appearance of the cervical internal carotid arteries, petrous, cavernous and supra clinoid internal carotid arteries. Widely patent anterior communicating artery. Normal appearance of the anterior and middle cerebral arteries. No large vessel occlusion, hemodynamically significant stenosis, dissection, contrast extravasation or aneurysm. Moderate luminal irregularity of the anterior and middle cerebral arteries. POSTERIOR CIRCULATION: Normal appearance of the vertebral arteries, vertebrobasilar junction and basilar artery, as well as main branch vessels. Normal appearance of the posterior cerebral arteries. Fetal origin bilateral posterior cerebral arteries. No large vessel occlusion, hemodynamically significant stenosis, dissection, contrast extravasation or aneurysm. Moderate luminal irregularity of the posterior cerebral arteries. VENOUS SINUSES: Major dural venous sinuses are patent though not tailored for evaluation on this angiographic examination. ANATOMIC VARIANTS: None. DELAYED PHASE: Not performed. IMPRESSION: CT PERFUSION:  No large vascular territory perfusion abnormality. CTA NECK: Atherosclerosis without hemodynamically significant stenosis or acute vascular process. 10 mm carinal lymph node may be re- active, incompletely evaluated. CTA HEAD:  No emergent large vessel occlusion or severe stenosis. Moderate luminal irregularity of the cerebral arteries compatible with atherosclerosis. Acute findings discussed with and reconfirmed by Dr.MCNEILL [facility] on 10/03/2015 at 6:35 am. Electronically Signed   By: Elon Alas M.D.   On: 10/03/2015 06:45   Mr Brain Wo Contrast  10/03/2015  CLINICAL DATA:  82 year old female with acute left leg weakness in the middle of the night. Code stroke. Initial encounter. EXAM: MRI HEAD WITHOUT CONTRAST TECHNIQUE: Multiplanar, multiecho pulse sequences of the brain and surrounding structures were obtained without  intravenous contrast. COMPARISON:  Head CT, CTA, and CT perfusion without contrast 0505 hours today, and earlier. FINDINGS: 15 mm area of confluent restricted diffusion tracking from the posterior aspect of the right corona radiata toward the posterior right lentiform nuclei (series 10, image 16). Mild associated T2 and FLAIR hyperintensity. No associated hemorrhage or mass effect. No other restricted diffusion. Major intracranial vascular flow voids are preserved. Widespread increased perivascular spaces in the deep gray matter nuclei. Moderate T2 hyperintensity in the thalami compatible with chronic small vessel ischemia worse on the left. Patchy and confluent bilateral cerebral white matter T2 and FLAIR hyperintensity. No cortical encephalomalacia identified. There are occasional chronic micro hemorrhages in the brain, including at the posterior limb of the left external capsule, in the left paracentral pons, and in the left deep cerebellar nuclei. Minimal T2 heterogeneity in the pons. No midline shift, mass effect, evidence of mass lesion, ventriculomegaly, extra-axial collection or acute intracranial hemorrhage. Cervicomedullary junction and pituitary are within normal limits. Negative visualized cervical spine. Visualized bone marrow signal is within normal limits. Visible internal auditory structures appear normal. Trace mastoid fluid. Mild paranasal sinus mucosal thickening. Postoperative changes to both globes. Otherwise negative orbit and scalp soft tissues. IMPRESSION: 1. Acute lacunar type infarct tracking from the posterior right corona radiata to the posterior lentiform nuclei. No associated hemorrhage or mass effect. 2. Underlying chronic small vessel disease, moderate for age. Electronically Signed   By: Genevie Ann M.D.   On: 10/03/2015 12:14   Ct Cerebral Perfusion W Contrast  10/03/2015  CLINICAL DATA:  Assess stroke or blockage. LEFT-sided weakness and dysarthria beginning at 0430 hours. EXAM: CT  PERFUSION CT ANGIOGRAPHY OF THE HEAD AND NECK TECHNIQUE: Multidetector  CT imaging of the head and neck was performed using the standard protocol during bolus administration of intravenous contrast. Multiplanar CT image reconstructions and MIPs were obtained to evaluate the vascular anatomy. Carotid stenosis measurements (when applicable) are obtained utilizing NASCET criteria, using the distal internal carotid diameter as the denominator. CT perfusion with off line post processing. CONTRAST:  50 cc Isovue 370 COMPARISON:  CT HEAD October 03, 2015 at 0505 hours FINDINGS: CT PERFUSION No large vascular territory perfusion abnormality. CTA NECK AORTIC ARCH: Normal appearance of the thoracic arch, normal branch pattern. Moderate calcific atherosclerosis. Origin of the arch vessels incompletely imaged. RIGHT CAROTID SYSTEM: Common carotid artery is widely patent, coursing in a straight line fashion. Normal appearance of the carotid bifurcation without hemodynamically significant stenosis by NASCET criteria. Mild eccentric calcific atherosclerosis. Normal appearance of the included internal carotid artery. LEFT CAROTID SYSTEM: Common carotid artery is widely patent, coursing in a straight line fashion. Normal appearance of the carotid bifurcation without hemodynamically significant stenosis by NASCET criteria. Mild eccentric calcific atherosclerosis. Normal appearance of the included internal carotid artery. VERTEBRAL ARTERIES:Left vertebral artery is dominant. Normal appearance of the vertebral arteries, which appear widely patent. SKELETON: No acute osseous process though bone windows have not been submitted. OTHER NECK: Soft tissues of the neck are non-acute though, not tailored for evaluation. 11 mm RIGHT apical lung calcification. Less than 1 cm in LEFT thyroid nodule is below size followup recommendation. Atrophic submandibular glands. 10 mm carinal lymph node. CTA HEAD ANTERIOR CIRCULATION: Normal appearance of the  cervical internal carotid arteries, petrous, cavernous and supra clinoid internal carotid arteries. Widely patent anterior communicating artery. Normal appearance of the anterior and middle cerebral arteries. No large vessel occlusion, hemodynamically significant stenosis, dissection, contrast extravasation or aneurysm. Moderate luminal irregularity of the anterior and middle cerebral arteries. POSTERIOR CIRCULATION: Normal appearance of the vertebral arteries, vertebrobasilar junction and basilar artery, as well as main branch vessels. Normal appearance of the posterior cerebral arteries. Fetal origin bilateral posterior cerebral arteries. No large vessel occlusion, hemodynamically significant stenosis, dissection, contrast extravasation or aneurysm. Moderate luminal irregularity of the posterior cerebral arteries. VENOUS SINUSES: Major dural venous sinuses are patent though not tailored for evaluation on this angiographic examination. ANATOMIC VARIANTS: None. DELAYED PHASE: Not performed. IMPRESSION: CT PERFUSION:  No large vascular territory perfusion abnormality. CTA NECK: Atherosclerosis without hemodynamically significant stenosis or acute vascular process. 10 mm carinal lymph node may be re- active, incompletely evaluated. CTA HEAD:  No emergent large vessel occlusion or severe stenosis. Moderate luminal irregularity of the cerebral arteries compatible with atherosclerosis. Acute findings discussed with and reconfirmed by Dr.MCNEILL [facility] on 10/03/2015 at 6:35 am. Electronically Signed   By: Elon Alas M.D.   On: 10/03/2015 06:45   Ct Head Code Stroke W/o Cm  10/03/2015  CLINICAL DATA:  Acute onset of left-sided weakness. Code stroke. Initial encounter. EXAM: CT HEAD WITHOUT CONTRAST TECHNIQUE: Contiguous axial images were obtained from the base of the skull through the vertex without intravenous contrast. COMPARISON:  CT of the head performed 11/07/2009 FINDINGS: There is no evidence of acute  infarction, mass lesion, or intra- or extra-axial hemorrhage on CT. Prominence of the ventricles and sulci reflects mild cortical volume loss. Scattered periventricular and subcortical white matter change likely reflects small vessel ischemic microangiopathy. The brainstem and fourth ventricle are within normal limits. The basal ganglia are unremarkable in appearance. The cerebral hemispheres demonstrate grossly normal gray-white differentiation. No mass effect or midline shift is seen. There is no  evidence of fracture; visualized osseous structures are unremarkable in appearance. Postoperative change is noted at the left optic globe. The right orbit is unremarkable in appearance. The paranasal sinuses and mastoid air cells are well-aerated. No significant soft tissue abnormalities are seen. IMPRESSION: 1. No acute intracranial pathology seen on CT. 2. Mild cortical volume loss and scattered small vessel ischemic microangiopathy. These results were called by telephone at the time of interpretation on 10/03/2015 at 5:28 am to Dr. Roland Rack, who verbally acknowledged these results. Electronically Signed   By: Garald Balding M.D.   On: 10/03/2015 05:27    Armandina Iman, DO  [facility] Pager [phone]  If 7PM-7AM, please contact night-coverage [email or web address] Password [email or web address] 10/04/2015, 2:10 PM   LOS: 1 day

## 2015-10-04 NOTE — Consult Note (Signed)
Physical Medicine and Rehabilitation Consult Reason for Consult: Right basal ganglia corona radiata infarct Referring Physician: [facility]   HPI: Karen [name] is a 82 y.o. 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 Patient lives with granddaughter and 80-month-old twins. Used an occasional cane prior to admission. Granddaughter works out of the house. Presented 10/03/2015 left sided weakness and slurred speech. Cranial CT scan negative. MRI of the brain showed acute lacunar type infarct tracking from the posterior right corona radiata to the posterior lentiform nuclei. CT angiogram of head and neck no acute findings. Echocardiogram with ejection fraction [id number] grade 1 diastolic dysfunction. Patient did not receive TPA. Neurology consulted presently on Plavix for CVA prophylaxis. Subcutaneous Lovenox for DVT prophylaxis. Dysphagia #2 thin liquid diet. Physical and occupational therapy evaluations completed 10/04/2015 with recommendations of physical medicine rehabilitation consult.   Review of Systems  Constitutional: Negative for fever and chills.  HENT: Positive for hearing loss.   Eyes: Positive for blurred vision.  Respiratory: Negative for cough and shortness of breath.   Cardiovascular: Negative for chest pain, palpitations and leg swelling.  Gastrointestinal: Positive for constipation. Negative for nausea and vomiting.  Genitourinary: Negative for dysuria and hematuria.  Musculoskeletal: Positive for back pain.  Skin: Negative for rash.  Neurological: Positive for dizziness, focal weakness and weakness. Negative for seizures and headaches.  Psychiatric/Behavioral: Positive for depression.  All other systems reviewed and are negative.  Past Medical History  Diagnosis Date  . ANEMIA 10/21/2008  . AORTIC INSUFFICIENCY 11/26/2009  . BACK PAIN 06/27/2007  . COLON CANCER, PERSONAL HX 02/25/2010  . Diverticulosis of colon (without mention  of hemorrhage) 08/14/2006  . GALLSTONES 10/09/2008  . GLAUCOMA NOS 08/14/2006  . HYPERTENSION 08/14/2006  . HYPOKALEMIA, HX OF 11/26/2009  . MELENA, HX OF 10/12/2008  . NEOPLASM, MALIGNANT, COLON, CECUM 10/30/2008  . OCCLUSION, CENTRAL RETINAL ARTERY 08/14/2006  . PERSONAL HX COLONIC POLYPS 10/21/2008  . SYNCOPE, HX OF 08/14/2006  . URI 09/07/2008  . H/O: stroke     behind right eye   Past Surgical History  Procedure Laterality Date  . Appendectomy    . Cataract extraction      bilateral  . Dilation and curettage of uterus    . Tonsillectomy    . Colon surgery      11/20/2008,gallbladder removed  . Ankle fracture surgery  2011    right   Family History  Problem Relation Age of Onset  . Stroke Neg Hx   . Heart disease Neg Hx   . Breast cancer Sister   . Heart attack Father   . Lung cancer Brother    Social History:  reports that she has never smoked. She has never used smokeless tobacco. She reports that she does not drink alcohol or use illicit drugs. Allergies:  Allergies  Allergen Reactions  . Morphine And Related Shortness Of Breath  . Sulfa Antibiotics Itching and Swelling  . Tape Other (See Comments)    PLEASE USE COBAN WRAP/PATIENT BRUISES EASILY AND HER SKIN IS THIN  . Amoxicillin Itching and Rash  . Ciprofloxacin Itching and Rash  . Methocarbamol Itching  . Trimpex [Trimethoprim] Itching and Rash   Medications Prior to Admission  Medication Sig Dispense Refill  . aspirin 81 MG tablet Take 1 tablet (81 mg total) by mouth daily. 90 tablet 6  . atenolol (TENORMIN) 50 MG tablet Take 1 tablet (50 mg total) by mouth daily. [place]  tablet 1  . bimatoprost (LUMIGAN) 0.01 % SOLN Place 1 drop into the right eye at bedtime.    . brimonidine (ALPHAGAN P) 0.1 % SOLN Place 1 drop into both eyes every 8 (eight) hours.     . Calcium Carb-Cholecalciferol (CALCIUM 600/VITAMIN D3) 600-800 MG-UNIT TABS Take 1 tablet by mouth daily.    . diphenhydramine-acetaminophen (TYLENOL PM) 25-500 MG TABS  tablet Take 1 tablet by mouth at bedtime.    Marland Kitchen escitalopram (LEXAPRO) 10 MG tablet TAKE 1 TABLET (10 MG TOTAL) BY MOUTH DAILY. 90 tablet 1  . vitamin C (ASCORBIC ACID) 500 MG tablet Take 500 mg by mouth daily.      Home: Home Living Family/patient expects to be discharged to:: Private residence Living Arrangements: Spouse/significant other Available Help at Discharge: Family, Available 24 hours/day Type of Home: House Home Access: Stairs to enter [employer] of Steps: 1 Entrance Stairs-Rails: None Home Layout: One level Bathroom Shower/Tub: Chiropodist: [place]: Civil engineer, contracting, Radio producer - quad Adaptive Equipment: Reacher Additional Comments: Pt lives with granddaughter. Granddaughter has 14 mo twins and has moved in with patients daughter pending closing on a new ranch style house.   Lives With: Family  Functional History: Prior Function Level of Independence: Independent with assistive device(s) Comments: Pt reports she furniture walks in house and uses SPC or RW for community mobility. Pt goes to the gym x4/week and is independent with all ADLs and IADLs. Functional Status:  Mobility: Bed Mobility Overal bed mobility: Needs Assistance Bed Mobility: Supine to Sit Supine to sit: Min guard General bed mobility comments: struggle to get up to R side of the bed Transfers Overall transfer level: Independent Equipment used: None Transfers: Sit to/from Stand, [employer] Sit to Stand: Min assist Stand pivot transfers: Mod assist, +2 physical assistance General transfer comment: be able to flex knees and knee extension, pt able to shift wait. pt attempting to dance once in standing Ambulation/Gait General Gait Details: not able today    ADL: ADL Overall ADL's : Needs assistance/impaired Eating/Feeding: Minimal assistance, Sitting Eating/Feeding Details (indicate cue type and reason): difficulty locating utensil due to baseline  visual deficits Grooming: Wash/dry hands, Oral care, Minimal assistance, Standing Grooming Details (indicate cue type and reason): assist for LOB Upper Body Bathing: Set up, Sitting Lower Body Bathing: Minimal assistance, Sit to/from stand Upper Body Dressing : Set up, Sitting Lower Body Dressing: Minimal assistance, Sit to/from stand Toilet Transfer: Minimal assistance Toilet Transfer Details (indicate cue type and reason): simulate sit<>Stand eob to chair Toileting- Clothing Manipulation and Hygiene: Min guard, Sit to/from stand Functional mobility during ADLs: Minimal assistance, Cueing for safety General ADL Comments: Pt demonstrates posterior lean and lack of awareness to self in space  Cognition: Cognition Overall Cognitive Status: Impaired/Different from baseline Arousal/Alertness: Awake/alert Orientation Level: Oriented X4 Attention: Focused Focused Attention: Appears intact Memory: Appears intact Awareness: Appears intact Cognition Arousal/Alertness: Awake/alert, Lethargic Behavior During Therapy: WFL for tasks assessed/performed Overall Cognitive Status: Impaired/Different from baseline Area of Impairment: Memory, Safety/judgement, Problem solving Memory: Decreased short-term memory Safety/Judgement: Decreased awareness of safety, Decreased awareness of deficits Problem Solving: Slow processing, Difficulty sequencing, Requires verbal cues General Comments: Pt generally a bit forgetful and often forgets her train of thought in mid-sentence. However, pt is aware of this and attributes it to her serotonin disorder for which she is being treated.  Blood pressure 173/63, pulse 105, temperature 97.7 F (36.5 C), temperature source Oral, resp. rate 18, height 5\' 6"  (  1.676 m), weight 59.3 kg (130 lb 11.7 oz), SpO2 96 %. Physical Exam  Vitals reviewed. Constitutional: She is oriented to person, place, and time. She appears well-developed and well-nourished.  HENT:  Head:  Normocephalic and atraumatic.  Eyes:  Decreased vision right eye Injected sclera R>L  Neck: Normal range of motion. Neck supple. No thyromegaly present.  Cardiovascular: Normal rate and regular rhythm.   Respiratory: Effort normal and breath sounds normal. No respiratory distress.  GI: Soft. Bowel sounds are normal. She exhibits no distension.  Musculoskeletal: She exhibits no edema or tenderness.  Neurological: She is alert and oriented to person, place, and time.  Speech is dysarthric but intelligible.  She makes good eye contact with examiner.  Follows simple commands.  Fair awareness of deficits. DTRs 3+ LLE Sensation intact to light touch HOH Motor: RUE/RLE: 5/5 proximal to distal LUE/LLE:4/5 proximal to distal  Skin: Skin is warm and dry.  Psychiatric: She has a normal mood and affect. Her behavior is normal.    Results for orders placed or performed during the hospital encounter of 10/03/15 (from the past 24 hour(s))  Lipid panel     Status: Abnormal   Collection Time: 10/04/15  8:01 AM  Result Value Ref Range   Cholesterol 120 0 - 200 mg/dL   Triglycerides 79 <150 mg/dL   HDL 33 (L) >40 mg/dL   Total CHOL/HDL Ratio 3.6 RATIO   VLDL 16 0 - 40 mg/dL   LDL Cholesterol 71 0 - 99 mg/dL   Ct Angio Head W Or Wo Contrast  10/03/2015  CLINICAL DATA:  Assess stroke or blockage. LEFT-sided weakness and dysarthria beginning at 0430 hours. EXAM: CT PERFUSION CT ANGIOGRAPHY OF THE HEAD AND NECK TECHNIQUE: Multidetector CT imaging of the head and neck was performed using the standard protocol during bolus administration of intravenous contrast. Multiplanar CT image reconstructions and MIPs were obtained to evaluate the vascular anatomy. Carotid stenosis measurements (when applicable) are obtained utilizing NASCET criteria, using the distal internal carotid diameter as the denominator. CT perfusion with off line post processing. CONTRAST:  50 cc Isovue 370 COMPARISON:  CT HEAD October 03, 2015  at 0505 hours FINDINGS: CT PERFUSION No large vascular territory perfusion abnormality. CTA NECK AORTIC ARCH: Normal appearance of the thoracic arch, normal branch pattern. Moderate calcific atherosclerosis. Origin of the arch vessels incompletely imaged. RIGHT CAROTID SYSTEM: Common carotid artery is widely patent, coursing in a straight line fashion. Normal appearance of the carotid bifurcation without hemodynamically significant stenosis by NASCET criteria. Mild eccentric calcific atherosclerosis. Normal appearance of the included internal carotid artery. LEFT CAROTID SYSTEM: Common carotid artery is widely patent, coursing in a straight line fashion. Normal appearance of the carotid bifurcation without hemodynamically significant stenosis by NASCET criteria. Mild eccentric calcific atherosclerosis. Normal appearance of the included internal carotid artery. VERTEBRAL ARTERIES:Left vertebral artery is dominant. Normal appearance of the vertebral arteries, which appear widely patent. SKELETON: No acute osseous process though bone windows have not been submitted. OTHER NECK: Soft tissues of the neck are non-acute though, not tailored for evaluation. 11 mm RIGHT apical lung calcification. Less than 1 cm in LEFT thyroid nodule is below size followup recommendation. Atrophic submandibular glands. 10 mm carinal lymph node. CTA HEAD ANTERIOR CIRCULATION: Normal appearance of the cervical internal carotid arteries, petrous, cavernous and supra clinoid internal carotid arteries. Widely patent anterior communicating artery. Normal appearance of the anterior and middle cerebral arteries. No large vessel occlusion, hemodynamically significant stenosis, dissection, contrast extravasation or aneurysm.  Moderate luminal irregularity of the anterior and middle cerebral arteries. POSTERIOR CIRCULATION: Normal appearance of the vertebral arteries, vertebrobasilar junction and basilar artery, as well as main branch vessels. Normal  appearance of the posterior cerebral arteries. Fetal origin bilateral posterior cerebral arteries. No large vessel occlusion, hemodynamically significant stenosis, dissection, contrast extravasation or aneurysm. Moderate luminal irregularity of the posterior cerebral arteries. VENOUS SINUSES: Major dural venous sinuses are patent though not tailored for evaluation on this angiographic examination. ANATOMIC VARIANTS: None. DELAYED PHASE: Not performed. IMPRESSION: CT PERFUSION:  No large vascular territory perfusion abnormality. CTA NECK: Atherosclerosis without hemodynamically significant stenosis or acute vascular process. 10 mm carinal lymph node may be re- active, incompletely evaluated. CTA HEAD:  No emergent large vessel occlusion or severe stenosis. Moderate luminal irregularity of the cerebral arteries compatible with atherosclerosis. Acute findings discussed with and reconfirmed by Dr.MCNEILL [facility] on 10/03/2015 at 6:35 am. Electronically Signed   By: Elon Alas M.D.   On: 10/03/2015 06:45   Dg Chest 2 View  10/03/2015  CLINICAL DATA:  Left-sided weakness.  Hypertension. EXAM: CHEST  2 VIEW COMPARISON:  Chest radiograph and chest CT April 02, 2015 FINDINGS: There is a calcified granuloma in the right upper lobe. There is no edema or consolidation. Heart is mildly enlarged with pulmonary vascularity within normal limits. There is atherosclerotic calcification in the aorta. No adenopathy. There is degenerative change in each shoulder. There is also degenerative change in the thoracic spine. IMPRESSION: Mild cardiomegaly. No edema or consolidation. Calcified granuloma right upper lobe. Aortic atherosclerosis. Electronically Signed   By: Lowella Grip III M.D.   On: 10/03/2015 10:13   Ct Angio Neck W Or Wo Contrast  10/03/2015  CLINICAL DATA:  Assess stroke or blockage. LEFT-sided weakness and dysarthria beginning at 0430 hours. EXAM: CT PERFUSION CT ANGIOGRAPHY OF THE HEAD AND NECK  TECHNIQUE: Multidetector CT imaging of the head and neck was performed using the standard protocol during bolus administration of intravenous contrast. Multiplanar CT image reconstructions and MIPs were obtained to evaluate the vascular anatomy. Carotid stenosis measurements (when applicable) are obtained utilizing NASCET criteria, using the distal internal carotid diameter as the denominator. CT perfusion with off line post processing. CONTRAST:  50 cc Isovue 370 COMPARISON:  CT HEAD October 03, 2015 at 0505 hours FINDINGS: CT PERFUSION No large vascular territory perfusion abnormality. CTA NECK AORTIC ARCH: Normal appearance of the thoracic arch, normal branch pattern. Moderate calcific atherosclerosis. Origin of the arch vessels incompletely imaged. RIGHT CAROTID SYSTEM: Common carotid artery is widely patent, coursing in a straight line fashion. Normal appearance of the carotid bifurcation without hemodynamically significant stenosis by NASCET criteria. Mild eccentric calcific atherosclerosis. Normal appearance of the included internal carotid artery. LEFT CAROTID SYSTEM: Common carotid artery is widely patent, coursing in a straight line fashion. Normal appearance of the carotid bifurcation without hemodynamically significant stenosis by NASCET criteria. Mild eccentric calcific atherosclerosis. Normal appearance of the included internal carotid artery. VERTEBRAL ARTERIES:Left vertebral artery is dominant. Normal appearance of the vertebral arteries, which appear widely patent. SKELETON: No acute osseous process though bone windows have not been submitted. OTHER NECK: Soft tissues of the neck are non-acute though, not tailored for evaluation. 11 mm RIGHT apical lung calcification. Less than 1 cm in LEFT thyroid nodule is below size followup recommendation. Atrophic submandibular glands. 10 mm carinal lymph node. CTA HEAD ANTERIOR CIRCULATION: Normal appearance of the cervical internal carotid arteries, petrous,  cavernous and supra clinoid internal carotid arteries. Widely patent anterior communicating  artery. Normal appearance of the anterior and middle cerebral arteries. No large vessel occlusion, hemodynamically significant stenosis, dissection, contrast extravasation or aneurysm. Moderate luminal irregularity of the anterior and middle cerebral arteries. POSTERIOR CIRCULATION: Normal appearance of the vertebral arteries, vertebrobasilar junction and basilar artery, as well as main branch vessels. Normal appearance of the posterior cerebral arteries. Fetal origin bilateral posterior cerebral arteries. No large vessel occlusion, hemodynamically significant stenosis, dissection, contrast extravasation or aneurysm. Moderate luminal irregularity of the posterior cerebral arteries. VENOUS SINUSES: Major dural venous sinuses are patent though not tailored for evaluation on this angiographic examination. ANATOMIC VARIANTS: None. DELAYED PHASE: Not performed. IMPRESSION: CT PERFUSION:  No large vascular territory perfusion abnormality. CTA NECK: Atherosclerosis without hemodynamically significant stenosis or acute vascular process. 10 mm carinal lymph node may be re- active, incompletely evaluated. CTA HEAD:  No emergent large vessel occlusion or severe stenosis. Moderate luminal irregularity of the cerebral arteries compatible with atherosclerosis. Acute findings discussed with and reconfirmed by Dr.MCNEILL [facility] on 10/03/2015 at 6:35 am. Electronically Signed   By: Elon Alas M.D.   On: 10/03/2015 06:45   Mr Brain Wo Contrast  10/03/2015  CLINICAL DATA:  82 year old female with acute left leg weakness in the middle of the night. Code stroke. Initial encounter. EXAM: MRI HEAD WITHOUT CONTRAST TECHNIQUE: Multiplanar, multiecho pulse sequences of the brain and surrounding structures were obtained without intravenous contrast. COMPARISON:  Head CT, CTA, and CT perfusion without contrast 0505 hours today, and earlier.  FINDINGS: 15 mm area of confluent restricted diffusion tracking from the posterior aspect of the right corona radiata toward the posterior right lentiform nuclei (series 10, image 16). Mild associated T2 and FLAIR hyperintensity. No associated hemorrhage or mass effect. No other restricted diffusion. Major intracranial vascular flow voids are preserved. Widespread increased perivascular spaces in the deep gray matter nuclei. Moderate T2 hyperintensity in the thalami compatible with chronic small vessel ischemia worse on the left. Patchy and confluent bilateral cerebral white matter T2 and FLAIR hyperintensity. No cortical encephalomalacia identified. There are occasional chronic micro hemorrhages in the brain, including at the posterior limb of the left external capsule, in the left paracentral pons, and in the left deep cerebellar nuclei. Minimal T2 heterogeneity in the pons. No midline shift, mass effect, evidence of mass lesion, ventriculomegaly, extra-axial collection or acute intracranial hemorrhage. Cervicomedullary junction and pituitary are within normal limits. Negative visualized cervical spine. Visualized bone marrow signal is within normal limits. Visible internal auditory structures appear normal. Trace mastoid fluid. Mild paranasal sinus mucosal thickening. Postoperative changes to both globes. Otherwise negative orbit and scalp soft tissues. IMPRESSION: 1. Acute lacunar type infarct tracking from the posterior right corona radiata to the posterior lentiform nuclei. No associated hemorrhage or mass effect. 2. Underlying chronic small vessel disease, moderate for age. Electronically Signed   By: Genevie Ann M.D.   On: 10/03/2015 12:14   Ct Cerebral Perfusion W Contrast  10/03/2015  CLINICAL DATA:  Assess stroke or blockage. LEFT-sided weakness and dysarthria beginning at 0430 hours. EXAM: CT PERFUSION CT ANGIOGRAPHY OF THE HEAD AND NECK TECHNIQUE: Multidetector CT imaging of the head and neck was  performed using the standard protocol during bolus administration of intravenous contrast. Multiplanar CT image reconstructions and MIPs were obtained to evaluate the vascular anatomy. Carotid stenosis measurements (when applicable) are obtained utilizing NASCET criteria, using the distal internal carotid diameter as the denominator. CT perfusion with off line post processing. CONTRAST:  50 cc Isovue 370 COMPARISON:  CT HEAD October 03, 2015 at 0505 hours FINDINGS: CT PERFUSION No large vascular territory perfusion abnormality. CTA NECK AORTIC ARCH: Normal appearance of the thoracic arch, normal branch pattern. Moderate calcific atherosclerosis. Origin of the arch vessels incompletely imaged. RIGHT CAROTID SYSTEM: Common carotid artery is widely patent, coursing in a straight line fashion. Normal appearance of the carotid bifurcation without hemodynamically significant stenosis by NASCET criteria. Mild eccentric calcific atherosclerosis. Normal appearance of the included internal carotid artery. LEFT CAROTID SYSTEM: Common carotid artery is widely patent, coursing in a straight line fashion. Normal appearance of the carotid bifurcation without hemodynamically significant stenosis by NASCET criteria. Mild eccentric calcific atherosclerosis. Normal appearance of the included internal carotid artery. VERTEBRAL ARTERIES:Left vertebral artery is dominant. Normal appearance of the vertebral arteries, which appear widely patent. SKELETON: No acute osseous process though bone windows have not been submitted. OTHER NECK: Soft tissues of the neck are non-acute though, not tailored for evaluation. 11 mm RIGHT apical lung calcification. Less than 1 cm in LEFT thyroid nodule is below size followup recommendation. Atrophic submandibular glands. 10 mm carinal lymph node. CTA HEAD ANTERIOR CIRCULATION: Normal appearance of the cervical internal carotid arteries, petrous, cavernous and supra clinoid internal carotid arteries. Widely  patent anterior communicating artery. Normal appearance of the anterior and middle cerebral arteries. No large vessel occlusion, hemodynamically significant stenosis, dissection, contrast extravasation or aneurysm. Moderate luminal irregularity of the anterior and middle cerebral arteries. POSTERIOR CIRCULATION: Normal appearance of the vertebral arteries, vertebrobasilar junction and basilar artery, as well as main branch vessels. Normal appearance of the posterior cerebral arteries. Fetal origin bilateral posterior cerebral arteries. No large vessel occlusion, hemodynamically significant stenosis, dissection, contrast extravasation or aneurysm. Moderate luminal irregularity of the posterior cerebral arteries. VENOUS SINUSES: Major dural venous sinuses are patent though not tailored for evaluation on this angiographic examination. ANATOMIC VARIANTS: None. DELAYED PHASE: Not performed. IMPRESSION: CT PERFUSION:  No large vascular territory perfusion abnormality. CTA NECK: Atherosclerosis without hemodynamically significant stenosis or acute vascular process. 10 mm carinal lymph node may be re- active, incompletely evaluated. CTA HEAD:  No emergent large vessel occlusion or severe stenosis. Moderate luminal irregularity of the cerebral arteries compatible with atherosclerosis. Acute findings discussed with and reconfirmed by Dr.MCNEILL [facility] on 10/03/2015 at 6:35 am. Electronically Signed   By: Elon Alas M.D.   On: 10/03/2015 06:45   Ct Head Code Stroke W/o Cm  10/03/2015  CLINICAL DATA:  Acute onset of left-sided weakness. Code stroke. Initial encounter. EXAM: CT HEAD WITHOUT CONTRAST TECHNIQUE: Contiguous axial images were obtained from the base of the skull through the vertex without intravenous contrast. COMPARISON:  CT of the head performed 11/07/2009 FINDINGS: There is no evidence of acute infarction, mass lesion, or intra- or extra-axial hemorrhage on CT. Prominence of the ventricles and sulci  reflects mild cortical volume loss. Scattered periventricular and subcortical white matter change likely reflects small vessel ischemic microangiopathy. The brainstem and fourth ventricle are within normal limits. The basal ganglia are unremarkable in appearance. The cerebral hemispheres demonstrate grossly normal gray-white differentiation. No mass effect or midline shift is seen. There is no evidence of fracture; visualized osseous structures are unremarkable in appearance. Postoperative change is noted at the left optic globe. The right orbit is unremarkable in appearance. The paranasal sinuses and mastoid air cells are well-aerated. No significant soft tissue abnormalities are seen. IMPRESSION: 1. No acute intracranial pathology seen on CT. 2. Mild cortical volume loss and scattered small vessel ischemic microangiopathy. These results were called by telephone at  the time of interpretation on 10/03/2015 at 5:28 am to Dr. Roland Rack, who verbally acknowledged these results. Electronically Signed   By: Garald Balding M.D.   On: 10/03/2015 05:27    Assessment/Plan: Diagnosis: Right basal ganglia corona radiata infarct Labs and images independently reviewed.  Records reviewed and summated above. Stroke: Continue secondary stroke prophylaxis and Risk Factor Modification listed below:   Antiplatelet therapy:   Blood Pressure Management:  Continue current medication with prn's with permisive HTN per primary team Statin Agent:   Left sided hemiparesis: fit for orthosis to prevent contractures (resting hand splint for day, wrist cock up splint at night, PRAFO, etc Motor recovery: Fluoxetine  1. Does the need for close, 24 hr/day medical supervision in concert with the patient's rehab needs make it unreasonable for this patient to be served in a less intensive setting? Yes  2. Co-Morbidities requiring supervision/potential complications: HTN (monitor and provide prns in accordance with increased  physical exertion and pain), blindness to right eye, hard of hearing (accomodations), dysphagia (Cont SLP, advance diet as tolerated), Tachycardia (monitor in accordance with pain and increasing activity) 3. Due to bowel management, safety, disease management and patient education, does the patient require 24 hr/day rehab nursing? Yes 4. Does the patient require coordinated care of a physician, rehab nurse, PT (1-2 hrs/day, 5 days/week), OT (1-2 hrs/day, 51-2 days/week) and SLP (5 hrs/day, 1-2 days/week) to address physical and functional deficits in the context of the above medical diagnosis(es)? Yes Addressing deficits in the following areas: balance, endurance, locomotion, strength, transferring, bathing, dressing, toileting and psychosocial support 5. Can the patient actively participate in an intensive therapy program of at least 3 hrs of therapy per day at least 5 days per week? Yes 6. The potential for patient to make measurable gains while on inpatient rehab is excellent 7. Anticipated functional outcomes upon discharge from inpatient rehab are supervision and min assist  with PT, supervision with OT, independent and modified independent with SLP. 8. Estimated rehab length of stay to reach the above functional goals is: 14-18 days. 9. Does the patient have adequate social supports and living environment to accommodate these discharge functional goals? Potentially 10. Anticipated D/C setting: Home 11. Anticipated post D/C treatments: HH therapy and Home excercise program 12. Overall Rehab/Functional Prognosis: good  RECOMMENDATIONS: This patient's condition is appropriate for continued rehabilitative care in the following setting: CIR Patient has agreed to participate in recommended program. Yes Note that insurance prior authorization may be required for reimbursement for recommended care.  Comment: Rehab Admissions Coordinator to follow up.  Delice Lesch, MD 10/04/2015

## 2015-10-04 NOTE — Progress Notes (Signed)
STROKE TEAM PROGRESS NOTE   SUBJECTIVE (INTERVAL HISTORY) Her daughter Karen [name] is at the bedside. Pt lives with her. One of her granddaughters stays with her and keeps her. She can go to the BR by herself with a cane.  Overall, her vision is pretty good - can see to function around the house. She feeds self. Family manages cooking, medications.    OBJECTIVE Temp:  [97.7 F (36.5 C)-98.1 F (36.7 C)] 98 F (36.7 C) (07/10 0930) [name] Rate:  [62-74] 72 (07/10 0930) Cardiac Rhythm:  [-] Normal sinus rhythm (07/10 0700) Resp:  [14-18] 18 (07/10 0930) BP: (123-225)/(46-75) 157/69 mmHg (07/10 0930) SpO2:  [97 %-100 %] 97 % (07/10 0930)  CBC:   Recent Labs Lab 10/03/15 0501 10/03/15 0518  WBC 8.2  --   NEUTROABS 4.0  --   HGB 12.5 12.6  HCT 38.7 37.0  MCV 90.0  --   PLT 235  --     Basic Metabolic Panel:   Recent Labs Lab 10/03/15 0501 10/03/15 0518  NA 137 140  K 4.2 4.3  CL 107 106  CO2 24  --   GLUCOSE 94 93  BUN 15 16  CREATININE 0.70 0.70  CALCIUM 8.8*  --     Lipid Panel:     Component Value Date/Time   CHOL 120 10/04/2015 0801   TRIG 79 10/04/2015 0801   HDL 33* 10/04/2015 0801   CHOLHDL 3.6 10/04/2015 0801   VLDL 16 10/04/2015 0801   LDLCALC 71 10/04/2015 0801   HgbA1c:  Lab Results  Component Value Date   HGBA1C 5.4 10/03/2015   Urine Drug Screen: No results found for: LABOPIA, COCAINSCRNUR, LABBENZ, AMPHETMU, THCU, LABBARB    IMAGING Ct Angio Head and Neck W Or Wo Contrast 10/03/2015   CT PERFUSION:   No large vascular territory perfusion abnormality.  CTA NECK:  Atherosclerosis without hemodynamically significant stenosis or acute vascular process. 10 mm carinal lymph node may be re- active, incompletely evaluated.  CTA HEAD:   No emergent large vessel occlusion or severe stenosis. Moderate luminal irregularity of the cerebral arteries compatible with atherosclerosis.  Ct Head Code Stroke W/o Cm 10/03/2015   1. No acute intracranial pathology  seen on CT.  2. Mild cortical volume loss and scattered small vessel ischemic microangiopathy.   MRI Brain Wo Contrast 10/03/2015  IMPRESSION: 1. Acute lacunar type infarct tracking from the posterior right corona radiata to the posterior lentiform nuclei. No associated hemorrhage or mass effect. 2. Underlying chronic small vessel disease, moderate for age.  TTE - Left ventricle: The cavity size was normal. There was moderate concentric hypertrophy. Systolic function was normal. The estimated ejection fraction was in the range of 60% to 65%. Wall motion was normal; there were no regional wall motion abnormalities. There was an increased relative contribution of atrial contraction to ventricular filling. Doppler parameters are consistent with abnormal left ventricular relaxation (grade 1 diastolic dysfunction). Doppler parameters are consistent with high ventricular filling pressure. - Aortic valve: Moderate diffuse thickening and calcification. There was mild regurgitation. - Mitral valve: Calcified annulus. There was mild regurgitation. - Left atrium: The atrium was mildly dilated. - Pulmonic valve: There was trivial regurgitation.    PHYSICAL EXAM General - Well nourished, well developed, in no apparent distress.  Ophthalmologic - Fundi not visualized due to noncooperation.  Cardiovascular - Regular rate and rhythm.  Mental Status -  Level of arousal and orientation to time, place, and person were intact. Language including expression, naming,  repetition, comprehension was assessed and found intact. Fund of Knowledge was assessed and was intact.  Cranial Nerves II - XII - II - Visual field intact OU, but decreased visual acuity to [facility] biaterally. III, IV, VI - Extraocular movements intact. V - Facial sensation intact bilaterally. VII - mild left facial mild droop. VIII - Hearing & vestibular intact bilaterally. X - Palate elevates symmetrically. XI - Chin turning & shoulder shrug  intact bilaterally. XII - Tongue protrusion intact.  Motor Strength - The patient's strength was 5/5 RUE and 4/5 RLE, but 3/5 LLE and 2/5 LUE.  Bulk was normal and fasciculations were absent.   Motor Tone - Muscle tone was assessed at the neck and appendages and was normal.  Reflexes - The patient's reflexes were 1+ in all extremities and she had no pathological reflexes.  Sensory - Light touch, temperature/pinprick were assessed and were symmetrical.    Coordination - The patient had normal movements in the right hand with no ataxia or dysmetria.  Tremor was absent.  Gait and Station - not tested due to weakness.    ASSESSMENT/PLAN Ms. Karen [name] is a 82 y.o. female with history of hypertension, colon cancer 10 years ago, previous stroke, legally blind bilaterally due to glaucoma,  presenting with left sided weakness. She did not receive IV t-PA due to late presentation.  Stroke:  right BG/CR infarct likely secondary to small vessel disease source. Pt developed left hemiplegia not long after hydralazine which bring SBP from 225 to 123. Received IVF bolus and albumin 12.5g Q6h x 4, symptoms improving.   Resultant  Left hemiparesis  MRI right BG/CR infarct  CTA H&N - b/l fetal PCAs, diminutive BA and b/l BVJs, and b/l VA origin atherosclerosis  CT perfusion scan - no large perfusion abnormality.  2D Echo - EF 60-65%. No source of embolus   LDL - 71  HgbA1c 5.4  VTE prophylaxis - Lovenox DIET DYS 2 Room service appropriate?: Yes; Fluid consistency:: Thin  aspirin 81 mg daily prior to admission, now on aspirin 300 mg suppository daily. Recommend to change to plavix for stroke prevention.   Patient counseled to be compliant with her antithrombotic medications  Ongoing aggressive stroke risk factor management  Therapy recommendations: HH OT, PT recommends CIR. Consult in place  Disposition:  Pending (lives with daughter, granddaughter helps provide care)  Pt is not a  STROKE AF trial candidate given advanced age  Hypotension and Hypertension  Systolic blood pressure running high up to 225.  Received hydralazine x 1 and SBP down to 123 - with pt neuro worsening  Received IVF bolus and albumin 12.5g Q6 x 4. SBP and symptoms improving.  Permissive hypertension (OK if < 220/120) but gradually normalize in 5-7 days  Long-term BP goal normotensive  Hyperlipidemia  Home meds: No lipid lowering medications prior to admission  LDL 71, goal < 70  Given LDL just barely above goal in this 82yo female, will recommend monitor lipid levels in 6 months and not start statin at this time  Other Stroke Risk Factors  Advanced age  Hx stroke/TIA  Other Active Problems  Legally blind  B/l glaucoma  Colon cancer - cured 10 years ago  Await CIR consult. Nothing further to add from stroke standpoint. Stroke team will sign off. Follow up with [facility] in 2 months. Order written.  Hospital day # 1  Radene Journey [facility] [place] for Pager information 10/04/2015 2:25 PM  I have personally  examined this patient, reviewed notes, independently viewed imaging studies, participated in medical decision making and plan of care. I have made any additions or clarifications directly to the above note. Agree with note above. She presented with left hemiplegia secondary to the right basal ganglia infarct due to small vessel disease and remains at risk for neurological worsening, recurrent stroke, TIA and needs ongoing stroke evaluation and aggressive risk factor modification. Recommend change aspirin to Plavix for stroke prevention. Greater than 50% time during this 25 minute  visit was spent on counseling and coordination of care about stroke risk and prevention  Antony Contras, MD Medical Director [place] Pager: [phone] 10/04/2015 2:30 PM     To contact Stroke Continuity provider, please refer to [email or web address] After hours, contact  General Neurology

## 2015-10-04 NOTE — Evaluation (Signed)
Speech Language Pathology Evaluation Patient Details Name: Karen [name] MRN: [id number] DOB: 08/14/1922 Today's Date: 10/04/2015 Time: [id number] SLP Time Calculation (min) (ACUTE ONLY): 25 min  Problem List:  Patient Active Problem List   Diagnosis Date Noted  . Stroke ([place]) 10/03/2015  . CVA (cerebral infarction) 10/03/2015  . Hypotension due to drugs   . HLD (hyperlipidemia)   . Legally blind   . Notalgia   . Fall 04/02/2015  . Depression 01/26/2015  . Essential hypertension   . Aortic valve disorder 11/26/2009  . NEOPLASM, MALIGNANT, COLON, CECUM 10/30/2008  . ANEMIA 10/21/2008  . PERSONAL HX COLONIC POLYPS 10/21/2008  . GALLSTONES 10/09/2008  . Backache 06/27/2007  . OCCLUSION, CENTRAL RETINAL ARTERY 08/14/2006  . Glaucoma 08/14/2006  . Diverticulosis of colon (without mention of hemorrhage) 08/14/2006  . SYNCOPE, HX OF 08/14/2006   Past Medical History:  Past Medical History  Diagnosis Date  . ANEMIA 10/21/2008  . AORTIC INSUFFICIENCY 11/26/2009  . BACK PAIN 06/27/2007  . COLON CANCER, PERSONAL HX 02/25/2010  . Diverticulosis of colon (without mention of hemorrhage) 08/14/2006  . GALLSTONES 10/09/2008  . GLAUCOMA NOS 08/14/2006  . HYPERTENSION 08/14/2006  . HYPOKALEMIA, HX OF 11/26/2009  . MELENA, HX OF 10/12/2008  . NEOPLASM, MALIGNANT, COLON, CECUM 10/30/2008  . OCCLUSION, CENTRAL RETINAL ARTERY 08/14/2006  . PERSONAL HX COLONIC POLYPS 10/21/2008  . SYNCOPE, HX OF 08/14/2006  . URI 09/07/2008  . H/O: stroke     behind right eye   Past Surgical History:  Past Surgical History  Procedure Laterality Date  . Appendectomy    . Cataract extraction      bilateral  . Dilation and curettage of uterus    . Tonsillectomy    . Colon surgery      11/20/2008,gallbladder removed  . Ankle fracture surgery  2011    right   HPI:  Karen [name] is a 82 y.o. female with a Past Medical History of HTN, glaucoma. She was normal last night, playing cards with family and then woke up in  the middle of the night with the inability to move left arm and left leg weakness. Family brought her to the ER. She at baseline is blind and walks with a cane, lives with family.She also reports "choking" on some food last week, and so she immediately failed the RN stroke swallow.    Assessment / Plan / Recommendation Clinical Impression  Pt seen for evaluation of com/cog status. Pt presents at or near baseline, with only mild dysarthria due to left orofacial weakness. Speech is fully intelligible, but "more slurred than usual" per pt report. Pt/daughter were provided with written exercises to facilitate left orofacial strength and symmetry, and were encouraged to use slow rate and over-articulation especially when tired. ST will follow briefly for education.    SLP Assessment  Post-DC recommendations pending progress in acute    Follow Up Recommendations   TBD   Frequency and Duration min 1 x/week      SLP Evaluation Prior Functioning  Cognitive/Linguistic Baseline: Within functional limits Type of Home: House  Lives With: Family Available Help at Discharge: Family;Available 24 hours/day Vocation: Unemployed   Cognition  Overall Cognitive Status: Within Functional Limits for tasks assessed Arousal/Alertness: Awake/alert Orientation Level: Oriented X4 Attention: Focused Focused Attention: Appears intact Memory: Appears intact Awareness: Appears intact    Comprehension  Auditory Comprehension Overall Auditory Comprehension: Appears within functional limits for tasks assessed Reading Comprehension Reading Status: Unable to assess (comment) (due to  visual impairment)    Expression Expression Primary Mode of Expression: Verbal Verbal Expression Overall Verbal Expression: Appears within functional limits for tasks assessed Written Expression Dominant Hand: Left Written Expression: Not tested   Oral / Motor  Oral Motor/Sensory Function Overall Oral Motor/Sensory Function:  Mild impairment Facial ROM: Reduced left;Suspected CN VII (facial) dysfunction Facial Symmetry: Abnormal symmetry left;Suspected CN VII (facial) dysfunction Facial Strength: Reduced left;Suspected CN VII (facial) dysfunction Facial Sensation: Within Functional Limits Lingual ROM: Reduced left;Suspected CN XII (hypoglossal) dysfunction Lingual Symmetry: Abnormal symmetry left;Suspected CN XII (hypoglossal) dysfunction Lingual Strength: Suspected CN XII (hypoglossal) dysfunction Lingual Sensation: Within Functional Limits Velum: Within Functional Limits Mandible: Within Functional Limits Motor Speech Overall Motor Speech: Impaired Respiration: Within functional limits Phonation: Normal Resonance: Within functional limits Articulation: Impaired Level of Impairment: Conversation Intelligibility: Intelligible Motor Planning: Within functional limits Motor Speech Errors: Not applicable Interfering Components: Hearing loss Effective Techniques: Slow rate;Over-articulate               Shonna Chock 10/04/2015, 10:20 AM  Enriqueta Shutter. [address] [place], [place] [phone]

## 2015-10-04 NOTE — Progress Notes (Signed)
Rehab Admissions Coordinator Note:  Patient was screened by Cleatrice Burke for appropriateness for an Inpatient Acute Rehab Consult per PT and OT recommendation.  At this time, we are recommending Inpatient Rehab consult.  Cleatrice Burke 10/04/2015, 1:37 PM  I can be reached at [phone].

## 2015-10-04 NOTE — Evaluation (Signed)
Occupational Therapy Evaluation Patient Details Name: Karen [name] MRN: [id number] DOB: 06-Dec-1922 Today's Date: 10/04/2015    History of Present Illness 82 y.o. female admitted for LUE weakness and difficulty walking. MRI (+) for Acute lacunar type infarct tracking from the posterior right corona radiata to the posterior lentiform nuclei. PMH significant for HTN, anemia, aortic insufficiency, colon cancer, hypokalemia, syncope, CVA (behind R eye), and back pain.   Clinical Impression   PT admitted with CVA ( see above). Pt currently with functional limitiations due to the deficits listed below (see OT problem list). PTA was living with granddaughter and has family (A) upon d/c. Pts daughter works with travel but can provided 24/7 when not on a travel assignment.  Pt will benefit from skilled OT to increase their independence and safety with adls and balance to allow discharge CIR.     Follow Up Recommendations  Supervision/Assistance - 24 hour;CIR    Equipment Recommendations  3 in 1 bedside comode    Recommendations for Other Services       Precautions / Restrictions Precautions Precautions: Fall Restrictions Weight Bearing Restrictions: No      Mobility Bed Mobility Overal bed mobility: Needs Assistance Bed Mobility: Supine to Sit     Supine to sit: Min guard     General bed mobility comments: struggle to get up to R side of the bed  Transfers Overall transfer level: Independent Equipment used: None Transfers: Sit to/from [employer] to Stand: Min assist Stand pivot transfers: Mod assist;+2 physical assistance       General transfer comment: be able to flex knees and knee extension, pt able to shift wait. pt attempting to dance once in standing    Balance     Sitting balance-Leahy Scale: Good       Standing balance-Leahy Scale: Poor                              ADL Overall ADL's : Needs  assistance/impaired Eating/Feeding: Minimal assistance;Sitting Eating/Feeding Details (indicate cue type and reason): difficulty locating utensil due to baseline visual deficits                     Toilet Transfer: Minimal assistance Toilet Transfer Details (indicate cue type and reason): simulate sit<>Stand eob to chair           General ADL Comments: Pt demonstrates posterior lean and lack of awareness to self in space     Vision Vision Assessment?: Vision impaired- to be further tested in functional context   Perception     Praxis      Pertinent Vitals/Pain Pain Assessment: No/denies pain     Hand Dominance Right   Extremity/Trunk Assessment Upper Extremity Assessment Upper Extremity Assessment: LUE deficits/detail LUE Deficits / Details: shoulder flexion 3- out 5, shoulder extension 3+ out 5, elbow flexion 2 out 5 elbow extension 3 out 5, able to complete pronation with weak supination, no grasp present  LUE Sensation: decreased light touch LUE Coordination: decreased fine motor;decreased gross motor   Lower Extremity Assessment Lower Extremity Assessment: Defer to PT evaluation RLE Deficits / Details: WFL LLE Deficits / Details: weakness in hip flexors, hams and df at 4/5   Cervical / Trunk Assessment Cervical / Trunk Assessment: Kyphotic   Communication Communication Communication: No difficulties   Cognition Arousal/Alertness: Awake/alert;Lethargic Behavior During Therapy: WFL for tasks assessed/performed Overall Cognitive Status: Impaired/Different from baseline Area of  Impairment: Memory;Safety/judgement;Problem solving     Memory: Decreased short-term memory   Safety/Judgement: Decreased awareness of safety;Decreased awareness of deficits   Problem Solving: Slow processing;Difficulty sequencing;Requires verbal cues     General Comments       Exercises       Shoulder Instructions      Home Living Family/patient expects to be  discharged to:: Private residence Living Arrangements: Spouse/significant other Available Help at Discharge: Family;Available 24 hours/day Type of Home: House Home Access: Stairs to enter [employer] of Steps: 1 Entrance Stairs-Rails: None Home Layout: One level     Bathroom Shower/Tub: Teacher, early years/pre: Standard     Home Equipment: Shower seat;Cane - quad Adaptive Equipment: Reacher Additional Comments: Pt lives with granddaughter. Granddaughter has 14 mo twins and has moved in with patients daughter pending closing on a new ranch style house.   Lives With: Family    Prior Functioning/Environment Level of Independence: Independent with assistive device(s)        Comments: Pt reports she furniture walks in house and uses SPC or RW for community mobility. Pt goes to the gym x4/week and is independent with all ADLs and IADLs.    OT Diagnosis: Generalized weakness;Cognitive deficits   OT Problem List: Decreased strength;Decreased activity tolerance;Impaired balance (sitting and/or standing);Decreased safety awareness;Decreased knowledge of use of DME or AE;Decreased knowledge of precautions;Impaired UE functional use;Decreased cognition;Decreased range of motion   OT Treatment/Interventions: Self-care/ADL training;Therapeutic exercise;DME and/or AE instruction;Therapeutic activities;Patient/family education;Balance training    OT Goals(Current goals can be found in the care plan section) Acute Rehab OT Goals Patient Stated Goal: none stated OT Goal Formulation: With patient/family Time For Goal Achievement: 10/18/15 Potential to Achieve Goals: Good  OT Frequency: Min 2X/week   Barriers to D/C:            Co-evaluation              End of Session Equipment Utilized During Treatment: Gait belt Nurse Communication: Mobility status;Precautions  Activity Tolerance: Patient tolerated treatment well Patient left: in chair;with call  bell/phone within reach;with chair alarm set;with family/visitor present   Time: [id number] OT Time Calculation (min): 38 min Charges:  OT General Charges $OT Visit: 1 Procedure OT Evaluation $OT Eval Moderate Complexity: 1 Procedure G-Codes:    Parke Poisson B 2015-10-16, 1:30 PM    Jeri Modena   OTR/L [phone] Office: [phone] .

## 2015-10-05 ENCOUNTER — Inpatient Hospital Stay (HOSPITAL_COMMUNITY)
Admission: RE | Admit: 2015-10-05 | Discharge: 2015-10-21 | DRG: 057 | Disposition: A | Discharge status: Home Health | Payer: Medicare Other | Source: Intra-hospital | Attending: Physical Medicine & Rehabilitation | Admitting: Physical Medicine & Rehabilitation

## 2015-10-05 DIAGNOSIS — H5441 Blindness, right eye, normal vision left eye: Secondary | ICD-10-CM | POA: Diagnosis present

## 2015-10-05 DIAGNOSIS — Z9842 Cataract extraction status, left eye: Secondary | ICD-10-CM

## 2015-10-05 DIAGNOSIS — Z7982 Long term (current) use of aspirin: Secondary | ICD-10-CM

## 2015-10-05 DIAGNOSIS — E785 Hyperlipidemia, unspecified: Secondary | ICD-10-CM

## 2015-10-05 DIAGNOSIS — Z9841 Cataract extraction status, right eye: Secondary | ICD-10-CM

## 2015-10-05 DIAGNOSIS — H9190 Unspecified hearing loss, unspecified ear: Secondary | ICD-10-CM | POA: Diagnosis present

## 2015-10-05 DIAGNOSIS — Z888 Allergy status to other drugs, medicaments and biological substances status: Secondary | ICD-10-CM

## 2015-10-05 DIAGNOSIS — I639 Cerebral infarction, unspecified: Secondary | ICD-10-CM | POA: Diagnosis present

## 2015-10-05 DIAGNOSIS — Z882 Allergy status to sulfonamides status: Secondary | ICD-10-CM | POA: Diagnosis not present

## 2015-10-05 DIAGNOSIS — I69391 Dysphagia following cerebral infarction: Secondary | ICD-10-CM

## 2015-10-05 DIAGNOSIS — I10 Essential (primary) hypertension: Secondary | ICD-10-CM | POA: Diagnosis present

## 2015-10-05 DIAGNOSIS — Z880 Allergy status to penicillin: Secondary | ICD-10-CM

## 2015-10-05 DIAGNOSIS — R471 Dysarthria and anarthria: Secondary | ICD-10-CM | POA: Diagnosis present

## 2015-10-05 DIAGNOSIS — C180 Malignant neoplasm of cecum: Secondary | ICD-10-CM

## 2015-10-05 DIAGNOSIS — Z85048 Personal history of other malignant neoplasm of rectum, rectosigmoid junction, and anus: Secondary | ICD-10-CM | POA: Diagnosis not present

## 2015-10-05 DIAGNOSIS — K5900 Constipation, unspecified: Secondary | ICD-10-CM | POA: Diagnosis present

## 2015-10-05 DIAGNOSIS — H9193 Unspecified hearing loss, bilateral: Secondary | ICD-10-CM

## 2015-10-05 DIAGNOSIS — Z79899 Other long term (current) drug therapy: Secondary | ICD-10-CM | POA: Diagnosis not present

## 2015-10-05 DIAGNOSIS — F329 Major depressive disorder, single episode, unspecified: Secondary | ICD-10-CM | POA: Diagnosis present

## 2015-10-05 DIAGNOSIS — G8194 Hemiplegia, unspecified affecting left nondominant side: Secondary | ICD-10-CM

## 2015-10-05 DIAGNOSIS — I6350 Cerebral infarction due to unspecified occlusion or stenosis of unspecified cerebral artery: Secondary | ICD-10-CM

## 2015-10-05 DIAGNOSIS — I6329 Cerebral infarction due to unspecified occlusion or stenosis of other precerebral arteries: Secondary | ICD-10-CM | POA: Diagnosis not present

## 2015-10-05 DIAGNOSIS — I69354 Hemiplegia and hemiparesis following cerebral infarction affecting left non-dominant side: Principal | ICD-10-CM

## 2015-10-05 LAB — CBC
HEMATOCRIT: 38.9 % (ref 36.0–46.0)
HEMOGLOBIN: 12.7 g/dL (ref 12.0–15.0)
MCH: 29.1 pg (ref 26.0–34.0)
MCHC: 32.6 g/dL (ref 30.0–36.0)
MCV: 89 fL (ref 78.0–100.0)
Platelets: 220 10*3/uL (ref 150–400)
RBC: 4.37 MIL/uL (ref 3.87–5.11)
RDW: 14.4 % (ref 11.5–15.5)
WBC: 8.2 10*3/uL (ref 4.0–10.5)

## 2015-10-05 LAB — CREATININE, SERUM
Creatinine, Ser: 0.88 mg/dL (ref 0.44–1.00)
GFR, EST NON AFRICAN AMERICAN: 56 mL/min — AB (ref >60)

## 2015-10-05 MED ORDER — BRIMONIDINE TARTRATE 0.15 % OP SOLN
1.0000 [drp] | Freq: Three times a day (TID) | OPHTHALMIC | Status: DC
  Administered 2015-10-05 – 2015-10-21 (×45): 1 [drp] via OPHTHALMIC
  Filled 2015-10-05: qty 5

## 2015-10-05 MED ORDER — PRAVASTATIN SODIUM 20 MG PO TABS
10.0000 mg | ORAL_TABLET | Freq: Every day | ORAL | Status: DC
  Administered 2015-10-05 – 2015-10-20 (×14): 10 mg via ORAL
  Filled 2015-10-05 (×15): qty 1

## 2015-10-05 MED ORDER — ACETAMINOPHEN 325 MG PO TABS
650.0000 mg | ORAL_TABLET | Freq: Four times a day (QID) | ORAL | Status: DC | PRN
Start: 2015-10-05 — End: 2015-10-21
  Administered 2015-10-05 – 2015-10-20 (×11): 650 mg via ORAL
  Filled 2015-10-05 (×14): qty 2

## 2015-10-05 MED ORDER — SENNOSIDES-DOCUSATE SODIUM 8.6-50 MG PO TABS: 1.0000 | ORAL_TABLET | Freq: Every evening | ORAL | Status: DC | PRN

## 2015-10-05 MED ORDER — ONDANSETRON HCL 4 MG/2ML IJ SOLN: 4.0000 mg | Freq: Four times a day (QID) | INTRAMUSCULAR | Status: DC | PRN

## 2015-10-05 MED ORDER — CLOPIDOGREL BISULFATE 75 MG PO TABS
75.0000 mg | ORAL_TABLET | Freq: Every day | ORAL | Status: DC
  Administered 2015-10-06 – 2015-10-21 (×16): 75 mg via ORAL
  Filled 2015-10-05 (×16): qty 1

## 2015-10-05 MED ORDER — ESCITALOPRAM OXALATE 10 MG PO TABS
10.0000 mg | ORAL_TABLET | Freq: Every day | ORAL | Status: DC
  Administered 2015-10-05 – 2015-10-21 (×17): 10 mg via ORAL
  Filled 2015-10-05 (×17): qty 1

## 2015-10-05 MED ORDER — ENOXAPARIN SODIUM 40 MG/0.4ML ~~LOC~~ SOLN
40.0000 mg | SUBCUTANEOUS | Status: DC
  Administered 2015-10-06 – 2015-10-20 (×15): 40 mg via SUBCUTANEOUS
  Filled 2015-10-05 (×15): qty 0.4

## 2015-10-05 MED ORDER — SORBITOL 70 % SOLN: 30.0000 mL | Freq: Every day | Status: DC | PRN

## 2015-10-05 MED ORDER — ENOXAPARIN SODIUM 40 MG/0.4ML ~~LOC~~ SOLN: 40.0000 mg | SUBCUTANEOUS | Status: DC

## 2015-10-05 MED ORDER — ONDANSETRON HCL 4 MG PO TABS: 4.0000 mg | ORAL_TABLET | Freq: Four times a day (QID) | ORAL | Status: DC | PRN

## 2015-10-05 MED ORDER — LATANOPROST 0.005 % OP SOLN
1.0000 [drp] | Freq: Every day | OPHTHALMIC | Status: DC
  Administered 2015-10-05 – 2015-10-20 (×16): 1 [drp] via OPHTHALMIC
  Filled 2015-10-05: qty 2.5

## 2015-10-05 NOTE — Interval H&P Note (Signed)
Karen [name] was admitted today to Inpatient Rehabilitation with the diagnosis of right basal ganglia corona radiata infarct.  The patient's history has been reviewed, patient examined, and there is no change in status.  Patient continues to be appropriate for intensive inpatient rehabilitation.  I have reviewed the patient's chart and labs.  Questions were answered to the patient's satisfaction. The PAPE has been reviewed and assessment remains appropriate.  Karen [name] Lorie Phenix 10/05/2015, 4:57 PM

## 2015-10-05 NOTE — Progress Notes (Signed)
Retta Diones, RN Rehab Admission Coordinator Signed Physical Medicine and Rehabilitation PMR Pre-admission 10/05/2015 11:26 AM  Related encounter: ED to Hosp-Admission (Current) from 10/03/2015 in [place] [facility]apse [facility]   PMR Admission Coordinator Pre-Admission Assessment  Patient: Karen [name] is an 82 y.o., female MRN: [id number] DOB: Oct 09, 1922 Height: 5\' 6"  (167.6 cm) Weight: 59.3 kg (130 lb 11.7 oz)  Insurance Information HMO: No PPO: PCP: IPA: 80/20: OTHER:  PRIMARY: Medicare A/B Policy#: [id number] Subscriber: Tally Joe CM Name: Phone#: Fax#:  Pre-Cert#: Employer: Retired Benefits: Phone #: Name: Checked in [place]. Date: 09/24/89 Deduct: $1316 Out of Pocket Max: none Life Max: unlimited CIR: 100% SNF: 100 days Outpatient: 80% Co-Pay: 20% Home Health: 100% Co-Pay: none DME: 80% Co-Pay: 20% Providers: patient's choice  Emergency Contact Information Contact Information    Name Relation Home Work Mobile   [place] Daughter [phone]  [phone]   Poteat,Brittany Felicity Pellegrini   [phone]   Lynch,Linda Daughter [phone]  [phone]     Current Medical History  Patient Admitting Diagnosis: R BG/CR infarct  History of Present Illness: A 82 y.o. right handed female with history of colorectal cancer diagnosed 2010 status post resection followed by Dr. 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 Patient lives with granddaughter and 71-month-old twins. Used an occasional cane prior to admission. Granddaughter works out of the house. Presented 10/03/2015 left sided weakness and slurred speech. Cranial CT scan negative. MRI  of the brain showed acute lacunar type infarct tracking from the posterior right corona radiata to the posterior lentiform nuclei. CT angiogram of head and neck no acute findings. Echocardiogram with ejection fraction 123456 grade 1 diastolic dysfunction. Patient did not receive TPA. Neurology consulted presently on Plavix for CVA prophylaxis. Subcutaneous Lovenox for DVT prophylaxis. Dysphagia #2 thin liquid diet. Physical and occupational therapy evaluations completed 10/04/2015 with recommendations of physical medicine rehabilitation consult. Patient to be admitted for a comprehensive inpatient rehabilitation program.   Total: 8=NIH  Past Medical History  Past Medical History  Diagnosis Date  . ANEMIA 10/21/2008  . AORTIC INSUFFICIENCY 11/26/2009  . BACK PAIN 06/27/2007  . COLON CANCER, PERSONAL HX 02/25/2010  . Diverticulosis of colon (without mention of hemorrhage) 08/14/2006  . GALLSTONES 10/09/2008  . GLAUCOMA NOS 08/14/2006  . HYPERTENSION 08/14/2006  . HYPOKALEMIA, HX OF 11/26/2009  . MELENA, HX OF 10/12/2008  . NEOPLASM, MALIGNANT, COLON, CECUM 10/30/2008  . OCCLUSION, CENTRAL RETINAL ARTERY 08/14/2006  . PERSONAL HX COLONIC POLYPS 10/21/2008  . SYNCOPE, HX OF 08/14/2006  . URI 09/07/2008  . H/O: stroke     behind right eye    Family History  family history includes Breast cancer in her sister; Heart attack in her father; Lung cancer in her brother. There is no history of Stroke or Heart disease.  Prior Rehab/Hospitalizations: Had HHPT about 6 months ago when she slipped on some stairs and hurt her back.  Has the patient had major surgery during 100 days prior to admission? No  Current Medications   Current facility-administered medications:  . acetaminophen (TYLENOL) tablet 650 mg, 650 mg, Oral, Q6H PRN, Hewitt Shorts Harduk, PA-C, 650 mg at 10/04/15 0434 . brimonidine (ALPHAGAN) 0.15 % ophthalmic solution 1 drop, 1 drop, Both Eyes, Q8H,  Lezlie Octave Black, NP, 1 drop at 10/05/15 0630 . clopidogrel (PLAVIX) tablet 75 mg, 75 mg, Oral, Daily, Rosalin Hawking, MD, 75 mg at 10/05/15 1037 . diphenhydrAMINE (BENADRYL) 12.5 MG/5ML elixir  12.5 mg, 12.5 mg, Oral, QHS PRN, Gardiner Barefoot, NP, 12.5 mg at 10/04/15 2104 . enoxaparin (LOVENOX) injection 40 mg, 40 mg, Subcutaneous, Q24H, Lezlie Octave Black, NP, 40 mg at 10/05/15 0631 . hydrALAZINE (APRESOLINE) injection 5 mg, 5 mg, Intravenous, Q4H PRN, Rosalin Hawking, MD . latanoprost (XALATAN) 0.005 % ophthalmic solution 1 drop, 1 drop, Right Eye, QHS, Lezlie Octave Black, NP, 1 drop at 10/04/15 2104 . pravastatin (PRAVACHOL) tablet 10 mg, 10 mg, Oral, q1800, Orson Eva, MD, 10 mg at 10/04/15 1802 . senna-docusate (Senokot-S) tablet 1 tablet, 1 tablet, Oral, QHS PRN, Radene Gunning, NP  Patients Current Diet: DIET DYS 2 Room service appropriate?: Yes; Fluid consistency:: Thin  Precautions / Restrictions Precautions Precautions: Fall Restrictions Weight Bearing Restrictions: No   Has the patient had 2 or more falls or a fall with injury in the past year?Yes. At least 3 falls with hurt back and bruising resulting from falls.  Prior Activity Level Limited Community (1-2x/wk): Goes out about 2 X a week.  Home Assistive Devices / Equipment Home Assistive Devices/Equipment: Environmental consultant (specify type) Home Equipment: Shower seat, Cane - quad  Prior Device Use: Indicate devices/aids used by the patient prior to current illness, exacerbation or injury? Quad cane. Used wheel chair if traveling long distances out of the home.  Prior Functional Level Prior Function Level of Independence: Independent with assistive device(s) Comments: Pt reports she furniture walks in house and uses SPC or RW for community mobility. Pt goes to the gym x4/week and is independent with all ADLs and IADLs.  Self Care: Did the patient need help bathing, dressing, using the toilet or eating? Independent  Indoor Mobility: Did  the patient need assistance with walking from room to room (with or without device)? Independent  Stairs: Did the patient need assistance with internal or external stairs (with or without device)? Independent  Functional Cognition: Did the patient need help planning regular tasks such as shopping or remembering to take medications? Needed some help  Current Functional Level Cognition  Arousal/Alertness: Awake/alert Overall Cognitive Status: Impaired/Different from baseline Orientation Level: Oriented X4 Safety/Judgement: Decreased awareness of safety, Decreased awareness of deficits General Comments: Pt generally a bit forgetful and often forgets her train of thought in mid-sentence. However, pt is aware of this and attributes it to her serotonin disorder for which she is being treated. Attention: Focused Focused Attention: Appears intact Memory: Appears intact Awareness: Appears intact   Extremity Assessment (includes Sensation/Coordination)  Upper Extremity Assessment: LUE deficits/detail LUE Deficits / Details: shoulder flexion 3- out 5, shoulder extension 3+ out 5, elbow flexion 2 out 5 elbow extension 3 out 5, able to complete pronation with weak supination, no grasp present  LUE Sensation: decreased light touch LUE Coordination: decreased fine motor, decreased gross motor  Lower Extremity Assessment: Defer to PT evaluation RLE Deficits / Details: WFL LLE Deficits / Details: weakness in hip flexors, hams and df at 4/5    ADLs  Overall ADL's : Needs assistance/impaired Eating/Feeding: Minimal assistance, Sitting Eating/Feeding Details (indicate cue type and reason): difficulty locating utensil due to baseline visual deficits Grooming: Wash/dry hands, Oral care, Minimal assistance, Standing Grooming Details (indicate cue type and reason): assist for LOB Upper Body Bathing: Set up, Sitting Lower Body Bathing: Minimal assistance, Sit to/from stand Upper Body Dressing :  Set up, Sitting Lower Body Dressing: Minimal assistance, Sit to/from stand Toilet Transfer: Minimal assistance Toilet Transfer Details (indicate cue type and reason): simulate sit<>Stand eob to chair Toileting- Clothing Manipulation  and Hygiene: Min guard, Sit to/from stand Functional mobility during ADLs: Minimal assistance, Cueing for safety General ADL Comments: Pt demonstrates posterior lean and lack of awareness to self in space    Mobility  Overal bed mobility: Needs Assistance Bed Mobility: Supine to Sit Supine to sit: Min guard General bed mobility comments: struggle to get up to R side of the bed    Transfers  Overall transfer level: Independent Equipment used: None Transfers: Sit to/from Stand, [employer] Sit to Stand: Min assist Stand pivot transfers: Mod assist, +2 physical assistance General transfer comment: be able to flex knees and knee extension, pt able to shift wait. pt attempting to dance once in standing    Ambulation / Gait / Stairs / Wheelchair Mobility  Ambulation/Gait General Gait Details: not able today    Posture / Balance Balance Overall balance assessment: Needs assistance Sitting-balance support: No upper extremity supported, Feet supported Sitting balance-Leahy Scale: Good Standing balance support: No upper extremity supported, During functional activity Standing balance-Leahy Scale: Poor Standing balance comment: multiple LOB that required assistance to regain balance    Special needs/care consideration BiPAP/CPAP No CPM No Continuous Drip IV No Dialysis No  Life Vest No Oxygen No Special Bed No Trach Size No Wound Vac (area) No  Skin No  Bowel mgmt: Last BM 10/03/15, loose, with incontinence Bladder mgmt: Incontinence at times, using bedpan Diabetic mgmt No    Previous Home Environment Living Arrangements: Spouse/significant other Lives With: Family Available Help at  Discharge: Family, Available 24 hours/day Type of Home: House Home Layout: One level Home Access: Stairs to enter Entrance Stairs-Rails: None Entrance Stairs-Number of Steps: 1 Bathroom Shower/Tub: Chiropodist: Standard Home Care Services: No Additional Comments: Pt lives with granddaughter. Granddaughter has 14 mo twins and has moved in with patients daughter pending closing on a new ranch style house.   Discharge Living Setting Plans for Discharge Living Setting: House, Lives with (comment) (May go home to daughters home.) Type of Home at Discharge: House Discharge Home Layout: One level Discharge Home Access: Stairs to enter Entrance Stairs-Number of Steps: 3 at front and 4 at garage entry Does the patient have any problems obtaining your medications?: No  Social/Family/Support Systems Patient Roles: Parent, Other (Comment) (Has 2 daughters and a granddaughter.) Contact Information: Reola Calkins - daughter Anticipated Caregiver: Meyer Cory - granddaughter Anticipated Caregiver's Contact Information: See emergency contacts Ability/Limitations of Caregiver: Daughter pays granddaughter to care for patient. Caregiver Availability: 24/7 Discharge Plan Discussed with Primary Caregiver: Yes Is Caregiver In Agreement with Plan?: Yes Does Caregiver/Family have Issues with Lodging/Transportation while Pt is in Rehab?: No  Goals/Additional Needs Patient/Family Goal for Rehab: PT supervision to min assist, OT supervision, SLP mod I and I goals Expected length of stay: 14-18 days Cultural Considerations: [employer] Dietary Needs: Dys 2, thin liquids Equipment Needs: TBD Pt/Family Agrees to Admission and willing to participate: Yes Program Orientation Provided & Reviewed with Pt/Caregiver Including Roles & Responsibilities: Yes  Decrease burden of Care through IP rehab admission: N/A  Possible need for SNF placement upon discharge: Not planned  Patient  Condition: This patient's condition remains as documented in the consult dated 10/04/15, in which the Rehabilitation Physician determined and documented that the patient's condition is appropriate for intensive rehabilitative care in an inpatient rehabilitation facility. Will admit to inpatient rehab today.  Preadmission Screen Completed By: Retta Diones, 10/05/2015 11:45 AM ______________________________________________________________________  Discussed status with Dr. Posey Pronto on 10/05/15 at 1141 and received  telephone approval for admission today.  Admission Coordinator: Retta Diones, time1141/Date07/11/17          Cosigned by: Ankit Lorie Phenix, MD at 10/05/2015 1:35 PM  Revision History     Date/Time User Provider Type Action   10/05/2015 1:35 PM Ankit Lorie Phenix, MD Physician Cosign   10/05/2015 1:35 PM Retta Diones, RN Rehab Admission Coordinator Sign   10/05/2015 11:46 AM Retta Diones, RN Rehab Admission Coordinator Sign   View Details Report

## 2015-10-05 NOTE — Progress Notes (Signed)
Pt taken down to CIR. Sending down eye drops with tech and chart paperwork.

## 2015-10-05 NOTE — PMR Pre-admission (Signed)
PMR Admission Coordinator Pre-Admission Assessment  Patient: Karen [name] is an 82 y.o., female MRN: [id number] DOB: 1922-05-12 Height: 5\' 6"  (167.6 cm) Weight: 59.3 kg (130 lb 11.7 oz)              Insurance Information HMO: No   PPO:       PCP:       IPA:       80/20:       OTHER:   PRIMARY: Medicare A/B      Policy#: [id number]      Subscriber: Tally Joe CM Name:        Phone#:       Fax#:   Pre-Cert#:        Employer: Retired Benefits:  Phone #:       Name: Checked in [place]. Date: 09/24/89     Deduct: $1316      Out of Pocket Max: none      Life Max: unlimited CIR: 100%      SNF: 100 days Outpatient: 80%     Co-Pay: 20% Home Health: 100%      Co-Pay: none DME: 80%     Co-Pay: 20% Providers: patient's choice  Emergency Contact Information Contact Information    Name Relation Home Work Mobile   [place] Daughter [phone]  [phone]   Poteat,Brittany Felicity Pellegrini   [phone]   Lynch,Linda Daughter [phone]  [phone]     Current Medical History  Patient Admitting Diagnosis:  R BG/CR infarct  History of Present Illness: A 82 y.o. right handed female with history of colorectal cancer diagnosed 2010 status post resection followed by Dr. 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 Patient lives with granddaughter and 40-month-old twins. Used an occasional cane prior to admission. Granddaughter works out of the house. Presented 10/03/2015 left sided weakness and slurred speech. Cranial CT scan negative. MRI of the brain showed acute lacunar type infarct tracking from the posterior right corona radiata to the posterior lentiform nuclei. CT angiogram of head and neck no acute findings. Echocardiogram with ejection fraction 123456 grade 1 diastolic dysfunction. Patient did not receive TPA. Neurology consulted presently on Plavix for CVA prophylaxis. Subcutaneous Lovenox for DVT prophylaxis. Dysphagia #2 thin liquid diet. Physical and  occupational therapy evaluations completed 10/04/2015 with recommendations of physical medicine rehabilitation consult. Patient to be admitted for a comprehensive inpatient rehabilitation program.    Total: 8=NIH  Past Medical History  Past Medical History  Diagnosis Date  . ANEMIA 10/21/2008  . AORTIC INSUFFICIENCY 11/26/2009  . BACK PAIN 06/27/2007  . COLON CANCER, PERSONAL HX 02/25/2010  . Diverticulosis of colon (without mention of hemorrhage) 08/14/2006  . GALLSTONES 10/09/2008  . GLAUCOMA NOS 08/14/2006  . HYPERTENSION 08/14/2006  . HYPOKALEMIA, HX OF 11/26/2009  . MELENA, HX OF 10/12/2008  . NEOPLASM, MALIGNANT, COLON, CECUM 10/30/2008  . OCCLUSION, CENTRAL RETINAL ARTERY 08/14/2006  . PERSONAL HX COLONIC POLYPS 10/21/2008  . SYNCOPE, HX OF 08/14/2006  . URI 09/07/2008  . H/O: stroke     behind right eye    Family History  family history includes Breast cancer in her sister; Heart attack in her father; Lung cancer in her brother. There is no history of Stroke or Heart disease.  Prior Rehab/Hospitalizations: Had HHPT about 6 months ago when she slipped on some stairs and hurt her back.  Has the patient had major surgery during 100 days prior to admission? No  Current Medications   Current facility-administered medications:  .  acetaminophen (TYLENOL) tablet 650 mg, 650 mg, Oral, Q6H PRN, Hewitt Shorts Harduk, PA-C, 650 mg at 10/04/15 0434 .  brimonidine (ALPHAGAN) 0.15 % ophthalmic solution 1 drop, 1 drop, Both Eyes, Q8H, Lezlie Octave Black, NP, 1 drop at 10/05/15 0630 .  clopidogrel (PLAVIX) tablet 75 mg, 75 mg, Oral, Daily, Rosalin Hawking, MD, 75 mg at 10/05/15 1037 .  diphenhydrAMINE (BENADRYL) 12.5 MG/5ML elixir 12.5 mg, 12.5 mg, Oral, QHS PRN, Gardiner Barefoot, NP, 12.5 mg at 10/04/15 2104 .  enoxaparin (LOVENOX) injection 40 mg, 40 mg, Subcutaneous, Q24H, Lezlie Octave Black, NP, 40 mg at 10/05/15 0631 .  hydrALAZINE (APRESOLINE) injection 5 mg, 5 mg, Intravenous, Q4H PRN, Rosalin Hawking, MD .   latanoprost (XALATAN) 0.005 % ophthalmic solution 1 drop, 1 drop, Right Eye, QHS, Lezlie Octave Black, NP, 1 drop at 10/04/15 2104 .  pravastatin (PRAVACHOL) tablet 10 mg, 10 mg, Oral, q1800, Orson Eva, MD, 10 mg at 10/04/15 1802 .  senna-docusate (Senokot-S) tablet 1 tablet, 1 tablet, Oral, QHS PRN, Radene Gunning, NP  Patients Current Diet: DIET DYS 2 Room service appropriate?: Yes; Fluid consistency:: Thin  Precautions / Restrictions Precautions Precautions: Fall Restrictions Weight Bearing Restrictions: No   Has the patient had 2 or more falls or a fall with injury in the past year?Yes.  At least 3 falls with hurt back and bruising resulting from falls.  Prior Activity Level Limited Community (1-2x/wk): Goes out about 2 X a week.  Home Assistive Devices / Equipment Home Assistive Devices/Equipment: Environmental consultant (specify type) Home Equipment: Shower seat, Cane - quad  Prior Device Use: Indicate devices/aids used by the patient prior to current illness, exacerbation or injury? Quad cane.  Used wheel chair if traveling long distances out of the home.  Prior Functional Level Prior Function Level of Independence: Independent with assistive device(s) Comments: Pt reports she furniture walks in house and uses SPC or RW for community mobility. Pt goes to the gym x4/week and is independent with all ADLs and IADLs.  Self Care: Did the patient need help bathing, dressing, using the toilet or eating?  Independent  Indoor Mobility: Did the patient need assistance with walking from room to room (with or without device)? Independent  Stairs: Did the patient need assistance with internal or external stairs (with or without device)? Independent  Functional Cognition: Did the patient need help planning regular tasks such as shopping or remembering to take medications? Needed some help  Current Functional Level Cognition  Arousal/Alertness: Awake/alert Overall Cognitive Status: Impaired/Different from  baseline Orientation Level: Oriented X4 Safety/Judgement: Decreased awareness of safety, Decreased awareness of deficits General Comments: Pt generally a bit forgetful and often forgets her train of thought in mid-sentence. However, pt is aware of this and attributes it to her serotonin disorder for which she is being treated. Attention: Focused Focused Attention: Appears intact Memory: Appears intact Awareness: Appears intact    Extremity Assessment (includes Sensation/Coordination)  Upper Extremity Assessment: LUE deficits/detail LUE Deficits / Details: shoulder flexion 3- out 5, shoulder extension 3+ out 5, elbow flexion 2 out 5 elbow extension 3 out 5, able to complete pronation with weak supination, no grasp present  LUE Sensation: decreased light touch LUE Coordination: decreased fine motor, decreased gross motor  Lower Extremity Assessment: Defer to PT evaluation RLE Deficits / Details: WFL LLE Deficits / Details: weakness in hip flexors, hams and df at 4/5    ADLs  Overall ADL's : Needs assistance/impaired Eating/Feeding: Minimal assistance, Sitting Eating/Feeding Details (indicate cue  type and reason): difficulty locating utensil due to baseline visual deficits Grooming: Wash/dry hands, Oral care, Minimal assistance, Standing Grooming Details (indicate cue type and reason): assist for LOB Upper Body Bathing: Set up, Sitting Lower Body Bathing: Minimal assistance, Sit to/from stand Upper Body Dressing : Set up, Sitting Lower Body Dressing: Minimal assistance, Sit to/from stand Toilet Transfer: Minimal assistance Toilet Transfer Details (indicate cue type and reason): simulate sit<>Stand eob to chair Toileting- Clothing Manipulation and Hygiene: Min guard, Sit to/from stand Functional mobility during ADLs: Minimal assistance, Cueing for safety General ADL Comments: Pt demonstrates posterior lean and lack of awareness to self in space    Mobility  Overal bed mobility: Needs  Assistance Bed Mobility: Supine to Sit Supine to sit: Min guard General bed mobility comments: struggle to get up to R side of the bed    Transfers  Overall transfer level: Independent Equipment used: None Transfers: Sit to/from Stand, [employer] Sit to Stand: Min assist Stand pivot transfers: Mod assist, +2 physical assistance General transfer comment: be able to flex knees and knee extension, pt able to shift wait. pt attempting to dance once in standing    Ambulation / Gait / Stairs / Wheelchair Mobility  Ambulation/Gait General Gait Details: not able today    Posture / Balance Balance Overall balance assessment: Needs assistance Sitting-balance support: No upper extremity supported, Feet supported Sitting balance-Leahy Scale: Good Standing balance support: No upper extremity supported, During functional activity Standing balance-Leahy Scale: Poor Standing balance comment: multiple LOB that required assistance to regain balance    Special needs/care consideration BiPAP/CPAP No CPM No Continuous Drip IV No Dialysis No       Life Vest No Oxygen No Special Bed No Trach Size No Wound Vac (area) No     Skin No                             Bowel mgmt: Last BM 10/03/15, loose, with incontinence Bladder mgmt: Incontinence at times, using bedpan Diabetic mgmt No    Previous Home Environment Living Arrangements: Spouse/significant other  Lives With: Family Available Help at Discharge: Family, Available 24 hours/day Type of Home: House Home Layout: One level Home Access: Stairs to enter Entrance Stairs-Rails: None Entrance Stairs-Number of Steps: 1 Bathroom Shower/Tub: Chiropodist: Standard Home Care Services: No Additional Comments: Pt lives with granddaughter. Granddaughter has 14 mo twins and has moved in with patients daughter pending closing on a new ranch style house.   Discharge Living Setting Plans for Discharge Living Setting:  House, Lives with (comment) (May go home to daughters home.) Type of Home at Discharge: House Discharge Home Layout: One level Discharge Home Access: Stairs to enter Entrance Stairs-Number of Steps: 3 at front and 4 at garage entry Does the patient have any problems obtaining your medications?: No  Social/Family/Support Systems Patient Roles: Parent, Other (Comment) (Has 2 daughters and a granddaughter.) Contact Information: Reola Calkins - daughter Anticipated Caregiver: Meyer Cory - granddaughter Anticipated Caregiver's Contact Information: See emergency contacts Ability/Limitations of Caregiver: Daughter pays granddaughter to care for patient. Caregiver Availability: 24/7 Discharge Plan Discussed with Primary Caregiver: Yes Is Caregiver In Agreement with Plan?: Yes Does Caregiver/Family have Issues with Lodging/Transportation while Pt is in Rehab?: No  Goals/Additional Needs Patient/Family Goal for Rehab: PT supervision to min assist, OT supervision, SLP mod I and I goals Expected length of stay: 14-18 days Cultural Considerations: [employer] Dietary Needs: Dys  2, thin liquids Equipment Needs: TBD Pt/Family Agrees to Admission and willing to participate: Yes Program Orientation Provided & Reviewed with Pt/Caregiver Including Roles  & Responsibilities: Yes  Decrease burden of Care through IP rehab admission: N/A  Possible need for SNF placement upon discharge: Not planned  Patient Condition: This patient's condition remains as documented in the consult dated 10/04/15, in which the Rehabilitation Physician determined and documented that the patient's condition is appropriate for intensive rehabilitative care in an inpatient rehabilitation facility. Will admit to inpatient rehab today.  Preadmission Screen Completed By:  Retta Diones, 10/05/2015 11:45 AM ______________________________________________________________________   Discussed status with Dr. Posey Pronto on 10/05/15 at  1141 and received telephone approval for admission today.  Admission Coordinator:  Retta Diones, time1141/Date07/11/17

## 2015-10-05 NOTE — Progress Notes (Addendum)
Speech Language Pathology Treatment: Dysphagia;Cognitive-Linquistic  Patient Details Name: Karen [name] MRN: [id number] DOB: 1923/03/23 Today's Date: 10/05/2015 Time: [id number] SLP Time Calculation (min) (ACUTE ONLY): 26 min  Assessment / Plan / Recommendation Clinical Impression  Skilled treatment session focused on addressing dysphagia and dysarthria goals. SLP facilitated session by providing skilled observation of advanced texture trials with patient self-feeding after set-up, regular textures and thin liquids via straw.  Patient demonstrated slow mastication along with left sided lingual and buccal residue.  Moderate residue was reduced with puree bolues and thin liquid sips with Min-Mod verbal cues.  Recommend to continue advanced textreu trials with SLP at this time. SLP also facilitated session with Mod verbal cues to utilize pacing for improved breath support with breaks during sentence level of verbal expression.  Min cues for over articulation were also effective at increasing intelligibility.  Continue with current plan of care.     HPI HPI: Karen [name] is a 82 y.o. female with a Past Medical History of HTN, glaucoma. She was normal last night, playing cards with family and then woke up in the middle of the night with the inability to move left arm and left leg weakness. Family brought her to the ER. She at baseline is blind and walks with a cane, lives with family.She also reports "choking" on some food last week, and so she immediately failed the RN stroke swallow.       SLP Plan  Continue with current plan of care     Recommendations  Diet recommendations: Dysphagia 2 (fine chop);Thin liquid Liquids provided via: Cup;Straw Medication Administration: Whole meds with liquid Supervision: Patient able to self feed;Full supervision/cueing for compensatory strategies Compensations: Minimize environmental distractions;Slow rate;Small sips/bites Postural Changes and/or Swallow  Maneuvers: Seated upright 90 degrees             Follow up Recommendations: Inpatient Rehab;24 hour supervision/assistance Plan: Continue with current plan of care     GO               Carmelia Roller., CCC-SLP [id number]  [place] 10/05/2015, 10:05 AM

## 2015-10-05 NOTE — Progress Notes (Signed)
Report given to Verna, RN. 

## 2015-10-05 NOTE — H&P (View-Only) (Signed)
  Physical Medicine and Rehabilitation Admission H&P    Chief Complaint  Patient presents with  . Code Stroke  : HPI: Karen [name] is a 82 y.o. right handed female with history of Colorectal cancer diagnosed 2010 status post resection followed by Dr. 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 Patient lives with granddaughter and 14-month-old twins. Used an occasional cane prior to admission. Granddaughter works out of the house. Presented 10/03/2015 left sided weakness and slurred speech. Cranial CT scan negative. MRI of the brain showed acute lacunar type infarct tracking from the posterior right corona radiata to the posterior lentiform nuclei. CT angiogram of head and neck no acute findings. Echocardiogram with ejection fraction 65% grade 1 diastolic dysfunction. Patient did not receive TPA. Neurology consulted presently on Plavix for CVA prophylaxis. Subcutaneous Lovenox for DVT prophylaxis. Dysphagia #2 thin liquid diet. Physical and occupational therapy evaluations completed 10/04/2015 with recommendations of physical medicine rehabilitation consult.Patient was admitted for a comprehensive rehabilitation program  ROS Constitutional: Negative for fever and chills.  HENT: Positive for hearing loss.  Eyes: Positive for blurred vision.  Respiratory: Negative for cough and shortness of breath.  Cardiovascular: Negative for chest pain, palpitations and leg swelling.  Gastrointestinal: Positive for constipation. Negative for nausea and vomiting.  Genitourinary: Negative for dysuria and hematuria.  Musculoskeletal: Positive for back pain.  Skin: Negative for rash.  Neurological: Positive for dizziness, focal weakness and weakness. Negative for seizures and headaches.  Psychiatric/Behavioral: Positive for depression.  All other systems reviewed and are negative   Past Medical History  Diagnosis Date  . ANEMIA 10/21/2008  . AORTIC INSUFFICIENCY 11/26/2009  . BACK  PAIN 06/27/2007  . COLON CANCER, PERSONAL HX 02/25/2010  . Diverticulosis of colon (without mention of hemorrhage) 08/14/2006  . GALLSTONES 10/09/2008  . GLAUCOMA NOS 08/14/2006  . HYPERTENSION 08/14/2006  . HYPOKALEMIA, HX OF 11/26/2009  . MELENA, HX OF 10/12/2008  . NEOPLASM, MALIGNANT, COLON, CECUM 10/30/2008  . OCCLUSION, CENTRAL RETINAL ARTERY 08/14/2006  . PERSONAL HX COLONIC POLYPS 10/21/2008  . SYNCOPE, HX OF 08/14/2006  . URI 09/07/2008  . H/O: stroke     behind right eye   Past Surgical History  Procedure Laterality Date  . Appendectomy    . Cataract extraction      bilateral  . Dilation and curettage of uterus    . Tonsillectomy    . Colon surgery      11/20/2008,gallbladder removed  . Ankle fracture surgery  2011    right   Family History  Problem Relation Age of Onset  . Stroke Neg Hx   . Heart disease Neg Hx   . Breast cancer Sister   . Heart attack Father   . Lung cancer Brother    Social History:  reports that she has never smoked. She has never used smokeless tobacco. She reports that she does not drink alcohol or use illicit drugs. Allergies:  Allergies  Allergen Reactions  . Morphine And Related Shortness Of Breath  . Sulfa Antibiotics Itching and Swelling  . Tape Other (See Comments)    PLEASE USE COBAN WRAP/PATIENT BRUISES EASILY AND HER SKIN IS THIN  . Amoxicillin Itching and Rash  . Ciprofloxacin Itching and Rash  . Methocarbamol Itching  . Trimpex [Trimethoprim] Itching and Rash   Medications Prior to Admission  Medication Sig Dispense Refill  . aspirin 81 MG tablet Take 1 tablet (81 mg total) by mouth daily. 90 tablet 6  . atenolol (TENORMIN)   50 MG tablet Take 1 tablet (50 mg total) by mouth daily. 90 tablet 1  . bimatoprost (LUMIGAN) 0.01 % SOLN Place 1 drop into the right eye at bedtime.    . brimonidine (ALPHAGAN P) 0.1 % SOLN Place 1 drop into both eyes every 8 (eight) hours.     . Calcium Carb-Cholecalciferol (CALCIUM 600/VITAMIN D3) 600-800 MG-UNIT  TABS Take 1 tablet by mouth daily.    . diphenhydramine-acetaminophen (TYLENOL PM) 25-500 MG TABS tablet Take 1 tablet by mouth at bedtime.    . escitalopram (LEXAPRO) 10 MG tablet TAKE 1 TABLET (10 MG TOTAL) BY MOUTH DAILY. 90 tablet 1  . vitamin C (ASCORBIC ACID) 500 MG tablet Take 500 mg by mouth daily.      Home: Home Living Family/patient expects to be discharged to:: Private residence Living Arrangements: Spouse/significant other Available Help at Discharge: Family, Available 24 hours/day Type of Home: House Home Access: Stairs to enter Entrance Stairs-Number of Steps: 1 Entrance Stairs-Rails: None Home Layout: One level Bathroom Shower/Tub: Tub/shower unit Bathroom Toilet: Standard Home Equipment: Shower seat, Cane - quad Adaptive Equipment: Reacher Additional Comments: Pt lives with granddaughter. Granddaughter has 14 mo twins and has moved in with patients daughter pending closing on a new ranch style house.   Lives With: Family   Functional History: Prior Function Level of Independence: Independent with assistive device(s) Comments: Pt reports she furniture walks in house and uses SPC or RW for community mobility. Pt goes to the gym x4/week and is independent with all ADLs and IADLs.  Functional Status:  Mobility: Bed Mobility Overal bed mobility: Needs Assistance Bed Mobility: Supine to Sit Supine to sit: Min guard General bed mobility comments: struggle to get up to R side of the bed Transfers Overall transfer level: Independent Equipment used: None Transfers: Sit to/from Stand, Stand Pivot Transfers Sit to Stand: Min assist Stand pivot transfers: Mod assist, +2 physical assistance General transfer comment: be able to flex knees and knee extension, pt able to shift wait. pt attempting to dance once in standing Ambulation/Gait General Gait Details: not able today    ADL: ADL Overall ADL's : Needs assistance/impaired Eating/Feeding: Minimal assistance,  Sitting Eating/Feeding Details (indicate cue type and reason): difficulty locating utensil due to baseline visual deficits Grooming: Wash/dry hands, Oral care, Minimal assistance, Standing Grooming Details (indicate cue type and reason): assist for LOB Upper Body Bathing: Set up, Sitting Lower Body Bathing: Minimal assistance, Sit to/from stand Upper Body Dressing : Set up, Sitting Lower Body Dressing: Minimal assistance, Sit to/from stand Toilet Transfer: Minimal assistance Toilet Transfer Details (indicate cue type and reason): simulate sit<>Stand eob to chair Toileting- Clothing Manipulation and Hygiene: Min guard, Sit to/from stand Functional mobility during ADLs: Minimal assistance, Cueing for safety General ADL Comments: Pt demonstrates posterior lean and lack of awareness to self in space  Cognition: Cognition Overall Cognitive Status: Impaired/Different from baseline Arousal/Alertness: Awake/alert Orientation Level: Oriented X4 Attention: Focused Focused Attention: Appears intact Memory: Appears intact Awareness: Appears intact Cognition Arousal/Alertness: Awake/alert, Lethargic Behavior During Therapy: WFL for tasks assessed/performed Overall Cognitive Status: Impaired/Different from baseline Area of Impairment: Memory, Safety/judgement, Problem solving Memory: Decreased short-term memory Safety/Judgement: Decreased awareness of safety, Decreased awareness of deficits Problem Solving: Slow processing, Difficulty sequencing, Requires verbal cues General Comments: Pt generally a bit forgetful and often forgets her train of thought in mid-sentence. However, pt is aware of this and attributes it to her serotonin disorder for which she is being treated.  Physical Exam: Blood pressure   173/63, pulse 105, temperature 97.7 F (36.5 C), temperature source Oral, resp. rate 18, height 5' 6" (1.676 m), weight 59.3 kg (130 lb 11.7 oz), SpO2 96 %. Physical Exam Constitutional: She is  oriented to person, place, and time. She appears well-developed and well-nourished.  HENT:  Head: Normocephalic and atraumatic.  Eyes:  Decreased vision right eye Injected sclera R>L  Neck: Normal range of motion. Neck supple. No thyromegaly present.  Cardiovascular: Normal rate and regular rhythm.  Respiratory: Effort normal and breath sounds normal. No respiratory distress.  GI: Soft. Bowel sounds are normal. She exhibits no distension.  Musculoskeletal: She exhibits no edema or tenderness.  Neurological: She is alert and oriented to person, place, and time.  Speech is dysarthric but intelligible.  She makes good eye contact with examiner.  Follows simple commands.  Fair awareness of deficits. DTRs 3+ LLE Sensation intact to light touch HOH Motor: RUE/RLE: 5/5 proximal to distal LUE/LLE: 4/5 proximal to distal  Skin: Skin is warm and dry.  Psychiatric: She has a normal mood and affect. Her behavior is normal  Results for orders placed or performed during the hospital encounter of 10/03/15 (from the past 48 hour(s))  Protime-INR     Status: None   Collection Time: 10/03/15  5:01 AM  Result Value Ref Range   Prothrombin Time 13.6 11.6 - 15.2 seconds   INR 1.02 0.00 - 1.49  APTT     Status: None   Collection Time: 10/03/15  5:01 AM  Result Value Ref Range   aPTT 26 24 - 37 seconds  CBC     Status: None   Collection Time: 10/03/15  5:01 AM  Result Value Ref Range   WBC 8.2 4.0 - 10.5 K/uL   RBC 4.30 3.87 - 5.11 MIL/uL   Hemoglobin 12.5 12.0 - 15.0 g/dL   HCT 38.7 36.0 - 46.0 %   MCV 90.0 78.0 - 100.0 fL   MCH 29.1 26.0 - 34.0 pg   MCHC 32.3 30.0 - 36.0 g/dL   RDW 14.2 11.5 - 15.5 %   Platelets 235 150 - 400 K/uL  Differential     Status: None   Collection Time: 10/03/15  5:01 AM  Result Value Ref Range   Neutrophils Relative % 49 %   Neutro Abs 4.0 1.7 - 7.7 K/uL   Lymphocytes Relative 40 %   Lymphs Abs 3.2 0.7 - 4.0 K/uL   Monocytes Relative 8 %   Monocytes  Absolute 0.7 0.1 - 1.0 K/uL   Eosinophils Relative 3 %   Eosinophils Absolute 0.3 0.0 - 0.7 K/uL   Basophils Relative 0 %   Basophils Absolute 0.0 0.0 - 0.1 K/uL  Comprehensive metabolic panel     Status: Abnormal   Collection Time: 10/03/15  5:01 AM  Result Value Ref Range   Sodium 137 135 - 145 mmol/L   Potassium 4.2 3.5 - 5.1 mmol/L    Comment: SLIGHT HEMOLYSIS   Chloride 107 101 - 111 mmol/L   CO2 24 22 - 32 mmol/L   Glucose, Bld 94 65 - 99 mg/dL   BUN 15 6 - 20 mg/dL   Creatinine, Ser 0.70 0.44 - 1.00 mg/dL   Calcium 8.8 (L) 8.9 - 10.3 mg/dL   Total Protein 7.0 6.5 - 8.1 g/dL   Albumin 3.3 (L) 3.5 - 5.0 g/dL   AST 31 15 - 41 U/L   ALT 10 (L) 14 - 54 U/L   Alkaline Phosphatase 56 38 - 126  U/L   Total Bilirubin 0.7 0.3 - 1.2 mg/dL   GFR calc non Af Amer >60 >60 mL/min   GFR calc Af Amer >60 >60 mL/min    Comment: (NOTE) The eGFR has been calculated using the CKD EPI equation. This calculation has not been validated in all clinical situations. eGFR's persistently <60 mL/min signify possible Chronic Kidney Disease.    Anion gap 6 5 - 15  Hemoglobin A1c     Status: None   Collection Time: 10/03/15  5:01 AM  Result Value Ref Range   Hgb A1c MFr Bld 5.4 4.8 - 5.6 %    Comment: (NOTE)         Pre-diabetes: 5.7 - 6.4         Diabetes: >6.4         Glycemic control for adults with diabetes: <7.0    Mean Plasma Glucose 108 mg/dL    Comment: (NOTE) Performed At: [facility] [address] [place], [place] [id number] [place] William F MD Ph:[phone]   I-stat troponin, ED     Status: None   Collection Time: 10/03/15  5:16 AM  Result Value Ref Range   Troponin i, poc 0.00 0.00 - 0.08 ng/mL   Comment 3            Comment: Due to the release kinetics of cTnI, a negative result within the first hours of the onset of symptoms does not rule out myocardial infarction with certainty. If myocardial infarction is still suspected, repeat the test at appropriate  intervals.   I-Stat Chem 8, ED     Status: Abnormal   Collection Time: 10/03/15  5:18 AM  Result Value Ref Range   Sodium 140 135 - 145 mmol/L   Potassium 4.3 3.5 - 5.1 mmol/L   Chloride 106 101 - 111 mmol/L   BUN 16 6 - 20 mg/dL   Creatinine, Ser 0.70 0.44 - 1.00 mg/dL   Glucose, Bld 93 65 - 99 mg/dL   Calcium, Ion 1.10 (L) 1.12 - 1.23 mmol/L   TCO2 25 0 - 100 mmol/L   Hemoglobin 12.6 12.0 - 15.0 g/dL   HCT 37.0 36.0 - 46.0 %  CBG monitoring, ED     Status: None   Collection Time: 10/03/15  6:36 AM  Result Value Ref Range   Glucose-Capillary 85 65 - 99 mg/dL  Lipid panel     Status: Abnormal   Collection Time: 10/04/15  8:01 AM  Result Value Ref Range   Cholesterol 120 0 - 200 mg/dL   Triglycerides 79 <150 mg/dL   HDL 33 (L) >40 mg/dL   Total CHOL/HDL Ratio 3.6 RATIO   VLDL 16 0 - 40 mg/dL   LDL Cholesterol 71 0 - 99 mg/dL    Comment:        Total Cholesterol/HDL:CHD Risk Coronary Heart Disease Risk Table                     Men   Women  1/2 Average Risk   3.4   3.3  Average Risk       5.0   4.4  2 X Average Risk   9.6   7.1  3 X Average Risk  23.4   11.0        Use the calculated Patient Ratio above and the CHD Risk Table to determine the patient's CHD Risk.        ATP III CLASSIFICATION (LDL):  <100       mg/dL   Optimal  100-129  mg/dL   Near or Above                    Optimal  130-159  mg/dL   Borderline  160-189  mg/dL   High  >190     mg/dL   Very High    Ct Angio Head W Or [name] Contrast  10/03/2015  CLINICAL DATA:  Assess stroke or blockage. LEFT-sided weakness and dysarthria beginning at 0430 hours. EXAM: CT PERFUSION CT ANGIOGRAPHY OF THE HEAD AND NECK TECHNIQUE: Multidetector CT imaging of the head and neck was performed using the standard protocol during bolus administration of intravenous contrast. Multiplanar CT image reconstructions and MIPs were obtained to evaluate the vascular anatomy. Carotid stenosis measurements (when applicable) are obtained  utilizing NASCET criteria, using the distal internal carotid diameter as the denominator. CT perfusion with off line post processing. CONTRAST:  50 cc Isovue 370 COMPARISON:  CT HEAD October 03, 2015 at 0505 hours FINDINGS: CT PERFUSION No large vascular territory perfusion abnormality. CTA NECK AORTIC ARCH: Normal appearance of the thoracic arch, normal branch pattern. Moderate calcific atherosclerosis. Origin of the arch vessels incompletely imaged. RIGHT CAROTID SYSTEM: Common carotid artery is widely patent, coursing in a straight line fashion. Normal appearance of the carotid bifurcation without hemodynamically significant stenosis by NASCET criteria. Mild eccentric calcific atherosclerosis. Normal appearance of the included internal carotid artery. LEFT CAROTID SYSTEM: Common carotid artery is widely patent, coursing in a straight line fashion. Normal appearance of the carotid bifurcation without hemodynamically significant stenosis by NASCET criteria. Mild eccentric calcific atherosclerosis. Normal appearance of the included internal carotid artery. VERTEBRAL ARTERIES:Left vertebral artery is dominant. Normal appearance of the vertebral arteries, which appear widely patent. SKELETON: No acute osseous process though bone windows have not been submitted. OTHER NECK: Soft tissues of the neck are non-acute though, not tailored for evaluation. 11 mm RIGHT apical lung calcification. Less than 1 cm in LEFT thyroid nodule is below size followup recommendation. Atrophic submandibular glands. 10 mm carinal lymph node. CTA HEAD ANTERIOR CIRCULATION: Normal appearance of the cervical internal carotid arteries, petrous, cavernous and supra clinoid internal carotid arteries. Widely patent anterior communicating artery. Normal appearance of the anterior and middle cerebral arteries. No large vessel occlusion, hemodynamically significant stenosis, dissection, contrast extravasation or aneurysm. Moderate luminal irregularity of  the anterior and middle cerebral arteries. POSTERIOR CIRCULATION: Normal appearance of the vertebral arteries, vertebrobasilar junction and basilar artery, as well as main branch vessels. Normal appearance of the posterior cerebral arteries. Fetal origin bilateral posterior cerebral arteries. No large vessel occlusion, hemodynamically significant stenosis, dissection, contrast extravasation or aneurysm. Moderate luminal irregularity of the posterior cerebral arteries. VENOUS SINUSES: Major dural venous sinuses are patent though not tailored for evaluation on this angiographic examination. ANATOMIC VARIANTS: None. DELAYED PHASE: Not performed. IMPRESSION: CT PERFUSION:  No large vascular territory perfusion abnormality. CTA NECK: Atherosclerosis without hemodynamically significant stenosis or acute vascular process. 10 mm carinal lymph node may be re- active, incompletely evaluated. CTA HEAD:  No emergent large vessel occlusion or severe stenosis. Moderate luminal irregularity of the cerebral arteries compatible with atherosclerosis. Acute findings discussed with and reconfirmed by Dr.MCNEILL KIRKPATRICK on 10/03/2015 at 6:35 am. Electronically Signed   By: Courtnay  Bloomer M.D.   On: 10/03/2015 06:45   Dg Chest 2 View  10/03/2015  CLINICAL DATA:  Left-sided weakness.  Hypertension. EXAM: CHEST  2 VIEW COMPARISON:  Chest radiograph and chest CT April 02, 2015 FINDINGS:   There is a calcified granuloma in the right upper lobe. There is no edema or consolidation. Heart is mildly enlarged with pulmonary vascularity within normal limits. There is atherosclerotic calcification in the aorta. No adenopathy. There is degenerative change in each shoulder. There is also degenerative change in the thoracic spine. IMPRESSION: Mild cardiomegaly. No edema or consolidation. Calcified granuloma right upper lobe. Aortic atherosclerosis. Electronically Signed   By: Lowella Grip III M.D.   On: 10/03/2015 10:13   Ct Angio Neck W  Or [name] Contrast  10/03/2015  CLINICAL DATA:  Assess stroke or blockage. LEFT-sided weakness and dysarthria beginning at 0430 hours. EXAM: CT PERFUSION CT ANGIOGRAPHY OF THE HEAD AND NECK TECHNIQUE: Multidetector CT imaging of the head and neck was performed using the standard protocol during bolus administration of intravenous contrast. Multiplanar CT image reconstructions and MIPs were obtained to evaluate the vascular anatomy. Carotid stenosis measurements (when applicable) are obtained utilizing NASCET criteria, using the distal internal carotid diameter as the denominator. CT perfusion with off line post processing. CONTRAST:  50 cc Isovue 370 COMPARISON:  CT HEAD October 03, 2015 at 0505 hours FINDINGS: CT PERFUSION No large vascular territory perfusion abnormality. CTA NECK AORTIC ARCH: Normal appearance of the thoracic arch, normal branch pattern. Moderate calcific atherosclerosis. Origin of the arch vessels incompletely imaged. RIGHT CAROTID SYSTEM: Common carotid artery is widely patent, coursing in a straight line fashion. Normal appearance of the carotid bifurcation without hemodynamically significant stenosis by NASCET criteria. Mild eccentric calcific atherosclerosis. Normal appearance of the included internal carotid artery. LEFT CAROTID SYSTEM: Common carotid artery is widely patent, coursing in a straight line fashion. Normal appearance of the carotid bifurcation without hemodynamically significant stenosis by NASCET criteria. Mild eccentric calcific atherosclerosis. Normal appearance of the included internal carotid artery. VERTEBRAL ARTERIES:Left vertebral artery is dominant. Normal appearance of the vertebral arteries, which appear widely patent. SKELETON: No acute osseous process though bone windows have not been submitted. OTHER NECK: Soft tissues of the neck are non-acute though, not tailored for evaluation. 11 mm RIGHT apical lung calcification. Less than 1 cm in LEFT thyroid nodule is below size  followup recommendation. Atrophic submandibular glands. 10 mm carinal lymph node. CTA HEAD ANTERIOR CIRCULATION: Normal appearance of the cervical internal carotid arteries, petrous, cavernous and supra clinoid internal carotid arteries. Widely patent anterior communicating artery. Normal appearance of the anterior and middle cerebral arteries. No large vessel occlusion, hemodynamically significant stenosis, dissection, contrast extravasation or aneurysm. Moderate luminal irregularity of the anterior and middle cerebral arteries. POSTERIOR CIRCULATION: Normal appearance of the vertebral arteries, vertebrobasilar junction and basilar artery, as well as main branch vessels. Normal appearance of the posterior cerebral arteries. Fetal origin bilateral posterior cerebral arteries. No large vessel occlusion, hemodynamically significant stenosis, dissection, contrast extravasation or aneurysm. Moderate luminal irregularity of the posterior cerebral arteries. VENOUS SINUSES: Major dural venous sinuses are patent though not tailored for evaluation on this angiographic examination. ANATOMIC VARIANTS: None. DELAYED PHASE: Not performed. IMPRESSION: CT PERFUSION:  No large vascular territory perfusion abnormality. CTA NECK: Atherosclerosis without hemodynamically significant stenosis or acute vascular process. 10 mm carinal lymph node may be re- active, incompletely evaluated. CTA HEAD:  No emergent large vessel occlusion or severe stenosis. Moderate luminal irregularity of the cerebral arteries compatible with atherosclerosis. Acute findings discussed with and reconfirmed by Dr.MCNEILL [facility] on 10/03/2015 at 6:35 am. Electronically Signed   By: Elon Alas M.D.   On: 10/03/2015 06:45   Karen [name] Contrast  10/03/2015  CLINICAL DATA:  82-year-old female with acute left leg weakness in the middle of the night. Code stroke. Initial encounter. EXAM: MRI HEAD WITHOUT CONTRAST TECHNIQUE: Multiplanar, multiecho pulse  sequences of the brain and surrounding structures were obtained without intravenous contrast. COMPARISON:  Head CT, CTA, and CT perfusion without contrast 0505 hours today, and earlier. FINDINGS: 15 mm area of confluent restricted diffusion tracking from the posterior aspect of the right corona radiata toward the posterior right lentiform nuclei (series 10, image 16). Mild associated T2 and FLAIR hyperintensity. No associated hemorrhage or mass effect. No other restricted diffusion. Major intracranial vascular flow voids are preserved. Widespread increased perivascular spaces in the deep gray matter nuclei. Moderate T2 hyperintensity in the thalami compatible with chronic small vessel ischemia worse on the left. Patchy and confluent bilateral cerebral white matter T2 and FLAIR hyperintensity. No cortical encephalomalacia identified. There are occasional chronic micro hemorrhages in the brain, including at the posterior limb of the left external capsule, in the left paracentral pons, and in the left deep cerebellar nuclei. Minimal T2 heterogeneity in the pons. No midline shift, mass effect, evidence of mass lesion, ventriculomegaly, extra-axial collection or acute intracranial hemorrhage. Cervicomedullary junction and pituitary are within normal limits. Negative visualized cervical spine. Visualized bone marrow signal is within normal limits. Visible internal auditory structures appear normal. Trace mastoid fluid. Mild paranasal sinus mucosal thickening. Postoperative changes to both globes. Otherwise negative orbit and scalp soft tissues. IMPRESSION: 1. Acute lacunar type infarct tracking from the posterior right corona radiata to the posterior lentiform nuclei. No associated hemorrhage or mass effect. 2. Underlying chronic small vessel disease, moderate for age. Electronically Signed   By: H  Hall M.D.   On: 10/03/2015 12:14   Ct Cerebral Perfusion W Contrast  10/03/2015  CLINICAL DATA:  Assess stroke or  blockage. LEFT-sided weakness and dysarthria beginning at 0430 hours. EXAM: CT PERFUSION CT ANGIOGRAPHY OF THE HEAD AND NECK TECHNIQUE: Multidetector CT imaging of the head and neck was performed using the standard protocol during bolus administration of intravenous contrast. Multiplanar CT image reconstructions and MIPs were obtained to evaluate the vascular anatomy. Carotid stenosis measurements (when applicable) are obtained utilizing NASCET criteria, using the distal internal carotid diameter as the denominator. CT perfusion with off line post processing. CONTRAST:  50 cc Isovue 370 COMPARISON:  CT HEAD October 03, 2015 at 0505 hours FINDINGS: CT PERFUSION No large vascular territory perfusion abnormality. CTA NECK AORTIC ARCH: Normal appearance of the thoracic arch, normal branch pattern. Moderate calcific atherosclerosis. Origin of the arch vessels incompletely imaged. RIGHT CAROTID SYSTEM: Common carotid artery is widely patent, coursing in a straight line fashion. Normal appearance of the carotid bifurcation without hemodynamically significant stenosis by NASCET criteria. Mild eccentric calcific atherosclerosis. Normal appearance of the included internal carotid artery. LEFT CAROTID SYSTEM: Common carotid artery is widely patent, coursing in a straight line fashion. Normal appearance of the carotid bifurcation without hemodynamically significant stenosis by NASCET criteria. Mild eccentric calcific atherosclerosis. Normal appearance of the included internal carotid artery. VERTEBRAL ARTERIES:Left vertebral artery is dominant. Normal appearance of the vertebral arteries, which appear widely patent. SKELETON: No acute osseous process though bone windows have not been submitted. OTHER NECK: Soft tissues of the neck are non-acute though, not tailored for evaluation. 11 mm RIGHT apical lung calcification. Less than 1 cm in LEFT thyroid nodule is below size followup recommendation. Atrophic submandibular glands. 10 mm  carinal lymph node. CTA HEAD ANTERIOR CIRCULATION: Normal appearance of the cervical internal carotid arteries, petrous, cavernous   and supra clinoid internal carotid arteries. Widely patent anterior communicating artery. Normal appearance of the anterior and middle cerebral arteries. No large vessel occlusion, hemodynamically significant stenosis, dissection, contrast extravasation or aneurysm. Moderate luminal irregularity of the anterior and middle cerebral arteries. POSTERIOR CIRCULATION: Normal appearance of the vertebral arteries, vertebrobasilar junction and basilar artery, as well as main branch vessels. Normal appearance of the posterior cerebral arteries. Fetal origin bilateral posterior cerebral arteries. No large vessel occlusion, hemodynamically significant stenosis, dissection, contrast extravasation or aneurysm. Moderate luminal irregularity of the posterior cerebral arteries. VENOUS SINUSES: Major dural venous sinuses are patent though not tailored for evaluation on this angiographic examination. ANATOMIC VARIANTS: None. DELAYED PHASE: Not performed. IMPRESSION: CT PERFUSION:  No large vascular territory perfusion abnormality. CTA NECK: Atherosclerosis without hemodynamically significant stenosis or acute vascular process. 10 mm carinal lymph node may be re- active, incompletely evaluated. CTA HEAD:  No emergent large vessel occlusion or severe stenosis. Moderate luminal irregularity of the cerebral arteries compatible with atherosclerosis. Acute findings discussed with and reconfirmed by Dr.[facility] on 10/03/2015 at 6:35 am. Electronically Signed   By: Elon Alas M.D.   On: 10/03/2015 06:45   Ct Head Code Stroke W/o Cm  10/03/2015  CLINICAL DATA:  Acute onset of left-sided weakness. Code stroke. Initial encounter. EXAM: CT HEAD WITHOUT CONTRAST TECHNIQUE: Contiguous axial images were obtained from the base of the skull through the vertex without intravenous contrast. COMPARISON:  CT  of the head performed 11/07/2009 FINDINGS: There is no evidence of acute infarction, mass lesion, or intra- or extra-axial hemorrhage on CT. Prominence of the ventricles and sulci reflects mild cortical volume loss. Scattered periventricular and subcortical white matter change likely reflects small vessel ischemic microangiopathy. The brainstem and fourth ventricle are within normal limits. The basal ganglia are unremarkable in appearance. The cerebral hemispheres demonstrate grossly normal gray-white differentiation. No mass effect or midline shift is seen. There is no evidence of fracture; visualized osseous structures are unremarkable in appearance. Postoperative change is noted at the left optic globe. The right orbit is unremarkable in appearance. The paranasal sinuses and mastoid air cells are well-aerated. No significant soft tissue abnormalities are seen. IMPRESSION: 1. No acute intracranial pathology seen on CT. 2. Mild cortical volume loss and scattered small vessel ischemic microangiopathy. These results were called by telephone at the time of interpretation on 10/03/2015 at 5:28 am to Dr. Roland Rack, who verbally acknowledged these results. Electronically Signed   By: Garald Balding M.D.   On: 10/03/2015 05:27    Medical Problem List and Plan: 1.  Left-sided weakness with dysarthria/dysphagia secondary to right basal ganglia corona radiata infarct 2.  DVT Prophylaxis/Anticoagulation: Subcutaneous Lovenox. Monitor platelet counts or any signs of bleeding 3. Pain Management: Tylenol as needed 4. Dysphagia. Dysphagia #2 thin liquid diet. Follow-up speech therapy 5. Neuropsych: This patient is capable of making decisions on her own behalf. 6. Skin/Wound Care: Routine skin checks 7. Fluids/Electrolytes/Nutrition: Routine I&O's with follow-up chemistries 8. Hypertension. Permissive hypertension. Monitor with increased mobility. Patient on Tenormin 50 mg daily prior to admission. 9. Mood.  Resume Lexapro 10 mg daily 10. Hard of hearing. 11. Decreased vision right eye. 12. Hyperlipidemia. Pravachol 13. History of colorectal cancer diagnosed 2010. Status post resection. Presently in remission. Followed by Dr. Alen Blew   Post Admission Physician Evaluation: 1. Functional deficits secondary  to right basal ganglia corona radiata infarct. 2. Patient is admitted to receive collaborative, interdisciplinary care between the physiatrist, rehab nursing staff, and therapy team. 3.  Patient's level of medical complexity and substantial therapy needs in context of that medical necessity cannot be provided at a lesser intensity of care such as a SNF. 4. Patient has experienced substantial functional loss from his/her baseline which was documented above under the "Functional History" and "Functional Status" headings.  Judging by the patient's diagnosis, physical exam, and functional history, the patient has potential for functional progress which will result in measurable gains while on inpatient rehab.  These gains will be of substantial and practical use upon discharge  in facilitating mobility and self-care at the household level. 5. Physiatrist will provide 24 hour management of medical needs as well as oversight of the therapy plan/treatment and provide guidance as appropriate regarding the interaction of the two. 6. 24 hour rehab nursing will assist with bowel management, safety, disease management and patient education and help integrate therapy concepts, techniques,education, etc. 7. PT will assess and treat for/with: Lower extremity strength, range of motion, stamina, balance, functional mobility, safety, adaptive techniques and equipment, coping skills, pain control, stroke education.   Goals are: Supervision/Min A. 8. OT will assess and treat for/with: ADL's, functional mobility, safety, upper extremity strength, adaptive techniques and equipment, ego support, and community reintegration.    Goals are: Supervision/Min A. Therapy may proceed with showering this patient. 9. SLP will assess and treat for/with: swallowing.  Goals are: Mod I/Ind. 10. Case Management and Social Worker will assess and treat for psychological issues and discharge planning. 11. Team conference will be held weekly to assess progress toward goals and to determine barriers to discharge. 12. Patient will receive at least 3 hours of therapy per day at least 5 days per week. 13. ELOS: 14-18 days.       14. Prognosis:  good  Wirt Hemmerich, MD  10/04/2015 

## 2015-10-05 NOTE — Care Management Note (Signed)
Case Management Note  Patient Details  Name: Karen [name] MRN: [id number] Date of Birth: 26-Sep-1922  Subjective/Objective:                    Action/Plan: Pt discharging to CIR today. No further needs per CM.   Expected Discharge Date:                  Expected Discharge Plan:  [place]  In-House Referral:     Discharge planning Services  CM Consult  Post Acute Care Choice:    Choice offered to:     DME Arranged:    DME Agency:     HH Arranged:    [place] Agency:     Status of Service:  Completed, signed off  If discussed at [employer] of Stay Meetings, dates discussed:    Additional Comments:  [name], RN 10/05/2015, 12:20 PM

## 2015-10-05 NOTE — Progress Notes (Signed)
Physical Therapy Treatment Patient Details Name: NIKINA HAYER MRN: [id number] DOB: 1923-02-05 Today's Date: 10/05/2015    History of Present Illness 82 y.o. female admitted for LUE weakness and difficulty walking. MRI (+) for Acute lacunar type infarct tracking from the posterior right corona radiata to the posterior lentiform nuclei. PMH significant for HTN, anemia, aortic insufficiency, colon cancer, hypokalemia, syncope, CVA (behind R eye), and back pain.    PT Comments    Improving steadily.  Worked in standing doing pregait activity, worked on [employer] and standing safety and transitions.  Pt is ready for CIR.  Follow Up Recommendations  CIR     Equipment Recommendations  None recommended by PT    Recommendations for Other Services       Precautions / Restrictions Precautions Precautions: Fall    Mobility  Bed Mobility                  Transfers Overall transfer level: Needs assistance   Transfers: Sit to/from Stand Sit to Stand: Mod assist;Min assist         General transfer comment: Worked on assymetrical scoots to edge of chair and prep for stand.  Pt needed assist coming forward and boost to standing.  Ambulation/Gait Ambulation/Gait assistance: Mod assist Ambulation Distance (Feet): 5 Feet (Forward/back x2) Assistive device: Rolling walker (2 wheeled) Gait Pattern/deviations: Step-through pattern   Gait velocity interpretation: Below normal speed for age/gender General Gait Details: paretic gait overall.  cues to exagerate L steps forward and back to help her clear her foot and not drag the foot back.   Stairs            Wheelchair Mobility    Modified Rankin (Stroke Patients Only) Modified Rankin (Stroke Patients Only) Pre-Morbid Rankin Score: No symptoms Modified Rankin: Moderately severe disability     Balance Overall balance assessment: Needs assistance Sitting-balance support: Single extremity supported;No upper extremity  supported;Feet supported Sitting balance-Leahy Scale: Good     Standing balance support: Bilateral upper extremity supported Standing balance-Leahy Scale: Poor Standing balance comment: Worked for 5-6 mins over 2 pregait trials for balance in midline, w/shifting, advancing and stepping back with each leg                    Cognition Arousal/Alertness: Awake/alert Behavior During Therapy: WFL for tasks assessed/performed Overall Cognitive Status: Impaired/Different from baseline Area of Impairment: Memory;Safety/judgement;Problem solving     Memory: Decreased short-term memory   Safety/Judgement: Decreased awareness of safety;Decreased awareness of deficits   Problem Solving: Slow processing      Exercises General Exercises - Lower Extremity Long Arc Quad: AROM;Both;10 reps;Seated (resisted flexion and extension) Hip Flexion/Marching: AROM;Both;10 reps;Seated Toe Raises: AROM;Both;10 reps;Seated Heel Raises: AROM;Both;10 reps;Seated    General Comments        Pertinent Vitals/Pain Pain Assessment: No/denies pain    Home Living                      Prior Function            PT Goals (current goals can now be found in the care plan section) Acute Rehab PT Goals PT Goal Formulation: With patient Time For Goal Achievement: 10/18/15 Potential to Achieve Goals: Good Progress towards PT goals: Progressing toward goals    Frequency  Min 3X/week    PT Plan Current plan remains appropriate    Co-evaluation             End of Session  Activity Tolerance: Patient tolerated treatment well Patient left: in chair;with call bell/phone within reach;with family/visitor present     Time: 1430-1456 PT Time Calculation (min) (ACUTE ONLY): 26 min  Charges:  $Therapeutic Exercise: 8-22 mins $Therapeutic Activity: 8-22 mins                    G Codes:      Shylynn Bruning, Tessie Fass 10/05/2015, 3:40 PM 10/05/2015  Donnella Sham,  [place] [phone]  (pager)

## 2015-10-05 NOTE — Progress Notes (Signed)
Rehab admissions - I spoke with daughter by phone this am.  Daughter would like inpatient rehab prior to patient coming home with her and granddaughter.  Bed available and will admit to acute inpatient rehab today.  I have clearance from Dr. Carles Collet.  Call me for questions.  [phone]

## 2015-10-05 NOTE — Progress Notes (Signed)
Ankit Lorie Phenix, MD Physician Signed Physical Medicine and Rehabilitation Consult Note 10/04/2015 2:02 PM  Related encounter: ED to Hosp-Admission (Current) from 10/03/2015 in [place] Collapse All        Physical Medicine and Rehabilitation Consult Reason for Consult: Right basal ganglia corona radiata infarct Referring Physician: [facility]   HPI: Karen [name] is a 82 y.o. 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 Patient lives with granddaughter and 62-month-old twins. Used an occasional cane prior to admission. Granddaughter works out of the house. Presented 10/03/2015 left sided weakness and slurred speech. Cranial CT scan negative. MRI of the brain showed acute lacunar type infarct tracking from the posterior right corona radiata to the posterior lentiform nuclei. CT angiogram of head and neck no acute findings. Echocardiogram with ejection fraction [id number] grade 1 diastolic dysfunction. Patient did not receive TPA. Neurology consulted presently on Plavix for CVA prophylaxis. Subcutaneous Lovenox for DVT prophylaxis. Dysphagia #2 thin liquid diet. Physical and occupational therapy evaluations completed 10/04/2015 with recommendations of physical medicine rehabilitation consult.   Review of Systems  Constitutional: Negative for fever and chills.  HENT: Positive for hearing loss.  Eyes: Positive for blurred vision.  Respiratory: Negative for cough and shortness of breath.  Cardiovascular: Negative for chest pain, palpitations and leg swelling.  Gastrointestinal: Positive for constipation. Negative for nausea and vomiting.  Genitourinary: Negative for dysuria and hematuria.  Musculoskeletal: Positive for back pain.  Skin: Negative for rash.  Neurological: Positive for dizziness, focal weakness and weakness. Negative for seizures and headaches.  Psychiatric/Behavioral: Positive for  depression.  All other systems reviewed and are negative.  Past Medical History  Diagnosis Date  . ANEMIA 10/21/2008  . AORTIC INSUFFICIENCY 11/26/2009  . BACK PAIN 06/27/2007  . COLON CANCER, PERSONAL HX 02/25/2010  . Diverticulosis of colon (without mention of hemorrhage) 08/14/2006  . GALLSTONES 10/09/2008  . GLAUCOMA NOS 08/14/2006  . HYPERTENSION 08/14/2006  . HYPOKALEMIA, HX OF 11/26/2009  . MELENA, HX OF 10/12/2008  . NEOPLASM, MALIGNANT, COLON, CECUM 10/30/2008  . OCCLUSION, CENTRAL RETINAL ARTERY 08/14/2006  . PERSONAL HX COLONIC POLYPS 10/21/2008  . SYNCOPE, HX OF 08/14/2006  . URI 09/07/2008  . H/O: stroke     behind right eye   Past Surgical History  Procedure Laterality Date  . Appendectomy    . Cataract extraction      bilateral  . Dilation and curettage of uterus    . Tonsillectomy    . Colon surgery      11/20/2008,gallbladder removed  . Ankle fracture surgery  2011    right   Family History  Problem Relation Age of Onset  . Stroke Neg Hx   . Heart disease Neg Hx   . Breast cancer Sister   . Heart attack Father   . Lung cancer Brother    Social History:  reports that she has never smoked. She has never used smokeless tobacco. She reports that she does not drink alcohol or use illicit drugs. Allergies:  Allergies  Allergen Reactions  . Morphine And Related Shortness Of Breath  . Sulfa Antibiotics Itching and Swelling  . Tape Other (See Comments)    PLEASE USE COBAN WRAP/PATIENT BRUISES EASILY AND HER SKIN IS THIN  . Amoxicillin Itching and Rash  . Ciprofloxacin Itching and Rash  . Methocarbamol Itching  . Trimpex [Trimethoprim] Itching and Rash   Medications Prior to Admission  Medication Sig Dispense Refill  . aspirin 81 MG tablet Take 1 tablet (81 mg total) by mouth daily. 90 tablet 6  . atenolol (TENORMIN) 50  MG tablet Take 1 tablet (50 mg total) by mouth daily. 90 tablet 1  . bimatoprost (LUMIGAN) 0.01 % SOLN Place 1 drop into the right eye at bedtime.    . brimonidine (ALPHAGAN P) 0.1 % SOLN Place 1 drop into both eyes every 8 (eight) hours.     . Calcium Carb-Cholecalciferol (CALCIUM 600/VITAMIN D3) 600-800 MG-UNIT TABS Take 1 tablet by mouth daily.    . diphenhydramine-acetaminophen (TYLENOL PM) 25-500 MG TABS tablet Take 1 tablet by mouth at bedtime.    Marland Kitchen escitalopram (LEXAPRO) 10 MG tablet TAKE 1 TABLET (10 MG TOTAL) BY MOUTH DAILY. 90 tablet 1  . vitamin C (ASCORBIC ACID) 500 MG tablet Take 500 mg by mouth daily.      Home: Home Living Family/patient expects to be discharged to:: Private residence Living Arrangements: Spouse/significant other Available Help at Discharge: Family, Available 24 hours/day Type of Home: House Home Access: Stairs to enter [employer] of Steps: 1 Entrance Stairs-Rails: None Home Layout: One level Bathroom Shower/Tub: Chiropodist: [place]: Civil engineer, contracting, Radio producer - quad Adaptive Equipment: Reacher Additional Comments: Pt lives with granddaughter. Granddaughter has 14 mo twins and has moved in with patients daughter pending closing on a new ranch style house.  Lives With: Family  Functional History: Prior Function Level of Independence: Independent with assistive device(s) Comments: Pt reports she furniture walks in house and uses SPC or RW for community mobility. Pt goes to the gym x4/week and is independent with all ADLs and IADLs. Functional Status:  Mobility: Bed Mobility Overal bed mobility: Needs Assistance Bed Mobility: Supine to Sit Supine to sit: Min guard General bed mobility comments: struggle to get up to R side of the bed Transfers Overall transfer level: Independent Equipment used: None Transfers: Sit to/from Stand, [employer] Sit to Stand: Min  assist Stand pivot transfers: Mod assist, +2 physical assistance General transfer comment: be able to flex knees and knee extension, pt able to shift wait. pt attempting to dance once in standing Ambulation/Gait General Gait Details: not able today    ADL: ADL Overall ADL's : Needs assistance/impaired Eating/Feeding: Minimal assistance, Sitting Eating/Feeding Details (indicate cue type and reason): difficulty locating utensil due to baseline visual deficits Grooming: Wash/dry hands, Oral care, Minimal assistance, Standing Grooming Details (indicate cue type and reason): assist for LOB Upper Body Bathing: Set up, Sitting Lower Body Bathing: Minimal assistance, Sit to/from stand Upper Body Dressing : Set up, Sitting Lower Body Dressing: Minimal assistance, Sit to/from stand Toilet Transfer: Minimal assistance Toilet Transfer Details (indicate cue type and reason): simulate sit<>Stand eob to chair Toileting- Clothing Manipulation and Hygiene: Min guard, Sit to/from stand Functional mobility during ADLs: Minimal assistance, Cueing for safety General ADL Comments: Pt demonstrates posterior lean and lack of awareness to self in space  Cognition: Cognition Overall Cognitive Status: Impaired/Different from baseline Arousal/Alertness: Awake/alert Orientation Level: Oriented X4 Attention: Focused Focused Attention: Appears intact Memory: Appears intact Awareness: Appears intact Cognition Arousal/Alertness: Awake/alert, Lethargic Behavior During Therapy: WFL for tasks assessed/performed Overall Cognitive Status: Impaired/Different from baseline Area of Impairment: Memory, Safety/judgement, Problem solving Memory: Decreased short-term memory Safety/Judgement: Decreased awareness of safety, Decreased awareness of deficits Problem Solving: Slow processing, Difficulty sequencing, Requires verbal cues General Comments: Pt generally a bit forgetful and often forgets her train of thought in  mid-sentence.  However, pt is aware of this and attributes it to her serotonin disorder for which she is being treated.  Blood pressure 173/63, pulse 105, temperature 97.7 F (36.5 C), temperature source Oral, resp. rate 18, height 5\' 6"  (1.676 m), weight 59.3 kg (130 lb 11.7 oz), SpO2 96 %. Physical Exam  Vitals reviewed. Constitutional: She is oriented to person, place, and time. She appears well-developed and well-nourished.  HENT:  Head: Normocephalic and atraumatic.  Eyes:  Decreased vision right eye Injected sclera R>L  Neck: Normal range of motion. Neck supple. No thyromegaly present.  Cardiovascular: Normal rate and regular rhythm.  Respiratory: Effort normal and breath sounds normal. No respiratory distress.  GI: Soft. Bowel sounds are normal. She exhibits no distension.  Musculoskeletal: She exhibits no edema or tenderness.  Neurological: She is alert and oriented to person, place, and time.  Speech is dysarthric but intelligible.  She makes good eye contact with examiner.  Follows simple commands.  Fair awareness of deficits. DTRs 3+ LLE Sensation intact to light touch HOH Motor: RUE/RLE: 5/5 proximal to distal LUE/LLE:4/5 proximal to distal  Skin: Skin is warm and dry.  Psychiatric: She has a normal mood and affect. Her behavior is normal.     Lab Results Last 24 Hours    Results for orders placed or performed during the hospital encounter of 10/03/15 (from the past 24 hour(s))  Lipid panel Status: Abnormal   Collection Time: 10/04/15 8:01 AM  Result Value Ref Range   Cholesterol 120 0 - 200 mg/dL   Triglycerides 79 <150 mg/dL   HDL 33 (L) >40 mg/dL   Total CHOL/HDL Ratio 3.6 RATIO   VLDL 16 0 - 40 mg/dL   LDL Cholesterol 71 0 - 99 mg/dL      Imaging Results (Last 48 hours)    Ct Angio Head W Or Wo Contrast  10/03/2015 CLINICAL DATA: Assess stroke or blockage. LEFT-sided weakness and dysarthria beginning at 0430 hours.  EXAM: CT PERFUSION CT ANGIOGRAPHY OF THE HEAD AND NECK TECHNIQUE: Multidetector CT imaging of the head and neck was performed using the standard protocol during bolus administration of intravenous contrast. Multiplanar CT image reconstructions and MIPs were obtained to evaluate the vascular anatomy. Carotid stenosis measurements (when applicable) are obtained utilizing NASCET criteria, using the distal internal carotid diameter as the denominator. CT perfusion with off line post processing. CONTRAST: 50 cc Isovue 370 COMPARISON: CT HEAD October 03, 2015 at 0505 hours FINDINGS: CT PERFUSION No large vascular territory perfusion abnormality. CTA NECK AORTIC ARCH: Normal appearance of the thoracic arch, normal branch pattern. Moderate calcific atherosclerosis. Origin of the arch vessels incompletely imaged. RIGHT CAROTID SYSTEM: Common carotid artery is widely patent, coursing in a straight line fashion. Normal appearance of the carotid bifurcation without hemodynamically significant stenosis by NASCET criteria. Mild eccentric calcific atherosclerosis. Normal appearance of the included internal carotid artery. LEFT CAROTID SYSTEM: Common carotid artery is widely patent, coursing in a straight line fashion. Normal appearance of the carotid bifurcation without hemodynamically significant stenosis by NASCET criteria. Mild eccentric calcific atherosclerosis. Normal appearance of the included internal carotid artery. VERTEBRAL ARTERIES:Left vertebral artery is dominant. Normal appearance of the vertebral arteries, which appear widely patent. SKELETON: No acute osseous process though bone windows have not been submitted. OTHER NECK: Soft tissues of the neck are non-acute though, not tailored for evaluation. 11 mm RIGHT apical lung calcification. Less than 1 cm in LEFT thyroid nodule is below size followup recommendation. Atrophic submandibular glands. 10 mm carinal  lymph node. CTA HEAD ANTERIOR CIRCULATION: Normal appearance  of the cervical internal carotid arteries, petrous, cavernous and supra clinoid internal carotid arteries. Widely patent anterior communicating artery. Normal appearance of the anterior and middle cerebral arteries. No large vessel occlusion, hemodynamically significant stenosis, dissection, contrast extravasation or aneurysm. Moderate luminal irregularity of the anterior and middle cerebral arteries. POSTERIOR CIRCULATION: Normal appearance of the vertebral arteries, vertebrobasilar junction and basilar artery, as well as main branch vessels. Normal appearance of the posterior cerebral arteries. Fetal origin bilateral posterior cerebral arteries. No large vessel occlusion, hemodynamically significant stenosis, dissection, contrast extravasation or aneurysm. Moderate luminal irregularity of the posterior cerebral arteries. VENOUS SINUSES: Major dural venous sinuses are patent though not tailored for evaluation on this angiographic examination. ANATOMIC VARIANTS: None. DELAYED PHASE: Not performed. IMPRESSION: CT PERFUSION: No large vascular territory perfusion abnormality. CTA NECK: Atherosclerosis without hemodynamically significant stenosis or acute vascular process. 10 mm carinal lymph node may be re- active, incompletely evaluated. CTA HEAD: No emergent large vessel occlusion or severe stenosis. Moderate luminal irregularity of the cerebral arteries compatible with atherosclerosis. Acute findings discussed with and reconfirmed by Dr.MCNEILL [facility] on 10/03/2015 at 6:35 am. Electronically Signed By: Elon Alas M.D. On: 10/03/2015 06:45   Dg Chest 2 View  10/03/2015 CLINICAL DATA: Left-sided weakness. Hypertension. EXAM: CHEST 2 VIEW COMPARISON: Chest radiograph and chest CT April 02, 2015 FINDINGS: There is a calcified granuloma in the right upper lobe. There is no edema or consolidation. Heart is mildly enlarged with pulmonary vascularity within normal limits. There is atherosclerotic  calcification in the aorta. No adenopathy. There is degenerative change in each shoulder. There is also degenerative change in the thoracic spine. IMPRESSION: Mild cardiomegaly. No edema or consolidation. Calcified granuloma right upper lobe. Aortic atherosclerosis. Electronically Signed By: Lowella Grip III M.D. On: 10/03/2015 10:13   Ct Angio Neck W Or Wo Contrast  10/03/2015 CLINICAL DATA: Assess stroke or blockage. LEFT-sided weakness and dysarthria beginning at 0430 hours. EXAM: CT PERFUSION CT ANGIOGRAPHY OF THE HEAD AND NECK TECHNIQUE: Multidetector CT imaging of the head and neck was performed using the standard protocol during bolus administration of intravenous contrast. Multiplanar CT image reconstructions and MIPs were obtained to evaluate the vascular anatomy. Carotid stenosis measurements (when applicable) are obtained utilizing NASCET criteria, using the distal internal carotid diameter as the denominator. CT perfusion with off line post processing. CONTRAST: 50 cc Isovue 370 COMPARISON: CT HEAD October 03, 2015 at 0505 hours FINDINGS: CT PERFUSION No large vascular territory perfusion abnormality. CTA NECK AORTIC ARCH: Normal appearance of the thoracic arch, normal branch pattern. Moderate calcific atherosclerosis. Origin of the arch vessels incompletely imaged. RIGHT CAROTID SYSTEM: Common carotid artery is widely patent, coursing in a straight line fashion. Normal appearance of the carotid bifurcation without hemodynamically significant stenosis by NASCET criteria. Mild eccentric calcific atherosclerosis. Normal appearance of the included internal carotid artery. LEFT CAROTID SYSTEM: Common carotid artery is widely patent, coursing in a straight line fashion. Normal appearance of the carotid bifurcation without hemodynamically significant stenosis by NASCET criteria. Mild eccentric calcific atherosclerosis. Normal appearance of the included internal carotid artery. VERTEBRAL ARTERIES:Left  vertebral artery is dominant. Normal appearance of the vertebral arteries, which appear widely patent. SKELETON: No acute osseous process though bone windows have not been submitted. OTHER NECK: Soft tissues of the neck are non-acute though, not tailored for evaluation. 11 mm RIGHT apical lung calcification. Less than 1 cm in LEFT thyroid nodule is below size followup recommendation. Atrophic submandibular glands. 10 mm  carinal lymph node. CTA HEAD ANTERIOR CIRCULATION: Normal appearance of the cervical internal carotid arteries, petrous, cavernous and supra clinoid internal carotid arteries. Widely patent anterior communicating artery. Normal appearance of the anterior and middle cerebral arteries. No large vessel occlusion, hemodynamically significant stenosis, dissection, contrast extravasation or aneurysm. Moderate luminal irregularity of the anterior and middle cerebral arteries. POSTERIOR CIRCULATION: Normal appearance of the vertebral arteries, vertebrobasilar junction and basilar artery, as well as main branch vessels. Normal appearance of the posterior cerebral arteries. Fetal origin bilateral posterior cerebral arteries. No large vessel occlusion, hemodynamically significant stenosis, dissection, contrast extravasation or aneurysm. Moderate luminal irregularity of the posterior cerebral arteries. VENOUS SINUSES: Major dural venous sinuses are patent though not tailored for evaluation on this angiographic examination. ANATOMIC VARIANTS: None. DELAYED PHASE: Not performed. IMPRESSION: CT PERFUSION: No large vascular territory perfusion abnormality. CTA NECK: Atherosclerosis without hemodynamically significant stenosis or acute vascular process. 10 mm carinal lymph node may be re- active, incompletely evaluated. CTA HEAD: No emergent large vessel occlusion or severe stenosis. Moderate luminal irregularity of the cerebral arteries compatible with atherosclerosis. Acute findings discussed with and reconfirmed  by Dr.MCNEILL [facility] on 10/03/2015 at 6:35 am. Electronically Signed By: Elon Alas M.D. On: 10/03/2015 06:45   Mr Brain Wo Contrast  10/03/2015 CLINICAL DATA: 82 year old female with acute left leg weakness in the middle of the night. Code stroke. Initial encounter. EXAM: MRI HEAD WITHOUT CONTRAST TECHNIQUE: Multiplanar, multiecho pulse sequences of the brain and surrounding structures were obtained without intravenous contrast. COMPARISON: Head CT, CTA, and CT perfusion without contrast 0505 hours today, and earlier. FINDINGS: 15 mm area of confluent restricted diffusion tracking from the posterior aspect of the right corona radiata toward the posterior right lentiform nuclei (series 10, image 16). Mild associated T2 and FLAIR hyperintensity. No associated hemorrhage or mass effect. No other restricted diffusion. Major intracranial vascular flow voids are preserved. Widespread increased perivascular spaces in the deep gray matter nuclei. Moderate T2 hyperintensity in the thalami compatible with chronic small vessel ischemia worse on the left. Patchy and confluent bilateral cerebral white matter T2 and FLAIR hyperintensity. No cortical encephalomalacia identified. There are occasional chronic micro hemorrhages in the brain, including at the posterior limb of the left external capsule, in the left paracentral pons, and in the left deep cerebellar nuclei. Minimal T2 heterogeneity in the pons. No midline shift, mass effect, evidence of mass lesion, ventriculomegaly, extra-axial collection or acute intracranial hemorrhage. Cervicomedullary junction and pituitary are within normal limits. Negative visualized cervical spine. Visualized bone marrow signal is within normal limits. Visible internal auditory structures appear normal. Trace mastoid fluid. Mild paranasal sinus mucosal thickening. Postoperative changes to both globes. Otherwise negative orbit and scalp soft tissues. IMPRESSION: 1. Acute  lacunar type infarct tracking from the posterior right corona radiata to the posterior lentiform nuclei. No associated hemorrhage or mass effect. 2. Underlying chronic small vessel disease, moderate for age. Electronically Signed By: Genevie Ann M.D. On: 10/03/2015 12:14   Ct Cerebral Perfusion W Contrast  10/03/2015 CLINICAL DATA: Assess stroke or blockage. LEFT-sided weakness and dysarthria beginning at 0430 hours. EXAM: CT PERFUSION CT ANGIOGRAPHY OF THE HEAD AND NECK TECHNIQUE: Multidetector CT imaging of the head and neck was performed using the standard protocol during bolus administration of intravenous contrast. Multiplanar CT image reconstructions and MIPs were obtained to evaluate the vascular anatomy. Carotid stenosis measurements (when applicable) are obtained utilizing NASCET criteria, using the distal internal carotid diameter as the denominator. CT perfusion with off line post processing. CONTRAST:  50 cc Isovue 370 COMPARISON: CT HEAD October 03, 2015 at 0505 hours FINDINGS: CT PERFUSION No large vascular territory perfusion abnormality. CTA NECK AORTIC ARCH: Normal appearance of the thoracic arch, normal branch pattern. Moderate calcific atherosclerosis. Origin of the arch vessels incompletely imaged. RIGHT CAROTID SYSTEM: Common carotid artery is widely patent, coursing in a straight line fashion. Normal appearance of the carotid bifurcation without hemodynamically significant stenosis by NASCET criteria. Mild eccentric calcific atherosclerosis. Normal appearance of the included internal carotid artery. LEFT CAROTID SYSTEM: Common carotid artery is widely patent, coursing in a straight line fashion. Normal appearance of the carotid bifurcation without hemodynamically significant stenosis by NASCET criteria. Mild eccentric calcific atherosclerosis. Normal appearance of the included internal carotid artery. VERTEBRAL ARTERIES:Left vertebral artery is dominant. Normal appearance of the vertebral  arteries, which appear widely patent. SKELETON: No acute osseous process though bone windows have not been submitted. OTHER NECK: Soft tissues of the neck are non-acute though, not tailored for evaluation. 11 mm RIGHT apical lung calcification. Less than 1 cm in LEFT thyroid nodule is below size followup recommendation. Atrophic submandibular glands. 10 mm carinal lymph node. CTA HEAD ANTERIOR CIRCULATION: Normal appearance of the cervical internal carotid arteries, petrous, cavernous and supra clinoid internal carotid arteries. Widely patent anterior communicating artery. Normal appearance of the anterior and middle cerebral arteries. No large vessel occlusion, hemodynamically significant stenosis, dissection, contrast extravasation or aneurysm. Moderate luminal irregularity of the anterior and middle cerebral arteries. POSTERIOR CIRCULATION: Normal appearance of the vertebral arteries, vertebrobasilar junction and basilar artery, as well as main branch vessels. Normal appearance of the posterior cerebral arteries. Fetal origin bilateral posterior cerebral arteries. No large vessel occlusion, hemodynamically significant stenosis, dissection, contrast extravasation or aneurysm. Moderate luminal irregularity of the posterior cerebral arteries. VENOUS SINUSES: Major dural venous sinuses are patent though not tailored for evaluation on this angiographic examination. ANATOMIC VARIANTS: None. DELAYED PHASE: Not performed. IMPRESSION: CT PERFUSION: No large vascular territory perfusion abnormality. CTA NECK: Atherosclerosis without hemodynamically significant stenosis or acute vascular process. 10 mm carinal lymph node may be re- active, incompletely evaluated. CTA HEAD: No emergent large vessel occlusion or severe stenosis. Moderate luminal irregularity of the cerebral arteries compatible with atherosclerosis. Acute findings discussed with and reconfirmed by Dr.MCNEILL [facility] on 10/03/2015 at 6:35 am. Electronically  Signed By: Elon Alas M.D. On: 10/03/2015 06:45   Ct Head Code Stroke W/o Cm  10/03/2015 CLINICAL DATA: Acute onset of left-sided weakness. Code stroke. Initial encounter. EXAM: CT HEAD WITHOUT CONTRAST TECHNIQUE: Contiguous axial images were obtained from the base of the skull through the vertex without intravenous contrast. COMPARISON: CT of the head performed 11/07/2009 FINDINGS: There is no evidence of acute infarction, mass lesion, or intra- or extra-axial hemorrhage on CT. Prominence of the ventricles and sulci reflects mild cortical volume loss. Scattered periventricular and subcortical white matter change likely reflects small vessel ischemic microangiopathy. The brainstem and fourth ventricle are within normal limits. The basal ganglia are unremarkable in appearance. The cerebral hemispheres demonstrate grossly normal gray-white differentiation. No mass effect or midline shift is seen. There is no evidence of fracture; visualized osseous structures are unremarkable in appearance. Postoperative change is noted at the left optic globe. The right orbit is unremarkable in appearance. The paranasal sinuses and mastoid air cells are well-aerated. No significant soft tissue abnormalities are seen. IMPRESSION: 1. No acute intracranial pathology seen on CT. 2. Mild cortical volume loss and scattered small vessel ischemic microangiopathy. These results were called by telephone at the time  of interpretation on 10/03/2015 at 5:28 am to Dr. Roland Rack, who verbally acknowledged these results. Electronically Signed By: Garald Balding M.D. On: 10/03/2015 05:27     Assessment/Plan: Diagnosis: Right basal ganglia corona radiata infarct Labs and images independently reviewed. Records reviewed and summated above. Stroke: Continue secondary stroke prophylaxis and Risk Factor Modification listed below:  Antiplatelet therapy:  Blood Pressure Management: Continue current medication with  prn's with permisive HTN per primary team Statin Agent:  Left sided hemiparesis: fit for orthosis to prevent contractures (resting hand splint for day, wrist cock up splint at night, PRAFO, etc Motor recovery: Fluoxetine  1. Does the need for close, 24 hr/day medical supervision in concert with the patient's rehab needs make it unreasonable for this patient to be served in a less intensive setting? Yes  2. 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 Due to bowel management, safety, disease management and patient education, does the patient require 24 hr/day rehab nursing? Yes 4. Does the patient require coordinated care of a physician, rehab nurse, PT (1-2 hrs/day, 5 days/week), OT (1-2 hrs/day, 51-2 days/week) and SLP (5 hrs/day, 1-2 days/week) to address physical and functional deficits in the context of the above medical diagnosis(es)? Yes Addressing deficits in the following areas: balance, endurance, locomotion, strength, transferring, bathing, dressing, toileting and psychosocial support 5. Can the patient actively participate in an intensive therapy program of at least 3 hrs of therapy per day at least 5 days per week? Yes 6. The potential for patient to make measurable gains while on inpatient rehab is excellent 7. Anticipated functional outcomes upon discharge from inpatient rehab are supervision and min assist with PT, supervision with OT, independent and modified independent with SLP. 8. Estimated rehab length of stay to reach the above functional goals is: 14-18 days. 9. Does the patient have adequate social supports and living environment to accommodate these discharge functional goals?  Potentially 10. Anticipated D/C setting: Home 11. Anticipated post D/C treatments: HH therapy and Home excercise program 12. Overall Rehab/Functional Prognosis: good  RECOMMENDATIONS: This patient's condition is appropriate for continued rehabilitative care in the following setting: CIR Patient has agreed to participate in recommended program. Yes Note that insurance prior authorization may be required for reimbursement for recommended care.  Comment: Rehab Admissions Coordinator to follow up.  Delice Lesch, MD 10/04/2015       Revision History     Date/Time User Provider Type Action   10/04/2015 3:05 PM Ankit Lorie Phenix, MD Physician Sign   10/04/2015 2:13 PM Cathlyn Parsons, PA-C Physician Assistant Pend   View Details Report       Routing History     Date/Time From To Method   10/04/2015 3:05 PM Ankit Lorie Phenix, MD Marletta Lor, MD In [facility]

## 2015-10-06 ENCOUNTER — Inpatient Hospital Stay (HOSPITAL_COMMUNITY): Payer: Medicare Other | Admitting: Physical Therapy

## 2015-10-06 ENCOUNTER — Inpatient Hospital Stay (HOSPITAL_COMMUNITY): Payer: Medicare Other | Admitting: Speech Pathology

## 2015-10-06 ENCOUNTER — Inpatient Hospital Stay (HOSPITAL_COMMUNITY): Payer: Medicare Other | Admitting: Occupational Therapy

## 2015-10-06 DIAGNOSIS — I6329 Cerebral infarction due to unspecified occlusion or stenosis of other precerebral arteries: Secondary | ICD-10-CM

## 2015-10-06 LAB — CBC WITH DIFFERENTIAL/PLATELET
BASOS ABS: 0 10*3/uL (ref 0.0–0.1)
Basophils Relative: 0 %
EOS PCT: 3 %
Eosinophils Absolute: 0.2 10*3/uL (ref 0.0–0.7)
HEMATOCRIT: 38.2 % (ref 36.0–46.0)
Hemoglobin: 12.7 g/dL (ref 12.0–15.0)
LYMPHS ABS: 2.1 10*3/uL (ref 0.7–4.0)
LYMPHS PCT: 27 %
MCH: 29.1 pg (ref 26.0–34.0)
MCHC: 33.2 g/dL (ref 30.0–36.0)
MCV: 87.6 fL (ref 78.0–100.0)
MONO ABS: 0.8 10*3/uL (ref 0.1–1.0)
MONOS PCT: 10 %
NEUTROS ABS: 4.6 10*3/uL (ref 1.7–7.7)
Neutrophils Relative %: 60 %
PLATELETS: 220 10*3/uL (ref 150–400)
RBC: 4.36 MIL/uL (ref 3.87–5.11)
RDW: 14.3 % (ref 11.5–15.5)
WBC: 7.7 10*3/uL (ref 4.0–10.5)

## 2015-10-06 LAB — COMPREHENSIVE METABOLIC PANEL
ALT: 13 U/L — ABNORMAL LOW (ref 14–54)
AST: 28 U/L (ref 15–41)
Albumin: 3.5 g/dL (ref 3.5–5.0)
Alkaline Phosphatase: 55 U/L (ref 38–126)
Anion gap: 8 (ref 5–15)
BILIRUBIN TOTAL: 0.6 mg/dL (ref 0.3–1.2)
BUN: 10 mg/dL (ref 6–20)
CO2: 25 mmol/L (ref 22–32)
Calcium: 8.9 mg/dL (ref 8.9–10.3)
Chloride: 105 mmol/L (ref 101–111)
Creatinine, Ser: 0.59 mg/dL (ref 0.44–1.00)
Glucose, Bld: 124 mg/dL — ABNORMAL HIGH (ref 65–99)
POTASSIUM: 3.5 mmol/L (ref 3.5–5.1)
Sodium: 138 mmol/L (ref 135–145)
TOTAL PROTEIN: 6.8 g/dL (ref 6.5–8.1)

## 2015-10-06 NOTE — Evaluation (Signed)
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 HOH (hard of hearing)   . Acute ischemic stroke ([place]) 10/04/2015  . Hyperlipidemia 10/04/2015  . Cerebrovascular accident (CVA) due to occlusion of cerebral artery ([place])   . Tachycardia   . Dysphagia, post-stroke   . Stroke ([place]) 10/03/2015  . CVA (cerebral infarction) 10/03/2015  . Hypotension due to drugs   . HLD (hyperlipidemia)   . Legally blind   . Notalgia   . Fall 04/02/2015  . Depression 01/26/2015  . Essential hypertension   . Aortic valve disorder 11/26/2009  . NEOPLASM, MALIGNANT, COLON, CECUM 10/30/2008  . ANEMIA 10/21/2008  . PERSONAL HX COLONIC POLYPS 10/21/2008  . GALLSTONES 10/09/2008  . Backache 06/27/2007  . OCCLUSION, CENTRAL RETINAL ARTERY 08/14/2006  . Glaucoma 08/14/2006  . Diverticulosis of colon (without mention of hemorrhage) 08/14/2006  . SYNCOPE, HX OF 08/14/2006    Past Medical History:  Past Medical History  Diagnosis Date  . ANEMIA 10/21/2008  . AORTIC INSUFFICIENCY 11/26/2009  . BACK PAIN 06/27/2007  . COLON CANCER, PERSONAL HX 02/25/2010  . Diverticulosis of colon (without mention of hemorrhage) 08/14/2006  . GALLSTONES 10/09/2008  . GLAUCOMA NOS 08/14/2006  . HYPERTENSION 08/14/2006  . HYPOKALEMIA, HX OF 11/26/2009  . MELENA, HX OF 10/12/2008  . NEOPLASM, MALIGNANT, COLON, CECUM 10/30/2008  . OCCLUSION, CENTRAL RETINAL ARTERY 08/14/2006  . PERSONAL HX COLONIC POLYPS 10/21/2008  . SYNCOPE, HX OF 08/14/2006  . URI 09/07/2008  . H/O: stroke      behind right eye   Past Surgical History:  Past Surgical History  Procedure Laterality Date  . Appendectomy    . Cataract extraction      bilateral  . Dilation and curettage of uterus    . Tonsillectomy    . Colon surgery      11/20/2008,gallbladder removed  . Ankle fracture surgery  2011    right    Assessment & Plan Clinical Impression: Patient is a 82 y.o. right handed female with history of Colorectal cancer diagnosed 2010 status post resection followed by Dr. 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 Patient lives with granddaughter and 2-monthold twins. Used an occasional cane prior to admission. Granddaughter works out of the house. Presented 10/03/2015 left sided weakness and slurred speech. Cranial CT scan negative. MRI of the brain showed acute lacunar type infarct tracking from the posterior right corona radiata to the posterior lentiform nuclei. CT angiogram of head and neck no acute findings. Echocardiogram with ejection fraction 646%grade 1 diastolic dysfunction. Patient did not receive TPA. Neurology consulted presently on Plavix for CVA prophylaxis. Subcutaneous Lovenox for DVT prophylaxis. Dysphagia #2 thin liquid diet. Physical and occupational therapy evaluations completed 10/04/2015 with recommendations of physical medicine rehabilitation consult.Patient was admitted for a comprehensive rehabilitation program.  Patient transferred to CIR on 10/05/2015 .    Patient currently requires mod with basic self-care skills secondary to impaired timing and sequencing and unbalanced muscle activation and decreased sitting balance, decreased standing balance, decreased postural control and hemiplegia.  Prior to  hospitalization, patient could complete ADLs and IADLs with min assist to supervision.   Patient will benefit from skilled intervention to increase independence with basic self-care skills and increase level of independence with iADL prior to discharge  home with care partner.  Anticipate patient will require 24 hour supervision and follow up outpatient.  OT - End of Session Activity Tolerance: Tolerates 30+ min activity without fatigue Endurance Deficit: No OT Assessment Rehab Potential (ACUTE ONLY): Fair OT Patient demonstrates impairments in the following area(s): Balance;Endurance;Motor;Sensory;Skin Integrity;Cognition;Vision OT Basic ADL's Functional Problem(s): Grooming;Bathing;Dressing;Toileting;Eating OT Advanced ADL's Functional Problem(s): Light Housekeeping OT Transfers Functional Problem(s): Toilet;Tub/Shower OT Additional Impairment(s): Fuctional Use of Upper Extremity OT Plan OT Intensity: Minimum of 1-2 x/day, 45 to 90 minutes OT Frequency: 5 out of 7 days OT Duration/Estimated Length of Stay: 14-17 days  OT Treatment/Interventions: Balance/vestibular training;Cognitive remediation/compensation;Discharge planning;Disease mangement/prevention;DME/adaptive equipment instruction;Neuromuscular re-education;Functional mobility training;Pain management;Patient/family education;Psychosocial support;UE/LE Strength taining/ROM;Therapeutic Exercise;Therapeutic Activities;Visual/perceptual remediation/compensation;UE/LE Coordination activities;Self Care/advanced ADL retraining OT Self Feeding Anticipated Outcome(s): Mod I OT Basic Self-Care Anticipated Outcome(s): Min A  OT Toileting Anticipated Outcome(s): Supervision  OT Bathroom Transfers Anticipated Outcome(s): Min A  OT Recommendation Patient destination: Home Follow Up Recommendations: Home health OT Equipment Details: Pt reports DME at home to include: BSC, tub shower bench, shower chair, cane, and RW.    Skilled Therapeutic Intervention  Treatment session with focus on evaluating current level of function in self care tasks. Pt awake, alert, and agreeable to therapy session. Pt completed sit <> stand from EOB and squat pivot transfers for WC <> bed and WC <> shower with  verbal cues for hand placement and initiation. Pt states dtr assists with showering tasks on request. Pt expresses fear of falling and reports having fallen 3x thus far this year. Plan to educate on and problem solve need for LB dressing AD such as reacher for increased independence and decreased burden of care post d/c. Pt currently at Mod A for dressing and showering tasks secondary to impacts on sequencing, reduced functional gross and fine motor skills in LUE, problem solving, and postural instability.    OT Evaluation Precautions/Restrictions  Precautions Precautions: Fall Restrictions Weight Bearing Restrictions: No General   Vital Signs  Pain Pain Assessment Pain Assessment: No/denies pain Home Living/Prior Functioning Home Living Available Help at Discharge: Family, Available 24 hours/day Type of Home: House Home Access: Stairs to enter [employer] of Steps: 1 Entrance Stairs-Rails: None Home Layout: One level Bathroom Shower/Tub: Chiropodist: Standard Additional Comments: Pt lives with daughter, granddaughter, and 46mo twin great grandchildren. Anticipate moving to new single story home in two weeks.   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 Unable to ascertain frequency of community outings.  ADL   Vision/Perception  Vision- History Baseline Vision/History: Glaucoma. Blind in R eye.  Wears Glasses: Reading only Patient Visual Report: No change from baseline Vision- Assessment Vision Assessment?: Vision impaired- to be further  tested in functional context Additional Comments: Appears to have L visual field deficits. Further assessment warranted.   Cognition Overall Cognitive Status: Impaired/Different from baseline Arousal/Alertness: Awake/alert Orientation Level: Person;Place;Situation Person: Oriented Place: Oriented Situation: Oriented Year: 2017 Month: July  Day of Week: Correct Memory: Appears  intact Immediate Memory Recall: Sock;Blue;Bed Memory Recall: Sock;Blue;Bed Memory Recall Sock: Without Cue Memory Recall Blue: Without Cue Memory Recall Bed: Without Cue Attention: Sustained Focused Attention: Appears intact Sustained Attention: Appears intact Awareness: Appears intact Problem Solving: Appears intact Executive Function: Sequencing Sequencing: Impaired Sequencing Impairment: Verbal basic Safety/Judgment: Appears intact Sensation Sensation Light Touch: Appears Intact Hot/Cold: Appears Intact Proprioception: Appears Intact Coordination Gross Motor Movements are Fluid and Coordinated: No. Delayed initiation of gross motor movement in LUE.  Fine Motor Movements are Fluid and Coordinated: No. Trace fine motor movements emerging in LUE.  Coordination and Movement Description: Slight tremor noted in RUE. Delayed initiation of movement from sit to stand.  Finger to nose: RUE tested and WFL. LUE not tested due to hemiplegia.  Motor    Mobility     Trunk/Postural Assessment     Balance   Extremity/Trunk Assessment RUE Assessment RUE Assessment: Within Functional Limits LUE Assessment LUE Assessment: Exceptions to WFL LUE AROM (degrees) Overall AROM Left Upper Extremity: Deficits   See Function Navigator for Current Functional Status.   Refer to Care Plan for Long Term Goals  Recommendations for other services: None  Discharge Criteria: Patient will be discharged from OT if patient refuses treatment 3 consecutive times without medical reason, if treatment goals not met, if there  is a change in medical status, if patient makes no progress towards goals or if patient is discharged from hospital.  The above assessment, treatment plan, treatment alternatives and goals were discussed and mutually agreed upon: by patient  Dierdre Searles 10/06/2015, 11:33 AM

## 2015-10-06 NOTE — Progress Notes (Signed)
82 y.o. right handed female with history of Colorectal cancer diagnosed 2010 status post resection followed by Dr. 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 Patient lives with granddaughter and 37-monthold twins. Used an occasional cane prior to admission. Granddaughter works out of the house. Presented 10/03/2015 left sided weakness and slurred speech. Cranial CT scan negative. MRI of the brain showed acute lacunar type infarct tracking from the posterior right corona radiata to the posterior lentiform nuclei. CT angiogram of head and neck no acute findings. Echocardiogram with ejection fraction 631%grade 1 diastolic dysfunction. Patient did not receive TPA. Neurology consulted presently on Plavix for CVA prophylaxis. Subcutaneous Lovenox for DVT prophylaxis. Dysphagia #2 thin liquid diet  Subjective/Complaints: No issues overnite.  Ate 100% breakfast , had SHower with OT today ROS- denies CP, SOB, N/V/D Objective: Vital Signs: Blood pressure 192/78, pulse 83, temperature 97.6 F (36.4 C), temperature source Oral, resp. rate 18, height '5\' 7"'  (1.702 m), weight 68.13 kg (150 lb 3.2 oz), SpO2 98 %. No results found. Results for orders placed or performed during the hospital encounter of 10/05/15 (from the past 72 hour(s))  CBC     Status: None   Collection Time: 10/05/15  7:28 PM  Result Value Ref Range   WBC 8.2 4.0 - 10.5 K/uL   RBC 4.37 3.87 - 5.11 MIL/uL   Hemoglobin 12.7 12.0 - 15.0 g/dL   HCT 38.9 36.0 - 46.0 %   MCV 89.0 78.0 - 100.0 fL   MCH 29.1 26.0 - 34.0 pg   MCHC 32.6 30.0 - 36.0 g/dL   RDW 14.4 11.5 - 15.5 %   Platelets 220 150 - 400 K/uL  Creatinine, serum     Status: Abnormal   Collection Time: 10/05/15  7:28 PM  Result Value Ref Range   Creatinine, Ser 0.88 0.44 - 1.00 mg/dL   GFR calc non Af Amer 56 (L) >60 mL/min   GFR calc Af Amer >60 >60 mL/min    Comment: (NOTE) The eGFR has been calculated using the CKD EPI equation. This calculation has not  been validated in all clinical situations. eGFR's persistently <60 mL/min signify possible Chronic Kidney Disease.   CBC WITH DIFFERENTIAL     Status: None   Collection Time: 10/06/15  7:21 AM  Result Value Ref Range   WBC 7.7 4.0 - 10.5 K/uL   RBC 4.36 3.87 - 5.11 MIL/uL   Hemoglobin 12.7 12.0 - 15.0 g/dL   HCT 38.2 36.0 - 46.0 %   MCV 87.6 78.0 - 100.0 fL   MCH 29.1 26.0 - 34.0 pg   MCHC 33.2 30.0 - 36.0 g/dL   RDW 14.3 11.5 - 15.5 %   Platelets 220 150 - 400 K/uL   Neutrophils Relative % 60 %   Neutro Abs 4.6 1.7 - 7.7 K/uL   Lymphocytes Relative 27 %   Lymphs Abs 2.1 0.7 - 4.0 K/uL   Monocytes Relative 10 %   Monocytes Absolute 0.8 0.1 - 1.0 K/uL   Eosinophils Relative 3 %   Eosinophils Absolute 0.2 0.0 - 0.7 K/uL   Basophils Relative 0 %   Basophils Absolute 0.0 0.0 - 0.1 K/uL  Comprehensive metabolic panel     Status: Abnormal   Collection Time: 10/06/15  7:21 AM  Result Value Ref Range   Sodium 138 135 - 145 mmol/L   Potassium 3.5 3.5 - 5.1 mmol/L   Chloride 105 101 - 111 mmol/L   CO2 25 22 -  32 mmol/L   Glucose, Bld 124 (H) 65 - 99 mg/dL   BUN 10 6 - 20 mg/dL   Creatinine, Ser 0.59 0.44 - 1.00 mg/dL   Calcium 8.9 8.9 - 10.3 mg/dL   Total Protein 6.8 6.5 - 8.1 g/dL   Albumin 3.5 3.5 - 5.0 g/dL   AST 28 15 - 41 U/L   ALT 13 (L) 14 - 54 U/L   Alkaline Phosphatase 55 38 - 126 U/L   Total Bilirubin 0.6 0.3 - 1.2 mg/dL   GFR calc non Af Amer >60 >60 mL/min   GFR calc Af Amer >60 >60 mL/min    Comment: (NOTE) The eGFR has been calculated using the CKD EPI equation. This calculation has not been validated in all clinical situations. eGFR's persistently <60 mL/min signify possible Chronic Kidney Disease.    Anion gap 8 5 - 15     HEENT: Left eye deviated laterally and upward, no vision Cardio: RRR and no murmur Resp: CTA B/L and unlabored GI: BS positive and NT, ND Extremity:  Pulses positive and No Edema Skin:   Other erythema around IV site Neuro:  Alert/Oriented, Normal Sensory, Abnormal Motor 2- in left delt, bi, tri, 0/5 grip and Other left neglect vs visual loss L eye Musc/Skelno pain with UE or LE ROMOther no pain with UE or LE ROM Gen NAD   Assessment/Plan: 1. Functional deficits secondary to  Left-sided weakness with dysarthria/dysphagia secondary to right basal ganglia corona radiata infarct which require 3+ hours per day of interdisciplinary therapy in a comprehensive inpatient rehab setting. Physiatrist is providing close team supervision and 24 hour management of active medical problems listed below. Physiatrist and rehab team continue to assess barriers to discharge/monitor patient progress toward functional and medical goals. FIM:             Function - Chair/bed transfer Chair/bed transfer assist level: 2 helpers Chair/bed transfer assistive device: Bedrails     Function - Comprehension Comprehension: Auditory Comprehension assistive device: Hearing aids (To be brought to the hospital by daughter) Comprehension assist level: Understands basic 75 - 89% of the time/ requires cueing 10 - 24% of the time  Function - Expression Expression: Verbal Expression assist level: Expresses basic needs/ideas: With extra time/assistive device  Function - Social Interaction Social Interaction assist level: Interacts appropriately 75 - 89% of the time - Needs redirection for appropriate language or to initiate interaction.  Function - Problem Solving Problem solving assist level: Solves basic 75 - 89% of the time/requires cueing 10 - 24% of the time  Function - Memory Memory assist level: Recognizes or recalls 90% of the time/requires cueing < 10% of the time Patient normally able to recall (first 3 days only): Current season, Location of own room, That he or she is in a hospital  Medical Problem List and Plan: 1.  Left-sided weakness with dysarthria/dysphagia secondary to right basal ganglia corona radiata infarct-  start CIR 2.  DVT Prophylaxis/Anticoagulation: Subcutaneous Lovenox. Monitor platelet counts or any signs of bleeding, plt 220K 3. Pain Management: Tylenol as needed 4. Dysphagia. Dysphagia #2 thin liquid diet. Follow-up speech therapy, no signs of aspiration at present 5. Neuropsych: This patient is capable of making decisions on her own behalf. 6. Skin/Wound Care: Routine skin checks 7. Fluids/Electrolytes/Nutrition: Routine I&O's with follow-up chemistries 8. Hypertension. Permissive hypertension. Monitor with increased mobility. Patient on Tenormin 50 mg daily prior to admission. 9. Mood. Resume Lexapro 10 mg daily 10. Hard of hearing. 11. Decreased  vision right eye. 12. Hyperlipidemia. Pravachol 13. History of colorectal cancer diagnosed 2010. Status post resection. Presently in remission. Followed by Dr. Alen Blew   LOS (Days) 1 A FACE TO FACE EVALUATION WAS PERFORMED  KIRSTEINS,ANDREW E 10/06/2015, 8:41 AM

## 2015-10-06 NOTE — Evaluation (Signed)
Physical Therapy Assessment and Plan  Patient Details  Name: Karen [name] MRN: [id number] Date of Birth: 10-23-1922  PT Diagnosis: Abnormal posture, Abnormality of gait, Cognitive deficits, Difficulty walking, Hemiparesis non-dominant, Hypotonia, Impaired cognition, Impaired sensation, Low back pain and Muscle weakness Rehab Potential: Good ELOS: 14-17 days   Today's Date: 10/06/2015 PT Individual Time: 4098-1191 and 0930-1030 PT Individual Time Calculation (min): 30 min and 60 min (total 90 min)    Problem List:  Patient Active Problem List   Diagnosis Date Noted  . HOH (hard of hearing)   . Acute ischemic stroke ([place]) 10/04/2015  . Hyperlipidemia 10/04/2015  . Cerebrovascular accident (CVA) due to occlusion of cerebral artery ([place])   . Tachycardia   . Dysphagia, post-stroke   . Stroke ([place]) 10/03/2015  . CVA (cerebral infarction) 10/03/2015  . Hypotension due to drugs   . HLD (hyperlipidemia)   . Legally blind   . Notalgia   . Fall 04/02/2015  . Depression 01/26/2015  . Essential hypertension   . Aortic valve disorder 11/26/2009  . NEOPLASM, MALIGNANT, COLON, CECUM 10/30/2008  . ANEMIA 10/21/2008  . PERSONAL HX COLONIC POLYPS 10/21/2008  . GALLSTONES 10/09/2008  . Backache 06/27/2007  . OCCLUSION, CENTRAL RETINAL ARTERY 08/14/2006  . Glaucoma 08/14/2006  . Diverticulosis of colon (without mention of hemorrhage) 08/14/2006  . SYNCOPE, HX OF 08/14/2006    Past Medical History:  Past Medical History  Diagnosis Date  . ANEMIA 10/21/2008  . AORTIC INSUFFICIENCY 11/26/2009  . BACK PAIN 06/27/2007  . COLON CANCER, PERSONAL HX 02/25/2010  . Diverticulosis of colon (without mention of hemorrhage) 08/14/2006  . GALLSTONES 10/09/2008  . GLAUCOMA NOS 08/14/2006  . HYPERTENSION 08/14/2006  . HYPOKALEMIA, HX OF 11/26/2009  . MELENA, HX OF 10/12/2008  . NEOPLASM, MALIGNANT, COLON, CECUM 10/30/2008  . OCCLUSION, CENTRAL RETINAL ARTERY 08/14/2006  . PERSONAL HX COLONIC POLYPS 10/21/2008   . SYNCOPE, HX OF 08/14/2006  . URI 09/07/2008  . H/O: stroke     behind right eye   Past Surgical History:  Past Surgical History  Procedure Laterality Date  . Appendectomy    . Cataract extraction      bilateral  . Dilation and curettage of uterus    . Tonsillectomy    . Colon surgery      11/20/2008,gallbladder removed  . Ankle fracture surgery  2011    right    Assessment & Plan Clinical Impression: Patient is a 82 y.o. right handed female with history of Colorectal cancer diagnosed 2010 status post resection followed by Dr. 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 Patient lives with granddaughter and 73-monthold twins. Used an occasional cane prior to admission. Granddaughter works out of the house. Presented 10/03/2015 left sided weakness and slurred speech. Cranial CT scan negative. MRI of the brain showed acute lacunar type infarct tracking from the posterior right corona radiata to the posterior lentiform nuclei. CT angiogram of head and neck no acute findings. Echocardiogram with ejection fraction 647%grade 1 diastolic dysfunction. Patient did not receive TPA. Neurology consulted presently on Plavix for CVA prophylaxis. Subcutaneous Lovenox for DVT prophylaxis. Dysphagia #2 thin liquid diet. Physical and occupational therapy evaluations completed 10/04/2015 with recommendations of physical medicine rehabilitation consult.Patient was admitted for a comprehensive rehabilitation program.  Patient transferred to CIR on 10/05/2015 .   Patient currently requires max with mobility secondary to muscle weakness, decreased cardiorespiratoy endurance, impaired timing and sequencing, unbalanced muscle activation, decreased coordination and decreased motor planning, decreased  initiation, decreased attention, decreased awareness, decreased problem solving, decreased safety awareness, decreased memory and delayed processing and decreased sitting balance, decreased standing  balance, decreased postural control, hemiplegia and decreased balance strategies.  Prior to hospitalization, patient was supervision with mobility and lived with Family, Daughter (Daughter, granddaughter, and great grandchildren.) in a House home.  Home access is 1Stairs to enter.  Patient will benefit from skilled PT intervention to maximize safe functional mobility, minimize fall risk and decrease caregiver burden for planned discharge home with 24 hour supervision.  Anticipate patient will benefit from follow up [place] at discharge.  PT - End of Session Activity Tolerance: Tolerates 30+ min activity with multiple rests Endurance Deficit: Yes Endurance Deficit Description: requires rest breaks following all mobility activities PT Assessment Rehab Potential (ACUTE/IP ONLY): Good PT Patient demonstrates impairments in the following area(s): Balance;Perception;Safety;Sensory;Endurance;Motor PT Transfers Functional Problem(s): Bed Mobility;Bed to Chair;Car;Furniture PT Locomotion Functional Problem(s): Ambulation;Wheelchair Mobility;Stairs PT Plan PT Intensity: Minimum of 1-2 x/day ,45 to 90 minutes PT Frequency: 5 out of 7 days PT Duration Estimated Length of Stay: 14-17 days PT Treatment/Interventions: Ambulation/gait training;Cognitive remediation/compensation;Community reintegration;Balance/vestibular training;Disease management/prevention;Discharge planning;DME/adaptive equipment instruction;Neuromuscular re-education;Functional mobility training;Patient/family education;Stair training;Therapeutic Activities;Therapeutic Exercise;UE/LE Coordination activities;UE/LE Strength taining/ROM;Wheelchair propulsion/positioning;Visual/perceptual remediation/compensation PT Transfers Anticipated Outcome(s): S PT Locomotion Anticipated Outcome(s): S in home with LRAD PT Recommendation Follow Up Recommendations: Home health PT Patient destination: Home Equipment Recommended: To be determined Equipment  Details: likely none needed, has RW and quad cane  Skilled Therapeutic Intervention Tx 1: Pt received supine in bed, denies pain and agreeable to treatment. Initial PT evaluation performed and completed with modA overall as described below. Pt educated in rehab process, goals, ELOS, safety plan; agreeable to all the above. Pt requests to use restroom at end of session; stand pivot to toilet with maxA for sit >stand d/t fatigue, and min guard for stand pivot. Remained seated on toilet with handoff to NT.  Tx 2: Pt received asleep in bed, difficult to arouse but ultimately agreeable to treatment. Supine>sit with S and increased time, cues for upright posture once EOB due to R lateral lean onto RUE on bedrail. Sit>stand modA for anterior weight shift and powering up to stand. Stand pivot minA to w/c, facilitation for R weight shift to improve LLE foot clearance. Stand pivot transfer w/c <>mat table with assist/cueing as above. Sit <>stand x 6 reps from edge of mat table with faded assist from modA to min guard. Standing balance activity with RUE reaching in R anterolateral direction for carryover into gait to A with LLE foot clearance and postural control. Second reaching task consisted of R lateral reaching and bringing object across midline in L lower diagonal direction. Daughter present during session; discussed rehab plan with daughter who is in agreement. Returned to room in w/c totalA; remained seated in w/c with family present and all needs in reach at completion of session.   PT Evaluation Precautions/Restrictions Precautions Precautions: Fall Restrictions Weight Bearing Restrictions: No General Chart Reviewed: Yes Response to Previous Treatment: Patient reporting fatigue but able to participate. Family/Caregiver Present: No  Pain Pain Assessment Pain Assessment: No/denies pain Home Living/Prior Functioning Home Living Available Help at Discharge: Family;Available 24 hours/day Type of  Home: House Home Access: Stairs to enter [employer] of Steps: 1 Entrance Stairs-Rails: None Home Layout: One level Bathroom Shower/Tub: Chiropodist: Standard Additional Comments: Pt lives with daughter, granddaughter, and 83mo twin great grandchildren. Anticipate moving to new single story home in two weeks.   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 Unable to ascertain frequency of community outings.  Vision/Perception  Vision - Assessment Additional Comments: Appears to have L visual field deficits. Further assessment warranted.   Cognition Overall Cognitive Status: Impaired/Different from baseline Arousal/Alertness: Awake/alert Attention: Sustained Focused Attention: Appears intact Sustained Attention: Appears intact Memory: Appears intact Awareness: Appears intact Problem Solving: Appears intact Executive Function: Sequencing Sequencing: Impaired Sequencing Impairment: Verbal basic Safety/Judgment: Appears intact Sensation Sensation Light Touch: Appears Intact Hot/Cold: Appears Intact Proprioception: Appears Intact Coordination Gross Motor Movements are Fluid and Coordinated: No Fine Motor Movements are Fluid and Coordinated: No Coordination and Movement Description: Slight tremor noted in RUE. Delayed initiation of movement from sit to stand.  Motor  Motor Motor: Abnormal postural alignment and control;Hemiplegia;Abnormal tone Motor - Skilled Clinical Observations: inability to find/maintain midline with tendency towards L side, L hemiparesis UE>LE, delayed initation and poor power production  Mobility Bed Mobility Bed Mobility:  Supine to Sit;Sit to Supine Supine to Sit: 5: Supervision;With rails Supine to Sit Details (indicate cue type and reason): cues for technique, orientation to midline once sitting Sit to Supine: 5: Supervision Sit to Supine - Details: Verbal cues for technique;Verbal cues for precautions/safety Transfers Transfers: Yes Stand Pivot Transfers: 3: Mod assist Stand Pivot Transfer Details: Manual facilitation for weight shifting;Verbal cues for technique;Verbal cues for sequencing;Tactile cues for posture;Tactile cues for weight shifting Stand Pivot Transfer Details (indicate cue type and reason): facilitation for weight shifting to R side to improve LLE clearance/progression, cues for upright posture Locomotion  Ambulation Ambulation: Yes Ambulation/Gait Assistance: 3: Mod assist Ambulation Distance (Feet): 50 Feet Assistive device: 1 person hand held assist Ambulation/Gait Assistance Details: Manual facilitation for weight shifting;Verbal cues for technique;Verbal cues for precautions/safety;Verbal cues for sequencing;Tactile cues for posture;Tactile cues for weight shifting Ambulation/Gait Assistance Details: cues for R weight shift to improve LLE foot clearance, upright posture, trunk extension and forward gaze Gait Gait: Yes Gait Pattern: Impaired Gait Pattern: Poor foot clearance - left;Narrow base of support;Decreased step length - left;Decreased stance time - right;Decreased stride length;Decreased weight shift to right;Trendelenburg Gait velocity: significantly decreased for age/gender norms Stairs / Additional Locomotion Stairs: Yes Stair Management Technique: Two rails;Step to pattern;Forwards;Backwards (forward ascent, backwards descent, inefficient use of LUE on rail requires assist) Number of Stairs: 6 Height of Stairs: 3 Wheelchair Mobility Wheelchair Mobility: Yes Wheelchair Assistance: 5: Supervision Wheelchair Assistance Details: Verbal cues for technique;Verbal cues for  sequencing;Verbal cues for precautions/safety Wheelchair Propulsion: Right upper extremity;Both lower extermities Wheelchair Parts Management: Needs assistance Distance: 150'  Trunk/Postural Assessment  Cervical Assessment Cervical Assessment: Within Functional Limits Thoracic Assessment Thoracic Assessment: Exceptions to [facility] (L lateral trunk elongated, R trunk shortened) Lumbar Assessment Lumbar Assessment: Exceptions to [facility] (posterior pelvic tilt) Postural Control Postural Control: Deficits on evaluation (decreased weight shift to R side, impaired stepping/righting reactions in sitting/standing)  Balance Balance Balance Assessed: Yes Standardized Balance Assessment Standardized Balance Assessment: Timed Up and Go Test Timed Up and Go Test TUG: Normal TUG Normal TUG (seconds): 80 (HHA) Static Sitting Balance Static Sitting - Balance Support: No upper extremity supported;Feet supported Static Sitting - Level of Assistance: 5: Stand by assistance Dynamic Sitting Balance Dynamic Sitting - Balance Support: Feet supported;No upper extremity supported;During functional activity Dynamic Sitting - Level of Assistance: 4: Min assist  Dynamic Sitting - Balance Activities: Forward lean/weight shifting;Reaching for objects;Reaching across midline Sitting balance - Comments: posterior LOB with prolonged sitting or dynamic task such as putting shoes on Static Standing Balance Static Standing - Balance Support: Right upper extremity supported Static Standing - Level of Assistance: 4: Min assist Static Standing - Comment/# of Minutes: improved to min guard with repetition, cues for upright posture Dynamic Standing Balance Dynamic Standing - Balance Support: Right upper extremity supported;During functional activity Dynamic Standing - Level of Assistance: 3: Mod assist Dynamic Standing - Balance Activities: Forward lean/weight shifting;Reaching for objects Extremity Assessment  RUE  Assessment RUE Assessment: Within Functional Limits LUE Assessment LUE Assessment: Exceptions to WFL LUE AROM (degrees) Overall AROM Left Upper Extremity: Deficits RLE Assessment RLE Assessment: Within Functional Limits LLE Assessment LLE Assessment: Exceptions to [facility] (mild strength deficits, grossly 4/5 throughout)   See Function Navigator for Current Functional Status.   Refer to Care Plan for Long Term Goals  Recommendations for other services: None  Discharge Criteria: Patient will be discharged from PT if patient refuses treatment 3 consecutive times without medical reason, if treatment goals not met, if there is a change in medical status, if patient makes no progress towards goals or if patient is discharged from hospital.  The above assessment, treatment plan, treatment alternatives and goals were discussed and mutually agreed upon: by patient and by family  Luberta Mutter 10/06/2015, 12:24 PM

## 2015-10-06 NOTE — Evaluation (Addendum)
Speech Language Pathology Assessment and Plan  Patient Details  Name: Karen [name] MRN: [id number] Date of Birth: 12/31/1922  SLP Diagnosis: Dysphagia;Dysarthria  Rehab Potential: Good ELOS: 7-10 days for SLP     Today's Date: 10/06/2015 SLP Individual Time: 8657-8469 SLP Individual Time Calculation (min): 60 min   Problem List:  Patient Active Problem List   Diagnosis Date Noted  . HOH (hard of hearing)   . Acute ischemic stroke ([place]) 10/04/2015  . Hyperlipidemia 10/04/2015  . Cerebrovascular accident (CVA) due to occlusion of cerebral artery ([place])   . Tachycardia   . Dysphagia, post-stroke   . Stroke ([place]) 10/03/2015  . CVA (cerebral infarction) 10/03/2015  . Hypotension due to drugs   . HLD (hyperlipidemia)   . Legally blind   . Notalgia   . Fall 04/02/2015  . Depression 01/26/2015  . Essential hypertension   . Aortic valve disorder 11/26/2009  . NEOPLASM, MALIGNANT, COLON, CECUM 10/30/2008  . ANEMIA 10/21/2008  . PERSONAL HX COLONIC POLYPS 10/21/2008  . GALLSTONES 10/09/2008  . Backache 06/27/2007  . OCCLUSION, CENTRAL RETINAL ARTERY 08/14/2006  . Glaucoma 08/14/2006  . Diverticulosis of colon (without mention of hemorrhage) 08/14/2006  . SYNCOPE, HX OF 08/14/2006   Past Medical History:  Past Medical History  Diagnosis Date  . ANEMIA 10/21/2008  . AORTIC INSUFFICIENCY 11/26/2009  . BACK PAIN 06/27/2007  . COLON CANCER, PERSONAL HX 02/25/2010  . Diverticulosis of colon (without mention of hemorrhage) 08/14/2006  . GALLSTONES 10/09/2008  . GLAUCOMA NOS 08/14/2006  . HYPERTENSION 08/14/2006  . HYPOKALEMIA, HX OF 11/26/2009  . MELENA, HX OF 10/12/2008  . NEOPLASM, MALIGNANT, COLON, CECUM 10/30/2008  . OCCLUSION, CENTRAL RETINAL ARTERY 08/14/2006  . PERSONAL HX COLONIC POLYPS 10/21/2008  . SYNCOPE, HX OF 08/14/2006  . URI 09/07/2008  . H/O: stroke     behind right eye   Past Surgical History:  Past Surgical History  Procedure Laterality Date  . Appendectomy    .  Cataract extraction      bilateral  . Dilation and curettage of uterus    . Tonsillectomy    . Colon surgery      11/20/2008,gallbladder removed  . Ankle fracture surgery  2011    right    Assessment / Plan / Recommendation Clinical Impression  Karen [name] is a 82 y.o. right handed female with history of Colorectal cancer diagnosed 2010 status post resection followed by Dr. Alen Blew, hypertension, blindness to right eye, hard of hearing. Cranial CT scan negative. MRI of the brain showed acute lacunar type infarct tracking from the posterior right corona radiata to the posterior lentiform nuclei.  Dysphagia #2 thin liquid diet .Patient was admitted for a comprehensive rehabilitation program on 10/05/2015.  SLP evaluation was completed on 10/06/2015 with the following results:  Pt presents with a mild oral dysphagia characterized by left sided labial and lingual weakness which impacts oral containment, manipulation, and transit of solid boluses resulting in mild left sided buccal residue remaining in the oral cavity post swallow.  No overt s/s of aspiration with thin liquids or solids.  For now, recommend that pt remain on dys 2, thin liquids with intermittent supervision for use of swallowing precautions. Pt would benefit from a trial meal tray of advanced textures at next available appointment prior to advancement to determine toleration of dys 3 textures over an entire meal.  Pt also presents with a mild dysarthria due to the abovementioned oral motor weakness.  Pt is overall intelligible in  conversations within a known context but would benefit from brief education and carryover of compensatory intelligibility strategies to improve articulatory precision.  Cognitive function appears WFL per results of targeted subtests of [employer] Blind; however, standardized score was unable to be obtained due to omission of serial subtraction subtest due to pt reporting she'd "never been good with arithmetic." Pt would  benefit from ST follow up 3x/week while inpatient in order to maximize functional independence and reduce burden of care prior to discharge.   Do not anticipate ST needs at next level of care due to pt's high level of function and level of support available at home.    Skilled Therapeutic Interventions          Cognitive-linguistic and bedside swallow evaluation completed with results and recommendations reviewed with patient     SLP Assessment  Patient will need skilled Speech Lanaguage Pathology Services during CIR admission    Recommendations  SLP Diet Recommendations: Dysphagia 2 (Fine chop);Thin Liquid Administration via: Cup;Straw Medication Administration: Whole meds with liquid Supervision: Patient able to self feed;Intermittent supervision to cue for compensatory strategies Compensations: Minimize environmental distractions;Slow rate;Small sips/bites;Lingual sweep for clearance of pocketing Postural Changes and/or Swallow Maneuvers: Seated upright 90 degrees Oral Care Recommendations: Oral care BID Patient destination: Home Follow up Recommendations: Other (comment) (TBD) Equipment Recommended: None recommended by SLP    SLP Frequency 1 to 3 out of 7 days   SLP Duration  SLP Intensity  SLP Treatment/Interventions 7-10 days for SLP   Minumum of 1-2 x/day, 30 to 90 minutes  Cueing hierarchy;Functional tasks;Internal/external aids;Patient/family education;Dysphagia/aspiration precaution training    Pain Pain Assessment Pain Assessment: No/denies pain  Prior Functioning Cognitive/Linguistic Baseline: Within functional limits Type of Home: House  Lives With: Family;Daughter Available Help at Discharge: Family;Available 24 hours/day Education: 11th grade  Vocation: Retired  Function:  Eating Eating   Modified Consistency Diet: Yes Eating Assist Level: More than reasonable amount of time;Set up assist for   Eating Set Up Assist For: Opening containers        Cognition Comprehension Comprehension assist level: Follows basic conversation/direction with no assist  Expression   Expression assist level: Expresses basic needs/ideas: With extra time/assistive device  Social Interaction Social Interaction assist level: Interacts appropriately 75 - 89% of the time - Needs redirection for appropriate language or to initiate interaction.  Problem Solving Problem solving assist level: Solves basic 75 - 89% of the time/requires cueing 10 - 24% of the time  Memory Memory assist level: Recognizes or recalls 90% of the time/requires cueing < 10% of the time   Short Term Goals: Week 1: SLP Short Term Goal 1 (Week 1): STG=LTG due to anticipated short duration of ST services.    Refer to Care Plan for Long Term Goals  Recommendations for other services: None  Discharge Criteria: Patient will be discharged from SLP if patient refuses treatment 3 consecutive times without medical reason, if treatment goals not met, if there is a change in medical status, if patient makes no progress towards goals or if patient is discharged from hospital.  The above assessment, treatment plan, treatment alternatives and goals were discussed and mutually agreed upon: by patient  Emilio Math 10/06/2015, 3:58 PM

## 2015-10-07 ENCOUNTER — Inpatient Hospital Stay (HOSPITAL_COMMUNITY): Payer: Medicare Other | Admitting: Speech Pathology

## 2015-10-07 ENCOUNTER — Inpatient Hospital Stay (HOSPITAL_COMMUNITY): Payer: Medicare Other | Admitting: Occupational Therapy

## 2015-10-07 ENCOUNTER — Inpatient Hospital Stay (HOSPITAL_COMMUNITY): Payer: Medicare Other | Admitting: Physical Therapy

## 2015-10-07 DIAGNOSIS — G8194 Hemiplegia, unspecified affecting left nondominant side: Secondary | ICD-10-CM

## 2015-10-07 MED ORDER — CLONIDINE HCL 0.1 MG PO TABS
0.1000 mg | ORAL_TABLET | Freq: Four times a day (QID) | ORAL | Status: DC | PRN
  Administered 2015-10-10: 0.1 mg via ORAL
  Filled 2015-10-07 (×3): qty 1

## 2015-10-07 NOTE — Progress Notes (Signed)
[facility] Individual Statement of Services  Patient Name:  Karen [name]  Date:  10/07/2015  Welcome to the [facility].  Our goal is to provide you with an individualized program based on your diagnosis and situation, designed to meet your specific needs.  With this comprehensive rehabilitation program, you will be expected to participate in at least 3 hours of rehabilitation therapies Monday-Friday, with modified therapy programming on the weekends.  Your rehabilitation program will include the following services:  Physical Therapy (PT), Occupational Therapy (OT), Speech Therapy (ST), 24 hour per day rehabilitation nursing, Case Management (Social Worker), Rehabilitation Medicine, Nutrition Services and Pharmacy Services  Weekly team conferences will be held on Wednesdays to discuss your progress.  Your Social Worker will talk with you frequently to get your input and to update you on team discussions.  Team conferences with you and your family in attendance may also be held.  Expected length of stay: 14 to 17 days  Overall anticipated outcome:  Supervision and minimal assistance for bathing, lower body dressing, and stairs  Depending on your progress and recovery, your program may change. Your Social Worker will coordinate services and will keep you informed of any changes. Your Social Worker's name and contact numbers are listed  below.  The following services may also be recommended but are not provided by the [facility] will be made to provide these services after discharge if needed.  Arrangements include referral to agencies that provide these services.  Your insurance has been verified to be:  Medicare Your primary doctor is:  Dr. Bluford Kaufmann  Pertinent information will be  shared with your doctor and your insurance company.  Social Worker:  Alfonse Alpers, LCSW  [phone] or ([phone]  Information discussed with and copy given to patient by: Trey Sailors, 10/07/2015, 10:46 AM

## 2015-10-07 NOTE — Patient Care Conference (Signed)
Inpatient RehabilitationTeam Conference and Plan of Care Update Date: 10/06/2015   Time: 11:05 AM    Patient Name: Karen [name]      Medical Record Number: [id number]  Date of Birth: 1922/05/01 Sex: Female         Room/Bed: 4W17C/4W17C-01 Payor Info: Payor: MEDICARE / Plan: MEDICARE PART A AND B / Product Type: *No Product type* /    Admitting Diagnosis: Rt CVA  Admit Date/Time:  10/05/2015  4:28 PM Admission Comments: No comment available   Primary Diagnosis:  <principal problem not specified> Principal Problem: <principal problem not specified>  Patient Active Problem List   Diagnosis Date Noted  . HOH (hard of hearing)   . Acute ischemic stroke ([place]) 10/04/2015  . Hyperlipidemia 10/04/2015  . Cerebrovascular accident (CVA) due to occlusion of cerebral artery ([place])   . Tachycardia   . Dysphagia, post-stroke   . Stroke ([place]) 10/03/2015  . CVA (cerebral infarction) 10/03/2015  . Hypotension due to drugs   . HLD (hyperlipidemia)   . Legally blind   . Notalgia   . Fall 04/02/2015  . Depression 01/26/2015  . Essential hypertension   . Aortic valve disorder 11/26/2009  . NEOPLASM, MALIGNANT, COLON, CECUM 10/30/2008  . ANEMIA 10/21/2008  . PERSONAL HX COLONIC POLYPS 10/21/2008  . GALLSTONES 10/09/2008  . Backache 06/27/2007  . OCCLUSION, CENTRAL RETINAL ARTERY 08/14/2006  . Glaucoma 08/14/2006  . Diverticulosis of colon (without mention of hemorrhage) 08/14/2006  . SYNCOPE, HX OF 08/14/2006    Expected Discharge Date: Expected Discharge Date: 10/21/15  Team Members Present: Physician leading conference: Dr. Alysia Penna Social Worker Present: Alfonse Alpers, LCSW Nurse Present: Junius Creamer, RN PT Present: Kem Parkinson, PT OT Present: Simonne Come, OT SLP Present: Windell Moulding, SLP PPS Coordinator present : Daiva Nakayama, RN, CRRN     Current Status/Progress Goal Weekly Team Focus  Medical   mild dysarthria, left HP  maintain medical stability  initiate  rehab   Bowel/Bladder   Continent of bladder with urgency patient wears brief PTA,Continent of bowel  Continent of bowel,   Monitor and assist patient with toileting as soon as possible   Swallow/Nutrition/ Hydration   pending ST evaluation          ADL's   Mod A overall.   Min A to S overall.   Improving activity tolerance, ADL retraining, use of AD, LUE NMR, and transfer training.    Mobility   minA transfers, modA transfers and gait with HHA/rail, modA stairs  S bed mobility transfers, S gait with LRAD  dynamic balance, gait training, activity tolerance   Communication   Pending ST evaluation          Safety/Cognition/ Behavioral Observations  Pending ST evaluation          Pain   Pain controled with prn tylenol  Pain at or below a 2 out 10 with min assist  Monitor pain q shift and prn   Skin   Left hip bruise, bottom red  with allevyn dressing   Remain free from further skin breakdown/infection with min assist  Monitor skin q shift and prn    Rehab Goals Patient on target to meet rehab goals: Yes Rehab Goals Revised: none - pt just evaluated on day of conference *See Care Plan and progress notes for long and short-term goals.  Barriers to Discharge: age 82 limt endurance    Possible Resolutions to Barriers:  may need to adjust schedule    Discharge Planning/Teaching  Needs:  Pt to return to her granddaughter's home where she lives and is cared for by her granddaughter and dtr.  Pt's family can come for family education closer to d/c.   Team Discussion:  Pt has no major medical issues, other than recent stroke.  Pt walked 44' with PT using handrail and handhold.  She has good strength, but PT will continue to work on balance/mid-line issues.  Pt is a little unsteady with transfers with OT and she is min assist currently.  ST was still to eval.  Pt has been incontinent for years, per RN, and wears briefs at home.  Pt has a lot of DME at home - hospital bed, shower  bench/chair, cane, walker.  Revisions to Treatment Plan:  none   Continued Need for Acute Rehabilitation Level of Care: The patient requires daily medical management by a physician with specialized training in physical medicine and rehabilitation for the following conditions: Daily direction of a multidisciplinary physical rehabilitation program to ensure safe treatment while eliciting the highest outcome that is of practical value to the patient.: Yes Daily medical management of patient stability for increased activity during participation in an intensive rehabilitation regime.: Yes Daily analysis of laboratory values and/or radiology reports with any subsequent need for medication adjustment of medical intervention for : Neurological problems  Rodderick Holtzer, Silvestre Mesi 10/07/2015, 10:34 AM

## 2015-10-07 NOTE — Progress Notes (Signed)
Dr. Read Drivers notified of patient's elevated blood pressures.

## 2015-10-07 NOTE — Progress Notes (Signed)
82 y.o. right handed female with history of Colorectal cancer diagnosed 2010 status post resection followed by Dr. Shadad, hypertension, , blindness to left eye, hard of hearing. Patient lives with granddaughter and 14-month-old twins. Used an occasional cane prior to admission. Granddaughter works out of the house. Presented 10/03/2015 left sided weakness and slurred speech. Cranial CT scan negative. MRI of the brain showed acute lacunar type infarct tracking from the posterior right corona radiata to the posterior lentiform nuclei. CT angiogram of head and neck no acute findings. Echocardiogram with ejection fraction 65% grade 1 diastolic dysfunction. Patient did not receive TPA. Neurology consulted presently on Plavix for CVA prophylaxis. Subcutaneous Lovenox for DVT prophylaxis. Dysphagia #2 thin liquid diet  Subjective/Complaints: Eating breakfast coughing and pocketing ROS- denies CP, SOB, N/V/D Objective: Vital Signs: Blood pressure 186/84, pulse 94, temperature 98.7 F (37.1 C), temperature source Oral, resp. rate 17, height 5' 7" (1.702 m), weight 69.627 kg (153 lb 8 oz), SpO2 95 %. No results found. Results for orders placed or performed during the hospital encounter of 10/05/15 (from the past 72 hour(s))  CBC     Status: None   Collection Time: 10/05/15  7:28 PM  Result Value Ref Range   WBC 8.2 4.0 - 10.5 K/uL   RBC 4.37 3.87 - 5.11 MIL/uL   Hemoglobin 12.7 12.0 - 15.0 g/dL   HCT 38.9 36.0 - 46.0 %   MCV 89.0 78.0 - 100.0 fL   MCH 29.1 26.0 - 34.0 pg   MCHC 32.6 30.0 - 36.0 g/dL   RDW 14.4 11.5 - 15.5 %   Platelets 220 150 - 400 K/uL  Creatinine, serum     Status: Abnormal   Collection Time: 10/05/15  7:28 PM  Result Value Ref Range   Creatinine, Ser 0.88 0.44 - 1.00 mg/dL   GFR calc non Af Amer 56 (L) >60 mL/min   GFR calc Af Amer >60 >60 mL/min    Comment: (NOTE) The eGFR has been calculated using the CKD EPI equation. This calculation has not been validated in all  clinical situations. eGFR's persistently <60 mL/min signify possible Chronic Kidney Disease.   CBC WITH DIFFERENTIAL     Status: None   Collection Time: 10/06/15  7:21 AM  Result Value Ref Range   WBC 7.7 4.0 - 10.5 K/uL   RBC 4.36 3.87 - 5.11 MIL/uL   Hemoglobin 12.7 12.0 - 15.0 g/dL   HCT 38.2 36.0 - 46.0 %   MCV 87.6 78.0 - 100.0 fL   MCH 29.1 26.0 - 34.0 pg   MCHC 33.2 30.0 - 36.0 g/dL   RDW 14.3 11.5 - 15.5 %   Platelets 220 150 - 400 K/uL   Neutrophils Relative % 60 %   Neutro Abs 4.6 1.7 - 7.7 K/uL   Lymphocytes Relative 27 %   Lymphs Abs 2.1 0.7 - 4.0 K/uL   Monocytes Relative 10 %   Monocytes Absolute 0.8 0.1 - 1.0 K/uL   Eosinophils Relative 3 %   Eosinophils Absolute 0.2 0.0 - 0.7 K/uL   Basophils Relative 0 %   Basophils Absolute 0.0 0.0 - 0.1 K/uL  Comprehensive metabolic panel     Status: Abnormal   Collection Time: 10/06/15  7:21 AM  Result Value Ref Range   Sodium 138 135 - 145 mmol/L   Potassium 3.5 3.5 - 5.1 mmol/L   Chloride 105 101 - 111 mmol/L   CO2 25 22 - 32 mmol/L   Glucose, Bld 124 (H)   65 - 99 mg/dL   BUN 10 6 - 20 mg/dL   Creatinine, Ser 0.59 0.44 - 1.00 mg/dL   Calcium 8.9 8.9 - 10.3 mg/dL   Total Protein 6.8 6.5 - 8.1 g/dL   Albumin 3.5 3.5 - 5.0 g/dL   AST 28 15 - 41 U/L   ALT 13 (L) 14 - 54 U/L   Alkaline Phosphatase 55 38 - 126 U/L   Total Bilirubin 0.6 0.3 - 1.2 mg/dL   GFR calc non Af Amer >60 >60 mL/min   GFR calc Af Amer >60 >60 mL/min    Comment: (NOTE) The eGFR has been calculated using the CKD EPI equation. This calculation has not been validated in all clinical situations. eGFR's persistently <60 mL/min signify possible Chronic Kidney Disease.    Anion gap 8 5 - 15     HEENT: Left eye deviated laterally and upward, no vision Cardio: RRR and no murmur Resp: CTA B/L and unlabored GI: BS positive and NT, ND Extremity:  Pulses positive and No Edema Skin:   Other erythema around IV site Neuro: Alert/Oriented, Normal  Sensory, Abnormal Motor 2- in left delt, bi, tri, 0/5 grip and Other left neglect vs visual loss L eye Musc/Skelno pain with UE or LE ROMOther no pain with UE or LE ROM Gen NAD   Assessment/Plan: 1. Functional deficits secondary to  Left-sided weakness with dysarthria/dysphagia secondary to right basal ganglia corona radiata infarct which require 3+ hours per day of interdisciplinary therapy in a comprehensive inpatient rehab setting. Physiatrist is providing close team supervision and 24 hour management of active medical problems listed below. Physiatrist and rehab team continue to assess barriers to discharge/monitor patient progress toward functional and medical goals. FIM: Function - Bathing Position: Shower Body parts bathed by patient: Right arm, Left arm, Chest, Abdomen, Front perineal area, Right upper leg, Left upper leg Body parts bathed by helper: Buttocks, Back, Left lower leg, Right lower leg Assist Level:  (Mod A)  Function- Upper Body Dressing/Undressing What is the patient wearing?: Pull over shirt/dress Pull over shirt/dress - Perfomed by patient: Thread/unthread right sleeve Pull over shirt/dress - Perfomed by helper: Thread/unthread left sleeve, Put head through opening, Pull shirt over trunk Assist Level:  (Max A) Function - Lower Body Dressing/Undressing What is the patient wearing?: Non-skid slipper socks, Pants Position: Sitting EOB Pants- Performed by helper: Thread/unthread right pants leg, Thread/unthread left pants leg, Pull pants up/down Non-skid slipper socks- Performed by helper: Don/doff right sock, Don/doff left sock Assist for footwear: Maximal assist Assist for lower body dressing:  (Total A)  Function - Toileting Toileting steps completed by patient: Adjust clothing prior to toileting Toileting steps completed by helper: Performs perineal hygiene, Adjust clothing after toileting Toileting Assistive Devices: Grab bar or rail Assist level: Touching or  steadying assistance (Pt.75%)  Function - Toilet Transfers Toilet transfer assistive device: Grab bar Assist level to toilet: Maximal assist (Pt 25 - 49%/lift and lower) Assist level from toilet: Maximal assist (Pt 25 - 49%/lift and lower)  Function - Chair/bed transfer Chair/bed transfer assist level: Moderate assist (Pt 50 - 74%/lift or lower) Chair/bed transfer assistive device: Armrests Chair/bed transfer details: Tactile cues for weight shifting, Tactile cues for posture, Verbal cues for precautions/safety, Verbal cues for technique, Verbal cues for sequencing  Function - Locomotion: Wheelchair Will patient use wheelchair at discharge?: No Type: Manual Max wheelchair distance: 150 Assist Level: Supervision or verbal cues Assist Level: Supervision or verbal cues Assist Level: Supervision or verbal   cues Turns around,maneuvers to table,bed, and toilet,negotiates 3% grade,maneuvers on rugs and over doorsills: No Function - Locomotion: Ambulation Assistive device: Hand held assist, Rail in hallway Max distance: 50 Assist level: Moderate assist (Pt 50 - 74%) Assist level: Moderate assist (Pt 50 - 74%) Assist level: Moderate assist (Pt 50 - 74%) Walk 150 feet activity did not occur: Safety/medical concerns Walk 10 feet on uneven surfaces activity did not occur: Safety/medical concerns  Function - Comprehension Comprehension: Auditory Comprehension assistive device: Hearing aids Comprehension assist level: Follows basic conversation/direction with no assist  Function - Expression Expression: Verbal Expression assist level: Expresses basic needs/ideas: With no assist  Function - Social Interaction Social Interaction assist level: Interacts appropriately 75 - 89% of the time - Needs redirection for appropriate language or to initiate interaction.  Function - Problem Solving Problem solving assist level: Solves basic 75 - 89% of the time/requires cueing 10 - 24% of the  time  Function - Memory Memory assist level: Recognizes or recalls 90% of the time/requires cueing < 10% of the time Patient normally able to recall (first 3 days only): Current season, Location of own room, That he or she is in a hospital  Medical Problem List and Plan: 1.  Left-sided weakness with dysarthria/dysphagia secondary to right basal ganglia corona radiata infarct- not tolerating therapy schedule, slept through dinner yesterday.  Will make 15/7 for now 2.  DVT Prophylaxis/Anticoagulation: Subcutaneous Lovenox. Monitor platelet counts or any signs of bleeding, plt 220K 3. Pain Management: Tylenol as needed 4. Dysphagia. Dysphagia #2 thin liquid diet. Severe pocketing needs cues to clear , coughing will write for supervision with meals 5. Neuropsych: This patient is capable of making decisions on her own behalf. 6. Skin/Wound Care: Routine skin checks 7. Fluids/Electrolytes/Nutrition: Routine I&O's BMET normal 7/12 8. Hypertension. Permissive hypertension. Monitor with increased mobility. Patient on Tenormin 50 mg daily prior to admission. 9. Mood. Resume Lexapro 10 mg daily 10. Hard of hearing. 11. Decreased vision right eye. 12. Hyperlipidemia. Pravachol 13. History of colorectal cancer diagnosed 2010. Status post resection. Presently in remission. Followed by Dr. Shadad   LOS (Days) 2 A FACE TO FACE EVALUATION WAS PERFORMED  , E 10/07/2015, 8:37 AM    

## 2015-10-07 NOTE — Progress Notes (Signed)
Speech Language Pathology Daily Session Note  Patient Details  Name: Karen [name] MRN: [id number] Date of Birth: [birth year]  Today's Date: 10/07/2015 SLP Individual Time: 1105-1208 SLP Individual Time Calculation (min): 63 min  Short Term Goals: Week 1: SLP Short Term Goal 1 (Week 1): STG=LTG due to anticipated short duration of ST services.    Skilled Therapeutic Interventions:  Pt was seen for skilled ST targeting dysphagia goals.  Pt more fatigued today in comparison to yesterday's evaluation.  RN and pt both report elevated blood pressure limiting participation in therapies.  Attempted to sit pt at edge of bed to maximize attention and alertness for swallowing safety; however, due to fatigue pt was unable to support herself while edge of bed.  Pt was repositioned with head of bed elevated for comfort during meal.  Alertness appeared to improve after repositioning.  Per report, pt with increased coughing and pocketing during breakfast meal today. SLP completed skilled observations during a trial meal tray of dys 3 textures to continue working towards diet progression.   Pt consumed advanced textures with min verbal cues to clear solids from the oral cavity via liquid wash.   Pt also with coughing on mixed consistencies although solids and liquids in isolation were tolerated well.  Suspect rate and portion control to be contributing factors to s/s of aspiration.  Do not recommend advancement at this time given worsening s/s of dysphagia in comparison to initial evaluation.  Also recommend full supervision during meals for safety.  Pt left in bed with call bell within reach.  Bed alarm set.  Continue per current plan of care.    Function:  Eating Eating   Modified Consistency Diet: Yes Eating Assist Level: More than reasonable amount of time;Set up assist for;Supervision or verbal cues   Eating Set Up Assist For: Opening containers       Cognition Comprehension Comprehension assist  level: Follows basic conversation/direction with extra time/assistive device  Expression   Expression assist level: Expresses basic needs/ideas: With extra time/assistive device  Social Interaction Social Interaction assist level: Interacts appropriately 90% of the time - Needs monitoring or encouragement for participation or interaction.  Problem Solving Problem solving assist level: Solves basic 75 - 89% of the time/requires cueing 10 - 24% of the time  Memory Memory assist level: Recognizes or recalls 75 - 89% of the time/requires cueing 10 - 24% of the time    Pain Pain Assessment Pain Assessment: No/denies pain  Therapy/Group: Individual Therapy  Karl Erway, Selinda Orion 10/07/2015, 2:43 PM

## 2015-10-07 NOTE — Progress Notes (Signed)
Physical Therapy Session Note  Patient Details  Name: Karen [name] MRN: [id number] Date of Birth: [birth year]  Today's Date: 10/07/2015 PT Individual Time: 1000-1020 PT Individual Time Calculation (min): 20 min   Short Term Goals: Week 1:  PT Short Term Goal 1 (Week 1): Pt will perform stand pivot transfer with consistent minA PT Short Term Goal 2 (Week 1): Pt will perform gait with consistent minA x100' PT Short Term Goal 3 (Week 1): Pt will perform ascent/descent 8 3-inch stairs with minA for strengthening and NMR PT Short Term Goal 4 (Week 1): Pt will perform dynamic standing balance x5 min with min guard while engaged in functional BUE task  Skilled Therapeutic Interventions/Progress Updates:    Pt received supine in bed, c/o fatigue but no pain and agreeable to attempt treatment. Supine>sit with maxA due to decreased initiation, repetitive LOB posteriorly and fatigue. Seated on EOB, required max multimodal cues for maintain midline posture to prevent LOB posteriorly. Pt attempted to don B socks in bed and on EOB however unable and requests assist; therapist dons shoes/socks [place]. Sit <>stand x3 trials with poor power production in BLEs and decreased initiation. Ultimately performed squat pivot transfer maxA to w/c. Vitals assessed as below, with significantly elevated BP at rest. RN and PA alerted. Remaining therapy session held at this time due to elevated vitals and significant fatigue. Pt remained seated in w/c at completion of session per pt request, all needs in reach.   Therapy Documentation Precautions:  Precautions Precautions: Fall Restrictions Weight Bearing Restrictions: No General: PT Amount of Missed Time (min): 40 Minutes PT Missed Treatment Reason: Patient fatigue;Other (Comment) (significantly elevated BP) Vital Signs: Therapy Vitals Pulse Rate: (!) 106 BP: (!) 205/89 mmHg Patient Position (if appropriate): Sitting Pain: Pain Assessment Pain Assessment:  No/denies pain   See Function Navigator for Current Functional Status.   Therapy/Group: Individual Therapy  Luberta Mutter 10/07/2015, 10:34 AM

## 2015-10-07 NOTE — Progress Notes (Signed)
Social Work Patient ID: [name], female   DOB: 1923-02-09, 82 y.o.   MRN: [id number]   Lynnda Child, LCSW Social Worker Signed  Patient Care Conference 10/07/2015 10:34 AM    Expand All Collapse All   Inpatient RehabilitationTeam Conference and Plan of Care Update Date: 10/06/2015   Time: 11:05 AM     Patient Name: Karen [name]       Medical Record Number: [id number]  Date of Birth: [birth year] Sex: Female         Room/Bed: 4W17C/4W17C-01 Payor Info: Payor: MEDICARE / Plan: MEDICARE PART A AND B / Product Type: *No Product type* /    Admitting Diagnosis: Rt CVA   Admit Date/Time:  10/05/2015  4:28 PM Admission Comments: No comment available   Primary Diagnosis:  <principal problem not specified> Principal Problem: <principal problem not specified>    Patient Active Problem List     Diagnosis  Date Noted   .  HOH (hard of hearing)     .  Acute ischemic stroke ([place])  10/04/2015   .  Hyperlipidemia  10/04/2015   .  Cerebrovascular accident (CVA) due to occlusion of cerebral artery ([place])     .  Tachycardia     .  Dysphagia, post-stroke     .  Stroke ([place])  10/03/2015   .  CVA (cerebral infarction)  10/03/2015   .  Hypotension due to drugs     .  HLD (hyperlipidemia)     .  Legally blind     .  Notalgia     .  Fall  04/02/2015   .  Depression  01/26/2015   .  Essential hypertension     .  Aortic valve disorder  11/26/2009   .  NEOPLASM, MALIGNANT, COLON, CECUM  10/30/2008   .  ANEMIA  10/21/2008   .  PERSONAL HX COLONIC POLYPS  10/21/2008   .  GALLSTONES  10/09/2008   .  Backache  06/27/2007   .  OCCLUSION, CENTRAL RETINAL ARTERY  08/14/2006   .  Glaucoma  08/14/2006   .  Diverticulosis of colon (without mention of hemorrhage)  08/14/2006   .  SYNCOPE, HX OF  08/14/2006     Expected Discharge Date: Expected Discharge Date: 10/21/15  Team Members Present: Physician leading conference: Dr. Alysia Penna Social Worker Present: Alfonse Alpers, LCSW Nurse  Present: Junius Creamer, RN PT Present: Kem Parkinson, PT OT Present: Simonne Come, OT SLP Present: Windell Moulding, SLP PPS Coordinator present : Daiva Nakayama, RN, CRRN        Current Status/Progress  Goal  Weekly Team Focus   Medical     mild dysarthria, left HP  maintain medical stability  initiate rehab   Bowel/Bladder     Continent of bladder with urgency patient wears brief PTA,Continent of bowel  Continent of bowel,   Monitor and assist patient with toileting as soon as possible    Swallow/Nutrition/ Hydration     pending ST evaluation          ADL's     Mod A overall.   Min A to S overall.   Improving activity tolerance, ADL retraining, use of AD, LUE NMR, and transfer training.    Mobility     minA transfers, modA transfers and gait with HHA/rail, modA stairs  S bed mobility transfers, S gait with LRAD  dynamic balance, gait training, activity tolerance    Communication     Pending ST  evaluation          Safety/Cognition/ Behavioral Observations    Pending ST evaluation          Pain     Pain controled with prn tylenol  Pain at or below a 2 out 10 with min assist   Monitor pain q shift and prn   Skin     Left hip bruise, bottom red  with allevyn dressing   Remain free from further skin breakdown/infection with min assist   Monitor skin q shift and prn    Rehab Goals Patient on target to meet rehab goals: Yes Rehab Goals Revised: none - pt just evaluated on day of conference *See Care Plan and progress notes for long and short-term goals.    Barriers to Discharge:  age 25 limt endurance     Possible Resolutions to Barriers:   may need to adjust schedule     Discharge Planning/Teaching Needs:   Pt to return to her granddaughter's home where she lives and is cared for by her granddaughter and dtr.  Pt's family can come for family education closer to d/c.    Team Discussion:    Pt has no major medical issues, other than recent stroke.  Pt walked 96' with PT using  handrail and handhold.  She has good strength, but PT will continue to work on balance/mid-line issues.  Pt is a little unsteady with transfers with OT and she is min assist currently.  ST was still to eval.  Pt has been incontinent for years, per RN, and wears briefs at home.  Pt has a lot of DME at home - hospital bed, shower bench/chair, cane, walker.   Revisions to Treatment Plan:    none    Continued Need for Acute Rehabilitation Level of Care: The patient requires daily medical management by a physician with specialized training in physical medicine and rehabilitation for the following conditions: Daily direction of a multidisciplinary physical rehabilitation program to ensure safe treatment while eliciting the highest outcome that is of practical value to the patient.: Yes Daily medical management of patient stability for increased activity during participation in an intensive rehabilitation regime.: Yes Daily analysis of laboratory values and/or radiology reports with any subsequent need for medication adjustment of medical intervention for : Neurological problems  Trusten Hume, Silvestre Mesi 10/07/2015, 10:34 AM

## 2015-10-07 NOTE — Progress Notes (Signed)
Social Work Patient ID: Karen [name], female   DOB: [birth year], 82 y.o.   MRN: [id number]   CSW met with pt and talked with her dtr, Reola Calkins via telephone, to update them on team conference discussion.  Pt's dtr stated that the timing of pt's d/c will be perfect, as pt and granddaughter are moving into a new one-level home while pt is on Rehab.  She will go home to the new, more accessible home.  Dtr stated that granddaughter cares for pt during the week and she cares for her on the weekends, so pt will have 24/7 care.  Pt had home health coming in prior to admission and CSW was able to figure out it is [facility].  CSW called [facility] to inform them on pt's admission to Rehab and targeted d/c date.  Pt has a lot of DME at home, but will order anything else that is recommended and let [facility] know when pt is d/c'd.  CSW will continue to follow and assist as needed.

## 2015-10-07 NOTE — Progress Notes (Signed)
Occupational Therapy Session Note  Patient Details  Name: Karen [name] MRN: [id number] Date of Birth: 08-07-1922  Today's Date: 10/07/2015 OT Individual Time: [id number] and 1400-1410 OT Individual Time Calculation (min): 35 min and 10 min   Short Term Goals: Week 1:  OT Short Term Goal 1 (Week 1): Pt will maintain dynamic seated balance with S for improved activity engagement.  OT Short Term Goal 2 (Week 1): Pt will complete UB dressing with supervision. OT Short Term Goal 3 (Week 1): Pt will complete LB dressing using AD with supervision. OT Short Term Goal 4 (Week 1): Pt will perform toilet transfers with Min A.   Skilled Therapeutic Interventions/Progress Updates:    1 of 2) Pt asleep at start of session (0730) and unable to waken. Check function navigator for BP reading. Delayed start of session to 0800. Pt more responsive at 0800. Treatment session with focus on feeding and eating skills. Problem solved breakfast tray set up with pt for improved access, environmental set up (lights on) for improved visual scanning, and L food pocketing clearance for safety. Completed dressing in final minutes of session. Noted pt extensor tone while seated at EOB. Min A guard assist to maintain sitting position and for safety. Pt unable to complete hip flexion at RLE this morning so completed LB dressing at Max A at bed level. Left pt in bed with all needs in reach.   2 of 2) Pt asleep at start of session. Roused pt long enough to complete BP readings. Check function navigator. Attempt to engage pt in therapy session with no luck due to pt reported fatigue. Pt care plan modified to 15/7. Continue with current plan of care.    Therapy Documentation Precautions:  Precautions Precautions: Fall Restrictions Weight Bearing Restrictions: No General: General OT Amount of Missed Time: 25 Minutes PT Missed Treatment Reason: Patient fatigue;Other (Comment) (significantly elevated BP) Vital Signs: Therapy  Vitals Pulse Rate: (!) 106 BP: (!) 205/89 mmHg Patient Position (if appropriate): Sitting Pain: Pain Assessment Pain Assessment: No/denies pain  See Function Navigator for Current Functional Status.   Therapy/Group: Individual Therapy  Dierdre Searles 10/07/2015, 11:46 AM

## 2015-10-07 NOTE — Progress Notes (Signed)
Social Work Assessment and Plan  Patient Details  Name: Karen [name] MRN: [id number] Date of Birth: [birth year]  Today's Date: 10/06/2015  Problem List:  Patient Active Problem List   Diagnosis Date Noted  . HOH (hard of hearing)   . Acute ischemic stroke ([place]) 10/04/2015  . Hyperlipidemia 10/04/2015  . Cerebrovascular accident (CVA) due to occlusion of cerebral artery ([place])   . Tachycardia   . Dysphagia, post-stroke   . Stroke ([place]) 10/03/2015  . CVA (cerebral infarction) 10/03/2015  . Hypotension due to drugs   . HLD (hyperlipidemia)   . Legally blind   . Notalgia   . Fall 04/02/2015  . Depression 01/26/2015  . Essential hypertension   . Aortic valve disorder 11/26/2009  . NEOPLASM, MALIGNANT, COLON, CECUM 10/30/2008  . ANEMIA 10/21/2008  . PERSONAL HX COLONIC POLYPS 10/21/2008  . GALLSTONES 10/09/2008  . Backache 06/27/2007  . OCCLUSION, CENTRAL RETINAL ARTERY 08/14/2006  . Glaucoma 08/14/2006  . Diverticulosis of colon (without mention of hemorrhage) 08/14/2006  . SYNCOPE, HX OF 08/14/2006   Past Medical History:  Past Medical History  Diagnosis Date  . ANEMIA 10/21/2008  . AORTIC INSUFFICIENCY 11/26/2009  . BACK PAIN 06/27/2007  . COLON CANCER, PERSONAL HX 02/25/2010  . Diverticulosis of colon (without mention of hemorrhage) 08/14/2006  . GALLSTONES 10/09/2008  . GLAUCOMA NOS 08/14/2006  . HYPERTENSION 08/14/2006  . HYPOKALEMIA, HX OF 11/26/2009  . MELENA, HX OF 10/12/2008  . NEOPLASM, MALIGNANT, COLON, CECUM 10/30/2008  . OCCLUSION, CENTRAL RETINAL ARTERY 08/14/2006  . PERSONAL HX COLONIC POLYPS 10/21/2008  . SYNCOPE, HX OF 08/14/2006  . URI 09/07/2008  . H/O: stroke     behind right eye   Past Surgical History:  Past Surgical History  Procedure Laterality Date  . Appendectomy    . Cataract extraction      bilateral  . Dilation and curettage of uterus    . Tonsillectomy    . Colon surgery      11/20/2008,gallbladder removed  . Ankle fracture surgery  2011   right   Social History:  reports that she has never smoked. She has never used smokeless tobacco. She reports that she does not drink alcohol or use illicit drugs.  Family / Support Systems Marital Status: Widow/Widower How Long?: 2 years Patient Roles: Parent, Other (Comment) Children: Reola Calkins - dtr/POA - [phone] (h)/ [phone] (c); Joesphine Bare - dtr - [phone] Other Supports: Meyer Cory - granddaughter - [phone] Anticipated Caregiver: Meyer Cory - granddaughter Ability/Limitations of Caregiver: Daughter pays granddaughter from pt's estate to care for patient. Caregiver Availability: 24/7 Family Dynamics: close, supportive family  Social History Preferred language: English Religion: Presbyterian Read: Yes Write: Yes Employment Status: Retired Public relations account executive Issues: none reported Guardian/Conservator: Pt's dtr is her POA, however MD has determined that pt is capable of making her own decisions.   Abuse/Neglect Physical Abuse: Denies Verbal Abuse: Denies Sexual Abuse: Denies Exploitation of patient/patient's resources: Denies Self-Neglect: Denies  Emotional Status Pt's affect, behavior and adjustment status: Pt reports feeling well emotionally.  She is encouraged by improvement she's seen already.  She will let CSW know if her mood changes. Recent Psychosocial Issues: Pt and granddaughter are moving to a new home - one level.  Granddaughter's 43 month old twins live with them. Psychiatric History: Pt with a hx of depression following the death of her husband.  PCP monitors this and prescribes anti-depressant.   Substance Abuse History: none reported  Patient /  Family Perceptions, Expectations & Goals Pt/Family understanding of illness & functional limitations: Pt/family have a good understanding of her condition/limitations/needs. Premorbid pt/family roles/activities: Pt was not able to do much.  She would get out of the  house to go to the grocery store with family and MD appointments.  She helps to fold laundry. Anticipated changes in roles/activities/participation: Pt hopes to resume the above as she recovers. Pt/family expectations/goals: Pt wants "to get stronger" and to "get up walking again."  [employer]: None Premorbid Home Care/DME Agencies: Other (Comment) ([facility]) Transportation available at discharge: family Resource referrals recommended: Support group (specify)  Discharge Planning Living Arrangements: Other relatives (granddaughter and her 55 month old twins) Support Systems: Children, Other relatives, Church/faith community Type of Residence: Private residence Insurance Resources: Chartered certified accountant Resources: Fish farm manager, Family Support Financial Screen Referred: No Living Expenses: Lives with family Money Management: Family Does the patient have any problems obtaining your medications?: No Home Management: Pt's granddaughter takes care of the home.  Pt is able to help by folding laundry. Patient/Family Preliminary Plans: Pt to return to living with her granddaughter, but in a new one-level more accessible home. Barriers to Discharge: Steps (but should be less stairs than house pt was in PTA) Social Work Anticipated Follow Up Needs: HH/OP, Support Group Expected length of stay: 14 - 17 days  Clinical Impression CSW met with pt and then later talked with pt's dtr via telephone to introduce self and role of CSW, as well as to complete assessment.  Pt was talkative with CSW and was happy to be on Rehab and wants to get stronger.  She has excellent family support and plans to return to her granddaughter's new home they recently bought that will be more accessible for pt.  Dtr feels that timing of pt's d/c will work with the move.  Dtr takes care of pt on the weekends and granddaughter during the week.  Pt also has another dtr and a son.  Pt was  supposed to go to [place] to the [place] during the [place], but will be on Rehab instead during that time.  Pt is appreciative of the care/love she receives from her family.  Her husband died a couple of years ago, and she still seems saddened that he is gone, but reports her mood is stable.  CSW will continue to assess for mood changes.  Pt reports being tired, but otherwise feels okay.  Pt has HH from [facility] and CSW will notify them of pt's d/c and order any needed DME, although pt has some items at home.  CSW will continue to follow and assist as needed.  [place], Silvestre Mesi 10/07/2015, 11:10 AM

## 2015-10-08 ENCOUNTER — Inpatient Hospital Stay (HOSPITAL_COMMUNITY): Payer: Medicare Other | Admitting: Speech Pathology

## 2015-10-08 ENCOUNTER — Inpatient Hospital Stay (HOSPITAL_COMMUNITY): Payer: Medicare Other | Admitting: Physical Therapy

## 2015-10-08 ENCOUNTER — Encounter (HOSPITAL_COMMUNITY): Payer: Self-pay | Admitting: Radiology

## 2015-10-08 ENCOUNTER — Inpatient Hospital Stay (HOSPITAL_COMMUNITY): Payer: Medicare Other | Admitting: Occupational Therapy

## 2015-10-08 ENCOUNTER — Inpatient Hospital Stay (HOSPITAL_COMMUNITY): Payer: Medicare Other

## 2015-10-08 NOTE — IPOC Note (Signed)
Overall Plan of Care [facility]) Patient Details Name: Karen [name] MRN: [id number] DOB: [birth year]  Admitting Diagnosis: Rt CVA  Hospital Problems: Active Problems:   CVA (cerebral infarction)     Functional Problem List: Nursing Bladder, Edema, Endurance, Pain  PT Balance, Perception, Safety, Sensory, Endurance, Motor  OT Balance, Endurance, Motor, Sensory, Skin Integrity, Cognition, Vision  SLP Linguistic, Nutrition  TR         Basic ADL's: OT Grooming, Bathing, Dressing, Toileting, Eating     Advanced  ADL's: OT Light Housekeeping     Transfers: PT Bed Mobility, Bed to Chair, Car, Manufacturing systems engineer, Metallurgist: PT Ambulation, Emergency planning/management officer, Stairs     Additional Impairments: OT Fuctional Use of Upper Extremity  SLP Swallowing, Communication expression    TR      Anticipated Outcomes Item Anticipated Outcome  Self Feeding Mod I  Swallowing  mod I    Basic self-care  Min A   Toileting  Supervision    Bathroom Transfers Min A   Bowel/Bladder  Mod I  Transfers  S  Locomotion  S in home with LRAD  Communication  mod I   Cognition     Pain  <3  Safety/Judgment  Supervision   Therapy Plan: PT Intensity: Minimum of 1-2 x/day ,45 to 90 minutes PT Frequency: 5 out of 7 days PT Duration Estimated Length of Stay: 14-17 days OT Intensity: Minimum of 1-2 x/day, 45 to 90 minutes OT Frequency: 5 out of 7 days OT Duration/Estimated Length of Stay: 14-17 days  SLP Intensity: Minumum of 1-2 x/day, 30 to 90 minutes SLP Frequency: 1 to 3 out of 7 days SLP Duration/Estimated Length of Stay: 7-10 days for SLP        Team Interventions: Nursing Interventions Skin Care/Wound Management, Pain Management, Disease Management/Prevention, Dysphagia/Aspiration Precaution Training  PT interventions Ambulation/gait training, Cognitive remediation/compensation, Community reintegration, Training and development officer, Disease management/prevention,  Discharge planning, DME/adaptive equipment instruction, Neuromuscular re-education, Functional mobility training, Patient/family education, Stair training, Therapeutic Activities, Therapeutic Exercise, UE/LE Coordination activities, UE/LE Strength taining/ROM, Wheelchair propulsion/positioning, Visual/perceptual remediation/compensation  OT Interventions Training and development officer, Cognitive remediation/compensation, Discharge planning, Disease mangement/prevention, DME/adaptive equipment instruction, Neuromuscular re-education, Functional mobility training, Pain management, Patient/family education, Psychosocial support, UE/LE Strength taining/ROM, Therapeutic Exercise, Therapeutic Activities, Visual/perceptual remediation/compensation, UE/LE Coordination activities, Self Care/advanced ADL retraining  SLP Interventions Cueing hierarchy, Functional tasks, Internal/external aids, Patient/family education, Dysphagia/aspiration precaution training  TR Interventions    SW/CM Interventions Discharge Planning, Psychosocial Support, Patient/Family Education    Team Discharge Planning: Destination: PT-Home ,OT- Home , SLP-Home Projected Follow-up: PT-Home health PT, OT-  Outpatient OT, SLP-Other (comment) (TBD) Projected Equipment Needs: PT-To be determined, OT-  , SLP-None recommended by SLP Equipment Details: PT-likely none needed, has RW and quad cane, OT-Pt reports DME at home to include: BSC, tub shower bench, shower chair, cane, and RW.  Patient/family involved in discharge planning: PT- Patient, Family member/caregiver,  OT-Patient, SLP-Patient  MD ELOS: 14-18d Medical Rehab Prognosis:  Good Assessment: 82 y.o. right handed female with history of Colorectal cancer diagnosed 2010 status post resection followed by Dr. 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 Patient lives with granddaughter and 102-month-old twins. Used an occasional cane prior to admission. Granddaughter works  out of the house. Presented 10/03/2015 left sided weakness and slurred speech. Cranial CT scan negative. MRI of the brain showed acute lacunar type infarct tracking from the posterior right corona radiata to the posterior lentiform nuclei. CT  angiogram of head and neck no acute findings. Echocardiogram with ejection fraction [id number] grade 1 diastolic dysfunction. Patient did not receive TPA. Neurology consulted presently on Plavix for CVA prophylaxis. Subcutaneous Lovenox for DVT prophylaxis. Dysphagia #2 thin liquid diet   Now requiring 24/7 Rehab RN,MD, as well as CIR level PT, OT and SLP.  Treatment team will focus on ADLs and mobility with goals set at [place]  See Team Conference Notes for weekly updates to the plan of care

## 2015-10-08 NOTE — Progress Notes (Signed)
Physical Therapy Session Note  Patient Details  Name: Karen [name] MRN: [id number] Date of Birth: 29-Jul-1922  Today's Date: 10/08/2015 PT Individual Time: 1415-1530 PT Individual Time Calculation (min): 75 min   Short Term Goals: Week 1:  PT Short Term Goal 1 (Week 1): Pt will perform stand pivot transfer with consistent minA PT Short Term Goal 2 (Week 1): Pt will perform gait with consistent minA x100' PT Short Term Goal 3 (Week 1): Pt will perform ascent/descent 8 3-inch stairs with minA for strengthening and NMR PT Short Term Goal 4 (Week 1): Pt will perform dynamic standing balance x5 min with min guard while engaged in functional BUE task  Skilled Therapeutic Interventions/Progress Updates:    Pt received supine in bed, denies pain and agreeable to treatment. Vitals in supine 153/70 HR 88, sitting 154/69 HR 88. Sit <>stand from EOB with w/c handle for balance in RUE; requires maxA d/t pushing with R extremities, L lateral weight shift and LLE stance control deficits. Squat pivot transfer bed>w/c modA to R side with cues for weight shift and sequencing. Pt dons shoes with maxA to facilitate anterior weight shift and dynamic sitting balance. Transfer w/c <>mat table x2 modA squat pivot. Sitting balance with R lateral reaching for horseshoes from granddaughter to facilitate R lateral weight shift and midline awareness. Sit <>stand x3 trials with maxA and verbal/tactile cues for R weight shift. Gait x25' with R rail in hall and maxA for LLE progression and stance control. 1 set 10 reps sitting LE strengthening exercises with pt/family given handout for exercise performance over the weekend. Pt returned to room totalA; agreeable to remain up in chair with encouragement from family and all needs in reach.   Therapy Documentation Precautions:  Precautions Precautions: Fall Restrictions Weight Bearing Restrictions: No Pain: Pain Assessment Pain Assessment: No/denies pain   See Function  Navigator for Current Functional Status.   Therapy/Group: Individual Therapy  Luberta Mutter 10/08/2015, 3:54 PM

## 2015-10-08 NOTE — Progress Notes (Addendum)
82 y.o. right handed female with history of Colorectal cancer diagnosed 2010 status post resection followed by Dr. Alen Blew, hypertension, , blindness to left eye, hard of hearing. Patient lives with granddaughter and 78-monthold twins. Used an occasional cane prior to admission. Granddaughter works out of the house. Presented 10/03/2015 left sided weakness and slurred speech. Cranial CT scan negative. MRI of the brain showed acute lacunar type infarct tracking from the posterior right corona radiata to the posterior lentiform nuclei. CT angiogram of head and neck no acute findings. Echocardiogram with ejection fraction 617%grade 1 diastolic dysfunction. Patient did not receive TPA. Neurology consulted presently on Plavix for CVA prophylaxis. Subcutaneous Lovenox for DVT prophylaxis. Dysphagia #2 thin liquid diet  Subjective/Complaints: Eating breakfast coughing and pocketing ROS- denies CP, SOB, N/V/D Objective: Vital Signs: Blood pressure 167/68, pulse 81, temperature 97.6 F (36.4 C), temperature source Oral, resp. rate 16, height 5' 7" (1.702 m), weight 69.627 kg (153 lb 8 oz), SpO2 96 %. No results found. Results for orders placed or performed during the hospital encounter of 10/05/15 (from the past 72 hour(s))  CBC     Status: None   Collection Time: 10/05/15  7:28 PM  Result Value Ref Range   WBC 8.2 4.0 - 10.5 K/uL   RBC 4.37 3.87 - 5.11 MIL/uL   Hemoglobin 12.7 12.0 - 15.0 g/dL   HCT 38.9 36.0 - 46.0 %   MCV 89.0 78.0 - 100.0 fL   MCH 29.1 26.0 - 34.0 pg   MCHC 32.6 30.0 - 36.0 g/dL   RDW 14.4 11.5 - 15.5 %   Platelets 220 150 - 400 K/uL  Creatinine, serum     Status: Abnormal   Collection Time: 10/05/15  7:28 PM  Result Value Ref Range   Creatinine, Ser 0.88 0.44 - 1.00 mg/dL   GFR calc non Af Amer 56 (L) >60 mL/min   GFR calc Af Amer >60 >60 mL/min    Comment: (NOTE) The eGFR has been calculated using the CKD EPI equation. This calculation has not been validated in all  clinical situations. eGFR's persistently <60 mL/min signify possible Chronic Kidney Disease.   CBC WITH DIFFERENTIAL     Status: None   Collection Time: 10/06/15  7:21 AM  Result Value Ref Range   WBC 7.7 4.0 - 10.5 K/uL   RBC 4.36 3.87 - 5.11 MIL/uL   Hemoglobin 12.7 12.0 - 15.0 g/dL   HCT 38.2 36.0 - 46.0 %   MCV 87.6 78.0 - 100.0 fL   MCH 29.1 26.0 - 34.0 pg   MCHC 33.2 30.0 - 36.0 g/dL   RDW 14.3 11.5 - 15.5 %   Platelets 220 150 - 400 K/uL   Neutrophils Relative % 60 %   Neutro Abs 4.6 1.7 - 7.7 K/uL   Lymphocytes Relative 27 %   Lymphs Abs 2.1 0.7 - 4.0 K/uL   Monocytes Relative 10 %   Monocytes Absolute 0.8 0.1 - 1.0 K/uL   Eosinophils Relative 3 %   Eosinophils Absolute 0.2 0.0 - 0.7 K/uL   Basophils Relative 0 %   Basophils Absolute 0.0 0.0 - 0.1 K/uL  Comprehensive metabolic panel     Status: Abnormal   Collection Time: 10/06/15  7:21 AM  Result Value Ref Range   Sodium 138 135 - 145 mmol/L   Potassium 3.5 3.5 - 5.1 mmol/L   Chloride 105 101 - 111 mmol/L   CO2 25 22 - 32 mmol/L   Glucose, Bld 124 (H)  65 - 99 mg/dL   BUN 10 6 - 20 mg/dL   Creatinine, Ser 0.59 0.44 - 1.00 mg/dL   Calcium 8.9 8.9 - 10.3 mg/dL   Total Protein 6.8 6.5 - 8.1 g/dL   Albumin 3.5 3.5 - 5.0 g/dL   AST 28 15 - 41 U/L   ALT 13 (L) 14 - 54 U/L   Alkaline Phosphatase 55 38 - 126 U/L   Total Bilirubin 0.6 0.3 - 1.2 mg/dL   GFR calc non Af Amer >60 >60 mL/min   GFR calc Af Amer >60 >60 mL/min    Comment: (NOTE) The eGFR has been calculated using the CKD EPI equation. This calculation has not been validated in all clinical situations. eGFR's persistently <60 mL/min signify possible Chronic Kidney Disease.    Anion gap 8 5 - 15     HEENT: Left eye deviated laterally and upward, no vision Cardio: RRR and no murmur Resp: CTA B/L and unlabored GI: BS positive and NT, ND Extremity:  Pulses positive and No Edema Skin:   Other erythema around IV site Neuro: Alert/Oriented, Normal  Sensory, Abnormal Motor 0 in left delt, bi, tri, 0/5 grip and trace Left hip/knee ext synergy otherwise 0 Other left neglect vs visual loss L eye Musc/Skelno pain with UE or LE ROMOther no pain with UE or LE ROM Gen NAD   Assessment/Plan: 1. Functional deficits secondary to  Left-sided weakness with dysarthria/dysphagia secondary to right basal ganglia corona radiata infarct which require 3+ hours per day of interdisciplinary therapy in a comprehensive inpatient rehab setting. Physiatrist is providing close team supervision and 24 hour management of active medical problems listed below. Physiatrist and rehab team continue to assess barriers to discharge/monitor patient progress toward functional and medical goals. FIM: Function - Bathing Position: Shower Body parts bathed by patient: Right arm, Left arm, Chest, Abdomen, Front perineal area, Right upper leg, Left upper leg Body parts bathed by helper: Buttocks, Back, Left lower leg, Right lower leg Assist Level:  (Mod A)  Function- Upper Body Dressing/Undressing What is the patient wearing?: Pull over shirt/dress, Bra Bra - Perfomed by patient: Thread/unthread right bra strap, Thread/unthread left bra strap Bra - Perfomed by helper: Hook/unhook bra (pull down sports bra) Pull over shirt/dress - Perfomed by patient: Thread/unthread right sleeve, Put head through opening Pull over shirt/dress - Perfomed by helper: Thread/unthread left sleeve, Pull shirt over trunk Assist Level: Set up (Mod A) Set up : To obtain clothing/put away Function - Lower Body Dressing/Undressing What is the patient wearing?: Non-skid slipper socks, Pants Position: Bed Pants- Performed by helper: Thread/unthread right pants leg, Thread/unthread left pants leg, Pull pants up/down (Due to lack of time) Non-skid slipper socks- Performed by helper: Don/doff right sock, Don/doff left sock Assist for footwear: Maximal assist Assist for lower body dressing:  (Max  A)  Function - Toileting Toileting steps completed by patient: Adjust clothing prior to toileting Toileting steps completed by helper: Performs perineal hygiene, Adjust clothing after toileting Toileting Assistive Devices: Grab bar or rail Assist level: Touching or steadying assistance (Pt.75%)  Function - Air cabin crew transfer assistive device: Grab bar Assist level to toilet: Maximal assist (Pt 25 - 49%/lift and lower) Assist level from toilet: Maximal assist (Pt 25 - 49%/lift and lower)  Function - Chair/bed transfer Chair/bed transfer method: Squat pivot Chair/bed transfer assist level: Maximal assist (Pt 25 - 49%/lift and lower) Chair/bed transfer assistive device: Armrests Chair/bed transfer details: Tactile cues for weight shifting, Tactile cues  for posture, Verbal cues for precautions/safety, Verbal cues for technique, Verbal cues for sequencing  Function - Locomotion: Wheelchair Will patient use wheelchair at discharge?: No Type: Manual Max wheelchair distance: 150 Assist Level: Supervision or verbal cues Assist Level: Supervision or verbal cues Assist Level: Supervision or verbal cues Turns around,maneuvers to table,bed, and toilet,negotiates 3% grade,maneuvers on rugs and over doorsills: No Function - Locomotion: Ambulation Assistive device: Hand held assist, Rail in hallway Max distance: 50 Assist level: Moderate assist (Pt 50 - 74%) Assist level: Moderate assist (Pt 50 - 74%) Assist level: Moderate assist (Pt 50 - 74%) Walk 150 feet activity did not occur: Safety/medical concerns Walk 10 feet on uneven surfaces activity did not occur: Safety/medical concerns  Function - Comprehension Comprehension: Auditory Comprehension assistive device: Hearing aids Comprehension assist level: Follows basic conversation/direction with extra time/assistive device  Function - Expression Expression: Verbal Expression assist level: Expresses basic needs/ideas: With  extra time/assistive device  Function - Social Interaction Social Interaction assist level: Interacts appropriately 90% of the time - Needs monitoring or encouragement for participation or interaction.  Function - Problem Solving Problem solving assist level: Solves basic 75 - 89% of the time/requires cueing 10 - 24% of the time  Function - Memory Memory assist level: Recognizes or recalls 75 - 89% of the time/requires cueing 10 - 24% of the time Patient normally able to recall (first 3 days only): Current season, Location of own room, That he or she is in a hospital  Medical Problem List and Plan: 1.  Left-sided weakness with dysarthria/dysphagia secondary to right basal ganglia corona radiata infarct- appears weaker on left today, ? Natural evolution of subcortical infarct vs extension will check CT head 2.  DVT Prophylaxis/Anticoagulation: Subcutaneous Lovenox. Monitor platelet counts or any signs of bleeding, plt 220K 3. Pain Management: Tylenol as needed 4. Dysphagia. Dysphagia #2 thin liquid diet. Severe pocketing needs cues to clear , coughing will write for supervision with meals, will monitor today to see if pt need downgrade given increased weakness 5. Neuropsych: This patient is capable of making decisions on her own behalf. 6. Skin/Wound Care: Routine skin checks 7. Fluids/Electrolytes/Nutrition: Routine I&O's BMET normal 7/12 8. Hypertension. Permissive hypertension. Monitor with increased mobility. Patient on Tenormin 50 mg daily prior to admission. 9. Mood. Resume Lexapro 10 mg daily 10. Hard of hearing. 11. Decreased vision right eye. 12. Hyperlipidemia. Pravachol 13. History of colorectal cancer diagnosed 2010. Status post resection. Presently in remission. Followed by Dr. Alen Blew  Repeat CT head shows some increased edema at the prior infarct site, no new areas.  Cont rehab, no change in anticoag.  Monitor clinically LOS (Days) 3 A FACE TO FACE EVALUATION WAS  PERFORMED  KIRSTEINS,ANDREW E 10/08/2015, 8:10 AM

## 2015-10-08 NOTE — Progress Notes (Signed)
Speech Language Pathology Daily Session Note  Patient Details  Name: Karen [name] MRN: [id number] Date of Birth: Feb 25, 1923  Today's Date: 10/08/2015 SLP Individual Time: 1133-1200 SLP Individual Time Calculation (min): 27 min  Short Term Goals: Week 1: SLP Short Term Goal 1 (Week 1): STG=LTG due to anticipated short duration of ST services.    Skilled Therapeutic Interventions:  Pt was seen for skilled ST targeting dysphagia goals.  SLP facilitated the session with a functional snack of dys 2 textures to assess toleration given increased pocketing and coughing noted during yesterday's therapy session.  Pt with dry, nonproductive cough prior to consumption of POs.  No overt s/s of aspiration evident with solids or liquids in isolation.  Wet vocal quality and x1 delayed cough noted with mixed consistencies.  Vocal quality cleared with min verbal cues for use of volitional throat clear.  Pt's lung sounds remain clear, pt afebrile, and O2 sats WFL on room air per chart review.  Recommend for now that pt remain on currently prescribed diet with trials of advanced textures with SLP only.  Pt left in wheelchair with quick release belt donned and call bell within reach.  Continue per current plan of care.     Function:  Eating Eating   Modified Consistency Diet: Yes Eating Assist Level: More than reasonable amount of time;Set up assist for;Supervision or verbal cues   Eating Set Up Assist For: Opening containers       Cognition Comprehension Comprehension assist level: Follows basic conversation/direction with extra time/assistive device  Expression   Expression assist level: Expresses basic needs/ideas: With extra time/assistive device  Social Interaction Social Interaction assist level: Interacts appropriately 90% of the time - Needs monitoring or encouragement for participation or interaction.  Problem Solving Problem solving assist level: Solves basic 75 - 89% of the time/requires cueing  10 - 24% of the time  Memory Memory assist level: Recognizes or recalls 75 - 89% of the time/requires cueing 10 - 24% of the time    Pain Pain Assessment Pain Assessment: No/denies pain  Therapy/Group: Individual Therapy  Noma Quijas, Selinda Orion 10/08/2015, 12:34 PM

## 2015-10-08 NOTE — Progress Notes (Signed)
Cranial CT scan completed this morning 10/08/2015 shows evolution of recent infarct arising from the posterior right lentiform nucleus extending into the inferior right posterior right centrum semiovale ovale. No new infarct identified. Spoke with neurology services advised to continue Plavix for CVA prophylaxis. All issues discussed with patient and granddaughter.

## 2015-10-08 NOTE — Progress Notes (Signed)
Occupational Therapy Session Note  Patient Details  Name: Karen [name] MRN: [id number] Date of Birth: 1922/12/07  Today's Date: 10/08/2015 OT Individual Time: [id number] and 1300-1346 OT Individual Time Calculation (min): 47 min and 46 min    Short Term Goals: Week 1:  OT Short Term Goal 1 (Week 1): Pt will maintain dynamic seated balance with S for improved activity engagement.  OT Short Term Goal 2 (Week 1): Pt will complete UB dressing with supervision. OT Short Term Goal 3 (Week 1): Pt will complete LB dressing using AD with supervision. OT Short Term Goal 4 (Week 1): Pt will perform toilet transfers with Min A.   Skilled Therapeutic Interventions/Progress Updates:    1 of 2) Treatment session with focus on self care skills and functional transfers. Pt in bed, awake and alert, at start of session. Agreeable to therapy. Min A squat pivot transfer from bed to WC. Sit to stand WC <> tub bench at Mod A. Verbal cues in shower for maintaining midline seated posture and for safety issues as pt demonstrates increased L lean in sitting. Pt verbalized requests for assistance to wash her hair and non-affected limb with reports of having help at home. Pt verbalized recall of hemi dressing techniques taught yesterday with cues required today for sequencing and technique.   Pt completed oral hygiene seated in WC at sink with set up assist. Left pt in [facility] with L lap tray for safety, increased positioning, and visual/tactile input of affected LUE. Pt given call bell and phone.   2 of 2) Treatment session with focus on seated postural stability, visual scanning, and increased safety with transfers. Max A squat pivot WC > EOM. Completed activity seated at EOM to scan for specific colors/lengths (short/long) dowels in L field of vision then transfer to peg board in R field of vision using RUE for improved visual scanning, to assess attention, and for seated postural stability challenge. Pt required Mod A to  return to midline when reaching to L side with R hand and verbal cues to Min A to return to midline after reaching to R side.   Completed transfer training with STEDY for improved safety. Pt education given regarding sequencing, purpose, and safety precautions with verbal assent given. Pt completed transfer from EOM > WC in STEDY with verbal cues for sequencing and manual facilitation for weight shifting.   Pt transferred back to bed at end of session due to reports of fatigue. Max A WC > bed with +2 for bed mobility due to extension of stroke. Pt left with all needs in reach and bed alarm set. Family in room at end of session.   Therapy Documentation Precautions:  Precautions Precautions: Fall Restrictions Weight Bearing Restrictions: No General:   Vital Signs:  Pain: Pain Assessment Pain Assessment: No/denies pain  See Function Navigator for Current Functional Status.   Therapy/Group: Individual Therapy  Dierdre Searles 10/08/2015, 12:01 PM

## 2015-10-09 ENCOUNTER — Inpatient Hospital Stay (HOSPITAL_COMMUNITY): Payer: Medicare Other | Admitting: *Deleted

## 2015-10-09 ENCOUNTER — Inpatient Hospital Stay (HOSPITAL_COMMUNITY): Payer: Medicare Other | Admitting: Occupational Therapy

## 2015-10-09 MED ORDER — MUSCLE RUB 10-15 % EX CREA
TOPICAL_CREAM | CUTANEOUS | Status: DC | PRN
  Administered 2015-10-12: 13:00:00 via TOPICAL
  Filled 2015-10-09: qty 85

## 2015-10-09 NOTE — Progress Notes (Signed)
Occupational Therapy Session Note  Patient Details  Name: Karen [name] MRN: [id number] Date of Birth: 09/20/1922  Today's Date: 10/09/2015 OT Individual Time: [id number] OT Individual Time Calculation (min): 56 min    Short Term Goals: Week 1:  OT Short Term Goal 1 (Week 1): Pt will maintain dynamic seated balance with S for improved activity engagement.  OT Short Term Goal 2 (Week 1): Pt will complete UB dressing with supervision. OT Short Term Goal 3 (Week 1): Pt will complete LB dressing using AD with supervision. OT Short Term Goal 4 (Week 1): Pt will perform toilet transfers with Min A.   Skilled Therapeutic Interventions/Progress Updates:    Pt reports LUE feeling "numb" at start of session. Clinician assessed light sensation awareness and inquired about pain prior to initiating session. Pt with no c/o pain and able to sense light touch with occluded vision.    Treatment session with focus on self care skills and functional transfers. Pt reports fatigue at start of session yet agreeable to therapy. Pt completed UB bathing seated in WC at sink with verbal cues for attention. Pt aware of hemi dressing technique taught previously but unable to recall sequence or method without verbal and visual cues with physical assist.   Pt reported feeling need to use toilet. Stand pivot transfer completed with Max A from [facility] <> toilet and pt unable to void. Max A for LB donning/doffing clothing and briefs. Stand pivot WC > bed at end of session with pt at Max A for bed mobility due to fatigue. Pt given call bell and phone, left in bed with alarm set.   Therapy Documentation Precautions:  Precautions Precautions: Fall Restrictions Weight Bearing Restrictions: No General:   Vital Signs:  Pain:   Pt with no c/o pain.  See Function Navigator for Current Functional Status.   Therapy/Group: Individual Therapy  Dierdre Searles 10/09/2015, 12:25 PM

## 2015-10-09 NOTE — Progress Notes (Signed)
Karen [name] is a 82 y.o. female Feb 27, 1923 [id number]  Subjective: No new complaints. No new problems. Slept well. Feeling OK. RN requests muscle rub for pt L hip  Objective: Vital signs in last 24 hours: Temp:  [97.6 F (36.4 C)-97.8 F (36.6 C)] 97.8 F (36.6 C) (07/15 0524) Pulse Rate:  [78-81] 78 (07/15 0524) Resp:  [17] 17 (07/15 0524) BP: (163)/(62-68) 163/68 mmHg (07/15 0524) SpO2:  [96 %-97 %] 96 % (07/15 0524) Weight change:  Last BM Date: 10/08/15  Intake/Output from previous day: 07/14 0701 - 07/15 0700 In: 600 [P.O.:600] Out: -   Physical Exam General: No apparent distress HOH  Pleasant, PT at side Lungs: Normal effort. Lungs clear to auscultation, no crackles or wheezes. Cardiovascular: Regular rate and rhythm, no edema Neurological: No new neurological deficits   Lab Results: BMET    Component Value Date/Time   NA 138 10/06/2015 0721   NA 141 06/30/2015 0955   NA 145 12/13/2010 1053   K 3.5 10/06/2015 0721   K 3.7 06/30/2015 0955   K 4.0 12/13/2010 1053   CL 105 10/06/2015 0721   CL 103 06/24/2012 0932   CL 102 12/13/2010 1053   CO2 25 10/06/2015 0721   CO2 27 06/30/2015 0955   CO2 27 12/13/2010 1053   GLUCOSE 124* 10/06/2015 0721   GLUCOSE 110 06/30/2015 0955   GLUCOSE 110* 06/24/2012 0932   GLUCOSE 100 12/13/2010 1053   BUN 10 10/06/2015 0721   BUN 13.9 06/30/2015 0955   BUN 15 12/13/2010 1053   CREATININE 0.59 10/06/2015 0721   CREATININE 0.8 06/30/2015 0955   CREATININE 0.8 12/13/2010 1053   CALCIUM 8.9 10/06/2015 0721   CALCIUM 9.2 06/30/2015 0955   CALCIUM 9.1 12/13/2010 1053   GFRNONAA >60 10/06/2015 0721   GFRAA >60 10/06/2015 0721   CBC    Component Value Date/Time   WBC 7.7 10/06/2015 0721   WBC 12.3* 06/30/2015 0954   RBC 4.36 10/06/2015 0721   RBC 4.25 06/30/2015 0954   HGB 12.7 10/06/2015 0721   HGB 12.6 06/30/2015 0954   HCT 38.2 10/06/2015 0721   HCT 37.7 06/30/2015 0954   PLT 220 10/06/2015 0721   PLT 239  06/30/2015 0954   MCV 87.6 10/06/2015 0721   MCV 88.7 06/30/2015 0954   MCH 29.1 10/06/2015 0721   MCH 29.6 06/30/2015 0954   MCHC 33.2 10/06/2015 0721   MCHC 33.4 06/30/2015 0954   RDW 14.3 10/06/2015 0721   RDW 14.4 06/30/2015 0954   LYMPHSABS 2.1 10/06/2015 0721   LYMPHSABS 1.8 06/30/2015 0954   MONOABS 0.8 10/06/2015 0721   MONOABS 0.8 06/30/2015 0954   EOSABS 0.2 10/06/2015 0721   EOSABS 0.1 06/30/2015 0954   BASOSABS 0.0 10/06/2015 0721   BASOSABS 0.0 06/30/2015 0954   CBG's (last 3):  No results for input(s): GLUCAP in the last 72 hours. LFT's Lab Results  Component Value Date   ALT 13* 10/06/2015   AST 28 10/06/2015   ALKPHOS 55 10/06/2015   BILITOT 0.6 10/06/2015    Studies/Results: Ct Head Wo Contrast  10/08/2015  CLINICAL DATA:  Increasing left-sided weakness EXAM: CT HEAD WITHOUT CONTRAST TECHNIQUE: Contiguous axial images were obtained from the base of the skull through the vertex without intravenous contrast. COMPARISON:  Head CT and brain MRI October 03, 2015 FINDINGS: Brain: There is mild diffuse atrophy. There is no intracranial mass, hemorrhage, extra-axial fluid collection, or midline shift. There is evidence of evolution of a recently demonstrated  infarct extending from the posterior right lentiform nucleus into the posterior inferior right centrum semiovale. There is cytotoxic edema throughout this area, increased from recent prior studies. Elsewhere, there is fairly extensive small vessel disease in the centra semiovale bilaterally, stable. No new acute infarct is evident compared to recent prior studies. Vascular: There is calcification in the cavernous carotid arteries bilaterally. No hyperdense appearing vessel is evident. Skull:  The bony calvarium appears intact. Sinuses/Orbits: There is increased attenuation material in the left globe, likely due to vitreous hemorrhage. There is mild flattening of the globe on the left compared to the right side, which appears  normal. No other intraorbital lesions are evident. There is mucosal thickening in several ethmoid air cells bilaterally. Other paranasal sinuses are clear. Other: Mastoid air cells are clear bilaterally except for opacification of a single inferior posterior left-sided ethmoid air cell, stable. IMPRESSION: Evidence of evolution of the recent infarct arising from the posterior right lentiform nucleus extending into the inferior posterior right centrum semiovale. No new infarct identified. There is atrophy with periventricular small vessel disease, stable. No hemorrhage or mass. Evidence of vitreous hemorrhage on the left, age uncertain and stable. Mild ethmoid sinus disease bilaterally. Opacification of a single mastoid air cell on the left, stable. Focal calcification in the cavernous carotid arteries bilaterally. Electronically Signed   By: Lowella Grip III M.D.   On: 10/08/2015 09:39    Medications:  I have reviewed the patient's current medications. Scheduled Medications: . brimonidine  1 drop Both Eyes Q8H  . clopidogrel  75 mg Oral Daily  . enoxaparin (LOVENOX) injection  40 mg Subcutaneous Q24H  . escitalopram  10 mg Oral Daily  . latanoprost  1 drop Right Eye QHS  . pravastatin  10 mg Oral q1800   PRN Medications: acetaminophen, cloNIDine, ondansetron **OR** ondansetron (ZOFRAN) IV, senna-docusate, sorbitol  Assessment/Plan: Active Problems:   CVA (cerebral infarction)  1. Left-sided weakness with dysarthria/dysphagia secondary to right basal ganglia corona radiata infarct- repeat CT head 7/14: no new CVA or extension. Continue CIR 2. DVT Prophylaxis/Anticoagulation: Subcutaneous Lovenox. Monitor platelet counts or any signs of bleeding, plt 220K 3. Pain Management: Tylenol as needed 4. Dysphagia. Dysphagia #2 thin liquid diet. Severe pocketing needs cues to clear, supervision with meals due to coughing; monitor 5. Neuropsych: This patient is capable of making decisions on her  own behalf. 6. Skin/Wound Care: Routine skin checks 7. Fluids/Electrolytes/Nutrition: Routine I&O's BMET normal 7/12 8. Hypertension. Permissive hypertension. Monitor with increased mobility. Patient on Tenormin 50 mg daily prior to admission. 9. Mood. Lexapro 10 mg daily 10. Hard of hearing. Hearing aides intact B 11. Decreased vision right eye. 12. Hyperlipidemia. Pravachol 13. History of colorectal cancer diagnosed 2010. Status post resection. Presently in remission. Followed by Dr. Alen Blew  Length of stay, days: 4  Valerie A. Asa Lente, MD 10/09/2015, 11:36 AM

## 2015-10-09 NOTE — Progress Notes (Signed)
Physical Therapy Session Note  Patient Details  Name: Karen [name] MRN: [id number] Date of Birth: 06-Nov-1922  Today's Date: 10/09/2015 PT Individual Time: 1300-1330 PT Individual Time Calculation (min): 30 min   Short Term Goals: Week 1:  PT Short Term Goal 1 (Week 1): Pt will perform stand pivot transfer with consistent minA PT Short Term Goal 2 (Week 1): Pt will perform gait with consistent minA x100' PT Short Term Goal 3 (Week 1): Pt will perform ascent/descent 8 3-inch stairs with minA for strengthening and NMR PT Short Term Goal 4 (Week 1): Pt will perform dynamic standing balance x5 min with min guard while engaged in functional BUE task  Skilled Therapeutic Interventions/Progress Updates:     Tx focuesd on functional mobiltiy training, standing frame, and WC propulsion. Pt sleeping soundly again upon arrival, but able to arouse. Discussed staying awake as able to sleep better tonight.  Supine>sit and squat-pivot transfer with Max A.  Pt propelled WC x150' in controlled setting with S and cues for hemi-technique.  Procured new WC due to broken brake, added back for postural control. Pt stood in standing frame x10 min for LLE ext motor control with up to Max A for midline and trunk control while filling rx bottle.  Pt lef tup in [facility] with RN for lunch.   Therapy Documentation Precautions:  Precautions Precautions: Fall Restrictions Weight Bearing Restrictions: No General:   Vital Signs:   Pain: none     See Function Navigator for Current Functional Status.   Therapy/Group: Individual Therapy  Tsion Inghram, Corinna Lines, PT, DPT  10/09/2015, 1:32 PM

## 2015-10-09 NOTE — Progress Notes (Signed)
Physical Therapy Session Note  Patient Details  Name: Karen [name] MRN: [id number] Date of Birth: 1922-04-23  Today's Date: 10/09/2015 PT Individual Time: 0915-1015 PT Individual Time Calculation (min): 60 min   Short Term Goals: Week 1:  PT Short Term Goal 1 (Week 1): Pt will perform stand pivot transfer with consistent minA PT Short Term Goal 2 (Week 1): Pt will perform gait with consistent minA x100' PT Short Term Goal 3 (Week 1): Pt will perform ascent/descent 8 3-inch stairs with minA for strengthening and NMR PT Short Term Goal 4 (Week 1): Pt will perform dynamic standing balance x5 min with min guard while engaged in functional BUE task  Skilled Therapeutic Interventions/Progress Updates:  Pt deeply asleep upon arrival for 8AM tx, so returned later and she was alert and ready for tx.  Pt c/o L hip pain from "sleeping on it," which eased up with manual therapy and AAROM.   Supine NMR including each of the following with AAROM  for LLE Clamshells 2x10 AAROM ankle DF/PF x10 Heel slides x10 SAQ x10 with 5 sec holds Bridging x10  Hip ABD/ADD x10  Rolling R/L x3 each direction with Mod A  manual facilitation for full transitional movement and L UE/LE management. R sidelying>sit with Max A, but pt able to assist with RUE for trunk lifting.   Pt sat EOB x5 min with min A for balance due to posterior and L lean. Pt performed lateral leans to R elbow for midline orientation and sitting balance practice.   Seated reciprocal scooting and lateral hip scooting along edge of bed for practice anterior translating weight over BOS. Pt needed up to Max A, but able to assist with practice.   Stand-step transfer x1 with Max A.  Squat-pivot transfer practice x4 R/L with demonstration and multi-modal cues for safety and efficient movement. Pt needed Max A overall, but very effective for her due to low vision and limited LLE movement/stability in standing.   Sit<>stands at sink for standing  balance practice and LLE ext motor control. Pt able to stand x2 min with multi-modal cues and facilitation fro midlien orientation and upright psture.   Pt left up in [facility] with lap tray, call bell, and all needs in reach. Discussed breakfast and soft touch bell with RN. Sang Happy Birthday.       Therapy Documentation Precautions:  Precautions Precautions: Fall Restrictions Weight Bearing Restrictions: No General:   Vital Signs: Therapy Vitals Temp: 97.8 F (36.6 C) Temp Source: Oral Pulse Rate: 78 Resp: 17 BP: (!) 163/68 mmHg Patient Position (if appropriate): Lying Oxygen Therapy SpO2: 96 % O2 Device: Not Delivered Pain: none when asked to rate.      See Function Navigator for Current Functional Status.   Therapy/Group: Individual Therapy  Fenris Cauble Soundra Pilon, PT, DPT  10/09/2015, 7:53 AM

## 2015-10-10 ENCOUNTER — Inpatient Hospital Stay (HOSPITAL_COMMUNITY): Payer: Medicare Other | Admitting: Physical Therapy

## 2015-10-10 ENCOUNTER — Inpatient Hospital Stay (HOSPITAL_COMMUNITY): Payer: Medicare Other | Admitting: Occupational Therapy

## 2015-10-10 MED ORDER — DIPHENHYDRAMINE HCL 25 MG PO CAPS
25.0000 mg | ORAL_CAPSULE | Freq: Every evening | ORAL | Status: DC | PRN
  Administered 2015-10-10 – 2015-10-13 (×4): 25 mg via ORAL
  Filled 2015-10-10 (×4): qty 1

## 2015-10-10 NOTE — Progress Notes (Addendum)
Occupational Therapy Session Note  Patient Details  Name: Karen [name] MRN: [id number] Date of Birth: 05-04-1922  Today's Date: 10/10/2015 OT Individual Time: 1258-1400 OT Individual Time Calculation (min): 62 min    Skilled Therapeutic Interventions/Progress Updates:    Pt participated in skilled OT tx focusing on improving dynamic sitting balance and postural awareness in STEDY. Upon skilled OT arrival, pt was lying in bed asleep. With nursing and therapist encouragement, pt was agreeable to get OOB for tx. Pt completed supine to sit with Max A. Pt engaged in activity at EOB with dynamic sitting balance demands and therapeutic use of visiting grandson. Pt completed anterior and lateral weight shifts with steadying assistance at EOB with mod vcs for leaning forward due to posterior lean with fatigue. Pt participated in activity for approx 15 minutes without rest. Therapist facilitated WB through flaccid UE occurred during activity. Pt then transferred from bed to w/c via squat pivot with Max A and max instruction on technique. Pt taken to gym and transferred to [facility] lift. In STEDY pt participated in unilaterally involved cone stacking activity with visual biofeedback to attend to posture. WB through L UE facilitated during task. While static, pt exhibited proper midline orientation with cues to attend to visual biofeedback. During unilateral task pt exhibited strong left sided pushing and required cues to stop and correct posture. Leaning increased with fatigue. Pt was returned to w/c and self propelled to room with hemi technique and Min A for avoiding environmental barriers on left. Pt visual field was assessed in room with pt educated on visual deficits and compensatory head swiveling technique to increase safety during self propulsion with verbalized understanding. At end of session, pt left with family in w/c with all needs within reach and lap tray in place. Pt continues to benefit from  increasing postural awareness to improve balance during transfers and self care tasks.     Therapy Documentation Precautions:  Precautions Precautions: Fall Restrictions Weight Bearing Restrictions: No General:   Vital Signs: Therapy Vitals Temp: 98.1 F (36.7 C) Temp Source: Oral Pulse Rate: 95 Resp: 18 BP: 138/64 mmHg Patient Position (if appropriate): Sitting Oxygen Therapy SpO2: 95 % O2 Device: Not Delivered  :    See Function Navigator for Current Functional Status.   Therapy/Group: Individual Therapy  Anahlia Iseminger A Brier Firebaugh 10/10/2015, 3:41 PM

## 2015-10-10 NOTE — Progress Notes (Signed)
Karen [name] is a 82 y.o. female 10-21-1922 [id number]  Subjective: No new complaints. No new problems. Slept better w/ Benadryl last pm (as uses at home)l. Feeling OK.   Objective: Vital signs in last 24 hours: Temp:  [97.8 F (36.6 C)-98.1 F (36.7 C)] 97.8 F (36.6 C) (07/16 0405) Pulse Rate:  [92-96] 92 (07/16 0620) Resp:  [17-18] 17 (07/16 0405) BP: (150-201)/(68-81) 172/68 mmHg (07/16 0620) SpO2:  [97 %] 97 % (07/16 0405) Weight change:  Last BM Date: 10/09/15  Intake/Output from previous day: 07/15 0701 - 07/16 0700 In: 600 [P.O.:600] Out: -   Physical Exam General: No apparent distress HOH  Pleasant, aide at side Lungs: Normal effort. Lungs clear to auscultation, no crackles or wheezes. Cardiovascular: Regular rate and rhythm, no edema Neurological: No new neurological deficits L HP   Lab Results: BMET    Component Value Date/Time   NA 138 10/06/2015 0721   NA 141 06/30/2015 0955   NA 145 12/13/2010 1053   K 3.5 10/06/2015 0721   K 3.7 06/30/2015 0955   K 4.0 12/13/2010 1053   CL 105 10/06/2015 0721   CL 103 06/24/2012 0932   CL 102 12/13/2010 1053   CO2 25 10/06/2015 0721   CO2 27 06/30/2015 0955   CO2 27 12/13/2010 1053   GLUCOSE 124* 10/06/2015 0721   GLUCOSE 110 06/30/2015 0955   GLUCOSE 110* 06/24/2012 0932   GLUCOSE 100 12/13/2010 1053   BUN 10 10/06/2015 0721   BUN 13.9 06/30/2015 0955   BUN 15 12/13/2010 1053   CREATININE 0.59 10/06/2015 0721   CREATININE 0.8 06/30/2015 0955   CREATININE 0.8 12/13/2010 1053   CALCIUM 8.9 10/06/2015 0721   CALCIUM 9.2 06/30/2015 0955   CALCIUM 9.1 12/13/2010 1053   GFRNONAA >60 10/06/2015 0721   GFRAA >60 10/06/2015 0721   CBC    Component Value Date/Time   WBC 7.7 10/06/2015 0721   WBC 12.3* 06/30/2015 0954   RBC 4.36 10/06/2015 0721   RBC 4.25 06/30/2015 0954   HGB 12.7 10/06/2015 0721   HGB 12.6 06/30/2015 0954   HCT 38.2 10/06/2015 0721   HCT 37.7 06/30/2015 0954   PLT 220 10/06/2015 0721    PLT 239 06/30/2015 0954   MCV 87.6 10/06/2015 0721   MCV 88.7 06/30/2015 0954   MCH 29.1 10/06/2015 0721   MCH 29.6 06/30/2015 0954   MCHC 33.2 10/06/2015 0721   MCHC 33.4 06/30/2015 0954   RDW 14.3 10/06/2015 0721   RDW 14.4 06/30/2015 0954   LYMPHSABS 2.1 10/06/2015 0721   LYMPHSABS 1.8 06/30/2015 0954   MONOABS 0.8 10/06/2015 0721   MONOABS 0.8 06/30/2015 0954   EOSABS 0.2 10/06/2015 0721   EOSABS 0.1 06/30/2015 0954   BASOSABS 0.0 10/06/2015 0721   BASOSABS 0.0 06/30/2015 0954   CBG's (last 3):  No results for input(s): GLUCAP in the last 72 hours. LFT's Lab Results  Component Value Date   ALT 13* 10/06/2015   AST 28 10/06/2015   ALKPHOS 55 10/06/2015   BILITOT 0.6 10/06/2015    Studies/Results: No results found.  Medications:  I have reviewed the patient's current medications. Scheduled Medications: . brimonidine  1 drop Both Eyes Q8H  . clopidogrel  75 mg Oral Daily  . enoxaparin (LOVENOX) injection  40 mg Subcutaneous Q24H  . escitalopram  10 mg Oral Daily  . latanoprost  1 drop Right Eye QHS  . pravastatin  10 mg Oral q1800   PRN Medications: acetaminophen, cloNIDine,  diphenhydrAMINE, MUSCLE RUB, ondansetron **OR** ondansetron (ZOFRAN) IV, senna-docusate, sorbitol  Assessment/Plan: Active Problems:   CVA (cerebral infarction)  1. Left-sided weakness with dysarthria/dysphagia secondary to right basal ganglia corona radiata infarct- repeat CT head 7/14: no new CVA or extension. Continue CIR 2. DVT Prophylaxis/Anticoagulation: Subcutaneous Lovenox. Monitor platelet counts or any signs of bleeding, plt 220K 3. Pain Management: Tylenol as needed 4. Dysphagia. Dysphagia #2 thin liquid diet. Severe pocketing needs cues to clear, supervision with meals due to coughing; monitor 5. Neuropsych: This patient is capable of making decisions on her own behalf. 6. Skin/Wound Care: Routine skin checks 7. Fluids/Electrolytes/Nutrition: Routine I&O's BMET normal  7/12 8. Hypertension. Permissive hypertension. Monitor with increased mobility. Patient on Tenormin 50 mg daily prior to admission. 9. Mood. Lexapro 10 mg daily 10. Hard of hearing. Hearing aides intact B 11. Decreased vision right eye. 12. Hyperlipidemia. Pravachol 13. History of colorectal cancer diagnosed 2010. Status post resection. Presently in remission. Followed by Dr. Alen Blew  Length of stay, days: 5  Valerie A. Asa Lente, MD 10/10/2015, 10:04 AM

## 2015-10-10 NOTE — Progress Notes (Signed)
Physical Therapy Session Note  Patient Details  Name: Karen [name] MRN: [id number] Date of Birth: [birth year]  Today's Date: 10/10/2015 PT Individual Time: 0930-1030 and [id number]  PT Individual Time Calculation (min): 60 min and 27 min (total 87 min)  Short Term Goals: Week 1:  PT Short Term Goal 1 (Week 1): Pt will perform stand pivot transfer with consistent minA PT Short Term Goal 2 (Week 1): Pt will perform gait with consistent minA x100' PT Short Term Goal 3 (Week 1): Pt will perform ascent/descent 8 3-inch stairs with minA for strengthening and NMR PT Short Term Goal 4 (Week 1): Pt will perform dynamic standing balance x5 min with min guard while engaged in functional BUE task  Skilled Therapeutic Interventions/Progress Updates:    Tx 1: Pt received supine in bed, denies pain and agreeable to treatment however very lethargic and requires increased time for all activities. Supine>sit with minA using bedrail. Seated on EOB performed upper body dressing minA for threading LUE, maxA for lower body dressing with max/totalA for standing balance while pt attempted to pull up pants with RUE. Shoes donned totalA. Squat pivot transfer bed>w/c maxA with cues for hand placement and scooting. Brushed teeth at sink with setupA to open containers, apply toothpaste d/t visual impairments and LUE flaccid. Transfer w/c <>bed in ADL apartment to L/R side with maxA. Sit <>supine minA for LLE management and cues for hand placement and positioning. Pt requesting to return to room to use restroom. Stand pivot with grab bar maxA while therapist removed pants/brief. StandbyA for sitting balance on toilet with L posterolateral lean. Sit <>stand max/totalA for hygiene, and pt unable to maintain standing for therapist to don brief/pants. Second trial with Charlaine Dalton and maxA to complete hygiene and dressing. Pt very lethargic, leaning head forward onto stedy to rest. Returned pt to bed totalA in stedy, modA sit >supine for  BLE management. Remained supine in bed at completion of session, all needs in reach and RN alerted to pt position and recommendation to use only Stedy for transfers d/t significant fatigue.   Tx 2: Pt received seated in w/c, denies pain and agreeable to treatment however very fatigued after recent OT session. Pt reports urinary incontinence. Returned to bed squat pivot to R side modA. Sit >supine modA for BLE management d/t fatigue. Rolling R/L with bedrails and minA to A with changing brief/pants totalA. Performed bridging for clothing management however unable to clear hips to remove pants. Bridging for scooting toward [facility] and to R/L to center herself in bed. LEs floated, LUE supported by pillow and pt remained supine in bed at end of session, all needs in reach, alarm intact and granddaughter present.   Therapy Documentation Precautions:  Precautions Precautions: Fall Restrictions Weight Bearing Restrictions: No General: PT Amount of Missed Time (min): 15 Minutes PT Missed Treatment Reason: Patient fatigue Pain: Pain Assessment Pain Assessment: No/denies pain   See Function Navigator for Current Functional Status.   Therapy/Group: Individual Therapy  Luberta Mutter 10/10/2015, 3:09 PM

## 2015-10-11 ENCOUNTER — Inpatient Hospital Stay (HOSPITAL_COMMUNITY): Payer: Medicare Other | Admitting: Occupational Therapy

## 2015-10-11 ENCOUNTER — Inpatient Hospital Stay (HOSPITAL_COMMUNITY): Payer: Medicare Other | Admitting: Physical Therapy

## 2015-10-11 ENCOUNTER — Inpatient Hospital Stay (HOSPITAL_COMMUNITY): Payer: Medicare Other | Admitting: Speech Pathology

## 2015-10-11 MED ORDER — OXYBUTYNIN CHLORIDE 5 MG PO TABS
2.5000 mg | ORAL_TABLET | Freq: Every day | ORAL | Status: DC
  Administered 2015-10-11 – 2015-10-20 (×10): 2.5 mg via ORAL
  Filled 2015-10-11 (×10): qty 1

## 2015-10-11 NOTE — Progress Notes (Signed)
Subjective/Complaints: Doing better withy  Feeding , controlling amts, has had long term issues with sleep.  Uses multiple pillows at night, also c/o freq urination at noc. ROS- denies CP, SOB, N/V/D Objective: Vital Signs: Blood pressure 190/80, pulse 81, temperature 97.6 F (36.4 C), temperature source Oral, resp. rate 18, height 5\' 7"  (1.702 m), weight 69.627 kg (153 lb 8 oz), SpO2 96 %. No results found. No results found for this or any previous visit (from the past 72 hour(s)).   HEENT: Left eye deviated laterally and upward, no vision, neg JVD Cardio: RRR and no murmur Resp: CTA B/L and unlabored GI: BS positive and NT, ND Extremity:  Pulses positive and No Edema Skin:   Other erythema around IV site Neuro: Alert/Oriented, Normal Sensory, Abnormal Motor 0 in left delt, bi, tri, 0/5 grip and trace Left hip/knee ext synergy trace L ankle DF,  Other left neglect vs visual loss L eye Musc/Skelno pain with UE or LE ROMOther no pain with UE or LE ROM Gen NAD   Assessment/Plan: 1. Functional deficits secondary to  Left-sided weakness with dysarthria/dysphagia secondary to right basal ganglia corona radiata infarct which require 3+ hours per day of interdisciplinary therapy in a comprehensive inpatient rehab setting. Physiatrist is providing close team supervision and 24 hour management of active medical problems listed below. Physiatrist and rehab team continue to assess barriers to discharge/monitor patient progress toward functional and medical goals. FIM: Function - Bathing Position: Wheelchair/chair at sink Body parts bathed by patient: Left arm, Chest, Abdomen Body parts bathed by helper: Back, Right arm Assist Level:  (Mod A)  Function- Upper Body Dressing/Undressing What is the patient wearing?: Pull over shirt/dress Bra - Perfomed by patient: Thread/unthread right bra strap Bra - Perfomed by helper: Thread/unthread left bra strap, Hook/unhook bra (pull down sports  bra) Pull over shirt/dress - Perfomed by patient: Thread/unthread right sleeve, Put head through opening, Pull shirt over trunk Pull over shirt/dress - Perfomed by helper: Thread/unthread left sleeve Assist Level: Touching or steadying assistance(Pt > 75%) Set up : To obtain clothing/put away Function - Lower Body Dressing/Undressing What is the patient wearing?: Pants, Shoes Position: Sitting EOB Pants- Performed by patient: Thread/unthread right pants leg Pants- Performed by helper: Thread/unthread right pants leg, Thread/unthread left pants leg, Pull pants up/down Non-skid slipper socks- Performed by helper: Don/doff right sock, Don/doff left sock Shoes - Performed by helper: Don/doff right shoe, Don/doff left shoe Assist for footwear: Dependant Assist for lower body dressing:  (maxA)  Function - Toileting Toileting steps completed by patient: Performs perineal hygiene Toileting steps completed by helper: Adjust clothing prior to toileting, Performs perineal hygiene, Adjust clothing after toileting Toileting Assistive Devices: Grab bar or rail Assist level:  (Max A)  Function - Air cabin crew transfer assistive device: Grab bar Assist level to toilet: Maximal assist (Pt 25 - 49%/lift and lower) Assist level from toilet: Maximal assist (Pt 25 - 49%/lift and lower)  Function - Chair/bed transfer Chair/bed transfer method: Squat pivot Chair/bed transfer assist level: Maximal assist (Pt 25 - 49%/lift and lower) Chair/bed transfer assistive device: Bedrails, Armrests Chair/bed transfer details: Tactile cues for weight shifting, Tactile cues for posture, Verbal cues for precautions/safety, Verbal cues for technique, Verbal cues for sequencing, Tactile cues for placement, Manual facilitation for weight shifting  Function - Locomotion: Wheelchair Will patient use wheelchair at discharge?: No Type: Manual Max wheelchair distance: 150 Assist Level: Supervision or verbal  cues Assist Level: Supervision or verbal cues Assist Level:  Supervision or verbal cues Turns around,maneuvers to table,bed, and toilet,negotiates 3% grade,maneuvers on rugs and over doorsills: No Function - Locomotion: Ambulation Assistive device: Hand held assist, Rail in hallway Max distance: 25 Assist level: Maximal assist (Pt 25 - 49%) Assist level: Maximal assist (Pt 25 - 49%) Walk 50 feet with 2 turns activity did not occur: Safety/medical concerns Assist level: Moderate assist (Pt 50 - 74%) Walk 150 feet activity did not occur: Safety/medical concerns Walk 10 feet on uneven surfaces activity did not occur: Safety/medical concerns  Function - Comprehension Comprehension: Auditory Comprehension assistive device: Hearing aids Comprehension assist level: Follows complex conversation/direction with extra time/assistive device  Function - Expression Expression: Verbal Expression assist level: Expresses complex ideas: With extra time/assistive device  Function - Social Interaction Social Interaction assist level: Interacts appropriately 90% of the time - Needs monitoring or encouragement for participation or interaction.  Function - Problem Solving Problem solving assist level: Solves basic 75 - 89% of the time/requires cueing 10 - 24% of the time  Function - Memory Memory assist level: Recognizes or recalls 75 - 89% of the time/requires cueing 10 - 24% of the time Patient normally able to recall (first 3 days only): Current season, Location of own room, That he or she is in a hospital  Medical Problem List and Plan: 1.  Left-sided weakness with dysarthria/dysphagia secondary to right basal ganglia corona radiata infarct- some increased weakness last week but now neuro deficits stable to improving 2.  DVT Prophylaxis/Anticoagulation: Subcutaneous Lovenox. Monitor platelet counts or any signs of bleeding, plt 220K 3. Pain Management: Tylenol as needed 4. Dysphagia. Dysphagia #2  thin liquid diet. Needs supervision 5. Neuropsych: This patient is capable of making decisions on her own behalf. 6. Skin/Wound Care: Routine skin checks 7. Fluids/Electrolytes/Nutrition: Routine I&O's BMET normal 7/12 8. Hypertension. Permissive hypertension. Monitor with increased mobility. Patient on Tenormin 50 mg daily prior to admission. 9. Mood. Resume Lexapro 10 mg daily 10. Hard of hearing. 11. Decreased vision right eye. 12. Hyperlipidemia. Pravachol 13. History of colorectal cancer diagnosed 2010. Status post resection. Presently in remission. Followed by Dr. Alen Blew 14.  Poor sleep, multifactorial will trial oxybutnin at noc for freq, ECHO neg for wall motion abnl, no clinical signs of CHF  LOS (Days) 6 A FACE TO FACE EVALUATION WAS PERFORMED  KIRSTEINS,ANDREW E 10/11/2015, 8:13 AM

## 2015-10-11 NOTE — Progress Notes (Signed)
Physical Therapy Session Note  Patient Details  Name: Karen [name] MRN: [id number] Date of Birth: Oct 09, 1922  Today's Date: 10/11/2015 PT Individual Time: 0900-1000 PT Individual Time Calculation (min): 60 min   Short Term Goals: Week 1:  PT Short Term Goal 1 (Week 1): Pt will perform stand pivot transfer with consistent minA PT Short Term Goal 2 (Week 1): Pt will perform gait with consistent minA x100' PT Short Term Goal 3 (Week 1): Pt will perform ascent/descent 8 3-inch stairs with minA for strengthening and NMR PT Short Term Goal 4 (Week 1): Pt will perform dynamic standing balance x5 min with min guard while engaged in functional BUE task  Skilled Therapeutic Interventions/Progress Updates:    Pt received seated in bed on bedpan; denies pain and agreeable to tx. Requires several minutes on bedpan before having bowel movement, requiring cues to reduce valsalva and straining. Rolling L with S and bedrails while therapist performed hygiene and removed bedpan. BP in supine 141/66 HR 88; in sitting 144/58 HR 101. Supine>sit with modA to facilitate trunk flexion and manage LUE. Changed gowns while BP assessed, totalA for time management. Transfer bed>w/c maxA squat pivot. Scooting in w/c forward/backward with min facilitation for anterior weight shift and R hand placement. Pt scooted forward/backward onto standard chair positioned backward. In sitting, performed dynamic RUE reaching for R weight shift and L trunk shortening, while position in chair facilitated hip abduction and anterior pelvic tilt. Returned to room in w/c totalA; remained seated in w/c with all needs in reach at completion of session.   Therapy Documentation Precautions:  Precautions Precautions: Fall Restrictions Weight Bearing Restrictions: No Vital Signs: Therapy Vitals Pulse Rate: (!) 101 BP: (!) 144/58 mmHg Patient Position (if appropriate): Sitting Pain: Pain Assessment Pain Assessment: No/denies pain   See  Function Navigator for Current Functional Status.   Therapy/Group: Individual Therapy  Luberta Mutter 10/11/2015, 9:59 AM

## 2015-10-11 NOTE — Progress Notes (Signed)
Patient called out stating she couldn't breathe, primary RN in another room; charge RN in to assess patient.  Primary RN into room as charge RN listening to lungs and O2 sat being monitored (lungs clear, O2 97%); primary RN listened to lungs and agreed, same as initial shift assessment. Patient quickly back asleep, no other complaints, will continue to monitor.

## 2015-10-11 NOTE — Progress Notes (Signed)
Occupational Therapy Session Note  Patient Details  Name: RAFELITA BROSSMAN MRN: [id number] Date of Birth: 1922-04-27  Today's Date: 10/11/2015 OT Individual Time: 1015-1100 and 1345-1430 OT Individual Time Calculation (min): 45 min and 45 min    Short Term Goals: Week 1:  OT Short Term Goal 1 (Week 1): Pt will maintain dynamic seated balance with S for improved activity engagement.  OT Short Term Goal 2 (Week 1): Pt will complete UB dressing with supervision. OT Short Term Goal 3 (Week 1): Pt will complete LB dressing using AD with supervision. OT Short Term Goal 4 (Week 1): Pt will perform toilet transfers with Min A.   Skilled Therapeutic Interventions/Progress Updates:    1 of 2) Treatment session with focus on self care skills. Pt reported fatigue at start of session but agreeable to therapy. Pt completed partial body bathing seated in WC at sink with clinician assist. Pt able to verbalize different sequence for UB hemi dressing technique but unable to verbalize or demonstrate today, even partially, due to fatigue and resultant impact of executive functioning. Sit to stand for LB dressing with Max A due to difficulty standing and posterior lean.   Pt left in [facility] with all needs in reach. Encouraged pt to sit up until next session.   2 of 2) Treatment session with focus on seated postural stability. Pt awake and agreeable to therapy. Stand pivot at Max A bed <> WC with verbal cues for pt to alert clinician to initiation of movement.   Pt completed dynamic seated postural activity with Mod A and verbal cues for return to midline in between tasks to improve performance and safety during self care activities. Pt observed to lean/push posteriorly when seated with limited ability to self correct when verbal cues given to do so. Introduced red foam wedge with pt seated at EOM to address and problem solve means to challenge pt posterior lean at seated. Plan to further address.   Pt transferred back  to bed with alarm set and all needs in reach.   Therapy Documentation Precautions:  Precautions Precautions: Fall Restrictions Weight Bearing Restrictions: No General:   Vital Signs: Therapy Vitals Pulse Rate: (!) 101 BP: (!) 144/58 mmHg Patient Position (if appropriate): Sitting Pain: Pain Assessment Pain Assessment: No/denies pain  See Function Navigator for Current Functional Status.   Therapy/Group: Individual Therapy  Dierdre Searles 10/11/2015, 12:11 PM

## 2015-10-11 NOTE — Progress Notes (Signed)
Speech Language Pathology Daily Session Note  Patient Details  Name: Karen [name] MRN: [id number] Date of Birth: April 08, 1922  Today's Date: 10/11/2015 SLP Individual Time: 1130-1156 SLP Individual Time Calculation (min): 26 min  Short Term Goals: Week 1: SLP Short Term Goal 1 (Week 1): STG=LTG due to anticipated short duration of ST services.    Skilled Therapeutic Interventions:  Pt was seen for skilled ST targeting goals for dysphagia and communication. SLP facilitated the session with trials of dys 3 solids and thin liquids to continue working towards diet progression.  Pt was able to clear residual solids from left buccal cavity with manual assist.  Pt demonstrated x1 immediate reflexive cough following straw sip of thin liquids; no other overt s/s of aspiration noted with solids or liquids.  Recommend a repeat trial meal tray of dys 3 textures prior to advancement.  During functional conversations with SLP, pt was mod I for speech intelligibility without perceptible impairment for articulatory precision.   Suspect improved alertness to be impacting function today in comparison to previous therapy sessions.  Pt was left in wheelchair with call bell within reach.  Continue per current plan of care.    Function:  Eating Eating                 Cognition Comprehension Comprehension assist level: Follows basic conversation/direction with no assist  Expression   Expression assist level: Expresses basic needs/ideas: With extra time/assistive device  Social Interaction Social Interaction assist level: Interacts appropriately with others with medication or extra time (anti-anxiety, antidepressant).  Problem Solving Problem solving assist level: Solves basic 90% of the time/requires cueing < 10% of the time  Memory Memory assist level: Recognizes or recalls 90% of the time/requires cueing < 10% of the time    Pain Pain Assessment Pain Assessment: No/denies pain  Therapy/Group:  Individual Therapy  Javaun Dimperio, Selinda Orion 10/11/2015, 3:49 PM

## 2015-10-11 NOTE — Progress Notes (Addendum)
Speech Language Pathology Daily Make-up Session Note  Patient Details  Name: Karen [name] MRN: [id number] Date of Birth: Aug 01, 1922  Today's Date: 10/11/2015 SLP Individual Time:  (make up minutes )1530-1600 SLP Individual Time Calculation (min): 30 min  Short Term Goals: Week 1: SLP Short Term Goal 1 (Week 1): STG=LTG due to anticipated short duration of ST services.    Skilled Therapeutic Interventions: Skilled treatment session focused on addressing speech intelligibility goals. SLP facilitated session by providing Min faded to Supervision level verbal cues to self-monitor and correct verbal errors with a slow pace and over articulation.  Suspect fatigue and not having her hearing aids working were limiting factors in today's session.  Patient overall intelligible with mildly decreased breath support and lisp with /s/ productions.  Continue with current plan of care.    Function:  Cognition Comprehension Comprehension assist level: Follows basic conversation/direction with no assist  Expression   Expression assist level: Expresses basic needs/ideas: With extra time/assistive device  Social Interaction Social Interaction assist level: Interacts appropriately with others with medication or extra time (anti-anxiety, antidepressant).  Problem Solving Problem solving assist level: Solves basic 90% of the time/requires cueing < 10% of the time  Memory Memory assist level: Recognizes or recalls 90% of the time/requires cueing < 10% of the time    Pain Pain Assessment Pain Assessment: No/denies pain  Therapy/Group: Individual Therapy  Carmelia Roller., [place] [id number]  Donna 10/11/2015, 4:25 PM

## 2015-10-12 ENCOUNTER — Inpatient Hospital Stay (HOSPITAL_COMMUNITY): Payer: Medicare Other | Admitting: Physical Therapy

## 2015-10-12 ENCOUNTER — Inpatient Hospital Stay (HOSPITAL_COMMUNITY): Payer: Medicare Other | Admitting: Occupational Therapy

## 2015-10-12 LAB — CREATININE, SERUM
CREATININE: 0.6 mg/dL (ref 0.44–1.00)
GFR calc Af Amer: >60 mL/min (ref >60)

## 2015-10-12 MED ORDER — ATENOLOL 25 MG PO TABS
25.0000 mg | ORAL_TABLET | Freq: Every day | ORAL | Status: DC
  Administered 2015-10-12 – 2015-10-16 (×5): 25 mg via ORAL
  Filled 2015-10-12 (×5): qty 1

## 2015-10-12 NOTE — Progress Notes (Signed)
Occupational Therapy Session Note  Patient Details  Name: CECELIA BUTTA MRN: [id number] Date of Birth: 07-14-1922  Today's Date: 10/12/2015 OT Individual Time: 1000-1100 OT Individual Time Calculation (min): 60 min    Short Term Goals: Week 1:  OT Short Term Goal 1 (Week 1): Pt will maintain dynamic seated balance with S for improved activity engagement.  OT Short Term Goal 2 (Week 1): Pt will complete UB dressing with supervision. OT Short Term Goal 3 (Week 1): Pt will complete LB dressing using AD with supervision. OT Short Term Goal 4 (Week 1): Pt will perform toilet transfers with Min A.   Skilled Therapeutic Interventions/Progress Updates:    Treatment session with focus on dynamic seated postural stability for improved function and safety during ADL performance and overall occupational performance. Pt completed reaching activity seated EOM on wedge to challenge posterior postural lean during tasks with forced pt anterior postural righting reactions. Min A to close CGA for return to midline and upright sitting posture from reaching activity. Pt expressed enjoyment of activity and just right challenge with few rest breaks requested. Continue to address sitting balance during functional tasks with decreased supports and verbal cues.   Therapy Documentation Precautions:  Precautions Precautions: Fall Restrictions Weight Bearing Restrictions: No General:   Vital Signs: Therapy Vitals BP: (!) 164/75 mmHg Patient Position (if appropriate): Sitting Pain: Pain Assessment Pain Assessment: No/denies pain  See Function Navigator for Current Functional Status.   Therapy/Group: Individual Therapy  Dierdre Searles 10/12/2015, 12:05 PM

## 2015-10-12 NOTE — Progress Notes (Signed)
Physical Therapy Session Note  Patient Details  Name: Karen [name] MRN: [id number] Date of Birth: Sep 20, 1922  Today's Date: 10/12/2015 PT Individual Time: 0800-0900 and 1300-1400 PT Individual Time Calculation (min): 60 min and 60 min (total 120 min)   Short Term Goals: Week 1:  PT Short Term Goal 1 (Week 1): Pt will perform stand pivot transfer with consistent minA PT Short Term Goal 2 (Week 1): Pt will perform gait with consistent minA x100' PT Short Term Goal 3 (Week 1): Pt will perform ascent/descent 8 3-inch stairs with minA for strengthening and NMR PT Short Term Goal 4 (Week 1): Pt will perform dynamic standing balance x5 min with min guard while engaged in functional BUE task  Skilled Therapeutic Interventions/Progress Updates:    Tx 1: Pt received seated in bed with handoff from NT to supervise eating breakfast. Therapist provided S for self-feeding with occasional cues to check for pocketing. Pt coughing significantly after drinking coffee at completion of breakfast. Supine>sit with modA for anterior weight shift, trunk flexion and cueing for hand placement/sequencing. Dressing completed EOB with minA for upper body, maxA for lower body. Sit <>stand from EOB with RUE on bed transitioned to back of w/c for stability. Squat pivot transfer x3 during session w/c <>bed/mat table with modA for facilitating anterior weight shift and shifting hips to R. Pt washed mouth at sink with setup A d/t visual impairments and flaccid LUE. Sitting and standing balance with R lateral reaching and weight shifting, reaching for horseshoes. Requires mod/maxA in standing due to decreased activation of LLE, improved when reaching to R side. Returned to room totalA for energy conservation, remained seated in w/c at completion of session, all needs in reach.   Tx 2: Pt received supine in bed, c/o L shoulder pain as below and agreeable to treatment. Supine>sit minA with assist for LLE management. Squat pivot  transfer bed>w/c modA. Gait 1x25' with maxA, cueing for R weight shift to improve LLE clearance. Second gait trial 1x10' and 1x15' with maxA and increased cues for L glute activation during weight bearing. Stand pivot transfer w/c <>flat bed in ADL apartment with maxA with pt fearful of progressing either LE due to poor postural control and decreased initiative to shift weight. Sit <>supine min guard for LLE management, improving motor control to move LLE off EOB to sit up. Returned to room totalA in w/c, remained seated at completion of session all needs in reach.   Therapy Documentation Precautions:  Precautions Precautions: Fall Restrictions Weight Bearing Restrictions: No Vital Signs: Therapy Vitals BP: (!) 180/96 mmHg Patient Position (if appropriate): Lying Pain: Pain Assessment Pain Assessment: 0-10 Pain Score: 6  Pain Type: Acute pain Pain Location: Shoulder Pain Orientation: Left Pain Descriptors / Indicators: Discomfort Pain Frequency: Occasional Pain Onset: Gradual Patients Stated Pain Goal: 2 Pain Intervention(s): Medication (See eMAR) (muscle rub )   See Function Navigator for Current Functional Status.   Therapy/Group: Individual Therapy  Luberta Mutter 10/12/2015, 9:52 AM

## 2015-10-12 NOTE — Progress Notes (Signed)
Subjective/Complaints: Slept better ROS- denies CP, SOB, N/V/D Objective: Vital Signs: Blood pressure 180/96, pulse 75, temperature 97.7 F (36.5 C), temperature source Oral, resp. rate 16, height 5' 7" (1.702 m), weight 69.627 kg (153 lb 8 oz), SpO2 98 %. No results found. Results for orders placed or performed during the hospital encounter of 10/05/15 (from the past 72 hour(s))  Creatinine, serum     Status: None   Collection Time: 10/12/15  5:45 AM  Result Value Ref Range   Creatinine, Ser 0.60 0.44 - 1.00 mg/dL   GFR calc non Af Amer >60 >60 mL/min   GFR calc Af Amer >60 >60 mL/min    Comment: (NOTE) The eGFR has been calculated using the CKD EPI equation. This calculation has not been validated in all clinical situations. eGFR's persistently <60 mL/min signify possible Chronic Kidney Disease.      HEENT: Left eye deviated laterally and upward, no vision, neg JVD Cardio: RRR and no murmur Resp: CTA B/L and unlabored GI: BS positive and NT, ND Extremity:  Pulses positive and No Edema Skin:   Other erythema around IV site Neuro: Alert/Oriented, Normal Sensory, Abnormal Motor 0 in left delt, bi, tri, 0/5 grip and trace Left hip/knee ext synergy trace L ankle DF,  Other left neglect vs visual loss L eye Musc/Skelno pain with UE or LE ROMOther no pain with UE or LE ROM Gen NAD   Assessment/Plan: 1. Functional deficits secondary to  Left-sided weakness with dysarthria/dysphagia secondary to right basal ganglia corona radiata infarct which require 3+ hours per day of interdisciplinary therapy in a comprehensive inpatient rehab setting. Physiatrist is providing close team supervision and 24 hour management of active medical problems listed below. Physiatrist and rehab team continue to assess barriers to discharge/monitor patient progress toward functional and medical goals. FIM: Function - Bathing Position: Wheelchair/chair at sink Body parts bathed by patient: Left arm,  Chest, Abdomen Body parts bathed by helper: Right upper leg, Left upper leg, Right lower leg, Left lower leg, Back, Right arm Bathing not applicable: Front perineal area, Buttocks Assist Level:  (Max A)  Function- Upper Body Dressing/Undressing What is the patient wearing?: Pull over shirt/dress, Bra Bra - Perfomed by patient: Thread/unthread right bra strap Bra - Perfomed by helper: Thread/unthread left bra strap, Hook/unhook bra (pull down sports bra) Pull over shirt/dress - Perfomed by patient: Thread/unthread right sleeve, Put head through opening Pull over shirt/dress - Perfomed by helper: Thread/unthread left sleeve, Pull shirt over trunk Assist Level:  (Mod A) Set up : To obtain clothing/put away Function - Lower Body Dressing/Undressing What is the patient wearing?: Pants Position: Wheelchair/chair at sink Pants- Performed by patient: Thread/unthread right pants leg, Pull pants up/down Pants- Performed by helper: Thread/unthread left pants leg Non-skid slipper socks- Performed by helper: Don/doff right sock, Don/doff left sock Shoes - Performed by helper: Don/doff right shoe, Don/doff left shoe Assist for footwear: Dependant Assist for lower body dressing:  (Mod A)  Function - Toileting Toileting steps completed by patient: Performs perineal hygiene Toileting steps completed by helper: Adjust clothing prior to toileting, Performs perineal hygiene, Adjust clothing after toileting Toileting Assistive Devices: Grab bar or rail Assist level: Two helpers  Function - Air cabin crew transfer assistive device: Grab bar Assist level to toilet: 2 helpers Assist level from toilet: 2 helpers  Function - Chair/bed transfer Chair/bed transfer method: Squat pivot Chair/bed transfer assist level: Maximal assist (Pt 25 - 49%/lift and lower) Chair/bed transfer assistive device: Bedrails, Armrests  Chair/bed transfer details: Tactile cues for weight shifting, Tactile cues for  posture, Verbal cues for precautions/safety, Verbal cues for technique, Verbal cues for sequencing, Tactile cues for placement, Manual facilitation for weight shifting, Tactile cues for initiation  Function - Locomotion: Wheelchair Will patient use wheelchair at discharge?: Yes Type: Manual Max wheelchair distance: 150 Assist Level: Supervision or verbal cues Assist Level: Supervision or verbal cues Assist Level: Supervision or verbal cues Turns around,maneuvers to table,bed, and toilet,negotiates 3% grade,maneuvers on rugs and over doorsills: No Function - Locomotion: Ambulation Assistive device: Hand held assist, Rail in hallway Max distance: 25 Assist level: Maximal assist (Pt 25 - 49%) Assist level: Maximal assist (Pt 25 - 49%) Walk 50 feet with 2 turns activity did not occur: Safety/medical concerns Assist level: Moderate assist (Pt 50 - 74%) Walk 150 feet activity did not occur: Safety/medical concerns Walk 10 feet on uneven surfaces activity did not occur: Safety/medical concerns  Function - Comprehension Comprehension: Auditory Comprehension assistive device: Hearing aids Comprehension assist level: Follows basic conversation/direction with no assist  Function - Expression Expression: Verbal Expression assist level: Expresses basic needs/ideas: With extra time/assistive device  Function - Social Interaction Social Interaction assist level: Interacts appropriately with others with medication or extra time (anti-anxiety, antidepressant).  Function - Problem Solving Problem solving assist level: Solves basic 90% of the time/requires cueing < 10% of the time  Function - Memory Memory assist level: Recognizes or recalls 90% of the time/requires cueing < 10% of the time Patient normally able to recall (first 3 days only): Current season, Location of own room, That he or she is in a hospital  Medical Problem List and Plan: 1.  Left-sided weakness with dysarthria/dysphagia  secondary to right basal ganglia corona radiata infarct- some increased weakness last week but now neuro deficits stable to improving, Team conf in am 2.  DVT Prophylaxis/Anticoagulation: Subcutaneous Lovenox. Monitor platelet counts or any signs of bleeding, plt [id number] 3. Pain Management: Tylenol as needed 4. Dysphagia. Dysphagia #2 thin liquid diet. Needs supervision,intake 95% meals, adequate fluid 5. Neuropsych: This patient is capable of making decisions on her own behalf. 6. Skin/Wound Care: Routine skin checks 7. Fluids/Electrolytes/Nutrition: Routine I&O's BMET normal 7/12 8. Hypertension. Permissive hypertension. Monitor with increased mobility. Patient on Tenormin 50 mg daily prior to admission., will resume at 46m 9. Mood. Resume Lexapro 10 mg daily 10. Hard of hearing. 11. Decreased vision right eye. 12. Hyperlipidemia. Pravachol 13. History of colorectal cancer diagnosed 2010. Status post resection. Presently in remission. Followed by Dr. SAlen [id number].  Poor sleep, multifactorial will trial oxybutnin at noc for freq, slept much better  LOS (Days) 7 A FACE TO FACE EVALUATION WAS PERFORMED  KIRSTEINS,ANDREW E 10/12/2015, 7:51 AM

## 2015-10-13 ENCOUNTER — Inpatient Hospital Stay (HOSPITAL_COMMUNITY): Payer: Medicare Other | Admitting: Physical Therapy

## 2015-10-13 ENCOUNTER — Inpatient Hospital Stay (HOSPITAL_COMMUNITY): Payer: Medicare Other | Admitting: Speech Pathology

## 2015-10-13 ENCOUNTER — Inpatient Hospital Stay (HOSPITAL_COMMUNITY): Payer: Medicare Other | Admitting: Occupational Therapy

## 2015-10-13 NOTE — Progress Notes (Signed)
Subjective/Complaints: Slept better but not as well as previous night ROS- denies CP, SOB, N/V/D Objective: Vital Signs: Blood pressure 152/65, pulse 73, temperature 97.5 F (36.4 C), temperature source Oral, resp. rate 16, height 5' 7" (1.702 m), weight 69.627 kg (153 lb 8 oz), SpO2 95 %. No results found. Results for orders placed or performed during the hospital encounter of 10/05/15 (from the past 72 hour(s))  Creatinine, serum     Status: None   Collection Time: 10/12/15  5:45 AM  Result Value Ref Range   Creatinine, Ser 0.60 0.44 - 1.00 mg/dL   GFR calc non Af Amer >60 >60 mL/min   GFR calc Af Amer >60 >60 mL/min    Comment: (NOTE) The eGFR has been calculated using the CKD EPI equation. This calculation has not been validated in all clinical situations. eGFR's persistently <60 mL/min signify possible Chronic Kidney Disease.      HEENT: Left eye deviated laterally and upward, no vision, neg JVD Cardio: RRR and no murmur Resp: CTA B/L and unlabored GI: BS positive and NT, ND Extremity:  Pulses positive and No Edema Skin:   Other erythema around IV site Neuro: Alert/Oriented, Normal Sensory, Abnormal Motor 0 in left delt, bi, tri, 0/5 grip and trace Left hip/knee ext synergy trace L ankle DF,  Other left neglect vs visual loss L eye Musc/Skelno pain with UE or LE ROMOther no pain with UE or LE ROM Gen NAD   Assessment/Plan: 1. Functional deficits secondary to  Left-sided weakness with dysarthria/dysphagia secondary to right basal ganglia corona radiata infarct which require 3+ hours per day of interdisciplinary therapy in a comprehensive inpatient rehab setting. Physiatrist is providing close team supervision and 24 hour management of active medical problems listed below. Physiatrist and rehab team continue to assess barriers to discharge/monitor patient progress toward functional and medical goals. FIM: Function - Bathing Position: Wheelchair/chair at sink Body parts  bathed by patient: Left arm, Chest, Abdomen Body parts bathed by helper: Right upper leg, Left upper leg, Right lower leg, Left lower leg, Back, Right arm Bathing not applicable: Front perineal area, Buttocks Assist Level:  (Max A)  Function- Upper Body Dressing/Undressing What is the patient wearing?: Pull over shirt/dress Bra - Perfomed by patient: Thread/unthread right bra strap Bra - Perfomed by helper: Thread/unthread left bra strap, Hook/unhook bra (pull down sports bra) Pull over shirt/dress - Perfomed by patient: Thread/unthread right sleeve, Put head through opening, Pull shirt over trunk Pull over shirt/dress - Perfomed by helper: Thread/unthread left sleeve Assist Level: Touching or steadying assistance(Pt > 75%) Set up : To obtain clothing/put away Function - Lower Body Dressing/Undressing What is the patient wearing?: Pants Position: Sitting EOB Pants- Performed by patient: Pull pants up/down Pants- Performed by helper: Thread/unthread left pants leg, Thread/unthread right pants leg Non-skid slipper socks- Performed by helper: Don/doff right sock, Don/doff left sock Shoes - Performed by patient: Don/doff right shoe Shoes - Performed by helper: Don/doff left shoe Assist for footwear: Partial/moderate assist Assist for lower body dressing: Touching or steadying assistance (Pt > 75%)  Function - Toileting Toileting steps completed by patient: Performs perineal hygiene Toileting steps completed by helper: Adjust clothing prior to toileting, Performs perineal hygiene, Adjust clothing after toileting Toileting Assistive Devices: Grab bar or rail Assist level: Two helpers  Function - Air cabin crew transfer assistive device: Grab bar Assist level to toilet: 2 helpers Assist level from toilet: 2 helpers  Function - Chair/bed transfer Chair/bed transfer method: Stand pivot  Chair/bed transfer assist level: Maximal assist (Pt 25 - 49%/lift and lower) Chair/bed  transfer assistive device: Armrests Chair/bed transfer details: Tactile cues for weight shifting, Tactile cues for posture, Verbal cues for precautions/safety, Verbal cues for technique, Verbal cues for sequencing, Tactile cues for placement, Manual facilitation for weight shifting, Tactile cues for initiation  Function - Locomotion: Wheelchair Will patient use wheelchair at discharge?: Yes Type: Manual Max wheelchair distance: 150 Assist Level: Supervision or verbal cues Assist Level: Supervision or verbal cues Assist Level: Supervision or verbal cues Turns around,maneuvers to table,bed, and toilet,negotiates 3% grade,maneuvers on rugs and over doorsills: No Function - Locomotion: Ambulation Assistive device: Walker-rolling Max distance: 25 Assist level: Maximal assist (Pt 25 - 49%) Assist level: Maximal assist (Pt 25 - 49%) Walk 50 feet with 2 turns activity did not occur: Safety/medical concerns Assist level: Moderate assist (Pt 50 - 74%) Walk 150 feet activity did not occur: Safety/medical concerns Walk 10 feet on uneven surfaces activity did not occur: Safety/medical concerns  Function - Comprehension Comprehension: Auditory Comprehension assistive device: Hearing aids Comprehension assist level: Follows basic conversation/direction with no assist  Function - Expression Expression: Verbal Expression assist level: Expresses basic needs/ideas: With extra time/assistive device  Function - Social Interaction Social Interaction assist level: Interacts appropriately with others with medication or extra time (anti-anxiety, antidepressant).  Function - Problem Solving Problem solving assist level: Solves basic 90% of the time/requires cueing < 10% of the time  Function - Memory Memory assist level: Recognizes or recalls 90% of the time/requires cueing < 10% of the time Patient normally able to recall (first 3 days only): Current season, Location of own room, That he or she is in a  hospital  Medical Problem List and Plan: 1.  Left-sided weakness with dysarthria/dysphagia secondary to right basal ganglia corona radiata infarct- some increased weakness last week but now neuro deficits stable to improving, Team conference today please see physician documentation under team conference tab, met with team face-to-face to discuss problems,progress, and goals. Formulized individual treatment plan based on medical history, underlying problem and comorbidities. 2.  DVT Prophylaxis/Anticoagulation: Subcutaneous Lovenox. no signs of bleeding, plt 220K 3. Pain Management: Tylenol as needed 4. Dysphagia. Dysphagia #2 thin liquid diet. Needs supervision,intake 95% meals, adequate fluid 5. Neuropsych: This patient is capable of making decisions on her own behalf. 6. Skin/Wound Care: Routine skin checks 7. Fluids/Electrolytes/Nutrition: Routine I&O's BMET normal 7/12 8. Hypertension. Permissive hypertension. Monitor with increased mobility. Patient on Tenormin 50 mg daily prior to admission., will resume at 28m Filed Vitals:   10/13/15 0603 10/13/15 0800  BP: 170/82 152/65  Pulse:  73  Temp:    Resp:  16   9. Mood. Resume Lexapro 10 mg daily 10. Hard of hearing. 11. Decreased vision right eye. 12. Hyperlipidemia. Pravachol 13. History of colorectal cancer diagnosed 2010. Status post resection. Presently in remission. Followed by Dr. SAlen [id number].  Poor sleep, multifactorial will trial oxybutnin at noc for freq, slept  better  LOS (Days) 8 A FACE TO FACE EVALUATION WAS PERFORMED  Myanna Ziesmer E 10/13/2015, 9:11 AM

## 2015-10-13 NOTE — Progress Notes (Signed)
Speech Language Pathology Weekly Progress and Session Note  Patient Details  Name: Karen [name] MRN: [id number] Date of Birth: 08-Sep-1922  Beginning of progress report period: October 06, 2015   End of progress report period: October 13, 2015   Today's Date: 10/13/2015 SLP Individual Time: [id number] SLP Individual Time Calculation (min): 43 min  Short Term Goals: Week 1: SLP Short Term Goal 1 (Week 1): STG=LTG due to anticipated short duration of ST services.      New Short Term Goals: Week 2: SLP Short Term Goal 1 (Week 2): Continue working towards mod I LTG for swallowing   Weekly Progress Updates: Pt has made slow gains this reporting period towards meeting long term goals for dysphagia.  Recommend discharging goals for dysarthria due to pt's hearing impairment making it difficult to monitor and correct articulatory precision.  Pt's diet has been upgraded to dys 3 solids with continued thin liquids and full supervision for use of swallowing precautions.  Pt would continue to benefit from skilled ST while inpatient in order to maximize functional independence and reduce burden of care prior to discharge.  Prognosis for diet advancement is good with ongoing ST intervention for management of safe texture progression.  Pt and family education is ongoing.     Intensity: Minumum of 1-2 x/day, 30 to 90 minutes Frequency: 1 to 3 out of 7 days Duration/Length of Stay: anticipated d/c of 7/27 Treatment/Interventions: Cueing hierarchy;Functional tasks;Internal/external aids;Patient/family education;Dysphagia/aspiration precaution training   Daily Session  Skilled Therapeutic Interventions: Pt was seen for skilled ST targeting goals for dysphagia.  SLP facilitated the session with a trial meal tray of dys 3 textures to continue working towards diet progression.  Initially pt demonstrated inconsistent dry cough which did not correlate with any specific textures.  As meal progressed coughing subsided.   Pt required x1 supervision verbal cue to clear pocketing from left buccal cavity.  Recommend advancing pt to dys 3 solids and thin liquids with continued full supervision for use of swallowing precautions.  Pt left in bed with call bell within reach and bed alarm set.  Goals updated on this date to reflect current progress and plan of care.       Function:   Eating Eating   Modified Consistency Diet: Yes Eating Assist Level: Supervision or verbal cues;Set up assist for   Eating Set Up Assist For: Opening containers       Cognition Comprehension Comprehension assist level: Follows basic conversation/direction with extra time/assistive device  Expression   Expression assist level: Expresses basic needs/ideas: With extra time/assistive device  Social Interaction Social Interaction assist level: Interacts appropriately with others with medication or extra time (anti-anxiety, antidepressant).  Problem Solving Problem solving assist level: Solves basic 75 - 89% of the time/requires cueing 10 - 24% of the time  Memory Memory assist level: Recognizes or recalls 75 - 89% of the time/requires cueing 10 - 24% of the time   General    Pain Pain Assessment Pain Assessment: No/denies pain  Therapy/Group: Individual Therapy  Karen [name], Karen [name] 10/13/2015, 4:18 PM

## 2015-10-13 NOTE — Progress Notes (Signed)
Occupational Therapy Weekly Progress Note  Patient Details  Name: Karen [name] MRN: [id number] Date of Birth: 06-01-1922  Beginning of progress report period: October 06, 2015 End of progress report period: October 13, 2015  Today's Date: 10/13/2015 OT Individual Time:  -     Patient has met 0 of 4 short term goals. Pt progress impacted by evolution of CVA. Pt currently requires Mod-Max A for transfers, continues to demonstrate posterior lean at seated and during transfers, and activity tolerance is still limited. Therapy schedule modified to 15/7 to increase pt participation due to high levels of fatigue and low activity tolerance overall. Pt would benefit from hands-on family education sessions in order to more fully prepare pt and care partners post d/c to home with granddaughter.    Patient continues to demonstrate the following deficits: safety awareness, decreased activity endurance, transfers, seated postural endurance, LUE hemiplegia, gross motor impairments in LLE, and therefore will continue to benefit from skilled OT intervention to enhance overall performance with BADL and Reduce care partner burden.   Patient not progressing toward long term goals.  See goal revision..  Plan of care revisions: Mod A transfers and self care skills.  OT Short Term Goals Week 2:  OT Short Term Goal 1 (Week 2): Pt will tolerate unsupported seated posture for 4 minutes for increased performance in self care tasks.  OT Short Term Goal 2 (Week 2): Pt will self correct unsupported seated posture with minimal verbal cues during 15 minute activity.  OT Short Term Goal 3 (Week 2): Pt will complete toilet transfers at Mod A for reduced caregiver burden.  OT Short Term Goal 4 (Week 2): Pt will complete UB dressing at Mod A for reduced caregiver burden.   Skilled Therapeutic Interventions/Progress Updates:      Therapy Documentation Precautions:  Precautions Precautions: Fall Restrictions Weight Bearing  Restrictions: No General:   Vital Signs: Therapy Vitals Temp: 97.7 F (36.5 C) Temp Source: Oral Pulse Rate: 64 Resp: 18 BP: (!) 149/51 mmHg Patient Position (if appropriate): Lying Oxygen Therapy SpO2: 98 % O2 Device: Not Delivered Pain:   ADL:   Exercises:   Other Treatments:    See Function Navigator for Current Functional Status.   Therapy/Group: Individual Therapy  Dierdre Searles 10/13/2015, 4:12 PM

## 2015-10-13 NOTE — Progress Notes (Signed)
Physical Therapy Weekly Progress Note  Patient Details  Name: Karen [name] MRN: [id number] Date of Birth: 12-03-1922  Beginning of progress report period: October 06, 2015 End of progress report period: October 13, 2015  Today's Date: 10/13/2015 PT Individual Time: 1000-1100 PT Individual Time Calculation (min): 60 min   Patient has met 0 of 4 short term goals.  Pt progress limited by evolution of CVA following evaluation. Goals downgraded to modA overall and gait limited to controlled environment with therapy only; anticipate pt will be mostly w/c level at d/c. Currently requires maxA for standing balance, stand pivot transfers, gait with hall rail, variable min/modA for bed mobility. Pt limited by fatigue, limited aerobic endurance, and poor memory/carry over between sessions.   Patient continues to demonstrate the following deficits:  activity tolerance, balance, postural control, ability to compensate for deficits, functional use of  left upper extremity and left lower extremity, attention, awareness and coordination and therefore will continue to benefit from skilled PT intervention to enhance overall performance with bed mobility, transfers, gait, home access and mobility.  Patient progressing slowly towards long term goals following evolution of CVA however signficantly more impaired than at evaluation. .  Plan of care revisions: transfer goals modA overall, gait goal limited to controlled environment with therapy only. Remaining gait and stair goals d/c d/t slow progress..  PT Short Term Goals Week 1:  PT Short Term Goal 1 (Week 1): Pt will perform stand pivot transfer with consistent minA PT Short Term Goal 1 - Progress (Week 1): Not met PT Short Term Goal 2 (Week 1): Pt will perform gait with consistent minA x100' PT Short Term Goal 2 - Progress (Week 1): Not met PT Short Term Goal 3 (Week 1): Pt will perform ascent/descent 8 3-inch stairs with minA for strengthening and NMR PT Short  Term Goal 3 - Progress (Week 1): Not met PT Short Term Goal 4 (Week 1): Pt will perform dynamic standing balance x5 min with min guard while engaged in functional BUE task PT Short Term Goal 4 - Progress (Week 1): Not met Week 2:  PT Short Term Goal 1 (Week 2): =LTG due to estimated LOS; downgraded to modA overall   Skilled Therapeutic Interventions/Progress Updates:    Pt received seated in w/c, denies pain but reports significant fatigue "I didn't sleep well last night like I did the night before". Vitals assessed as below. Stand pivot transfer w/c <>flat bed in ADL apartment with maxA, facilitation for weight shift to improve BLE foot clearance and progression. Sit >supine minA for LLE management, supine>sit modA with increased assist for LLE management and trunk control compared to yesterday's session, likely d/t significant fatigue. Stand pivot transfer x4 w/c <>mat table and standard chair with maxA reduced to [place] with repetition. Sitting balance with wedge under LLE to facilitate L trunk shortening and activation while performing R lateral reaching. Returned to bed stand pivot as above, sit >supine modA. Remained supine in bed with alarm intact and all needs in reach.   Therapy Documentation Precautions:  Precautions Precautions: Fall Restrictions Weight Bearing Restrictions: No  Vital Signs: Therapy Vitals Pulse Rate: 87 BP: (!) 128/59 mmHg Patient Position (if appropriate): Sitting Oxygen Therapy SpO2: 94 % O2 Device: Not Delivered   See Function Navigator for Current Functional Status.  Therapy/Group: Individual Therapy  Luberta Mutter 10/13/2015, 12:09 PM

## 2015-10-13 NOTE — Plan of Care (Signed)
Problem: RH Bathing Goal: LTG Patient will bathe with assist, cues/equipment (OT) LTG: Patient will bathe specified number of body parts with assist with/without cues using equipment (position) (OT)  Due to slow progress towards goals.   Problem: RH Dressing Goal: LTG Patient will perform upper body dressing (OT) LTG Patient will perform upper body dressing with assist, with/without cues (OT).  Due to slow progress towards goals.  Goal: LTG Patient will perform lower body dressing w/assist (OT) LTG: Patient will perform lower body dressing with assist, with/without cues in positioning using equipment (OT)  Due to slow progress toward goals.   Problem: RH Toileting Goal: LTG Patient will perform toileting w/assist, cues/equip (OT) LTG: Patient will perform toiletiing (clothes management/hygiene) with assist, with/without cues using equipment (OT)  Due to slow progress toward goals.   Problem: RH Toilet Transfers Goal: LTG Patient will perform toilet transfers w/assist (OT) LTG: Patient will perform toilet transfers with assist, with/without cues using equipment (OT)  Due to slow progress toward goals.  Problem: RH Tub/Shower Transfers Goal: LTG Patient will perform tub/shower transfers w/assist (OT) LTG: Patient will perform tub/shower transfers with assist, with/without cues using equipment (OT)  Due to slow progress toward goals.   Problem: RH Balance Goal: LTG Patient will maintain dynamic standing with ADLs (OT) LTG: Patient will maintain dynamic standing balance with assist during activities of daily living (OT)  Due to slow progress toward goals.

## 2015-10-13 NOTE — Plan of Care (Signed)
Problem: RH Expression Communication Goal: LTG Patient will increase speech intelligibility (SLP) LTG: Patient will increase speech intelligibility at word/phrase/conversation level with cues, % of the time (SLP)  Outcome: Not Applicable Date Met:  26/71/24 Discharge goal due to hard of hearing resulting in inability to monitor and correct verbal errors.

## 2015-10-13 NOTE — Progress Notes (Signed)
Occupational Therapy Session Note  Patient Details  Name: Karen [name] MRN: [id number] Date of Birth: December 01, 1922  Today's Date: 10/13/2015 OT Individual Time: [id number] OT Individual Time Calculation (min): 60 min    Short Term Goals: Week 1:  OT Short Term Goal 1 (Week 1): Pt will maintain dynamic seated balance with S for improved activity engagement.  OT Short Term Goal 2 (Week 1): Pt will complete UB dressing with supervision. OT Short Term Goal 3 (Week 1): Pt will complete LB dressing using AD with supervision. OT Short Term Goal 4 (Week 1): Pt will perform toilet transfers with Min A.   Skilled Therapeutic Interventions/Progress Updates:    Treatment session with focus on self care skills. Toilet <> WC transfer and WC <> tub bench transfer at Max A. Pt completed showering tasks with Mod-Max A sit to stand using grab bars to wash buttocks and assist for back, LUE, and BLEs/feet. Pt demonstrates improved seated postural stability at bench in shower with intermittent hip/back extension during transfers.   Pt verbalizes physical fatigue post shower with verbalized requests for showering and dressing assist which pt reports are available from granddaughter at home. Plan to discuss assist levels available to pt with granddaughter in order to determine occupational performance priorities with patient and caregiver to guide remaining therapy sessions.   Continue to address seated postural stability for improved performance and engagement in valued occupations.   Therapy Documentation Precautions:  Precautions Precautions: Fall Restrictions Weight Bearing Restrictions: No General:   Vital Signs: Therapy Vitals Pulse Rate: 87 BP: (!) 128/59 mmHg Patient Position (if appropriate): Sitting Oxygen Therapy SpO2: 94 % O2 Device: Not Delivered Pain:   Pt with no c/o pain.   See Function Navigator for Current Functional Status.   Therapy/Group: Individual Therapy  Dierdre Searles 10/13/2015, 12:15 PM

## 2015-10-13 NOTE — Plan of Care (Signed)
Problem: RH Balance Goal: LTG Patient will maintain dynamic standing balance (PT) LTG: Patient will maintain dynamic standing balance with assistance during mobility activities (PT)  Downgraded d/t slow progress, evolution of CVA since eval  Problem: RH Bed Mobility Goal: LTG Patient will perform bed mobility with assist (PT) LTG: Patient will perform bed mobility with assistance, with/without cues (PT).  Downgraded d/t slow progress, evolution of CVA since eval  Problem: RH Bed to Chair Transfers Goal: LTG Patient will perform bed/chair transfers w/assist (PT) LTG: Patient will perform bed/chair transfers with assistance, with/without cues (PT).  Downgraded d/t slow progress, evolution of CVA since eval  Problem: RH Car Transfers Goal: LTG Patient will perform car transfers with assist (PT) LTG: Patient will perform car transfers with assistance (PT).  Downgraded d/t slow progress, evolution of CVA since eval  Problem: RH Furniture Transfers Goal: LTG Patient will perform furniture transfers w/assist (OT/PT LTG: Patient will perform furniture transfers with assistance (OT/PT).  Downgraded d/t slow progress, evolution of CVA since eval  Problem: RH Ambulation Goal: LTG Patient will ambulate in controlled environment (PT) LTG: Patient will ambulate in a controlled environment, # of feet with assistance (PT).  Downgraded d/t slow progress, evolution of CVA since eval Goal: LTG Patient will ambulate in community environment (PT) LTG: Patient will ambulate in community environment, # of feet with assistance (PT).  Outcome: Not Applicable Date Met:  95/32/02 D/c goal d/t slow progress, evolution of CVA since eval  Problem: RH Stairs Goal: LTG Patient will ambulate up and down stairs w/assist (PT) LTG: Patient will ambulate up and down # of stairs with assistance (PT)  Outcome: Not Applicable Date Met:  33/43/56 D/c goal d/t slow progress, evolution of CVA since eval

## 2015-10-14 ENCOUNTER — Inpatient Hospital Stay (HOSPITAL_COMMUNITY): Payer: Medicare Other | Admitting: Physical Therapy

## 2015-10-14 ENCOUNTER — Inpatient Hospital Stay (HOSPITAL_COMMUNITY): Payer: Medicare Other | Admitting: Speech Pathology

## 2015-10-14 ENCOUNTER — Inpatient Hospital Stay (HOSPITAL_COMMUNITY): Payer: Medicare Other | Admitting: Occupational Therapy

## 2015-10-14 NOTE — Progress Notes (Signed)
Speech Language Pathology Daily Session Note  Patient Details  Name: Karen [name] MRN: [id number] Date of Birth: January 29, 1923  Today's Date: 10/14/2015 SLP Individual Time: 1130-1200 SLP Individual Time Calculation (min): 30 min  Short Term Goals: Week 2: SLP Short Term Goal 1 (Week 2): Continue working towards mod I LTG for swallowing   Skilled Therapeutic Interventions:  Pt was seen for skilled ST targeting goals for dysphagia.  SLP facilitated the session with skilled observations completed during presentations of her currently prescribed diet to monitor toleration of recent upgrade.  Pt demonstrated x1 immediate cough with initial bite of broccoli.  No other overt s/s of aspiration evident with solids or liquids.  Pt able to clear pocketing from oral cavity with supervision cues.  Recommend that pt remain on dys 3 solids and thin liquids with full supervision for use of swallowing precautions.    Function:  Eating Eating   Modified Consistency Diet: Yes Eating Assist Level: Supervision or verbal cues;Set up assist for   Eating Set Up Assist For: Opening containers       Cognition Comprehension Comprehension assist level: Follows basic conversation/direction with no assist  Expression   Expression assist level: Expresses basic needs/ideas: With extra time/assistive device  Social Interaction Social Interaction assist level: Interacts appropriately with others with medication or extra time (anti-anxiety, antidepressant).  Problem Solving Problem solving assist level: Solves basic 75 - 89% of the time/requires cueing 10 - 24% of the time  Memory Memory assist level: Recognizes or recalls 90% of the time/requires cueing < 10% of the time    Pain Pain Assessment Pain Assessment: No/denies pain  Therapy/Group: Individual Therapy  Ninel Abdella, Selinda Orion 10/14/2015, 3:35 PM

## 2015-10-14 NOTE — Progress Notes (Signed)
Occupational Therapy Session Note  Patient Details  Name: Karen [name] MRN: [id number] Date of Birth: 08/19/1922  Today's Date: 10/14/2015 OT Individual Time: 0900-0930 OT Individual Time Calculation (min): 30 min   Short Term Goals: Week 1:  OT Short Term Goal 1 (Week 1): Pt will maintain dynamic seated balance with S for improved activity engagement.  OT Short Term Goal 1 - Progress (Week 1): Progressing toward goal OT Short Term Goal 2 (Week 1): Pt will complete UB dressing with supervision. OT Short Term Goal 2 - Progress (Week 1): Progressing toward goal OT Short Term Goal 3 (Week 1): Pt will complete LB dressing using AD with supervision. OT Short Term Goal 3 - Progress (Week 1): Discontinued (comment) (Due to evolution of stroke. ) OT Short Term Goal 4 (Week 1):  (Due to evolution of stroke. ) OT Short Term Goal 4 - Progress (Week 1): Discontinued (comment)   Week 2:  OT Short Term Goal 1 (Week 2): Pt will tolerate unsupported seated posture for 4 minutes for increased performance in self care tasks.  OT Short Term Goal 2 (Week 2): Pt will self correct unsupported seated posture with minimal verbal cues during 15 minute activity.  OT Short Term Goal 3 (Week 2): Pt will complete toilet transfers at Mod A for reduced caregiver burden.  OT Short Term Goal 4 (Week 2): Pt will complete UB dressing at Mod A for reduced caregiver burden.   Skilled Therapeutic Interventions/Progress Updates:  Patient found supine in bed on bed pan. Pt with no complaints of pain. Pt engaged in bed mobility for therapist to take bed pan out from under her. Therapist assisted with buttock cleansing, then donned new clean brief. Pt then sat EOB with mod assist with HOB flat. Pt sat EOB to don bilateral shoes, requiring assistance for left shoe and min assist for right shoe (assistance with bending over to maintain balance). Pt transferred EOB to w/c with mod assist (stand pivot). Therapist assisted pt to sink  and pt performed grooming tasks of brushing teeth, washing face, and brushing hair; set-up assistance needed and hand over hand to put toothpaste on toothbrush. At end of session, left pt seated in w/c with all needs within reach and quick release belt donned.   Therapy Documentation Precautions:  Precautions Precautions: Fall Restrictions Weight Bearing Restrictions: No  Vital Signs: Therapy Vitals Temp: 97.2 F (36.2 C) Temp Source: Oral Pulse Rate: 68 Resp: 16 BP: (!) 172/53 mmHg Patient Position (if appropriate): Lying Oxygen Therapy SpO2: 99 % O2 Device: Not Delivered  See Function Navigator for Current Functional Status.  Therapy/Group: Individual Therapy   Chrys Racer , MS, OTR/L, CLT  10/14/2015, 9:36 AM

## 2015-10-14 NOTE — Progress Notes (Signed)
Occupational Therapy Session Note  Patient Details  Name: Karen [name] MRN: [id number] Date of Birth: 11-Apr-1922  Today's Date: 10/14/2015 OT Individual Time: 1002-1102 OT Individual Time Calculation (min): 60 min    Short Term Goals: Week 2:  OT Short Term Goal 1 (Week 2): Pt will tolerate unsupported seated posture for 4 minutes for increased performance in self care tasks.  OT Short Term Goal 2 (Week 2): Pt will self correct unsupported seated posture with minimal verbal cues during 15 minute activity.  OT Short Term Goal 3 (Week 2): Pt will complete toilet transfers at Mod A for reduced caregiver burden.  OT Short Term Goal 4 (Week 2): Pt will complete UB dressing at Mod A for reduced caregiver burden.   Skilled Therapeutic Interventions/Progress Updates:    Treatment session with focus on self care skills and improved dynamic seated postural stability. Pt received in [facility] of room, agreeable to therapy. Sit to stand transfers from [facility] <> EOM and bed completed at Max A then progressively eased to Mod A for pt lifting assist.   Pt completed bathing seated in WC at sink with Mod A for task completion. Reviewed pt education on hemi dressing techniques with pt verbalizing reduced recall and trouble with figure ground discrimination of L sleeve aperture during today's session.   Pt completed item placement/retrieval task in anterior shoulder / hip flexion pattern at seated x3 for baseline, on dynamic surface (wedge), and post dynamic surface to gauge efficacy of intervention. Pt demonstrated improved sitting posture at trial 3 compared to baseline after use of dynamic surface to challenge posterior postural lean.   Plan to review assistance available at home with granddaughter post d/c in order to more accurately problem solve occupational performance priority for pt energy expenditure. Continue to challenge seated postural stability.   Pt left in bed with alarm set and all needs in reach.    Therapy Documentation Precautions:  Precautions Precautions: Fall Restrictions Weight Bearing Restrictions: No General:   Vital Signs:  Pain:   Pt with no c/o pain at this time.   See Function Navigator for Current Functional Status.   Therapy/Group: Individual Therapy  Dierdre Searles 10/14/2015, 11:57 AM

## 2015-10-14 NOTE — Progress Notes (Signed)
Subjective/Complaints: Appetite ok, occ cough with meals ROS- denies CP, SOB, N/V/D Objective: Vital Signs: Blood pressure 172/53, pulse 68, temperature 97.2 F (36.2 C), temperature source Oral, resp. rate 16, height '5\' 7"'  (1.702 m), weight 66.86 kg (147 lb 6.4 oz), SpO2 99 %. No results found. Results for orders placed or performed during the hospital encounter of 10/05/15 (from the past 72 hour(s))  Creatinine, serum     Status: None   Collection Time: 10/12/15  5:45 AM  Result Value Ref Range   Creatinine, Ser 0.60 0.44 - 1.00 mg/dL   GFR calc non Af Amer >60 >60 mL/min   GFR calc Af Amer >60 >60 mL/min    Comment: (NOTE) The eGFR has been calculated using the CKD EPI equation. This calculation has not been validated in all clinical situations. eGFR's persistently <60 mL/min signify possible Chronic Kidney Disease.      HEENT: Left eye deviated laterally and upward, no vision, neg JVD Cardio: RRR and no murmur Resp: CTA B/L and unlabored GI: BS positive and NT, ND Extremity:  Pulses positive and No Edema Skin:   Other erythema around IV site Neuro: Alert/Oriented, Normal Sensory, Abnormal Motor 0 in left delt, bi, tri, 0/5 grip and trace Left hip/knee ext synergy trace L ankle DF,  Other left neglect vs visual loss L eye Musc/Skelno pain with UE or LE ROMOther no pain with UE or LE ROM Gen NAD   Assessment/Plan: 1. Functional deficits secondary to  Left-sided weakness with dysarthria/dysphagia secondary to right basal ganglia corona radiata infarct which require 3+ hours per day of interdisciplinary therapy in a comprehensive inpatient rehab setting. Physiatrist is providing close team supervision and 24 hour management of active medical problems listed below. Physiatrist and rehab team continue to assess barriers to discharge/monitor patient progress toward functional and medical goals. FIM: Function - Bathing Position: Shower Body parts bathed by patient: Left arm,  Chest, Abdomen, Front perineal area, Right upper leg, Left upper leg Body parts bathed by helper: Right lower leg, Left lower leg, Back, Right arm, Buttocks Bathing not applicable: Front perineal area, Buttocks Assist Level:  (Mod A )  Function- Upper Body Dressing/Undressing What is the patient wearing?: Pull over shirt/dress Bra - Perfomed by patient: Thread/unthread right bra strap Bra - Perfomed by helper: Thread/unthread left bra strap, Hook/unhook bra (pull down sports bra) Pull over shirt/dress - Perfomed by patient: Thread/unthread right sleeve Pull over shirt/dress - Perfomed by helper: Thread/unthread left sleeve, Put head through opening, Pull shirt over trunk Assist Level:  (Max A) Set up : To obtain clothing/put away Function - Lower Body Dressing/Undressing What is the patient wearing?: Pants, Socks, Shoes Position: Wheelchair/chair at sink Pants- Performed by patient: Thread/unthread right pants leg Pants- Performed by helper: Thread/unthread left pants leg, Pull pants up/down Non-skid slipper socks- Performed by helper: Don/doff right sock, Don/doff left sock Socks - Performed by helper: Don/doff right sock, Don/doff left sock Shoes - Performed by patient: Don/doff right shoe Shoes - Performed by helper: Don/doff right shoe, Don/doff left shoe Assist for footwear: Maximal assist Assist for lower body dressing:  (Max A)  Function - Toileting Toileting steps completed by patient: Performs perineal hygiene Toileting steps completed by helper: Adjust clothing prior to toileting, Performs perineal hygiene Toileting Assistive Devices: Grab bar or rail Assist level: Supervision or verbal cues (Max A)  Function - Air cabin crew transfer assistive device: Grab bar Assist level to toilet: Maximal assist (Pt 25 - 49%/lift and lower)  Assist level from toilet: Maximal assist (Pt 25 - 49%/lift and lower)  Function - Chair/bed transfer Chair/bed transfer method: Stand  pivot Chair/bed transfer assist level: Maximal assist (Pt 25 - 49%/lift and lower) Chair/bed transfer assistive device: Armrests Chair/bed transfer details: Tactile cues for weight shifting, Tactile cues for posture, Verbal cues for precautions/safety, Verbal cues for technique, Verbal cues for sequencing, Tactile cues for placement, Manual facilitation for weight shifting, Tactile cues for initiation  Function - Locomotion: Wheelchair Will patient use wheelchair at discharge?: Yes Type: Manual Max wheelchair distance: 150 Assist Level: Supervision or verbal cues Assist Level: Supervision or verbal cues Assist Level: Supervision or verbal cues Turns around,maneuvers to table,bed, and toilet,negotiates 3% grade,maneuvers on rugs and over doorsills: No Function - Locomotion: Ambulation Assistive device: Walker-rolling Max distance: 25 Assist level: Maximal assist (Pt 25 - 49%) Assist level: Maximal assist (Pt 25 - 49%) Walk 50 feet with 2 turns activity did not occur: Safety/medical concerns Assist level: Moderate assist (Pt 50 - 74%) Walk 150 feet activity did not occur: Safety/medical concerns Walk 10 feet on uneven surfaces activity did not occur: Safety/medical concerns  Function - Comprehension Comprehension: Auditory Comprehension assistive device: Hearing aids Comprehension assist level: Follows basic conversation/direction with no assist  Function - Expression Expression: Verbal Expression assist level: Expresses basic needs/ideas: With extra time/assistive device  Function - Social Interaction Social Interaction assist level: Interacts appropriately with others with medication or extra time (anti-anxiety, antidepressant).  Function - Problem Solving Problem solving assist level: Solves basic 90% of the time/requires cueing < 10% of the time  Function - Memory Memory assist level: Recognizes or recalls 90% of the time/requires cueing < 10% of the time Patient normally  able to recall (first 3 days only): Current season, Location of own room, That he or she is in a hospital  Medical Problem List and Plan: 1.  Left-sided weakness with dysarthria/dysphagia secondary to right basal ganglia corona radiata infarct- some increased weakness last week but now neuro deficits stable to improving, 2.  DVT Prophylaxis/Anticoagulation: Subcutaneous Lovenox. no signs of bleeding, plt 220K 3. Pain Management: Tylenol as needed 4. Dysphagia. Dysphagia #2 thin liquid diet. Needs supervision,intake 95% meals, adequate fluid 5. Neuropsych: This patient is capable of making decisions on her own behalf. 6. Skin/Wound Care: Routine skin checks 7. Fluids/Electrolytes/Nutrition: Routine I&O's BMET normal 7/12 8. Hypertension. Permissive hypertension. Monitor with increased mobility. Patient on Tenormin 50 mg daily prior to admission., will increase to 64m monitor for brady  Filed Vitals:   10/13/15 1408 10/14/15 0501  BP: 149/51 172/53  Pulse: 64 68  Temp: 97.7 F (36.5 C) 97.2 F (36.2 C)  Resp: 18 16   9. Mood. Resume Lexapro 10 mg daily 10. Hard of hearing. 11. Decreased vision right eye. 12. Hyperlipidemia. Pravachol 13. History of colorectal cancer diagnosed 2010. Status post resection. Presently in remission. Followed by Dr. SAlen Blew14.  Poor sleep, multifactorial will trial oxybutnin at noc for freq, slept  better  LOS (Days) 9 A FACE TO FACE EVALUATION WAS PERFORMED  Karen [name] E 10/14/2015, 7:42 AM

## 2015-10-14 NOTE — Progress Notes (Signed)
Physical Therapy Session Note  Patient Details  Name: Karen [name] MRN: [id number] Date of Birth: 08/09/1922  Today's Date: 10/14/2015 PT Individual Time: 1430-1500 PT Individual Time Calculation (min): 30 min   Short Term Goals: Week 2:  PT Short Term Goal 1 (Week 2): =LTG due to estimated LOS; downgraded to modA overall   Skilled Therapeutic Interventions/Progress Updates:    Pt received supine in bed, lethargic however arousable and agreeable to treatment. Supine>sit with maxA and increased time; unable initiate movement LLE to move off EOB without assist. Sitting balance standbyA with occasional posterior LOBs recovered with RUE on bedrail, while therapist donned shoes totalA. Stand pivot transfer with modA bed>w/c, verbal/tactile cues for upright posture to reduce reaching for furniture and improve LLE progression. Pt seated in w/c, assessed BP as below and pt very lethargic. Remainder of PT session held at this time d/t elevated vitals; RN alerted to pt position and vitals.    Therapy Documentation Precautions:  Precautions Precautions: Fall Restrictions Weight Bearing Restrictions: No General: PT Amount of Missed Time (min): 30 Minutes PT Missed Treatment Reason:  (elevated BP) Vital Signs: Therapy Vitals Temp: 97.7 F (36.5 C) Temp Source: Oral Pulse Rate: 65 Resp: 18 BP: (!) 205/70 mmHg Patient Position (if appropriate): Sitting Oxygen Therapy SpO2: 96 % O2 Device: Not Delivered Pain: Pain Assessment Pain Assessment: No/denies pain   See Function Navigator for Current Functional Status.   Therapy/Group: Individual Therapy  Luberta Mutter 10/14/2015, 3:03 PM

## 2015-10-15 ENCOUNTER — Inpatient Hospital Stay (HOSPITAL_COMMUNITY): Payer: Medicare Other | Admitting: Occupational Therapy

## 2015-10-15 ENCOUNTER — Inpatient Hospital Stay (HOSPITAL_COMMUNITY): Payer: Medicare Other | Admitting: Physical Therapy

## 2015-10-15 NOTE — Progress Notes (Signed)
Occupational Therapy Session Note  Patient Details  Name: Karen [name] MRN: [id number] Date of Birth: 1922/11/05  Today's Date: 10/15/2015 OT Individual Time: 1431-1500 OT Individual Time Calculation (min): 29 min    Skilled Therapeutic Interventions/Progress Updates:    Pt participated in tx focusing on improving static and dynamic sitting balance/seated posture via donning/doffing shoes. Pt was asleep during OT arrival and was agreeable to tx. Pt completed supine to sit with Mod A. Pt required bilateral UE support to maintain static sitting balance with facilitated WB through affected UE. Pt completed donning/doffing shoes with education on adaptive strategies at EOB with Mod A for balance support. Pt still exhibits posterior LOBs and requires cues for anterior leaning. Pt also requires cues to distribute weight to left side to maintain balance. Due to the high physical demands of task, pt was transferred to w/c with Mod A stand step to further work on reaching LEs for LB dressing completion. With encouragement and extra time, pt was able to kick L LE forward to assist with footwear. Crossing knee technique was utilized with Mod A required for steadying limb in place. Pt continues to benefit from postural remediation to further decrease level of assistance with LB dressing. At end of session, pt was left in w/c with lap tray and all needs within reach.   Therapy Documentation Precautions:  Precautions Precautions: Fall Restrictions Weight Bearing Restrictions: No General:   Vital Signs: Therapy Vitals Temp: 97.2 F (36.2 C) Temp Source: Axillary Pulse Rate: 64 Resp: 16 BP: (!) 160/47 mmHg Patient Position (if appropriate): Lying Oxygen Therapy SpO2: 95 % O2 Device: Not Delivered Pain: Pt reported no pain   :    See Function Navigator for Current Functional Status.   Therapy/Group: Individual Therapy  Karen [name] 10/15/2015, 3:58 PM

## 2015-10-15 NOTE — Progress Notes (Signed)
Social Work Patient ID: Karen [name], female   DOB: 1923-03-18, 82 y.o.   MRN: [id number]   Lynnda Child, LCSW Social Worker Signed  Patient Care Conference 10/15/2015 12:26 PM    Expand All Collapse All   Inpatient RehabilitationTeam Conference and Plan of Care Update Date: 10/13/2015   Time: 11:05 AM     Patient Name: Karen [name]       Medical Record Number: [id number]  Date of Birth: 10/19/1922 Sex: Female         Room/Bed: 4W17C/4W17C-01 Payor Info: Payor: MEDICARE / Plan: MEDICARE PART A AND B / Product Type: *No Product type* /    Admitting Diagnosis: Rt CVA   Admit Date/Time:  10/05/2015  4:28 PM Admission Comments: No comment available   Primary Diagnosis:  <principal problem not specified> Principal Problem: <principal problem not specified>    Patient Active Problem List     Diagnosis  Date Noted   .  HOH (hard of hearing)     .  Acute ischemic stroke ([place])  10/04/2015   .  Hyperlipidemia  10/04/2015   .  Cerebrovascular accident (CVA) due to occlusion of cerebral artery ([employer])     .  Tachycardia     .  Dysphagia, post-stroke     .  Stroke ([place])  10/03/2015   .  CVA (cerebral infarction)  10/03/2015   .  Hypotension due to drugs     .  HLD (hyperlipidemia)     .  Legally blind     .  Notalgia     .  Fall  04/02/2015   .  Depression  01/26/2015   .  Essential hypertension     .  Aortic valve disorder  11/26/2009   .  NEOPLASM, MALIGNANT, COLON, CECUM  10/30/2008   .  ANEMIA  10/21/2008   .  PERSONAL HX COLONIC POLYPS  10/21/2008   .  GALLSTONES  10/09/2008   .  Backache  06/27/2007   .  OCCLUSION, CENTRAL RETINAL ARTERY  08/14/2006   .  Glaucoma  08/14/2006   .  Diverticulosis of colon (without mention of hemorrhage)  08/14/2006   .  SYNCOPE, HX OF  08/14/2006     Expected Discharge Date: Expected Discharge Date: 10/21/15  Team Members Present: Physician leading conference: Dr. Alysia Penna Social Worker Present: Alfonse Alpers, LCSW Nurse  Present: Heather Roberts, RN PT Present: Kem Parkinson, PT OT Present: Simonne Come, OT SLP Present: Windell Moulding, SLP PPS Coordinator present : Daiva Nakayama, RN, CRRN        Current Status/Progress  Goal  Weekly Team Focus   Medical     dysphagia, LE strength improving  improved endurance  advance to D3 diet    Bowel/Bladder     continent x2 with urgency; LBM 10/13/15   remain cont x2  Continue toileting patient with appropriate devices    Swallow/Nutrition/ Hydration     Dys 2, thin liquids; full supervision   mod I   trials of advanced textures   ADL's     Max A overall   Min-supervision -- plan to downgrade to mod assist with most self-care tasks  Postural stability at seated, ADL retraining, use of AD, LUE NMR, and transfer training   Mobility     following evolution of CVA requires modA transfers, minA sitting balance, maxA gait   S bed mobility transfers, S gait with LRAD- plan to downgrade to min/mod overall  transfers, sitting/standing  balance, gait training    Communication     Mild dysarthria in conversations; mod I   mod I   goal met    Safety/Cognition/ Behavioral Observations    WFL          Pain     Occasional pain treated with PRN tylenol   <2 on 0-10 pain scale  Monitor and treat pain qshift and PRN    Skin     Scattered bruises; bottom pink, blanchable   Remain free from breakdown   Monitor skin qshift     Rehab Goals Patient on target to meet rehab goals: Yes Rehab Goals Revised: pt's goals have been downgraded due to evolution of stroke *See Care Plan and progress notes for long and short-term goals.    Barriers to Discharge:  mobility decline with stroke progression     Possible Resolutions to Barriers:   cont 15/7 schedule with family     Discharge Planning/Teaching Needs:   Pt to return to her granddaughter's home where she lives and is cared for by her granddaughter and dtr.  Pt's family can come for family education next week to make sure they are  comfortable with pt's level of care needed due to goals being downgraded to mod assist in most areas.    Team Discussion:    Pt with evolution of original infarct, with lower body improving now.  Upper body is not yet improving, but oral pocketing is better.  Speech is trying to advance pt's diet to D3 with thin liquids.  Pt is mod to max assist with OT, but now getting some return in her left leg.  Therapists plan to talk with family about energy conservation during family ed. OT and PT goals are mostly mod assist and PT does not expect pt to ambulate much as home, unless she has a lot of assistance.  She will likely use the w/c mainly.  Therapists requesting family education most of next week.  RN to monitor pt's cough.   Revisions to Treatment Plan:    Pt is 15/7 now.    Continued Need for Acute Rehabilitation Level of Care: The patient requires daily medical management by a physician with specialized training in physical medicine and rehabilitation for the following conditions: Daily direction of a multidisciplinary physical rehabilitation program to ensure safe treatment while eliciting the highest outcome that is of practical value to the patient.: Yes Daily medical management of patient stability for increased activity during participation in an intensive rehabilitation regime.: Yes Daily analysis of laboratory values and/or radiology reports with any subsequent need for medication adjustment of medical intervention for : Neurological problems  Breeonna Mone, Silvestre Mesi 10/15/2015, 12:26 PM

## 2015-10-15 NOTE — Progress Notes (Signed)
Physical Therapy Session Note  Patient Details  Name: Karen [name] MRN: [id number] Date of Birth: 05/12/1922  Today's Date: 10/15/2015 PT Individual Time: 1630-1700 PT Individual Time Calculation (min): 30 min   Short Term Goals: Week 1:  PT Short Term Goal 1 (Week 1): Pt will perform stand pivot transfer with consistent minA PT Short Term Goal 1 - Progress (Week 1): Not met PT Short Term Goal 2 (Week 1): Pt will perform gait with consistent minA x100' PT Short Term Goal 2 - Progress (Week 1): Not met PT Short Term Goal 3 (Week 1): Pt will perform ascent/descent 8 3-inch stairs with minA for strengthening and NMR PT Short Term Goal 3 - Progress (Week 1): Not met PT Short Term Goal 4 (Week 1): Pt will perform dynamic standing balance x5 min with min guard while engaged in functional BUE task PT Short Term Goal 4 - Progress (Week 1): Not met Week 2:  PT Short Term Goal 1 (Week 2): =LTG due to estimated LOS; downgraded to modA overall   Skilled Therapeutic Interventions/Progress Updates:    Patient received sitting in [facility] and agreeable to PT.  Patient instructe in transfer training including stand pivot transfer x2 with max A and PT to block L knee to prevent knee buckling/snap back as well as position LLE with step. Patient required max cues for transfer to L for improved posture and weight shifhting to allow improved LLE foot clearance.   Squat pivot transfer x2 with max A from PT with PT to block L knee . Patient demonstrated increased confidence with squat pivot transfer compared to stand pivot. PT provided mod cues for BLE/RUE placement and for increased gluteal clearance from seat to prevent sheer.   PT instructed patient in [facility] mobility with R hemi technique for 197f with supervision A and mod cues for improved use of the R LE to improve steering and prevent veer to L. Patient was noted to have inconsistent improvement unless continued cues were given.   [facility], with education on how to properly call RN with call bell. Patient reports that she had been pressing the wrong button to call RN.   Therapy Documentation Precautions:  Precautions Precautions: Fall Restrictions Weight Bearing Restrictions: No Pain: 0/10    See Function Navigator for Current Functional Status.   Therapy/Group: Individual Therapy  ALorie Phenix7/21/2017, 6:16 PM

## 2015-10-15 NOTE — Progress Notes (Signed)
Social Work Patient ID: Karen [name], female   DOB: 1922-11-21, 83 y.o.   MRN: [id number]   CSW spoke with pt and her granddaughter Karen [name]) to update them on team conference and then talked to pt's dtr to update her.  The family still plans to care of pt at home and will come for family education next week.  CSW awaits days/times that she and [place] will come for training with the therapists and then CSW will ask for pt's schedule to reflect this.  CSW will be out of the hospital next week, but colleagues, Lorre Nick and Jennings Lodge, will assist with f/u therapies and any DME that needs to be ordered.

## 2015-10-15 NOTE — Progress Notes (Signed)
Occupational Therapy Session Note  Patient Details  Name: Karen [name] MRN: [id number] Date of Birth: 02/14/1923  Today's Date: 10/15/2015 OT Individual Time: 0800-0900 OT Individual Time Calculation (min): 60 min    Short Term Goals: Week 2:  OT Short Term Goal 1 (Week 2): Pt will tolerate unsupported seated posture for 4 minutes for increased performance in self care tasks.  OT Short Term Goal 2 (Week 2): Pt will self correct unsupported seated posture with minimal verbal cues during 15 minute activity.  OT Short Term Goal 3 (Week 2): Pt will complete toilet transfers at Mod A for reduced caregiver burden.  OT Short Term Goal 4 (Week 2): Pt will complete UB dressing at Mod A for reduced caregiver burden.   Skilled Therapeutic Interventions/Progress Updates:    1:1 self care retraining at shower level. Pt transitioned to EOB from sitting up with HOB elevated with min A for translating hips forward. Pt perform stand pivot with max A and with total tactile cues for rotation towards chair to be able to sit. Pt finished breakfast with supervision. Transitioned into bathroom and able to transfer to and from toilet and to and from tub bench in shower with mod A stand pivot. Pt did require max cues for maintaining forward weight shift and positioning of LEs prior to standing and with stepping during transfer. Pt with initiation of horizontal, bicep flexion and shoulder elevation in left UE with using it to wash her right UE Active assisted. Pt able to don shirt with min a with mod cuing for attention to left shoulder to adjust clothing over trunk. Pt performed sit to stand at sink for LB clothing management and was able to progress to min  A (pushing up with right hand on arm rest) and cues for maintaining forward weight shift and activating left LE in full upright standing. Once balanced with steadying  A pt able to pull up her pants!. Left in w/c next to bed with call bell.  Therapy  Documentation Precautions:  Precautions Precautions: Fall Restrictions Weight Bearing Restrictions: No Pain: No c/o pain in session  See Function Navigator for Current Functional Status.   Therapy/Group: Individual Therapy  Willeen Cass [facility] 10/15/2015, 8:54 AM

## 2015-10-15 NOTE — Progress Notes (Signed)
Physical Therapy Session Note  Patient Details  Name: DAYANAH DJURIC MRN: [id number] Date of Birth: [birth year]  Today's Date: 10/15/2015 PT Individual Time: 1000-1055 PT Individual Time Calculation (min): 55 min   Short Term Goals: Week 2:  PT Short Term Goal 1 (Week 2): =LTG due to estimated LOS; downgraded to modA overall   Skilled Therapeutic Interventions/Progress Updates:    Pt received seated in w/c, denies pain and reporting significant fatigue however agreeable to treatment. Stand pivot transfer w/c>flat bed in ADL apartment with maxA for upright posture and LLE progression. Sit >supine minA for LLE management. Required several minute rest break in supine d/t fatigue. Supine>sit minA for trunk flexion and managing LUE to improve weight shifting. Squat pivot transfer bed>w/c with modA and mod verbal cues for sequencing and technique. Gait 2x25' with R rail in hall and modA for facilitating weight shift and LLE progression to reduce scissoring. Squat pivot transfer to return to bed with modA. Sit>supine with minA. Pt positioned in R sidelying per pt request, pillows placed for comfort and positioning. Remained in bed with alarm intact at completion of session, all needs in reach.   Therapy Documentation Precautions:  Precautions Precautions: Fall Restrictions Weight Bearing Restrictions: No   See Function Navigator for Current Functional Status.   Therapy/Group: Individual Therapy  Luberta Mutter 10/15/2015, 10:55 AM

## 2015-10-15 NOTE — Patient Care Conference (Signed)
Inpatient RehabilitationTeam Conference and Plan of Care Update Date: 10/13/2015   Time: 11:05 AM    Patient Name: Karen [name]      Medical Record Number: [id number]  Date of Birth: October 20, 1922 Sex: Female         Room/Bed: 4W17C/4W17C-01 Payor Info: Payor: MEDICARE / Plan: MEDICARE PART A AND B / Product Type: *No Product type* /    Admitting Diagnosis: Rt CVA  Admit Date/Time:  10/05/2015  4:28 PM Admission Comments: No comment available   Primary Diagnosis:  <principal problem not specified> Principal Problem: <principal problem not specified>  Patient Active Problem List   Diagnosis Date Noted  . HOH (hard of hearing)   . Acute ischemic stroke ([place]) 10/04/2015  . Hyperlipidemia 10/04/2015  . Cerebrovascular accident (CVA) due to occlusion of cerebral artery ([place])   . Tachycardia   . Dysphagia, post-stroke   . Stroke ([place]) 10/03/2015  . CVA (cerebral infarction) 10/03/2015  . Hypotension due to drugs   . HLD (hyperlipidemia)   . Legally blind   . Notalgia   . Fall 04/02/2015  . Depression 01/26/2015  . Essential hypertension   . Aortic valve disorder 11/26/2009  . NEOPLASM, MALIGNANT, COLON, CECUM 10/30/2008  . ANEMIA 10/21/2008  . PERSONAL HX COLONIC POLYPS 10/21/2008  . GALLSTONES 10/09/2008  . Backache 06/27/2007  . OCCLUSION, CENTRAL RETINAL ARTERY 08/14/2006  . Glaucoma 08/14/2006  . Diverticulosis of colon (without mention of hemorrhage) 08/14/2006  . SYNCOPE, HX OF 08/14/2006    Expected Discharge Date: Expected Discharge Date: 10/21/15  Team Members Present: Physician leading conference: Dr. Alysia Penna Social Worker Present: Alfonse Alpers, LCSW Nurse Present: Heather Roberts, RN PT Present: Kem Parkinson, PT OT Present: Simonne Come, OT SLP Present: Windell Moulding, SLP PPS Coordinator present : Daiva Nakayama, RN, CRRN     Current Status/Progress Goal Weekly Team Focus  Medical   dysphagia, LE strength improving  improved endurance  advance to D3  diet    Bowel/Bladder   continent x2 with urgency; LBM 10/13/15  remain cont x2  Continue toileting patient with appropriate devices   Swallow/Nutrition/ Hydration   Dys 2, thin liquids; full supervision  mod I   trials of advanced textures   ADL's   Max A overall   Min-supervision -- plan to downgrade to mod assist with most self-care tasks  Postural stability at seated, ADL retraining, use of AD, LUE NMR, and transfer training   Mobility   following evolution of CVA requires modA transfers, minA sitting balance, maxA gait   S bed mobility transfers, S gait with LRAD- plan to downgrade to min/mod overall  transfers, sitting/standing balance, gait training   Communication   Mild dysarthria in conversations; mod I   mod I   goal met    Safety/Cognition/ Behavioral Observations  WFL          Pain   Occasional pain treated with PRN tylenol  <2 on 0-10 pain scale  Monitor and treat pain qshift and PRN   Skin   Scattered bruises; bottom pink, blanchable  Remain free from breakdown   Monitor skin qshift     Rehab Goals Patient on target to meet rehab goals: Yes Rehab Goals Revised: pt's goals have been downgraded due to evolution of stroke *See Care Plan and progress notes for long and short-term goals.  Barriers to Discharge: mobility decline with stroke progression    Possible Resolutions to Barriers:  cont 15/7 schedule with family    Discharge  Planning/Teaching Needs:  Pt to return to her granddaughter's home where she lives and is cared for by her granddaughter and dtr.  Pt's family can come for family education next week to make sure they are comfortable with pt's level of care needed due to goals being downgraded to mod assist in most areas.   Team Discussion:  Pt with evolution of original infarct, with lower body improving now.  Upper body is not yet improving, but oral pocketing is better.  Speech is trying to advance pt's diet to D3 with thin liquids.  Pt is mod to max  assist with OT, but now getting some return in her left leg.  Therapists plan to talk with family about energy conservation during family ed. OT and PT goals are mostly mod assist and PT does not expect pt to ambulate much as home, unless she has a lot of assistance.  She will likely use the w/c mainly.  Therapists requesting family education most of next week.  RN to monitor pt's cough.  Revisions to Treatment Plan:  Pt is 15/7 now.   Continued Need for Acute Rehabilitation Level of Care: The patient requires daily medical management by a physician with specialized training in physical medicine and rehabilitation for the following conditions: Daily direction of a multidisciplinary physical rehabilitation program to ensure safe treatment while eliciting the highest outcome that is of practical value to the patient.: Yes Daily medical management of patient stability for increased activity during participation in an intensive rehabilitation regime.: Yes Daily analysis of laboratory values and/or radiology reports with any subsequent need for medication adjustment of medical intervention for : Neurological problems  [name], Silvestre Mesi 10/15/2015, 12:26 PM

## 2015-10-15 NOTE — Progress Notes (Signed)
Subjective/Complaints: Remembers I am MD No pains, asking "what is a mini stroke?" ROS- denies CP, SOB, N/V/D Objective: Vital Signs: Blood pressure 157/64, pulse 63, temperature 98.1 F (36.7 C), temperature source Oral, resp. rate 16, height 5\' 7"  (1.702 m), weight 66.86 kg (147 lb 6.4 oz), SpO2 98 %. No results found. No results found for this or any previous visit (from the past 72 hour(s)).   HEENT: Left eye deviated laterally and upward, no vision, neg JVD Cardio: RRR and no murmur Resp: CTA B/L and unlabored GI: BS positive and NT, ND Extremity:  Pulses positive and No Edema Skin:   Other erythema around IV site Neuro: Alert/Oriented, Normal Sensory, Abnormal Motor 0 in left delt, bi, tri, 0/5 grip and 2- Left hip/knee ext synergy trace L ankle DF,  Other left neglect vs visual loss L eye Musc/Skelno pain with UE or LE ROMOther no pain with UE or LE ROM Gen NAD   Assessment/Plan: 1. Functional deficits secondary to  Left-sided weakness with dysarthria/dysphagia secondary to right basal ganglia corona radiata infarct which require 3+ hours per day of interdisciplinary therapy in a comprehensive inpatient rehab setting. Physiatrist is providing close team supervision and 24 hour management of active medical problems listed below. Physiatrist and rehab team continue to assess barriers to discharge/monitor patient progress toward functional and medical goals. FIM: Function - Bathing Position: Wheelchair/chair at sink Body parts bathed by patient: Chest, Abdomen Body parts bathed by helper: Right arm, Left arm, Back Bathing not applicable: Front perineal area, Buttocks, Right upper leg, Left upper leg, Left lower leg, Right lower leg Assist Level:  (Mod A)  Function- Upper Body Dressing/Undressing What is the patient wearing?: Pull over shirt/dress Bra - Perfomed by patient: Thread/unthread right bra strap Bra - Perfomed by helper: Thread/unthread left bra strap,  Hook/unhook bra (pull down sports bra) Pull over shirt/dress - Perfomed by patient: Thread/unthread right sleeve, Put head through opening Pull over shirt/dress - Perfomed by helper: Thread/unthread left sleeve, Pull shirt over trunk Assist Level:  (Mod A) Set up : To obtain clothing/put away Function - Lower Body Dressing/Undressing What is the patient wearing?: Pants, Socks, Shoes Position: Wheelchair/chair at sink Pants- Performed by patient: Thread/unthread right pants leg Pants- Performed by helper: Thread/unthread left pants leg, Pull pants up/down Non-skid slipper socks- Performed by helper: Don/doff right sock, Don/doff left sock Socks - Performed by helper: Don/doff right sock, Don/doff left sock Shoes - Performed by patient: Don/doff right shoe Shoes - Performed by helper: Don/doff right shoe, Don/doff left shoe Assist for footwear: Maximal assist Assist for lower body dressing:  (Max A)  Function - Toileting Toileting steps completed by patient: Performs perineal hygiene Toileting steps completed by helper: Adjust clothing prior to toileting, Performs perineal hygiene Toileting Assistive Devices: Grab bar or rail Assist level: Supervision or verbal cues  Function Midwife transfer assistive device: Grab bar Assist level to toilet: Maximal assist (Pt 25 - 49%/lift and lower) Assist level from toilet: Maximal assist (Pt 25 - 49%/lift and lower)  Function - Chair/bed transfer Chair/bed transfer method: Stand pivot Chair/bed transfer assist level: Moderate assist (Pt 50 - 74%/lift or lower) Chair/bed transfer assistive device: Mechanical lift Mechanical lift: Stedy Chair/bed transfer details: Tactile cues for weight shifting, Verbal cues for precautions/safety, Verbal cues for technique, Verbal cues for sequencing, Manual facilitation for weight shifting, Tactile cues for initiation, Visual cues/gestures for sequencing  Function - Locomotion:  Wheelchair Will patient use wheelchair at discharge?: Yes  Type: Manual Max wheelchair distance: 150 Assist Level: Supervision or verbal cues Assist Level: Supervision or verbal cues Assist Level: Supervision or verbal cues Turns around,maneuvers to table,bed, and toilet,negotiates 3% grade,maneuvers on rugs and over doorsills: No Function - Locomotion: Ambulation Assistive device: Walker-rolling Max distance: 25 Assist level: Maximal assist (Pt 25 - 49%) Assist level: Maximal assist (Pt 25 - 49%) Walk 50 feet with 2 turns activity did not occur: Safety/medical concerns Assist level: Moderate assist (Pt 50 - 74%) Walk 150 feet activity did not occur: Safety/medical concerns Walk 10 feet on uneven surfaces activity did not occur: Safety/medical concerns  Function - Comprehension Comprehension: Auditory Comprehension assistive device: Hearing aids Comprehension assist level: Follows basic conversation/direction with no assist  Function - Expression Expression: Verbal Expression assist level: Expresses basic needs/ideas: With extra time/assistive device  Function - Social Interaction Social Interaction assist level: Interacts appropriately with others with medication or extra time (anti-anxiety, antidepressant).  Function - Problem Solving Problem solving assist level: Solves basic 75 - 89% of the time/requires cueing 10 - 24% of the time  Function - Memory Memory assist level: Recognizes or recalls 90% of the time/requires cueing < 10% of the time Patient normally able to recall (first 3 days only): Current season, Location of own room, That he or she is in a hospital  Medical Problem List and Plan: 1.  Left-sided weakness with dysarthria/dysphagia secondary to right basal ganglia corona radiata infarct- some increased weakness last week but now neuro deficits LE improving, 2.  DVT Prophylaxis/Anticoagulation: Subcutaneous Lovenox. no signs of bleeding, plt 220K 3. Pain  Management: Tylenol as needed 4. Dysphagia. Dysphagia #2 thin liquid diet. Needs supervision,intake 95% meals, adequate fluid 5. Neuropsych: This patient is capable of making decisions on her own behalf. 6. Skin/Wound Care: Routine skin checks 7. Fluids/Electrolytes/Nutrition: Routine I&O's BMET normal 7/12 8. Hypertension.will start controlling gradually Monitor with increased mobility. Patient on Tenormin 50 mg daily prior to admission., will increase to 50mg  monitor for brady  Filed Vitals:   10/14/15 1512 10/15/15 0501  BP: 160/72 157/64  Pulse:  63  Temp:  98.1 F (36.7 C)  Resp:  16   9. Mood. Resume Lexapro 10 mg daily 10. Hard of hearing. 11. Decreased vision right eye. 12. Hyperlipidemia. Pravachol 13. History of colorectal cancer diagnosed 2010. Status post resection. Presently in remission. Followed by Dr. Alen Blew 14.  Poor sleep, multifactorial will trial oxybutnin at noc for freq, slept  better  LOS (Days) 10 A FACE TO FACE EVALUATION WAS PERFORMED  Tico Crotteau E 10/15/2015, 7:55 AM

## 2015-10-16 ENCOUNTER — Inpatient Hospital Stay (HOSPITAL_COMMUNITY): Payer: Medicare Other | Admitting: *Deleted

## 2015-10-16 ENCOUNTER — Inpatient Hospital Stay (HOSPITAL_COMMUNITY): Payer: Medicare Other | Admitting: Physical Therapy

## 2015-10-16 NOTE — Progress Notes (Signed)
Physical Therapy Session Note  Patient Details  Name: Karen [name] MRN: [id number] Date of Birth: 1922/09/17  Today's Date: 10/16/2015 PT Individual Time: 1100-1130 (30 min )  Skilled Therapeutic Interventions/Progress Updates:  Patient in w/c in room, agrees to session, but states she is very tired and wants to go back to bed.  Sit to stand in steady x2 with standing up to 1 min with manual assistance for weight distribution and to decrease L side lean.  Sitting EOB x 7 min w/o back support and with reaching for objects to increase sitting balance and overall activity tolerance as well as core strength.  Transfer to supine with max A-mainly due to inability to progress L LE onto the bed. In supine basic ROM exercises in active assisted manner to l LE.  At the end of session patient repositioned in bed and all needs within reach , bed alarm armed.   Therapy Documentation Precautions:  Precautions Precautions: Fall Restrictions Weight Bearing Restrictions: No     See Function Navigator for Current Functional Status.   Therapy/Group: Individual Therapy  [name] 10/16/2015, 12:22 PM

## 2015-10-16 NOTE — Progress Notes (Addendum)
Physical Therapy Session Note  Patient Details  Name: COLLETTE PESCADOR MRN: [id number] Date of Birth: Sep 03, 1922  Today's Date: 10/16/2015  PT Individual Time: [phone] [id number] PT Individual Time Calculation (min): 58 min AND 58 min    Short Term Goals: Week 1:  PT Short Term Goal 1 (Week 1): Pt will perform stand pivot transfer with consistent minA PT Short Term Goal 1 - Progress (Week 1): Not met PT Short Term Goal 2 (Week 1): Pt will perform gait with consistent minA x100' PT Short Term Goal 2 - Progress (Week 1): Not met PT Short Term Goal 3 (Week 1): Pt will perform ascent/descent 8 3-inch stairs with minA for strengthening and NMR PT Short Term Goal 3 - Progress (Week 1): Not met PT Short Term Goal 4 (Week 1): Pt will perform dynamic standing balance x5 min with min guard while engaged in functional BUE task PT Short Term Goal 4 - Progress (Week 1): Not met Week 2:  PT Short Term Goal 1 (Week 2): =LTG due to estimated LOS; downgraded to modA overall   Skilled Therapeutic Interventions/Progress Updates:  Session 1   Patient received supine in bed on bed pan following BM. PT assisted patient with perineal hygiene with Roll to L with min A and heavy use of bed rails. Supine>sit with min A and heavy use of bed rails.   Patient performed Upper body dressing with mod A from PT sitting EOB with occasional min A to maintain sitting balance.   WC mobility training to gym for 159f with supervision A using hemi technique. Mod cues for improved use of R LE to assit with turns to R and improved awareness of obstacles on L to prevent collision on L side.   PT instructed patient in sit<>stand with hemiwalker x 3. PT provided max A on first attempt, mod A and second and third. Standing balance with R UE supported on HW with mod A progressing to supervision  A 3x30 seconds.   Pregait training with with weight shift L and R 2x 10 BLE with HW PT provided occasional min A with weight shift to L and  supervision A to R. PT encouraged increased WB through R UE to prevent posterior LOB. Stand pivot Transfer training with HWx 4 Mod A from PT and max cues for AD management and LE placement.   [facility]    Session 2 Patient received supine in bed and agreeable to PT. Patient performed sit>supine to L side with mod A from PT. Mod A with multimodal cueing to improve reciprocal scooting on EOB to bring feet to floor.   Patient peformed transfer training with sit<>stand at EOB with mod-max A from PT and use of HW. Max multimodal cues provide for improved balance, equal weigh bearing and proper positioning of HW. Sit<>stand trainnig also performed from WC x 5 with HW and mod A  And max A x1 from PT. Max cues for improved equal WB through BLE, proper anterior weight shift, improved use of R UE to push from arm rest and improved posture.  Car transfer with Max A from PT to prevent lateral LOB with HW. Max multi modal cues for LE and AD sequencing as well as proper positioning for UE with push to stand.   PT instructed patient in gait training with HW for 171fwith max A to prevent lateral LOB x 4 and proper LLE limb advancement.  Max cues for sequencing and proper AD mangement. Intermintent assist from PT required to prevent L knee snap back in stance.   Patient performed WC mobility back to room for 174f with supervision A from PT using R hemi technique.   Patient left supine in bed with bed alarm set and call bell in reach following mod A sit>supine transfer.     Therapy Documentation Precautions:  Precautions Precautions: Fall Restrictions Weight Bearing Restrictions: No   Pain: 0/10      See Function Navigator for Current Functional Status.   Therapy/Group: Individual Therapy  ALorie Phenix7/22/2017, 10:02 AM

## 2015-10-16 NOTE — Progress Notes (Signed)
Subjective/Complaints: No new issues, cont of bladder this am Good intake ROS- denies CP, SOB, N/V/D Objective: Vital Signs: Blood pressure 187/54, pulse 64, temperature 98.3 F (36.8 C), temperature source Oral, resp. rate 17, height 5\' 7"  (1.702 m), weight 66.86 kg (147 lb 6.4 oz), SpO2 95 %. No results found. No results found for this or any previous visit (from the past 72 hour(s)).   HEENT: Left eye deviated laterally and upward, no vision, neg JVD Cardio: RRR and no murmur Resp: CTA B/L and unlabored GI: BS positive and NT, ND Extremity:  Pulses positive and No Edema Skin:   Other erythema around IV site Neuro: Alert/Oriented, Normal Sensory, Abnormal Motor 0 in left delt, bi, tri, 0/5 grip and 2- Left hip/knee ext synergy trace L ankle DF,  Other left neglect vs visual loss L eye Musc/Skelno pain with UE or LE ROMOther no pain with UE or LE ROM Gen NAD   Assessment/Plan: 1. Functional deficits secondary to  Left-sided weakness with dysarthria/dysphagia secondary to right basal ganglia corona radiata infarct which require 3+ hours per day of interdisciplinary therapy in a comprehensive inpatient rehab setting. Physiatrist is providing close team supervision and 24 hour management of active medical problems listed below. Physiatrist and rehab team continue to assess barriers to discharge/monitor patient progress toward functional and medical goals. FIM: Function - Bathing Position: Wheelchair/chair at sink Body parts bathed by patient: Chest, Abdomen Body parts bathed by helper: Right arm, Left arm, Back Bathing not applicable: Front perineal area, Buttocks, Right upper leg, Left upper leg, Left lower leg, Right lower leg Assist Level:  (Mod A)  Function- Upper Body Dressing/Undressing What is the patient wearing?: Pull over shirt/dress Bra - Perfomed by patient: Thread/unthread right bra strap Bra - Perfomed by helper: Thread/unthread left bra strap, Hook/unhook bra  (pull down sports bra) Pull over shirt/dress - Perfomed by patient: Thread/unthread right sleeve, Put head through opening Pull over shirt/dress - Perfomed by helper: Thread/unthread left sleeve, Pull shirt over trunk Assist Level:  (Mod A) Set up : To obtain clothing/put away Function - Lower Body Dressing/Undressing What is the patient wearing?: Shoes Position: Wheelchair/chair at sink Pants- Performed by patient: Thread/unthread right pants leg Pants- Performed by helper: Thread/unthread left pants leg, Pull pants up/down Non-skid slipper socks- Performed by helper: Don/doff right sock, Don/doff left sock Socks - Performed by helper: Don/doff right sock, Don/doff left sock Shoes - Performed by patient: Don/doff right shoe Shoes - Performed by helper: Don/doff right shoe, Don/doff left shoe Assist for footwear: Partial/moderate assist Assist for lower body dressing: Touching or steadying assistance (Pt > 75%)  Function - Toileting Toileting steps completed by patient: Performs perineal hygiene Toileting steps completed by helper: Adjust clothing prior to toileting Toileting Assistive Devices: Grab bar or rail Assist level: Supervision or verbal cues  Function Midwife transfer assistive device: Grab bar Assist level to toilet: Maximal assist (Pt 25 - 49%/lift and lower) Assist level from toilet: Maximal assist (Pt 25 - 49%/lift and lower)  Function - Chair/bed transfer Chair/bed transfer method: Squat pivot, Stand pivot Chair/bed transfer assist level: Maximal assist (Pt 25 - 49%/lift and lower) Chair/bed transfer assistive device: Armrests Mechanical lift: Stedy Chair/bed transfer details: Manual facilitation for weight shifting, Manual facilitation for placement, Manual facilitation for weight bearing, Verbal cues for sequencing, Verbal cues for technique, Verbal cues for precautions/safety, Verbal cues for safe use of DME/AE  Function - Locomotion:  Wheelchair Will patient use wheelchair at discharge?:  Yes Type: Manual Max wheelchair distance: 182ft  Assist Level: Supervision or verbal cues Assist Level: Supervision or verbal cues Assist Level: Supervision or verbal cues Turns around,maneuvers to table,bed, and toilet,negotiates 3% grade,maneuvers on rugs and over doorsills: No Function - Locomotion: Ambulation Assistive device: Rail in hallway Max distance: 25 Assist level: Moderate assist (Pt 50 - 74%) Assist level: Moderate assist (Pt 50 - 74%) Walk 50 feet with 2 turns activity did not occur: Safety/medical concerns Assist level: Moderate assist (Pt 50 - 74%) Walk 150 feet activity did not occur: Safety/medical concerns Walk 10 feet on uneven surfaces activity did not occur: Safety/medical concerns  Function - Comprehension Comprehension: Auditory Comprehension assistive device: Hearing aids Comprehension assist level: Follows complex conversation/direction with no assist  Function - Expression Expression: Verbal Expression assist level: Expresses basic needs/ideas: With no assist  Function - Social Interaction Social Interaction assist level: Interacts appropriately with others with medication or extra time (anti-anxiety, antidepressant).  Function - Problem Solving Problem solving assist level: Solves basic 75 - 89% of the time/requires cueing 10 - 24% of the time  Function - Memory Memory assist level: Recognizes or recalls 75 - 89% of the time/requires cueing 10 - 24% of the time Patient normally able to recall (first 3 days only): Current season, Location of own room, That he or she is in a hospital  Medical Problem List and Plan: 1.  Left-sided weakness with dysarthria/dysphagia secondary to right basal ganglia corona radiata infarct- some increased weakness last week but now neuro deficits LE improving, 2.  DVT Prophylaxis/Anticoagulation: Subcutaneous Lovenox. no signs of bleeding, plt 220K 3. Pain Management:  Tylenol as needed 4. Dysphagia. Dysphagia #2 thin liquid diet. Needs supervision,intake 95% meals, adequate fluid 5. Neuropsych: This patient is capable of making decisions on her own behalf. 6. Skin/Wound Care: Routine skin checks 7. Fluids/Electrolytes/Nutrition: Routine I&O's BMET normal 7/12,  8. Hypertension.will start controlling gradually Monitor with increased mobility. Patient on Tenormin 50 mg daily prior to admission., will increase to 50mg  monitor for brady , may need to add second agent     Filed Vitals:   10/15/15 1354 10/16/15 0521  BP: 160/47 187/54  Pulse: 64 64  Temp: 97.2 F (36.2 C) 98.3 F (36.8 C)  Resp: 16 17   9. Mood. Resume Lexapro 10 mg daily 10. Hard of hearing. 11. Decreased vision right eye. 12. Hyperlipidemia. Pravachol 13. History of colorectal cancer diagnosed 2010. Status post resection. Presently in remission. Followed by Dr. Alen Blew 14.  Poor sleep, multifactorial will trial oxybutnin at noc for freq, slept  better  LOS (Days) 11 A FACE TO FACE EVALUATION WAS PERFORMED  Aziel Morgan E 10/16/2015, 8:02 AM

## 2015-10-17 ENCOUNTER — Inpatient Hospital Stay (HOSPITAL_COMMUNITY): Payer: Medicare Other | Admitting: *Deleted

## 2015-10-17 ENCOUNTER — Inpatient Hospital Stay (HOSPITAL_COMMUNITY): Payer: Medicare Other | Admitting: Physical Therapy

## 2015-10-17 ENCOUNTER — Inpatient Hospital Stay (HOSPITAL_COMMUNITY): Payer: Medicare Other | Admitting: Occupational Therapy

## 2015-10-17 MED ORDER — ATENOLOL 50 MG PO TABS
50.0000 mg | ORAL_TABLET | Freq: Every day | ORAL | Status: DC
  Administered 2015-10-17 – 2015-10-19 (×3): 50 mg via ORAL
  Filled 2015-10-17 (×3): qty 1

## 2015-10-17 NOTE — Progress Notes (Addendum)
Physical Therapy Session Note  Patient Details  Name: Karen [name] MRN: [id number] Date of Birth: February 09, 1923  Today's Date: 10/17/2015 PT Individual Time: 1300-1400 PT Individual Time Calculation (min): 60 min     Skilled Therapeutic Interventions/Progress Updates: Patient in bed, agrees to therapy, no complains of pain or discomfort. Transfer to sitting EOB with mod A, stand pivot to w/c with mod to max A.  In therapy gym standing 1 x 1.20 min 1 x 2 min and 1 x 1 min  while separating cards, manual assistance to maintain L knee extension and appropriate weight distribution during standing. Patient gets progressively weaker and complains of discomfort in knees and L shoulder- per inspection noted subluxation- communicated with OT and nursing-sling in room.  Multiple sit to stands and 3 w/c to mat transfers with max A and decreasing participation. Patient requested to return to room and rest in bed. Transfer to bed with max A ,patient does not participate actively in getting from sitting to supine-TD.  Findings communicated with RN.  Therapy Documentation Precautions:  Precautions Precautions: Fall Restrictions Weight Bearing Restrictions: No   See Function Navigator for Current Functional Status.   Therapy/Group: Individual Therapy  [name] 10/17/2015, 3:54 PM

## 2015-10-17 NOTE — Progress Notes (Signed)
Subjective/Complaints: Pt not aware of LE improvements ROS- denies CP, SOB, N/V/D Objective: Vital Signs: Blood pressure (!) 178/67, pulse 70, temperature 98 F (36.7 C), temperature source Oral, resp. rate 17, height 5\' 7"  (1.702 m), weight 66.9 kg (147 lb 6.4 oz), SpO2 98 %. No results found. No results found for this or any previous visit (from the past 72 hour(s)).   HEENT: Left eye deviated laterally and upward, no vision, neg JVD Cardio: RRR and no murmur Resp: CTA B/L and unlabored GI: BS positive and NT, ND Extremity:  Pulses positive and No Edema Skin:   Other erythema around IV site Neuro: Alert/Oriented, Normal Sensory, Abnormal Motor 0 in left delt, bi, tri, 0/5 grip and 3- Left hip/knee ext synergy trace L ankle DF,  Other left neglect vs visual loss L eye Musc/Skelno pain with UE or LE ROMOther no pain with UE or LE ROM Gen NAD   Assessment/Plan: 1. Functional deficits secondary to  Left-sided weakness with dysarthria/dysphagia secondary to right basal ganglia corona radiata infarct which require 3+ hours per day of interdisciplinary therapy in a comprehensive inpatient rehab setting. Physiatrist is providing close team supervision and 24 hour management of active medical problems listed below. Physiatrist and rehab team continue to assess barriers to discharge/monitor patient progress toward functional and medical goals. FIM: Function - Bathing Position: Wheelchair/chair at sink Body parts bathed by patient: Chest, Abdomen Body parts bathed by helper: Right arm, Left arm, Back Bathing not applicable: Front perineal area, Buttocks, Right upper leg, Left upper leg, Left lower leg, Right lower leg Assist Level:  (Mod A)  Function- Upper Body Dressing/Undressing What is the patient wearing?: Pull over shirt/dress Bra - Perfomed by patient: Thread/unthread right bra strap Bra - Perfomed by helper: Thread/unthread left bra strap, Hook/unhook bra (pull down sports  bra) Pull over shirt/dress - Perfomed by patient: Thread/unthread right sleeve, Put head through opening Pull over shirt/dress - Perfomed by helper: Thread/unthread left sleeve, Pull shirt over trunk Assist Level:  (Mod A) Set up : To obtain clothing/put away Function - Lower Body Dressing/Undressing What is the patient wearing?: Shoes Position: Wheelchair/chair at sink Pants- Performed by patient: Thread/unthread right pants leg Pants- Performed by helper: Thread/unthread left pants leg, Pull pants up/down Non-skid slipper socks- Performed by helper: Don/doff right sock, Don/doff left sock Socks - Performed by helper: Don/doff right sock, Don/doff left sock Shoes - Performed by patient: Don/doff right shoe Shoes - Performed by helper: Don/doff right shoe, Don/doff left shoe Assist for footwear: Partial/moderate assist Assist for lower body dressing: Touching or steadying assistance (Pt > 75%)  Function - Toileting Toileting steps completed by patient: Performs perineal hygiene Toileting steps completed by helper: Adjust clothing prior to toileting Toileting Assistive Devices: Grab bar or rail Assist level: Supervision or verbal cues  Function Midwife transfer assistive device: Grab bar Assist level to toilet: Maximal assist (Pt 25 - 49%/lift and lower) Assist level from toilet: Maximal assist (Pt 25 - 49%/lift and lower)  Function - Chair/bed transfer Chair/bed transfer method: Stand pivot Chair/bed transfer assist level: Maximal assist (Pt 25 - 49%/lift and lower) Chair/bed transfer assistive device: Armrests, Walker Mechanical lift: Stedy Chair/bed transfer details: Verbal cues for sequencing, Verbal cues for technique, Verbal cues for precautions/safety, Verbal cues for safe use of DME/AE, Tactile cues for weight shifting, Tactile cues for posture, Tactile cues for placement, Manual facilitation for placement, Visual cues/gestures for  precautions/safety  Function - Locomotion: Wheelchair Will patient use  wheelchair at discharge?: Yes Type: Manual Max wheelchair distance: 135ft Assist Level: Supervision or verbal cues Assist Level: Supervision or verbal cues Assist Level: Supervision or verbal cues Turns around,maneuvers to table,bed, and toilet,negotiates 3% grade,maneuvers on rugs and over doorsills: No Function - Locomotion: Ambulation Assistive device: Walker-hemi Max distance: 74ft Assist level: Maximal assist (Pt 25 - 49%) Assist level: Maximal assist (Pt 25 - 49%) Walk 50 feet with 2 turns activity did not occur: Safety/medical concerns Assist level: Moderate assist (Pt 50 - 74%) Walk 150 feet activity did not occur: Safety/medical concerns Walk 10 feet on uneven surfaces activity did not occur: Safety/medical concerns  Function - Comprehension Comprehension: Auditory Comprehension assistive device: Hearing aids Comprehension assist level: Follows complex conversation/direction with no assist  Function - Expression Expression: Verbal Expression assist level: Expresses basic needs/ideas: With no assist  Function - Social Interaction Social Interaction assist level: Interacts appropriately with others with medication or extra time (anti-anxiety, antidepressant).  Function - Problem Solving Problem solving assist level: Solves basic 75 - 89% of the time/requires cueing 10 - 24% of the time  Function - Memory Memory assist level: Recognizes or recalls 75 - 89% of the time/requires cueing 10 - 24% of the time Patient normally able to recall (first 3 days only): Current season, Location of own room, That he or she is in a hospital  Medical Problem List and Plan: 1.  Left-sided weakness with dysarthria/dysphagia secondary to right basal ganglia corona radiata infarct- some increased weakness last week but now neuro deficits LE improving, 2.  DVT Prophylaxis/Anticoagulation: Subcutaneous Lovenox. no signs of  bleeding, plt 220K 3. Pain Management: Tylenol as needed 4. Dysphagia. Dysphagia #2 thin liquid diet. Needs supervision,intake 95% meals, adequate fluid 5. Neuropsych: This patient is capable of making decisions on her own behalf. 6. Skin/Wound Care: Routine skin checks 7. Fluids/Electrolytes/Nutrition: Routine I&O's BMET normal 7/12,  8. Hypertension.will start controlling gradually Monitor with increased mobility. Patient on Tenormin 50 mg daily prior to admission., will increase to 50mg  monitor for brady , may need to add second agent     Vitals:   10/16/15 1333 10/17/15 0610  BP: (!) 161/58 (!) 178/67  Pulse: 68 70  Resp: 18 17  Temp: 97.8 F (36.6 C) 98 F (36.7 C)   9. Mood. Resume Lexapro 10 mg daily 10. Hard of hearing. 11. Decreased vision right eye. 12. Hyperlipidemia. Pravachol 13. History of colorectal cancer diagnosed 2010. Status post resection. Presently in remission. Followed by Dr. Alen Blew 14.  Poor sleep, multifactorial will trial oxybutnin at noc for freq, slept  better  LOS (Days) 12 A FACE TO FACE EVALUATION WAS PERFORMED  KIRSTEINS,ANDREW E 10/17/2015, 7:33 AM

## 2015-10-17 NOTE — Progress Notes (Signed)
Physical Therapy Session Note  Patient Details  Name: Karen [name] MRN: [id number] Date of Birth: Oct 02, 1922  Today's Date: 10/17/2015 PT Individual Time: 0830-0930 PT Individual Time Calculation (min): 60 min    Short Term Goals: Week 2:  PT Short Term Goal 1 (Week 2): =LTG due to estimated LOS; downgraded to modA overall   Skilled Therapeutic Interventions/Progress Updates:    Pt received seated in bed with no c/o pain and agreeable to treatment; does report significant fatigue which she states is because she is not sleeping well at night. Supine>sit with modA using bedrails and HOB elevated. Squat pivot transfer bed>w/c to R side with modA. Safety plan updated and NT informed of change recommending squat pivot transfers with modA. Upper body dressing minA for threading LUE, lower body dressing with pt threading LLE however required assist to pull up as pt already had shoes on. Sit <>stand to don pants with modA and HW for balance in standing. Seated at sink, pt brushed teeth with setupA d/t visual impairments. Squat pivot transfer w/c <>mat table modA. Sit <>stand from edge of mat table x5 reps; extended rest breaks between each trial. After first trial, shoe lift added to LLE for improving weight shift to R side for midline orientation. Toe taps to 3" step with maxA and 1 tap per foot before pt fatigued. Returned to room totalA; squat pivot to return to bed modA. Pt remained R sidelying at completion of session, pillows placed for comfort and positioning, all needs in reach and alarm intact.    Therapy Documentation Precautions:  Precautions Precautions: Fall Restrictions Weight Bearing Restrictions: No Pain: Pain Assessment Pain Assessment: No/denies pain   See Function Navigator for Current Functional Status.   Therapy/Group: Individual Therapy  Luberta Mutter 10/17/2015, 9:30 AM

## 2015-10-17 NOTE — Progress Notes (Signed)
Occupational Therapy Session Note  Patient Details  Name: Karen [name] MRN: [id number] Date of Birth: 1922-12-18  Today's Date: 10/17/2015 OT Individual Time: [id number] OT Individual Time Calculation (min): 36 min     Short Term Goals: Week 2:  OT Short Term Goal 1 (Week 2): Pt will tolerate unsupported seated posture for 4 minutes for increased performance in self care tasks.  OT Short Term Goal 2 (Week 2): Pt will self correct unsupported seated posture with minimal verbal cues during 15 minute activity.  OT Short Term Goal 3 (Week 2): Pt will complete toilet transfers at Mod A for reduced caregiver burden.  OT Short Term Goal 4 (Week 2): Pt will complete UB dressing at Mod A for reduced caregiver burden.   Skilled Therapeutic Interventions/Progress Updates:   Pt was lying supine in bed upon skilled OT arrival. Per PT report, pt has significant subluxation of L UE that is causing pt pain. Pt was inquired about pain and once seated at EOB subluxation appeared to be approx 2 fingers in length from socket. Pt reported only feeling pain during transfers "(when it's hanging when I'm standing"). Pt donned arm sling with Max A to transfer to w/c. Transfer completed stand pivot with hemi walker and Mod A with manual facilitation of L LE. Pt required vcs for proper hand placement and extra time to facilitate movement in L LE. Pt transferred to toilet from w/c with use of grab bars and Mod A. Pt was able to assist with clothing mgt pre and post toileting with Mod A for balance. Hygiene was completed while seated. Pt completed hand washing with supervision for scanning cues and encouragement to reach for items. Pt returned to bed after tx and repositioned for comfort. All needs within reach and bed alarm set. Family member also present.   Therapy Documentation Precautions:  Precautions Precautions: Fall Restrictions Weight Bearing Restrictions: No General:   Vital Signs: Therapy Vitals Temp:  98.7 F (37.1 C) Temp Source: Oral Pulse Rate: 70 Resp: 20 BP: 139/66 Patient Position (if appropriate): Sitting Oxygen Therapy SpO2: 96 % O2 Device: Not Delivered Pain: No c/o pain during session    See Function Navigator for Current Functional Status.   Therapy/Group: Individual Therapy  Sagar Tengan A Nadean Montanaro 10/17/2015, 4:14 PM

## 2015-10-18 ENCOUNTER — Inpatient Hospital Stay (HOSPITAL_COMMUNITY): Payer: Medicare Other | Admitting: Occupational Therapy

## 2015-10-18 ENCOUNTER — Inpatient Hospital Stay (HOSPITAL_COMMUNITY): Payer: Medicare Other | Admitting: Physical Therapy

## 2015-10-18 MED ORDER — AMLODIPINE BESYLATE 5 MG PO TABS
5.0000 mg | ORAL_TABLET | Freq: Every day | ORAL | Status: DC
  Administered 2015-10-18 – 2015-10-19 (×2): 5 mg via ORAL
  Filled 2015-10-18 (×2): qty 1

## 2015-10-18 NOTE — Progress Notes (Signed)
Occupational Therapy Session Note  Patient Details  Name: Karen [name] MRN: [id number] Date of Birth: 14-Nov-1922  Today's Date: 10/18/2015 OT Individual Time: [id number] OT Individual Time Calculation (min): 61 min     Short Term Goals: Week 2:  OT Short Term Goal 1 (Week 2): Pt will tolerate unsupported seated posture for 4 minutes for increased performance in self care tasks.  OT Short Term Goal 2 (Week 2): Pt will self correct unsupported seated posture with minimal verbal cues during 15 minute activity.  OT Short Term Goal 3 (Week 2): Pt will complete toilet transfers at Mod A for reduced caregiver burden.  OT Short Term Goal 4 (Week 2): Pt will complete UB dressing at Mod A for reduced caregiver burden.   Skilled Therapeutic Interventions/Progress Updates:    Treatment session with focus on self care skills. Stand pivot transfers WC <> tub bench and elevated toilet seat with Mod A. Noted pt still leaning posteriorly during transfers, which require assist/cues to ensure safety such as increased facilitation during transfers, with plan to further address in future sessions. Pt completed showering with Mod A and verbal cues for returning to midline due to posterior and R lateral lean while seated on tub bench. Pt able to correct posture with extra time and intermittent assist dependent upon level of pt fatigue.   UB and LB dressing completed with extra assist this session to problem solve potential strategies for caregiver assist post d/c to reduce burden of care, preserve pt independence, and to improve caregiver ergonomics. Pt completed oral care with set up due to decreased visual acuity.   Continue with current treatment plan. Plan for caregiver training this week with granddaughter Marye Round) for transfers, dressing, and toileting. Pt reports granddaughter broke toe over weekend.   Pt left in [place] with lap tray secure, supportive positioning of LUE, and all needs in reach.   Therapy  Documentation Precautions:  Precautions Precautions: Fall Restrictions Weight Bearing Restrictions: No General:   Vital Signs:  Pain:    Pt with no c/o pain at this time.   See Function Navigator for Current Functional Status.   Therapy/Group: Individual Therapy  Dierdre Searles 10/18/2015, 10:01 AM

## 2015-10-18 NOTE — Progress Notes (Signed)
Occupational Therapy Session Note  Patient Details  Name: Karen [name] MRN: [id number] Date of Birth: 09-21-1922  Today's Date: 10/18/2015 OT Individual Time: 0945-1100 OT Individual Time Calculation (min): 75 min     Short Term Goals:Week 1:  OT Short Term Goal 1 (Week 1): Pt will maintain dynamic seated balance with S for improved activity engagement.  OT Short Term Goal 1 - Progress (Week 1): Progressing toward goal OT Short Term Goal 2 (Week 1): Pt will complete UB dressing with supervision. OT Short Term Goal 2 - Progress (Week 1): Progressing toward goal OT Short Term Goal 3 (Week 1): Pt will complete LB dressing using AD with supervision. OT Short Term Goal 3 - Progress (Week 1): Discontinued (comment) (Due to evolution of stroke. ) OT Short Term Goal 4 (Week 1):  (Due to evolution of stroke. ) OT Short Term Goal 4 - Progress (Week 1): Discontinued (comment) Week 2:  OT Short Term Goal 1 (Week 2): Pt will tolerate unsupported seated posture for 4 minutes for increased performance in self care tasks.  OT Short Term Goal 2 (Week 2): Pt will self correct unsupported seated posture with minimal verbal cues during 15 minute activity.  OT Short Term Goal 3 (Week 2): Pt will complete toilet transfers at Mod A for reduced caregiver burden.  OT Short Term Goal 4 (Week 2): Pt will complete UB dressing at Mod A for reduced caregiver burden.       Skilled Therapeutic Interventions/Progress Updates:    Pt seen for skilled OT to facilitate trunk control in sitting, activity tolerance, transfers and LUE NMR. Pt has been using sling during transfers to avoid L shoulder pain. Educated pt with return demonstration to hold L arm in R during transfers. Pt needs similar amount of A with transfer with and without use of RUE. Pt transferred w/c to mat to work on sitting with upright posture. Pt only tolerated sitting for 10 min then had back pain/fatigue. Transferred to arm chair for support. LUE place on  maxisky on tray table. Tapping over bicep with active elbow flexion of 40* with arm in gravity eliminated position. Active shoulder retraction.  Pt completed 10 reps 3 sets, of elbow flexion.  Pt falling asleep, very fatigued. Transitioned to supine on mat with bolster under knees. Active tricep movement elicited with arm positioned at 90* to shoulder, with hand drop activity. Pt was able to actively avoid hand falling. Slight active extension against gravity. Pt transferred back to w/c and then back to bed, during bed transfer decrepitis heard in pt's upper shoulder/ neck region. Pt stated she felt fine, no complaints.   Pt adjusted in bed with all needs met and bed alarm set.   Therapy Documentation Precautions:  Precautions Precautions: Fall Restrictions Weight Bearing Restrictions: No  Pain: Pain Assessment Pain Assessment: No/denies pain ADL:    See Function Navigator for Current Functional Status.   Therapy/Group: Individual Therapy  [name] 10/18/2015, 12:36 PM

## 2015-10-18 NOTE — Progress Notes (Signed)
Subjective/Complaints: Pt not aware of LE improvements ROS- denies CP, SOB, N/V/D Objective: Vital Signs: Blood pressure (!) 160/60, pulse 70, temperature 98 F (36.7 C), temperature source Oral, resp. rate 20, height 5\' 7"  (1.702 m), weight 66.9 kg (147 lb 6.4 oz), SpO2 98 %. No results found. No results found for this or any previous visit (from the past 72 hour(s)).   HEENT: Left eye deviated laterally and upward, no vision, neg JVD Cardio: RRR and no murmur Resp: CTA B/L and unlabored GI: BS positive and NT, ND Extremity:  Pulses positive and No Edema Skin:   Other erythema around IV site Neuro: Alert/Oriented, Normal Sensory, Abnormal Motor 0 in left delt, bi, tri, 0/5 grip and 3- Left hip/knee ext synergy trace L ankle DF,  Other left neglect vs visual loss L eye Musc/Skel mild left leg swelling, non tender Gen NAD   Assessment/Plan: 1. Functional deficits secondary to  Left-sided weakness with dysarthria/dysphagia secondary to right basal ganglia corona radiata infarct which require 3+ hours per day of interdisciplinary therapy in a comprehensive inpatient rehab setting. Physiatrist is providing close team supervision and 24 hour management of active medical problems listed below. Physiatrist and rehab team continue to assess barriers to discharge/monitor patient progress toward functional and medical goals. FIM: Function - Bathing Position: Wheelchair/chair at sink Body parts bathed by patient: Chest, Abdomen Body parts bathed by helper: Right arm, Left arm, Back Bathing not applicable: Front perineal area, Buttocks, Right upper leg, Left upper leg, Left lower leg, Right lower leg Assist Level:  (Mod A)  Function- Upper Body Dressing/Undressing What is the patient wearing?: Pull over shirt/dress Bra - Perfomed by patient: Thread/unthread right bra strap Bra - Perfomed by helper: Thread/unthread left bra strap, Hook/unhook bra (pull down sports bra) Pull over  shirt/dress - Perfomed by patient: Thread/unthread right sleeve, Put head through opening, Pull shirt over trunk Pull over shirt/dress - Perfomed by helper: Thread/unthread left sleeve Assist Level: Touching or steadying assistance(Pt > 75%) Set up : To obtain clothing/put away Function - Lower Body Dressing/Undressing What is the patient wearing?: Pants Position: Wheelchair/chair at sink Pants- Performed by patient: Thread/unthread left pants leg Pants- Performed by helper: Pull pants up/down, Thread/unthread right pants leg Non-skid slipper socks- Performed by helper: Don/doff right sock, Don/doff left sock Socks - Performed by helper: Don/doff right sock, Don/doff left sock Shoes - Performed by patient: Don/doff right shoe Shoes - Performed by helper: Don/doff right shoe, Don/doff left shoe Assist for footwear: Partial/moderate assist Assist for lower body dressing: Touching or steadying assistance (Pt > 75%)  Function - Toileting Toileting steps completed by patient: Performs perineal hygiene Toileting steps completed by helper: Adjust clothing prior to toileting, Adjust clothing after toileting Toileting Assistive Devices: Grab bar or rail Assist level: Touching or steadying assistance (Pt.75%)  Function - Air cabin crew transfer assistive device: Elevated toilet seat/BSC over toilet, Grab bar Assist level to toilet: Moderate assist (Pt 50 - 74%/lift or lower) Assist level from toilet: Moderate assist (Pt 50 - 74%/lift or lower)  Function - Chair/bed transfer Chair/bed transfer method: Stand pivot Chair/bed transfer assist level: Moderate assist (Pt 50 - 74%/lift or lower) Chair/bed transfer assistive device: Armrests Mechanical lift: Stedy Chair/bed transfer details: Verbal cues for sequencing, Verbal cues for technique, Verbal cues for precautions/safety, Verbal cues for safe use of DME/AE, Tactile cues for weight shifting, Tactile cues for posture, Tactile cues for  placement, Manual facilitation for placement, Visual cues/gestures for precautions/safety  Function -  Locomotion: Wheelchair Will patient use wheelchair at discharge?: Yes Type: Manual Max wheelchair distance: 165ft Assist Level: Supervision or verbal cues Assist Level: Supervision or verbal cues Assist Level: Supervision or verbal cues Turns around,maneuvers to table,bed, and toilet,negotiates 3% grade,maneuvers on rugs and over doorsills: No Function - Locomotion: Ambulation Assistive device: Walker-hemi Max distance: 45ft Assist level: Maximal assist (Pt 25 - 49%) Assist level: Maximal assist (Pt 25 - 49%) Walk 50 feet with 2 turns activity did not occur: Safety/medical concerns Assist level: Moderate assist (Pt 50 - 74%) Walk 150 feet activity did not occur: Safety/medical concerns Walk 10 feet on uneven surfaces activity did not occur: Safety/medical concerns  Function - Comprehension Comprehension: Auditory Comprehension assistive device: Hearing aids Comprehension assist level: Follows basic conversation/direction with extra time/assistive device  Function - Expression Expression: Verbal Expression assist level: Expresses basic needs/ideas: With extra time/assistive device  Function - Social Interaction Social Interaction assist level: Interacts appropriately 90% of the time - Needs monitoring or encouragement for participation or interaction.  Function - Problem Solving Problem solving assist level: Solves basic 75 - 89% of the time/requires cueing 10 - 24% of the time  Function - Memory Memory assist level: Recognizes or recalls 75 - 89% of the time/requires cueing 10 - 24% of the time Patient normally able to recall (first 3 days only): Current season, Location of own room, That he or she is in a hospital  Medical Problem List and Plan: 1.  Left-sided weakness with dysarthria/dysphagia secondary to right basal ganglia corona radiata infarct- some increased weakness  last week but now neuro deficits LE improving, 2.  DVT Prophylaxis/Anticoagulation: Subcutaneous Lovenox. no signs of bleeding, plt 220K 3. Pain Management: Tylenol as needed 4. Dysphagia. Dysphagia #2 thin liquid diet. Needs supervision,intake 95% meals, adequate fluid 5. Neuropsych: This patient is capable of making decisions on her own behalf. 6. Skin/Wound Care: Routine skin checks 7. Fluids/Electrolytes/Nutrition: Routine I&O's BMET normal 7/12,  8. Hypertension.will start controlling gradually Monitor with increased mobility. Patient on Tenormin 50 mg daily prior to admission., will increase to 50mg  monitor for brady ,will add low dose norvasc     Vitals:   10/17/15 1300 10/18/15 0550  BP: 139/66 (!) 160/60  Pulse: 70 70  Resp: 20 20  Temp: 98.7 F (37.1 C) 98 F (36.7 C)   9. Mood. Resume Lexapro 10 mg daily 10. Hard of hearing. 11. Decreased vision right eye. 12. Hyperlipidemia. Pravachol 13. History of colorectal cancer diagnosed 2010. Status post resection. Presently in remission. Followed by Dr. Alen Blew 14.  Poor sleep, multifactorial will trial oxybutnin at noc for freq, slept  Better, side effect of dry mouth  LOS (Days) 13 A FACE TO FACE EVALUATION WAS PERFORMED  Karen [name] 10/18/2015, 7:34 AM

## 2015-10-18 NOTE — Progress Notes (Signed)
Physical Therapy Session Note  Patient Details  Name: Karen [name] MRN: [id number] Date of Birth: January 27, 1923  Today's Date: 10/18/2015 PT Individual Time: 1300-1415 PT Individual Time Calculation (min): 75 min    Short Term Goals: Week 2:  PT Short Term Goal 1 (Week 2): =LTG due to estimated LOS; downgraded to modA overall    Skilled Therapeutic Interventions/Progress Updates:   Pt received supine in bed, lethargic but denies pain and agreeable to treatment. Supine>sit with modA. Family arrived for hands-on training. Demonstrated to daughter squat pivot transfer bed<>w/c with therapist providing modA. Daughter able to repeat demonstration with modA squat pivot, then teach pt's granddaughter who arrived late but is primary caregiver for pt. Pt's granddaughter then performed transfer bed <>w/c with modA. Car transfer x2 trials with daughter and granddaughter each assisting for one trial; stand pivot to car due to tall seat height. Demonstrated to family pt ambulating x25' with hall rail and modA; discussed balance impairments, LLE scissoring, recurvatum and occasional buckling. Stand pivot transfer w/c <>flat bed in ADL apartment; daughter and granddaughter providing modA for transfer and minA for supine <>sit. Shower transfer w/c <>tub bench, however per granddaughter's report, unlikely to fit w/c into bathroom and pt may need to ambulate short distance. Therapist ambulated x5' to/from shower chair with mod/maxA, however family did not attempt at this time. Discussed DME with family; educated regarding pros/cons of hospital bed vs regular bed, regular w/c vs transport chair. Therapist will discuss with CSW to prepare for d/c. Pt propelled w/c 53' with R hemi technique and S; handoff to OT student in hallway with family present.   Therapy Documentation Precautions:  Precautions Precautions: Fall Restrictions Weight Bearing Restrictions: No Pain: Pain Assessment Pain Assessment: No/denies  pain   See Function Navigator for Current Functional Status.   Therapy/Group: Individual Therapy  Luberta Mutter 10/18/2015, 2:19 PM

## 2015-10-19 ENCOUNTER — Inpatient Hospital Stay (HOSPITAL_COMMUNITY): Payer: Medicare Other | Admitting: Occupational Therapy

## 2015-10-19 ENCOUNTER — Inpatient Hospital Stay (HOSPITAL_COMMUNITY): Payer: Medicare Other | Admitting: Physical Therapy

## 2015-10-19 ENCOUNTER — Inpatient Hospital Stay (HOSPITAL_COMMUNITY): Payer: Medicare Other | Admitting: Speech Pathology

## 2015-10-19 LAB — CREATININE, SERUM
CREATININE: 0.55 mg/dL (ref 0.44–1.00)
GFR calc Af Amer: >60 mL/min (ref >60)

## 2015-10-19 NOTE — Progress Notes (Deleted)
Speech Language Pathology Weekly Progress Note  Patient Details  Name: Karen [name] MRN: [id number] Date of Birth: August 29, 1922  Beginning of progress report period: October 13, 2015 End of progress report period: October 19, 2015  Short Term Goals: Week 2: SLP Short Term Goal 1 (Week 2): Continue working towards mod I LTG for swallowing  SLP Short Term Goal 1 - Progress (Week 2): Progressing toward goal    New Short Term Goals: Week 3: SLP Short Term Goal 1 (Week 3): Patient will consume Dys.3 textures and thin liquids with Mod I use of safe swallow strategies to minimize overt s/s of aspiration with PO intake.   Weekly Progress Updates: Patient has made minimal functional gains, but some progress with diet advancement from Dys. 2 textures to Dys.3 textures and some progress toward meeting goal for Mod I use of safe swallow precautions with consumption of Dys.3 textures and thin liquids.  Fatigue and lethargy are limiting factors and patient may be at goal level; however, eduction with caregivers needs to be completed prior to discharge from SLP services given that patient continues to require full supervision assist for safety.  Patient continues to requite short term skilled SLP intervention to maximize safety prior to discharge with 24 hour supervision.       Intensity: Minumum of 1-2 x/day, 30 to 90 minutes Frequency: 1 to 3 out of 7 days Duration/Length of Stay: anticipated d/c of 7/27 Treatment/Interventions: Cueing hierarchy;Functional tasks;Internal/external aids;Patient/family education;Dysphagia/aspiration precaution training   Carmelia Roller., CCC-SLP [id number]  [place] 10/19/2015, 12:03 PM

## 2015-10-19 NOTE — Progress Notes (Signed)
Physical Therapy Session Note  Patient Details  Name: VICTORIA MATTHIAS MRN: [id number] Date of Birth: 1922/08/03  Today's Date: 10/19/2015 PT Individual Time: 1445-1525 PT Individual Time Calculation (min): 40 min    Short Term Goals: Week 2:  PT Short Term Goal 1 (Week 2): =LTG due to estimated LOS; downgraded to modA overall   Skilled Therapeutic Interventions/Progress Updates:   Pt received seated in bed, denies pain and agreeable to treatment. Supine>sit with modA with HOB elevated and bedrails. Squat pivot transfer bed>w/c with modA. Stand pivot transfer w/c <>toilet with modA and grab bars. Pt performed hygiene and therapist managed clothing for energy conservation. W/c propulsion x175' with R hemi technique and occasional short rest breaks, S overall. Remained seated in w/c at completion of session, all needs in reach.   Therapy Documentation Precautions:  Precautions Precautions: Fall Restrictions Weight Bearing Restrictions: No Pain: Pain Assessment Pain Assessment: No/denies pain   See Function Navigator for Current Functional Status.   Therapy/Group: Individual Therapy  Luberta Mutter 10/19/2015, 3:25 PM

## 2015-10-19 NOTE — Progress Notes (Signed)
Subjective/Complaints: Pt tired this am up at 1am to void  ROS- denies CP, SOB, N/V/D Objective: Vital Signs: Blood pressure (!) 179/45, pulse 60, temperature 97.8 F (36.6 C), temperature source Oral, resp. rate 18, height 5' 7" (1.702 m), weight 66.9 kg (147 lb 6.4 oz), SpO2 98 %. No results found. Results for orders placed or performed during the hospital encounter of 10/05/15 (from the past 72 hour(s))  Creatinine, serum     Status: None   Collection Time: 10/19/15  5:21 AM  Result Value Ref Range   Creatinine, Ser 0.55 0.44 - 1.00 mg/dL   GFR calc non Af Amer >60 >60 mL/min   GFR calc Af Amer >60 >60 mL/min    Comment: (NOTE) The eGFR has been calculated using the CKD EPI equation. This calculation has not been validated in all clinical situations. eGFR's persistently <60 mL/min signify possible Chronic Kidney Disease.      HEENT: Left eye deviated laterally and upward, no vision, neg JVD Cardio: RRR and no murmur Resp: CTA B/L and unlabored GI: BS positive and NT, ND Extremity:  Pulses positive and No Edema Skin:   Other erythema around IV site Neuro: Alert/Oriented, Normal Sensory, Abnormal Motor 0 in left delt, bi, tri, 0/5 grip and 3- Left hip/knee ext synergy trace L ankle DF,  Other left neglect vs visual loss L eye Musc/Skel mild left leg swelling, non tender Gen NAD   Assessment/Plan: 1. Functional deficits secondary to  Left-sided weakness with dysarthria/dysphagia secondary to right basal ganglia corona radiata infarct which require 3+ hours per day of interdisciplinary therapy in a comprehensive inpatient rehab setting. Physiatrist is providing close team supervision and 24 hour management of active medical problems listed below. Physiatrist and rehab team continue to assess barriers to discharge/monitor patient progress toward functional and medical goals. FIM: Function - Bathing Position: Shower Body parts bathed by patient: Left arm, Chest, Abdomen,  Front perineal area, Right upper leg, Buttocks, Left upper leg Body parts bathed by helper: Right arm, Right lower leg, Left lower leg, Back Bathing not applicable: Front perineal area, Buttocks, Right upper leg, Left upper leg, Left lower leg, Right lower leg Assist Level:  (mod A)  Function- Upper Body Dressing/Undressing What is the patient wearing?: Pull over shirt/dress Bra - Perfomed by patient: Thread/unthread right bra strap Bra - Perfomed by helper: Thread/unthread left bra strap, Hook/unhook bra (pull down sports bra) Pull over shirt/dress - Perfomed by patient: Thread/unthread right sleeve, Put head through opening Pull over shirt/dress - Perfomed by helper: Thread/unthread left sleeve, Pull shirt over trunk Assist Level:  (mod A) Set up : To obtain clothing/put away Function - Lower Body Dressing/Undressing What is the patient wearing?: Pants, Shoes Position: Wheelchair/chair at sink Pants- Performed by patient: Thread/unthread right pants leg Pants- Performed by helper: Pull pants up/down, Thread/unthread left pants leg Non-skid slipper socks- Performed by helper: Don/doff right sock, Don/doff left sock Socks - Performed by helper: Don/doff right sock, Don/doff left sock Shoes - Performed by patient: Don/doff left shoe Shoes - Performed by helper: Don/doff right shoe Assist for footwear: Partial/moderate assist Assist for lower body dressing:  (mod A)  Function - Toileting Toileting steps completed by patient: Performs perineal hygiene (toileting post shower) Toileting steps completed by helper: Adjust clothing prior to toileting, Adjust clothing after toileting Toileting Assistive Devices: Grab bar or rail Assist level: Touching or steadying assistance (Pt.75%)  Function - Toilet Transfers Toilet transfer assistive device: Elevated toilet seat/BSC over toilet, Grab  bar Assist level to toilet: Moderate assist (Pt 50 - 74%/lift or lower) Assist level from toilet:  Moderate assist (Pt 50 - 74%/lift or lower)  Function - Chair/bed transfer Chair/bed transfer method: Squat pivot Chair/bed transfer assist level: Moderate assist (Pt 50 - 74%/lift or lower) Chair/bed transfer assistive device: Armrests Mechanical lift: Stedy Chair/bed transfer details: Verbal cues for sequencing, Verbal cues for technique, Manual facilitation for weight bearing, Manual facilitation for placement, Manual facilitation for weight shifting, Verbal cues for safe use of DME/AE, Verbal cues for precautions/safety  Function - Locomotion: Wheelchair Will patient use wheelchair at discharge?: Yes Type: Manual Max wheelchair distance: 75 Assist Level: Supervision or verbal cues Assist Level: Supervision or verbal cues Assist Level: Supervision or verbal cues Turns around,maneuvers to table,bed, and toilet,negotiates 3% grade,maneuvers on rugs and over doorsills: No Function - Locomotion: Ambulation Assistive device: Rail in hallway Max distance: 25 Assist level: Moderate assist (Pt 50 - 74%) Assist level: Moderate assist (Pt 50 - 74%) Walk 50 feet with 2 turns activity did not occur: Safety/medical concerns Assist level: Moderate assist (Pt 50 - 74%) Walk 150 feet activity did not occur: Safety/medical concerns Walk 10 feet on uneven surfaces activity did not occur: Safety/medical concerns  Function - Comprehension Comprehension: Auditory Comprehension assistive device: Hearing aids Comprehension assist level: Follows basic conversation/direction with extra time/assistive device  Function - Expression Expression: Verbal Expression assist level: Expresses basic needs/ideas: With extra time/assistive device  Function - Social Interaction Social Interaction assist level: Interacts appropriately 90% of the time - Needs monitoring or encouragement for participation or interaction.  Function - Problem Solving Problem solving assist level: Solves basic 75 - 89% of the  time/requires cueing 10 - 24% of the time  Function - Memory Memory assist level: Recognizes or recalls 75 - 89% of the time/requires cueing 10 - 24% of the time Patient normally able to recall (first 3 days only): Current season, Location of own room, That he or she is in a hospital  Medical Problem List and Plan: 1.  Left-sided weakness with dysarthria/dysphagia secondary to right basal ganglia corona radiata infarct- some increased weakness last week but now neuro deficits LE improving, Discussed D/C date with pt 2.  DVT Prophylaxis/Anticoagulation: Subcutaneous Lovenox. no signs of bleeding, plt 220K 3. Pain Management: Tylenol as needed 4. Dysphagia. Dysphagia #2 thin liquid diet. Needs supervision,intake 95% meals, adequate fluid 5. Neuropsych: This patient is capable of making decisions on her own behalf. 6. Skin/Wound Care: Routine skin checks 7. Fluids/Electrolytes/Nutrition: Routine I&O's BMET normal 7/12,  8. Hypertension.will start controlling gradually Monitor with increased mobility. Patient on Tenormin 50 mg daily prior to admission., will increase to 63m monitor for brady ,will add low dose norvasc, [id number]    Vitals:   10/18/15 1427 10/19/15 0543  BP: (!) 150/57 (!) 179/45  Pulse: 61 60  Resp: 20 18  Temp: 97.5 F (36.4 C) 97.8 F (36.6 C)   9. Mood. Resume Lexapro 10 mg daily 10. Hard of hearing. 11. Decreased vision right eye. 12. Hyperlipidemia. Pravachol 13. History of colorectal cancer diagnosed 2010. Status post resection. Presently in remission. Followed by Dr. ShAlen Blew4.  Poor sleep, multifactorial will trial oxybutnin at noc for freq, overall improved nocturia, side effect of dry mouth  LOS (Days) 14 A FACE TO FACE EVALUATION WAS PERFORMED  KIRSTEINS,ANDREW E 10/19/2015, 7:09 AM

## 2015-10-19 NOTE — Progress Notes (Addendum)
SLP Cancellation Note  Patient Details Name: Karen [name] MRN: [id number] DOB: 1922/11/17   Cancelled treatment:        Patient missed 30 minutes of skilled SLP services due to fatigue.  Upon SLP arrival patient asleep and when SLP attempted to arouse patient she politely declined reporting that she was exhausted and had not slept well last night.  Will follow up as time allows.  Following chart review family education needs to be completed prior to discharge scheduled for 10/20/25.                                                                                            Gunnar Fusi, M.A., [place]  [place] 10/19/2015, 11:49 AM

## 2015-10-19 NOTE — Progress Notes (Signed)
Occupational Therapy Session Note  Patient Details  Name: GRIFFYN CLOTHIER MRN: [id number] Date of Birth: March 26, 1923  Today's Date: 10/19/2015 OT Individual Time: [id number] and 1320-1413 OT Individual Time Calculation (min): 53 min and 53 min     Short Term Goals: Week 2:  OT Short Term Goal 1 (Week 2): Pt will tolerate unsupported seated posture for 4 minutes for increased performance in self care tasks.  OT Short Term Goal 2 (Week 2): Pt will self correct unsupported seated posture with minimal verbal cues during 15 minute activity.  OT Short Term Goal 3 (Week 2): Pt will complete toilet transfers at Mod A for reduced caregiver burden.  OT Short Term Goal 4 (Week 2): Pt will complete UB dressing at Mod A for reduced caregiver burden.   Skilled Therapeutic Interventions/Progress Updates:    1 of 2) Pt asleep seated in WC when clinician entered room and unable to be roused for therapy due to high level of fatigue. However, pt wakened to don clothes at Max A and transferred to bed at Mod A. Sit to lying at Max A with pt in supportive position for hemiplegic side.   2 of 2) Treatment session with focus on education of primary caregiver ([place] - granddaughter) in functional transfers, energy conservation strategies, and simple home modifications. Granddtr arrived late for family education session. Granddtr moved to new home and unsure of WC fit into pt bedroom bathroom. Granddtr's mother to bring bathroom measurements to tomorrow's education session to determine WC fit, any questions unanswered from today's session, and diagram of pt's intended bedroom in granddtr's new home.   Granddtr completed WC <> tub bench transfers, WC <> BSC, and WC > bed transfers under clinician supervision. Verbal cues provided for ergonomic positioning and to encourage lifting with legs during transfer. Granddtr verbalized understanding and amended position accordingly.   Continue with family education tomorrow with  pt dtr, Juliann Pulse, with whom pt will reside initially post d/c due to granddtr being out of town. Address transfers with pt dtr, Janann Colonel, and problem solve questions as they arise in session.   Therapy Documentation Precautions:  Precautions Precautions: Fall Restrictions Weight Bearing Restrictions: No General: General OT Amount of Missed Time: 47 Minutes and 7 minutes  Vital Signs: Therapy Vitals Temp: 98 F (36.7 C) Temp Source: Oral Pulse Rate: (!) 58 Resp: 18 BP: (!) 147/52 Patient Position (if appropriate): Lying Oxygen Therapy SpO2: 96 % O2 Device: Not Delivered Pain:   ADL:   Exercises:   Other Treatments:    See Function Navigator for Current Functional Status.   Therapy/Group: Individual Therapy  Dierdre Searles 10/19/2015, 3:00 PM

## 2015-10-20 ENCOUNTER — Inpatient Hospital Stay (HOSPITAL_COMMUNITY): Payer: Medicare Other | Admitting: Speech Pathology

## 2015-10-20 ENCOUNTER — Inpatient Hospital Stay (HOSPITAL_COMMUNITY): Payer: Medicare Other | Admitting: Occupational Therapy

## 2015-10-20 ENCOUNTER — Inpatient Hospital Stay (HOSPITAL_COMMUNITY): Payer: Medicare Other | Admitting: Physical Therapy

## 2015-10-20 MED ORDER — AMLODIPINE BESYLATE 10 MG PO TABS
10.0000 mg | ORAL_TABLET | Freq: Every day | ORAL | Status: DC
  Administered 2015-10-21: 10 mg via ORAL
  Filled 2015-10-20: qty 1

## 2015-10-20 MED ORDER — ATENOLOL 25 MG PO TABS
25.0000 mg | ORAL_TABLET | Freq: Every day | ORAL | Status: DC
  Administered 2015-10-21: 25 mg via ORAL
  Filled 2015-10-20 (×2): qty 1

## 2015-10-20 NOTE — Progress Notes (Signed)
Speech Language Pathology Discharge Summary  Patient Details  Name: Karen [name] MRN: [id number] Date of Birth: 1922/06/24  Today's Date: 10/20/2015 SLP Individual Time: 1300-1400 SLP Individual Time Calculation (min): 60 min    Skilled Therapeutic Interventions:  Pt observed with regular texture PO trials and thin via straw. Pt was mod I with management including lingual and digital sweep to clear residue. Pt was able to verbalize swallow precautions at mod I level including positioning and meds whole with puree.    Patient has met 3 of 3 long term goals.  Patient to discharge at overall Modified Independent level.  Reasons goals not met:   n/a  Clinical Impression/Discharge Summary:   Pt is tolerating a mechanical soft diet (Dys 3) diet at mod I level for independent management of swallow precautions. Care Partner:  Caregiver Able to Provide Assistance: Yes     Recommendation:  24 hour supervision/assistance        Reasons for discharge: Discharged from hospital   Patient/Family Agrees with Progress Made and Goals Achieved: Yes   Function:  Eating Eating Eating activity did not occur: N/A Modified Consistency Diet: Yes Eating Assist Level: Set up assist for   Eating Set Up Assist For: Opening containers       Cognition Comprehension Comprehension assist level: Understands complex 90% of the time/cues 10% of the time  Expression   Expression assist level: Expresses complex 90% of the time/cues < 10% of the time  Social Interaction Social Interaction assist level: Interacts appropriately 90% of the time - Needs monitoring or encouragement for participation or interaction.  Problem Solving Problem solving assist level: Solves basic 75 - 89% of the time/requires cueing 10 - 24% of the time  Memory Memory assist level: Recognizes or recalls 75 - 89% of the time/requires cueing 10 - 24% of the time   Vinetta Bergamo MA, CCC-SLP 10/20/2015, 2:00 PM

## 2015-10-20 NOTE — Progress Notes (Signed)
Physical Therapy Discharge Summary  Patient Details  Name: Karen [name] MRN: [id number] Date of Birth: 11-07-1922  Today's Date: 10/20/2015 PT Individual Time: 1000-1100 PT Individual Time Calculation (min): 60 min   Patient has met 7 of 7 long term goals due to improved activity tolerance, improved balance, improved postural control, increased strength, ability to compensate for deficits, functional use of  left lower extremity, improved attention, improved awareness and improved coordination.  Patient to discharge at a wheelchair level [place].   Patient's care partner is independent to provide the necessary physical and cognitive assistance at discharge.  Reasons goals not met: All goals met  Recommendation:  Patient will benefit from ongoing skilled PT services in home health setting to continue to advance safe functional mobility, address ongoing impairments in strength, coordination, activity tolerance, and minimize fall risk.  Equipment: w/c  Reasons for discharge: treatment goals met and discharge from hospital  Patient/family agrees with progress made and goals achieved: Yes  PT Discharge Precautions/RestrictionsPrecautions Precautions: Fall Restrictions Weight Bearing Restrictions: No Pain Pain Assessment Pain Assessment: No/denies pain Vision/Perception  Vision - Assessment Additional Comments: Decreased acuity R eye, L eye blind premorbid  Cognition Orientation Level: Oriented X4 Sensation Sensation Light Touch: Appears Intact Stereognosis: Not tested Hot/Cold: Not tested Proprioception: Appears Intact Coordination Gross Motor Movements are Fluid and Coordinated: No Fine Motor Movements are Fluid and Coordinated: No Heel Shin Test: RLE WNL, LLE able to cross foot over R ankle, unable to progress LE up shin due to strength/coordination deficits Motor  Motor Motor: Abnormal postural alignment and control;Hemiplegia;Abnormal tone Motor - Discharge  Observations: L hemiparesis, delayed initiation  Mobility Bed Mobility Bed Mobility: Supine to Sit;Sit to Supine Supine to Sit: 5: Supervision Supine to Sit Details: Verbal cues for technique Sit to Supine: 4: Min assist Sit to Supine - Details: Verbal cues for technique;Verbal cues for precautions/safety;Tactile cues for placement;Tactile cues for weight shifting;Tactile cues for sequencing Sit to Supine - Details (indicate cue type and reason): assist for LLE onto bed Transfers Transfers: Yes Sit to Stand: 4: Min assist Sit to Stand Details: Verbal cues for technique;Tactile cues for weight shifting;Tactile cues for posture Sit to Stand Details (indicate cue type and reason): cues for anterior weight shift, RUE on hall rail to facilitate weight shift Stand Pivot Transfers: 3: Mod assist Squat Pivot Transfers: 3: Mod assist Locomotion  Ambulation Ambulation: Yes Ambulation/Gait Assistance: 4: Min assist Ambulation Distance (Feet): 25 Feet Assistive device: Other (Comment) (rail in hallway with RUE) Ambulation/Gait Assistance Details: Manual facilitation for weight shifting;Verbal cues for technique;Verbal cues for precautions/safety;Verbal cues for sequencing;Tactile cues for posture;Tactile cues for weight shifting Ambulation/Gait Assistance Details: assist for LLE management to prevent scissoring and recurvatum in stance Gait Gait: Yes Gait Pattern: Impaired Gait Pattern: Left genu recurvatum;Scissoring;Trunk flexed;Narrow base of support;Poor foot clearance - left Gait velocity: significantly decreased for age/gender norms Stairs / Additional Locomotion Stairs: Yes Stairs Assistance: 2: Max Industrial/product designer Assistance Details: Verbal cues for gait pattern;Verbal cues for sequencing;Verbal cues for technique;Tactile cues for weight shifting;Tactile cues for posture;Tactile cues for weight beaing;Tactile cues for placement Stairs Assistance Details (indicate cue type and reason):  assist for LLE progression and stance control to reduce recurvatum Stair Management Technique: One rail Right Number of Stairs: 8 Height of Stairs: 3 Wheelchair Mobility Wheelchair Mobility: Yes Wheelchair Assistance: 5: Supervision Wheelchair Propulsion: Right upper extremity;Right lower extremity Wheelchair Parts Management: Needs assistance Distance: 150  Trunk/Postural Assessment  Cervical Assessment Cervical Assessment: Within Functional Limits  Thoracic Assessment Thoracic Assessment: Exceptions to [facility] (increased kyphosis, R trunk shortening, L trunk lengthening) Lumbar Assessment Lumbar Assessment: Exceptions to [facility] (Posterior pelvic tilt, reversal of lumbar lordosis) Postural Control Postural Control: Deficits on evaluation (impaired righting reactions in sitting/standing)  Balance Balance Balance Assessed: Yes Static Sitting Balance Static Sitting - Balance Support: No upper extremity supported;Feet supported Static Sitting - Level of Assistance: 6: Modified independent (Device/Increase time) Dynamic Sitting Balance Dynamic Sitting - Balance Support: Feet supported;Right upper extremity supported;During functional activity Dynamic Sitting - Level of Assistance: 6: Modified independent (Device/Increase time) Dynamic Sitting - Balance Activities: Forward lean/weight shifting;Reaching for objects;Reaching across midline Sitting balance - Comments: posterior LOB with LE activities such as putting shoes on Static Standing Balance Static Standing - Balance Support: Right upper extremity supported Static Standing - Level of Assistance: 4: Min assist Dynamic Standing Balance Dynamic Standing - Balance Support: Right upper extremity supported;During functional activity Dynamic Standing - Level of Assistance: 4: Min assist Dynamic Standing - Balance Activities: Forward lean/weight shifting;Reaching for objects Extremity Assessment  LUE: 2/5 shoulder and elbow, 0/5 wrist and  hand RUE: WFL RLE Assessment RLE Assessment: Within Functional Limits LLE Assessment LLE Assessment: Exceptions to [facility] (hip extension 3/5, knee extension 4-/5, knee flexion 3/5, ankle dorsiflexion 4-/5)  Skilled Therapeutic Intervention: Pt received supine in bed, denies pain and agreeable to treatment. Assessed all mobility as described above with modA overall. Pt performed bed mobility on flat bed with minA for LLE into bed, S supine>sit. Pt reports "This is the best I've felt, I wasn't tired or anything today". Pt educated regarding follow up HHPT, recommendation that pt only ambulate with therapy until balance/coordination improves. Pt requests to return to bed; assisted back to bed squat pivot modA. Remained R sidelying at completion of session, all needs in reach.   See Function Navigator for Current Functional Status.  Benjiman Core Tygielski 10/20/2015, 10:48 AM

## 2015-10-20 NOTE — Progress Notes (Signed)
Social Work Patient ID: Karen [name], female   DOB: 06-04-1922, 82 y.o.   MRN: [id number]   Met with pt an spoke with daughter via telephone to discuss discharge tomorrow and questions. She was working today and not able to come in. She will be here tomorrow at 11:00 for discharge. Home health follow up arranged and equipment to be delivered tomorrow am. Will see in am if any other questions arise.

## 2015-10-20 NOTE — Progress Notes (Signed)
Occupational Therapy Discharge Summary  Patient Details  Name: MAELLE SHEAFFER MRN: [id number] Date of Birth: [birth year]  Today's Date: 10/20/2015 OT Individual Time: 6629-4765 OT Individual Time Calculation (min): 32 min     Patient has met 9 of 9 long term goals due to improved activity tolerance, improved balance, postural control, ability to compensate for deficits and improved awareness.  Patient to discharge at overall Mod Assist level.  Patient's care partner is independent to provide the necessary physical and cognitive assistance at discharge.     Recommendation:  Patient will benefit from ongoing skilled OT services in home health setting to continue to advance functional skills in the area of BADL.  Equipment: No equipment provided. Pt owns recommended BSC.   Reasons for discharge: discharge from hospital  Patient/family agrees with progress made and goals achieved: Yes  OT Discharge Precautions/Restrictions Precautions Precautions: Fall Restrictions Weight Bearing Restrictions: No General   Vital Signs Therapy Vitals Temp: 97.6 F (36.4 C) Temp Source: Oral Pulse Rate: 69 Resp: 18 BP: (!) 154/50 Patient Position (if appropriate): Lying Oxygen Therapy SpO2: 96 % O2 Device: Not Delivered Pain Pain Assessment Pain Assessment: No/denies pain Faces Pain Scale: No hurt ADL   Vision/Perception  Vision- History Baseline Vision/History: Glaucoma Wears Glasses: Reading only Patient Visual Report: No change from baseline Vision- Assessment Additional Comments: Decreased R acuity; L eye blindness premorbid   Cognition Overall Cognitive Status: Within Functional Limits for tasks assessed Arousal/Alertness: Awake/alert Orientation Level: Oriented X4 Attention: Sustained Focused Attention: Appears intact Sustained Attention: Appears intact Memory: Appears intact Awareness: Appears intact Problem Solving: Appears intact Executive Function:  Sequencing Sequencing: Impaired Sequencing Impairment: Verbal basic Safety/Judgment: Health visitor Light Touch: Appears Intact Stereognosis: Not tested Hot/Cold: Not tested Proprioception: Appears Intact Coordination Gross Motor Movements are Fluid and Coordinated: No Fine Motor Movements are Fluid and Coordinated: No Coordination and Movement Description: Delayed initiation of movement from sit to stand.  9 Hole Peg Test: Unable to completed due to L hemiplegia. Motor  Motor Motor: Abnormal postural alignment and control;Hemiplegia;Abnormal tone Motor - Skilled Clinical Observations: inability to find/maintain midline with tendency towards L side, L hemiparesis UE>LE, delayed initation and poor power production Motor - Discharge Observations: L hemiparesis, delayed initiation Mobility     Trunk/Postural Assessment  Cervical Assessment Cervical Assessment: Within Functional Limits Thoracic Assessment Thoracic Assessment: Exceptions to [facility] (increased kyphosis, R trunk shortening, L trunk lengthening) Lumbar Assessment Lumbar Assessment: Exceptions to [facility] (Posterior pelvic tilt, reversal of lumbar lordosis) Postural Control Postural Control: Deficits on evaluation (impaired righting reactions in sitting/standing)  Balance Balance Balance Assessed: Yes Static Sitting Balance Static Sitting - Balance Support: No upper extremity supported;Feet supported Static Sitting - Level of Assistance: 6: Modified independent (Device/Increase time) Dynamic Sitting Balance Dynamic Sitting - Balance Support: Feet supported;Right upper extremity supported;During functional activity Dynamic Sitting - Level of Assistance: 6: Modified independent (Device/Increase time) Dynamic Sitting - Balance Activities: Forward lean/weight shifting;Reaching for objects;Reaching across midline Sitting balance - Comments: posterior LOB with LE activities such as putting shoes on Static  Standing Balance Static Standing - Balance Support: Right upper extremity supported Static Standing - Level of Assistance: 4: Min assist Dynamic Standing Balance Dynamic Standing - Balance Support: Right upper extremity supported;During functional activity Dynamic Standing - Level of Assistance: 4: Min assist Dynamic Standing - Balance Activities: Forward lean/weight shifting;Reaching for objects Extremity/Trunk Assessment RUE Assessment RUE Assessment: Within Functional Limits LUE Assessment LUE Assessment: Exceptions to [facility] LUE AROM (degrees) Overall AROM Left  Upper Extremity: Deficits;Other (comment) (no volitional movement in LUE)   See Function Navigator for Current Functional Status.  Dierdre Searles 10/20/2015, 4:05 PM

## 2015-10-20 NOTE — Progress Notes (Signed)
Subjective/Complaints: Daughter and grand daughter getting family teaching this week ROS- denies CP, SOB, N/V/D Objective: Vital Signs: Blood pressure (!) 162/54, pulse (!) 51, temperature 98.1 F (36.7 C), temperature source Oral, resp. rate 18, height 5' 7" (1.702 m), weight 63.4 kg (139 lb 12.4 oz), SpO2 96 %. No results found. Results for orders placed or performed during the hospital encounter of 10/05/15 (from the past 72 hour(s))  Creatinine, serum     Status: None   Collection Time: 10/19/15  5:21 AM  Result Value Ref Range   Creatinine, Ser 0.55 0.44 - 1.00 mg/dL   GFR calc non Af Amer >60 >60 mL/min   GFR calc Af Amer >60 >60 mL/min    Comment: (NOTE) The eGFR has been calculated using the CKD EPI equation. This calculation has not been validated in all clinical situations. eGFR's persistently <60 mL/min signify possible Chronic Kidney Disease.      HEENT: Left eye deviated laterally and upward, no vision, neg JVD Cardio: RRR and no murmur Resp: CTA B/L and unlabored GI: BS positive and NT, ND Extremity:  Pulses positive and No Edema Skin:   Other erythema around IV site Neuro: Alert/Oriented, Normal Sensory, Abnormal Motor 0 in left delt, bi, tri, 0/5 grip and 3- Left hip/knee ext synergy trace L ankle DF,  Other left neglect vs visual loss L eye Musc/Skel mild left leg swelling, non tender Gen NAD   Assessment/Plan: 1. Functional deficits secondary to  Left-sided weakness with dysarthria/dysphagia secondary to right basal ganglia corona radiata infarct which require 3+ hours per day of interdisciplinary therapy in a comprehensive inpatient rehab setting. Physiatrist is providing close team supervision and 24 hour management of active medical problems listed below. Physiatrist and rehab team continue to assess barriers to discharge/monitor patient progress toward functional and medical goals. FIM: Function - Bathing Position: Shower Body parts bathed by  patient: Left arm, Chest, Abdomen, Front perineal area, Right upper leg, Buttocks, Left upper leg Body parts bathed by helper: Right arm, Right lower leg, Left lower leg, Back Bathing not applicable: Front perineal area, Buttocks, Right upper leg, Left upper leg, Left lower leg, Right lower leg Assist Level:  (mod A)  Function- Upper Body Dressing/Undressing What is the patient wearing?: Pull over shirt/dress Bra - Perfomed by patient: Thread/unthread right bra strap Bra - Perfomed by helper: Thread/unthread left bra strap, Hook/unhook bra (pull down sports bra) Pull over shirt/dress - Perfomed by patient: Thread/unthread right sleeve, Put head through opening Pull over shirt/dress - Perfomed by helper: Thread/unthread left sleeve, Pull shirt over trunk, Thread/unthread right sleeve, Put head through opening Assist Level:  (Max A due to pt fatigue) Set up : To obtain clothing/put away Function - Lower Body Dressing/Undressing What is the patient wearing?: Pants, Shoes Position: Sitting EOB Pants- Performed by patient: Thread/unthread right pants leg Pants- Performed by helper: Thread/unthread right pants leg, Thread/unthread left pants leg, Pull pants up/down Non-skid slipper socks- Performed by helper: Don/doff right sock, Don/doff left sock Socks - Performed by helper: Don/doff right sock, Don/doff left sock Shoes - Performed by patient: Don/doff left shoe Shoes - Performed by helper: Don/doff right shoe, Don/doff left shoe Assist for footwear: Maximal assist (Due to pt fatigue) Assist for lower body dressing:  (Max A due to pt fatigue)  Function - Toileting Toileting steps completed by patient: Performs perineal hygiene Toileting steps completed by helper: Adjust clothing prior to toileting, Adjust clothing after toileting Toileting Assistive Devices: Grab bar or rail  Assist level: Touching or steadying assistance (Pt.75%)  Function - Toilet Transfers Toilet transfer assistive  device: Elevated toilet seat/BSC over toilet, Grab bar Assist level to toilet: Moderate assist (Pt 50 - 74%/lift or lower) Assist level from toilet: Moderate assist (Pt 50 - 74%/lift or lower)  Function - Chair/bed transfer Chair/bed transfer method: Squat pivot Chair/bed transfer assist level: Moderate assist (Pt 50 - 74%/lift or lower) Chair/bed transfer assistive device: Armrests Mechanical lift: Stedy Chair/bed transfer details: Verbal cues for sequencing, Verbal cues for technique, Manual facilitation for weight bearing, Manual facilitation for placement, Manual facilitation for weight shifting, Verbal cues for precautions/safety, Verbal cues for safe use of DME/AE, Tactile cues for initiation  Function - Locomotion: Wheelchair Will patient use wheelchair at discharge?: Yes Type: Manual Max wheelchair distance: 175 Assist Level: Supervision or verbal cues Assist Level: Supervision or verbal cues Assist Level: Supervision or verbal cues Turns around,maneuvers to table,bed, and toilet,negotiates 3% grade,maneuvers on rugs and over doorsills: No Function - Locomotion: Ambulation Assistive device: Rail in hallway Max distance: 25 Assist level: Moderate assist (Pt 50 - 74%) Assist level: Moderate assist (Pt 50 - 74%) Walk 50 feet with 2 turns activity did not occur: Safety/medical concerns Assist level: Moderate assist (Pt 50 - 74%) Walk 150 feet activity did not occur: Safety/medical concerns Walk 10 feet on uneven surfaces activity did not occur: Safety/medical concerns  Function - Comprehension Comprehension: Auditory Comprehension assistive device: Hearing aids Comprehension assist level: Follows basic conversation/direction with extra time/assistive device  Function - Expression Expression: Verbal Expression assist level: Expresses basic needs/ideas: With extra time/assistive device  Function - Social Interaction Social Interaction assist level: Interacts appropriately 90%  of the time - Needs monitoring or encouragement for participation or interaction.  Function - Problem Solving Problem solving assist level: Solves basic 75 - 89% of the time/requires cueing 10 - 24% of the time  Function - Memory Memory assist level: Recognizes or recalls 75 - 89% of the time/requires cueing 10 - 24% of the time Patient normally able to recall (first 3 days only): Current season, Location of own room, That he or she is in a hospital  Medical Problem List and Plan: 1.  Left-sided weakness with dysarthria/dysphagia secondary to right basal ganglia corona radiata infarct- some increased weakness last week but now neuro deficits LE improving, Team conference today please see physician documentation under team conference tab, met with team face-to-face to discuss problems,progress, and goals. Formulized individual treatment plan based on medical history, underlying problem and comorbidities.2.  DVT Prophylaxis/Anticoagulation: Subcutaneous Lovenox. no signs of bleeding, plt 220K 3. Pain Management: Tylenol as needed 4. Dysphagia. Dysphagia #2 thin liquid diet. Needs supervision,intake 95% meals, adequate fluid 5. Neuropsych: This patient is capable of making decisions on her own behalf. 6. Skin/Wound Care: Routine skin checks 7. Fluids/Electrolytes/Nutrition: Routine I&O's BMET normal 7/12,  8. Hypertension.will start controlling gradually Monitor with increased mobility. Patient on Tenormin 50 mg daily prior to admission., will decrease to20m d/t brady ,will increase  dose norvasc, 155mqam    Vitals:   10/19/15 1416 10/20/15 0544  BP: (!) 147/52 (!) 162/54  Pulse: (!) 58 (!) 51  Resp: 18 18  Temp: 98 F (36.7 C) 98.1 F (36.7 C)   9. Mood. Resume Lexapro 10 mg daily 10. Hard of hearing. 11. Decreased vision right eye. 12. Hyperlipidemia. Pravachol 13. History of colorectal cancer diagnosed 2010. Status post resection. Presently in remission. Followed by Dr. ShAlen Blew4.   Poor sleep, multifactorial will  trial oxybutnin at noc for freq, overall improved nocturia, side effect of dry mouth  LOS (Days) 15 A FACE TO FACE EVALUATION WAS PERFORMED  KIRSTEINS,ANDREW E 10/20/2015, 7:58 AM

## 2015-10-20 NOTE — Progress Notes (Signed)
Occupational Therapy Session Note  Patient Details  Name: Karen [name] MRN: [id number] Date of Birth: 14-Jul-1922  Today's Date: 10/20/2015 OT Individual Time: [id number] and [id number] OT Individual Time Calculation (min): 57 min and 32 min     Short Term Goals: Week 2:  OT Short Term Goal 1 (Week 2): Pt will tolerate unsupported seated posture for 4 minutes for increased performance in self care tasks.  OT Short Term Goal 2 (Week 2): Pt will self correct unsupported seated posture with minimal verbal cues during 15 minute activity.  OT Short Term Goal 3 (Week 2): Pt will complete toilet transfers at Mod A for reduced caregiver burden.  OT Short Term Goal 4 (Week 2): Pt will complete UB dressing at Mod A for reduced caregiver burden.   Skilled Therapeutic Interventions/Progress Updates:    1 of 2) Treatment session with focus on self care skills, functional transfers, and energy conservation strategies. Pt received in bed at start of session and expressed need to use toilet. Pt transferred from bed to [place] and WC <> elevated toilet seat at [place] assisted pt cleaning buttocks due to large, loose incontinent bowel movement with pt performing perineal care with supervision.   Pt completed bathing at sink with set up and Min A to clean LUE. UB dressing completed with pt seated in WC at S with max verbal cues provided by clinician for recall of hemi dressing techniques. LB dressing also completed with pt seated in WC and Mod A to assist with sit to stand and to pull up trousers. Sit to stand completed x2 due to pt initiation of back extension during stand initially, verbal cues for pt to return to seated, verbal cues for correction, then completing sit to stand again.   Discussed energy conservation with pt by means of assistance levels for completion of ADL tasks such as dressing for valued occupations later in the day. Also provided education regarding importance pt being able to provide  verbal cues for assists needed such as with UB hemi dressing for safe handling, proper sequence, and caregiver form, if applicable. Pt verbalized agreement.   Pt transferred back to bed at Mod A and left with bed alarm set and all needs in reach.   2 of 2) Family caregiver (dtr) not present during scheduled family education session today.   Treatment session with focus on HEP. Clinician reviewed AAROM / self-ROM to pt satisfaction with pt demonstrating understanding. Provided pt education regarding supportive positioning of LUE during exercises, energy conservation, and verbally asserting needs during care. Clinician wrote notes for additional explanation of HEP, hemi dressing techniques, and energy conservation ideas to be used by care partner post d/c.   Let pt in bed with alarm set and all needs in reach.   Therapy Documentation Precautions:  Precautions Precautions: Fall Restrictions Weight Bearing Restrictions: No General:   Vital Signs:  Pain: Pain Assessment Pain Assessment: No/denies pain  See Function Navigator for Current Functional Status.   Therapy/Group: Individual Therapy  Dierdre Searles 10/20/2015, 12:20 PM

## 2015-10-20 NOTE — Patient Care Conference (Signed)
Inpatient RehabilitationTeam Conference and Plan of Care Update Date: 10/20/2015   Time: 11:20 AM    Patient Name: Karen [name]      Medical Record Number: [id number]  Date of Birth: 1922/12/18 Sex: Female         Room/Bed: 4M04C/4M04C-01 Payor Info: Payor: MEDICARE / Plan: MEDICARE PART A AND B / Product Type: *No Product type* /    Admitting Diagnosis: Rt CVA  Admit Date/Time:  10/05/2015  4:28 PM Admission Comments: No comment available   Primary Diagnosis:  <principal problem not specified> Principal Problem: <principal problem not specified>  Patient Active Problem List   Diagnosis Date Noted  . HOH (hard of hearing)   . Acute ischemic stroke ([place]) 10/04/2015  . Hyperlipidemia 10/04/2015  . Cerebrovascular accident (CVA) due to occlusion of cerebral artery ([place])   . Tachycardia   . Dysphagia, post-stroke   . Stroke ([place]) 10/03/2015  . CVA (cerebral infarction) 10/03/2015  . Hypotension due to drugs   . HLD (hyperlipidemia)   . Legally blind   . Notalgia   . Fall 04/02/2015  . Depression 01/26/2015  . Essential hypertension   . Aortic valve disorder 11/26/2009  . NEOPLASM, MALIGNANT, COLON, CECUM 10/30/2008  . ANEMIA 10/21/2008  . PERSONAL HX COLONIC POLYPS 10/21/2008  . GALLSTONES 10/09/2008  . Backache 06/27/2007  . OCCLUSION, CENTRAL RETINAL ARTERY 08/14/2006  . Glaucoma 08/14/2006  . Diverticulosis of colon (without mention of hemorrhage) 08/14/2006  . SYNCOPE, HX OF 08/14/2006    Expected Discharge Date: Expected Discharge Date: 10/21/15  Team Members Present: Physician leading conference: Dr. Alysia Penna Social Worker Present: Ovidio Kin, LCSW Nurse Present: Heather Roberts, RN PT Present: Kem Parkinson, PT OT Present: Simonne Come, OT;Other (comment) Wynelle Link) SLP Present: Weldon Inches, SLP PPS Coordinator present : Daiva Nakayama, RN, CRRN     Current Status/Progress Goal Weekly Team Focus  Medical   Still has severe left upper  extremity weakness, needs set up for meals  Maintain medical stability for home discharge  Adjust medications, patient is bradycardic but has elevated blood pressures.   Bowel/Bladder   continent with B/B with urgency. LBM 10/19/15 urine foul odor.  remain continent of B/B  continue to monitor q shift   Swallow/Nutrition/ Hydration             ADL's   Mod to Max A overall   Mod A overall   Seated postural stability, LUE NMR, transfer training, family education, and ADL retraining    Mobility   minA bed mobility, modA transfers, modA gait  minA bed mobility, modA transfers (car, w/c <>bed), 25' modA gait controlled   family education and hands-on training   Communication             Safety/Cognition/ Behavioral Observations  no unsafe behavior  no unsafe behavior  continue to monitor q shift   Pain   Occaisional pain treated with prn Tylenol  pain less than or equal to 2  monitor and treat pain q shift and prn   Skin   scattered bruises; bottom pink, blanchable  Remain free from breakdown  monitor q shift      *See Care Plan and progress notes for long and short-term goals.  Barriers to Discharge: Blood pressure and heart rate issues    Possible Resolutions to Barriers:  Medication adjustments made    Discharge Planning/Teaching Needs:  Family has been in for family education this week in preparation for discharge tomorrow.  Team Discussion:  Granddaughter and daughter have been in for family training and both feel comfortable with pt's care. Dys 3 thin liquid diet. LE improved but not UE. Swallowing better. Team had downgraded goals to min/mod assist level. Family committed to providing care to pt at home  Revisions to Treatment Plan:  DC tomorrow-downgraded goals to min/mod assist level   Continued Need for Acute Rehabilitation Level of Care: The patient requires daily medical management by a physician with specialized training in physical medicine and rehabilitation  for the following conditions: Daily direction of a multidisciplinary physical rehabilitation program to ensure safe treatment while eliciting the highest outcome that is of practical value to the patient.: Yes Daily medical management of patient stability for increased activity during participation in an intensive rehabilitation regime.: Yes Daily analysis of laboratory values and/or radiology reports with any subsequent need for medication adjustment of medical intervention for : Cardiac problems;Neurological problems  Elease Hashimoto 10/20/2015, 1:46 PM

## 2015-10-21 MED ORDER — OXYBUTYNIN CHLORIDE 5 MG PO TABS: 2.5000 mg | ORAL_TABLET | Freq: Every day | ORAL | 0 refills | Status: DC

## 2015-10-21 MED ORDER — ATENOLOL 50 MG PO TABS: 25.0000 mg | ORAL_TABLET | Freq: Every day | ORAL | 0 refills | Status: DC

## 2015-10-21 MED ORDER — AMLODIPINE BESYLATE 10 MG PO TABS: 10.0000 mg | ORAL_TABLET | Freq: Every day | ORAL | 0 refills | Status: DC

## 2015-10-21 MED ORDER — MUSCLE RUB 10-15 % EX CREA: 1.0000 "application " | TOPICAL_CREAM | CUTANEOUS | 0 refills | Status: AC | PRN

## 2015-10-21 NOTE — Progress Notes (Signed)
Social Work  Discharge Note  The overall goal for the admission was met for:   Discharge location: [place] [facility]  Length of Stay: Yes-16 DAYS  Discharge activity level: Yes-MIN/MOD ASSIST  Home/community participation: Yes  Services provided included: MD, RD, PT, OT, SLP, RN, CM, TR, Pharmacy and SW  Financial Services: Medicare  Follow-up services arranged: Home Health: [facility]-PT,OT,RN,SP, DME: [facility] and Patient/Family request agency HH: USED BEFORE, DME: NO PREF  Comments (or additional information):DAUGHTER AND GRANDDAUGHTER WERE HERE FOR MULTIPLE DAYS AND FEEL COMFORTABLE WITH PT'S CARE AT HOME.  Patient/Family verbalized understanding of follow-up arrangements: Yes  Individual responsible for coordination of the follow-up plan: BRITTANY-GRANDDAUGHTER & KATHY-DAUGHTER  Confirmed correct DME delivered: Elease Hashimoto 10/21/2015    Elease Hashimoto

## 2015-10-21 NOTE — Progress Notes (Signed)
Social Work Patient ID: Karen [name], female   DOB: [birth year], 82 y.o.   MRN: [id number]   Since wheelchair not here plan to deliver to daughter's home due to has three children here and do not want to wait. Daughter spoke with Oswald Hillock for [facility] and this worker and all in agreement with plan. Bedside commode delivered to room. Daughter to call this worker if doesn't get delivered today.

## 2015-10-21 NOTE — Progress Notes (Signed)
Subjective/Complaints: Pt asking whether her meds have changed ROS- denies CP, SOB, N/V/D Objective: Vital Signs: Blood pressure (!) 185/64, pulse 72, temperature 97.6 F (36.4 C), temperature source Oral, resp. rate 18, height 5' 7" (1.702 m), weight 63.4 kg (139 lb 12.4 oz), SpO2 97 %. No results found. Results for orders placed or performed during the hospital encounter of 10/05/15 (from the past 72 hour(s))  Creatinine, serum     Status: None   Collection Time: 10/19/15  5:21 AM  Result Value Ref Range   Creatinine, Ser 0.55 0.44 - 1.00 mg/dL   GFR calc non Af Amer >60 >60 mL/min   GFR calc Af Amer >60 >60 mL/min    Comment: (NOTE) The eGFR has been calculated using the CKD EPI equation. This calculation has not been validated in all clinical situations. eGFR's persistently <60 mL/min signify possible Chronic Kidney Disease.      HEENT: Left eye deviated laterally and upward, no vision, neg JVD Cardio: RRR and no murmur Resp: CTA B/L and unlabored GI: BS positive and NT, ND Extremity:  Pulses positive and No Edema  Neuro: Alert/Oriented, Normal Sensory, Abnormal Motor 0 in left delt, bi, tri, 0/5 grip and 3- Left hip/knee ext synergy trace L ankle DF,  Other left neglect vs visual loss L eye Musc/Skel mild left leg swelling, non tender Gen NAD   Assessment/Plan: 1. Functional deficits secondary to  Left-sided weakness with dysarthria/dysphagia secondary to right basal ganglia corona radiata infarct  Stable for D/C today F/u PCP in 3-4 weeks F/u PM&R 2 weeks See D/C summary See D/C instructions FIM: Function - Bathing Position: Wheelchair/chair at sink Body parts bathed by patient: Left arm, Chest, Abdomen, Front perineal area, Right upper leg, Left upper leg, Right lower leg, Left lower leg Body parts bathed by helper: Left arm, Buttocks, Back Bathing not applicable: Front perineal area, Buttocks, Right upper leg, Left upper leg, Left lower leg, Right lower  leg Assist Level: Touching or steadying assistance(Pt > 75%)  Function- Upper Body Dressing/Undressing What is the patient wearing?: Pull over shirt/dress Bra - Perfomed by patient: Thread/unthread right bra strap Bra - Perfomed by helper: Thread/unthread left bra strap, Hook/unhook bra (pull down sports bra) Pull over shirt/dress - Perfomed by patient: Thread/unthread right sleeve, Thread/unthread left sleeve, Put head through opening, Pull shirt over trunk Pull over shirt/dress - Perfomed by helper: Thread/unthread left sleeve, Pull shirt over trunk, Thread/unthread right sleeve, Put head through opening Assist Level: Supervision or verbal cues Set up : To obtain clothing/put away Function - Lower Body Dressing/Undressing What is the patient wearing?: Pants, Shoes Position: Wheelchair/chair at sink Pants- Performed by patient: Thread/unthread right pants leg, Thread/unthread left pants leg Pants- Performed by helper: Pull pants up/down Non-skid slipper socks- Performed by helper: Don/doff right sock, Don/doff left sock Socks - Performed by helper: Don/doff right sock, Don/doff left sock Shoes - Performed by patient: Don/doff right shoe Shoes - Performed by helper: Don/doff left shoe Assist for footwear: Partial/moderate assist Assist for lower body dressing:  (Mod A)  Function - Toileting Toileting steps completed by patient: Performs perineal hygiene Toileting steps completed by helper: Adjust clothing prior to toileting, Adjust clothing after toileting Toileting Assistive Devices: Grab bar or rail Assist level: Touching or steadying assistance (Pt.75%)  Function - Air cabin crew transfer assistive device: Elevated toilet seat/BSC over toilet Assist level to toilet: Moderate assist (Pt 50 - 74%/lift or lower) Assist level from toilet: Moderate assist (Pt 50 - 74%/lift  or lower)  Function - Chair/bed transfer Chair/bed transfer method: Squat pivot Chair/bed transfer  assist level: Moderate assist (Pt 50 - 74%/lift or lower) Chair/bed transfer assistive device: Armrests Mechanical lift: Stedy Chair/bed transfer details: Verbal cues for sequencing, Verbal cues for technique, Manual facilitation for weight bearing, Manual facilitation for placement, Manual facilitation for weight shifting, Verbal cues for precautions/safety, Verbal cues for safe use of DME/AE, Tactile cues for initiation  Function - Locomotion: Wheelchair Will patient use wheelchair at discharge?: Yes Type: Motorized Max wheelchair distance: 150 Assist Level: Supervision or verbal cues Assist Level: Supervision or verbal cues Assist Level: Supervision or verbal cues Turns around,maneuvers to table,bed, and toilet,negotiates 3% grade,maneuvers on rugs and over doorsills: No Function - Locomotion: Ambulation Assistive device: Rail in hallway Max distance: 25 Assist level: Moderate assist (Pt 50 - 74%) Assist level: Moderate assist (Pt 50 - 74%) Walk 50 feet with 2 turns activity did not occur: Safety/medical concerns Assist level: Moderate assist (Pt 50 - 74%) Walk 150 feet activity did not occur: Safety/medical concerns Walk 10 feet on uneven surfaces activity did not occur: Safety/medical concerns Assist level: Moderate assist (Pt 50 - 74%)  Function - Comprehension Comprehension: Auditory Comprehension assistive device: Hearing aids Comprehension assist level: Understands complex 90% of the time/cues 10% of the time  Function - Expression Expression: Verbal Expression assist level: Expresses complex 90% of the time/cues < 10% of the time  Function - Social Interaction Social Interaction assist level: Interacts appropriately 90% of the time - Needs monitoring or encouragement for participation or interaction.  Function - Problem Solving Problem solving assist level: Solves basic 75 - 89% of the time/requires cueing 10 - 24% of the time  Function - Memory Memory assist level:  Recognizes or recalls 75 - 89% of the time/requires cueing 10 - 24% of the time Patient normally able to recall (first 3 days only): Current season, Location of own room, That he or she is in a hospital  Medical Problem List and Plan: 1.  Left-sided weakness with dysarthria/dysphagia secondary to right basal ganglia corona radiata infarct- D/C home today 3. Pain Management: Tylenol as needed 4. Dysphagia. Dysphagia #2 thin liquid diet. Needs supervision,intake 95% meals, adequate fluid 5. Neuropsych: This patient is capable of making decisions on her own behalf. 6. Skin/Wound Care: Routine skin checks 7. Fluids/Electrolytes/Nutrition: Routine I&O's BMET normal 7/12,  8. Hypertension.will start controlling gradually Monitor with increased mobility. Patient on Tenormin 50 mg daily prior to admission., will decrease to67m d/t brady ,will increase  dose norvasc, 171mqam    Vitals:   10/20/15 1422 10/21/15 0452  BP: (!) 154/50 (!) 185/64  Pulse: 69 72  Resp: 18 18  Temp: 97.6 F (36.4 C) 97.6 F (36.4 C)   9. Mood. Resume Lexapro 10 mg daily 10. Hard of hearing. 11. Decreased vision right eye. 12. Hyperlipidemia. Pravachol 13. History of colorectal cancer diagnosed 2010. Status post resection. Presently in remission. Followed by Dr. ShAlen Blew4.  Poor sleep, multifactorial will trial oxybutnin at noc for freq, overall improved nocturia, side effect of dry mouth  LOS (Days) 16 A FACE TO FACE EVALUATION WAS PERFORMED  Tasfia Vasseur E 10/21/2015, 7:34 AM

## 2015-10-21 NOTE — Progress Notes (Signed)
Pt. Got d/c instructions and follow up appointments.Pt. Ready to go home with her daughter.

## 2015-10-21 NOTE — Discharge Instructions (Signed)
Inpatient Rehab Discharge Instructions  VENEZIA SAKUMA Discharge date and time: 10/21/15   Activities/Precautions/ Functional Status: Activity: activity as tolerated Diet: Soft Wound Care: none needed   Functional status:  ___ No restrictions     ___ Walk up steps independently _X__ 24/7 supervision/assistance   ___ Walk up steps with assistance ___ Intermittent supervision/assistance  ___ Bathe/dress independently ___ Walk with walker     __X__Bathe/dress with assistance ___ Walk Independently    ___ Shower independently ___ Walk with assistance    ___ Shower with assistance ___ No alcohol     ___ Return to work/school ________        STROKE/TIA DISCHARGE INSTRUCTIONS SMOKING Cigarette smoking nearly doubles your risk of having a stroke & is the single most alterable risk factor  If you smoke or have smoked in the last 12 months, you are advised to quit smoking for your health.  Most of the excess cardiovascular risk related to smoking disappears within a year of stopping.  Ask you doctor about anti-smoking medications  [place] Quit Line: 1-800-QUIT NOW  Free Smoking Cessation Classes [phone]  CHOLESTEROL Know your levels; limit fat & cholesterol in your diet  Lipid Panel     Component Value Date/Time   CHOL 120 10/04/2015 0801   TRIG 79 10/04/2015 0801   HDL 33 (L) 10/04/2015 0801   CHOLHDL 3.6 10/04/2015 0801   VLDL 16 10/04/2015 0801   LDLCALC 71 10/04/2015 0801      Many patients benefit from treatment even if their cholesterol is at goal.  Goal: Total Cholesterol (CHOL) less than 160  Goal:  Triglycerides (TRIG) less than 150  Goal:  HDL greater than 40  Goal:  LDL (LDLCALC) less than 100   BLOOD PRESSURE American Stroke Association blood pressure target is less that 120/80 mm/Hg  Your discharge blood pressure is:  BP: (!) 149/51  Monitor your blood pressure  Limit your salt and alcohol intake  Many individuals will require more than one  medication for high blood pressure  DIABETES (A1c is a blood sugar average for last 3 months) Goal HGBA1c is under 7% (HBGA1c is blood sugar average for last 3 months)  Diabetes: No known diagnosis of diabetes    Lab Results  Component Value Date   HGBA1C 5.4 10/03/2015     Your HGBA1c can be lowered with medications, healthy diet, and exercise.  Check your blood sugar as directed by your physician  Call your physician if you experience unexplained or low blood sugars.  PHYSICAL ACTIVITY/REHABILITATION Goal is 30 minutes at least 4 days per week  Activity: Increase activity slowly, Therapies: Physical Therapy: Home Health Return to work: N/A  Activity decreases your risk of heart attack and stroke and makes your heart stronger.  It helps control your weight and blood pressure; helps you relax and can improve your mood.  Participate in a regular exercise program.  Talk with your doctor about the best form of exercise for you (dancing, walking, swimming, cycling).  DIET/WEIGHT Goal is to maintain a healthy weight  Your discharge diet is: DIET DYS 3 Room service appropriate?: Yes; Fluid consistency:: Thin  liquids Your height is:  Height: 5\' 7"  (170.2 cm) Your current weight is: Weight: 63.4 kg (139 lb 12.4 oz) Your Body Mass Index (BMI) is:  BMI (Calculated): 23.6  Following the type of diet specifically designed for you will help prevent another stroke.  You are at  goal weight    Your goal  Body Mass Index (BMI) is 19-24.  Healthy food habits can help reduce 3 risk factors for stroke:  High cholesterol, hypertension, and excess weight.  RESOURCES Stroke/Support Group:  Call [phone]   STROKE EDUCATION PROVIDED/REVIEWED AND GIVEN TO PATIENT Stroke warning signs and symptoms How to activate emergency medical system (call 911). Medications prescribed at discharge. Need for follow-up after discharge. Personal risk factors for stroke. Pneumonia vaccine given:  Flu vaccine  given:  My questions have been answered, the writing is legible, and I understand these instructions.  I will adhere to these goals & educational materials that have been provided to me after my discharge from the hospital.    Special Instructions:    COMMUNITY REFERRALS UPON DISCHARGE:    Home Health:   PT, OT, SP, RN  [place] Phone:[phone]   Date of last service:10/21/2015  Medical Equipment/Items [place]   [phone]   GENERAL COMMUNITY RESOURCES FOR PATIENT/FAMILY: Support Groups:[facility] SUPPORT GROUP EVERY SECOND Thursday @ 3:00-4:00 PM ON THE REHAB UNIT QUESTIONS CONTACT KATIE [phone]   My questions have been answered and I understand these instructions. I will adhere to these goals and the provided educational materials after my discharge from the hospital.  Patient/Caregiver Signature _______________________________ Date __________  Clinician Signature _______________________________________ Date __________  Please bring this form and your medication list with you to all your follow-up doctor's appointments.

## 2015-10-22 DIAGNOSIS — R531 Weakness: Secondary | ICD-10-CM | POA: Diagnosis not present

## 2015-10-22 DIAGNOSIS — R112 Nausea with vomiting, unspecified: Secondary | ICD-10-CM | POA: Diagnosis not present

## 2015-10-22 DIAGNOSIS — R404 Transient alteration of awareness: Secondary | ICD-10-CM | POA: Diagnosis not present

## 2015-10-23 DIAGNOSIS — R1310 Dysphagia, unspecified: Secondary | ICD-10-CM | POA: Diagnosis not present

## 2015-10-23 DIAGNOSIS — Z993 Dependence on wheelchair: Secondary | ICD-10-CM | POA: Diagnosis not present

## 2015-10-23 DIAGNOSIS — F329 Major depressive disorder, single episode, unspecified: Secondary | ICD-10-CM | POA: Diagnosis not present

## 2015-10-23 DIAGNOSIS — I69322 Dysarthria following cerebral infarction: Secondary | ICD-10-CM | POA: Diagnosis not present

## 2015-10-23 DIAGNOSIS — H409 Unspecified glaucoma: Secondary | ICD-10-CM | POA: Diagnosis not present

## 2015-10-23 DIAGNOSIS — I10 Essential (primary) hypertension: Secondary | ICD-10-CM | POA: Diagnosis not present

## 2015-10-23 DIAGNOSIS — E7800 Pure hypercholesterolemia, unspecified: Secondary | ICD-10-CM | POA: Diagnosis not present

## 2015-10-23 DIAGNOSIS — Z9181 History of falling: Secondary | ICD-10-CM | POA: Diagnosis not present

## 2015-10-23 DIAGNOSIS — K219 Gastro-esophageal reflux disease without esophagitis: Secondary | ICD-10-CM | POA: Diagnosis not present

## 2015-10-23 DIAGNOSIS — K5790 Diverticulosis of intestine, part unspecified, without perforation or abscess without bleeding: Secondary | ICD-10-CM | POA: Diagnosis not present

## 2015-10-23 DIAGNOSIS — H5412 Blindness, left eye, low vision right eye: Secondary | ICD-10-CM | POA: Diagnosis not present

## 2015-10-23 DIAGNOSIS — Z7902 Long term (current) use of antithrombotics/antiplatelets: Secondary | ICD-10-CM | POA: Diagnosis not present

## 2015-10-23 DIAGNOSIS — I69352 Hemiplegia and hemiparesis following cerebral infarction affecting left dominant side: Secondary | ICD-10-CM | POA: Diagnosis not present

## 2015-10-23 DIAGNOSIS — D649 Anemia, unspecified: Secondary | ICD-10-CM | POA: Diagnosis not present

## 2015-10-23 DIAGNOSIS — I69391 Dysphagia following cerebral infarction: Secondary | ICD-10-CM | POA: Diagnosis not present

## 2015-10-24 NOTE — Discharge Summary (Signed)
Discharge summary job # [id number]

## 2015-10-24 NOTE — Discharge Summary (Signed)
Karen [name], LAWS                ACCOUNT NO.:  [id number]  MEDICAL RECORD NO.:  [id number]  LOCATION:  4M04C                        FACILITY:  [facility]  PHYSICIAN:  Pramod P. Leonie Man, MD    DATE OF BIRTH:  12-08-1922  DATE OF ADMISSION:  10/04/2015 DATE OF DISCHARGE:  10/21/2015                              DISCHARGE SUMMARY   DISCHARGE DIAGNOSES: 1. Right basal ganglia, corona radiata infarct. 2. Dysphagia. 3. Hypertension. 4. Hard of hearing. 5. Hyperlipidemia.  HISTORY OF PRESENT ILLNESS:  This is an 82 year old right-handed female, with history of colorectal cancer, diagnosed 2010, status post resection followed by Dr. Alen Blew as well as hypertension, hard of hearing.  She lives with her granddaughter and 63-month-old twins.  Used an occasional cane prior to admission.  Presented October 03, 2015, with left-sided weakness and slurred speech.  Cranial CT scan negative.  MRI of the brain showed acute lacunar type infarct tracking from the posterior right corona radiata to the posterior lentiform nuclei.  CT angiogram of head and neck.  No acute findings.  Echocardiogram with ejection fraction of [id number] grade 1 diastolic dysfunction.  The patient did not receive tPA.  Maintained on Plavix for CVA prophylaxis.  Subcutaneous Lovenox for DVT prophylaxis.  Dysphagia #2 thin liquid diet.  The patient was admitted for comprehensive rehab program.  PAST MEDICAL HISTORY:  See discharge diagnoses.  SOCIAL HISTORY:  Lives with granddaughter and 80-month-old twins, use an occasional cane prior to admission.  Functional status upon admission to rehab services was moderate assist, stand pivot transfers, minimal assist supine to sit.  Mod to max assist activities of daily living.  PHYSICAL EXAMINATION:  VITAL SIGNS:  Blood pressure 173/63, pulse 105, temperature 97, respirations 18. GENERAL:  This was an alert female, oriented to person, place, and time. LUNGS:  Clear to auscultation.  No wheeze  cardiac regular rate and rhythm.  No murmur. ABDOMEN:  Soft, nontender.  Good bowel sounds. NEUROLOGIC:  Decreased vision of right eye.  The patient was hard of hearing.  Followed simple commands.  REHABILITATION HOSPITAL COURSE:  Patient was admitted to inpatient rehab services with therapies initiated on a 3-hour daily basis, consisting of physical therapy, occupational therapy, speech therapy, and rehabilitation nursing.  The following issues were addressed during the patient's rehabilitation stay.  Pertaining to Mrs. Karen [name], right basal ganglia, corona radiata infarct remained stable.  She would follow up with Neurology Services.  During a rehab course, noted increased left- sided weakness and cranial CT scan completed showed evolution of stated stroke, remained on anticoagulation therapy.  Blood pressure is well controlled and monitored.  She continued on dysphagia #2 thin liquid diet.  She was hard of hearing, but was able to communicate her needs. Dysarthric speech, but again able to communicate her needs.  The patient received weekly collaborative interdisciplinary team conferences to discuss estimated length of stay, family teaching, any barriers to her discharge.  She did show improved balance activity tolerance.  She was able to compensate for her deficits with left-sided weakness.  Improved attention and awareness improved coordination.  She was discharged to wheelchair level moderate assistance.  All issues in regard to teaching were completed  with family.  Activities of daily living and homemaking. She could transfer bed to wheelchair.  Wheelchair elevated.  Toilet seat, moderate assistance.  Upper body dressing, completed.  Wheelchair at supervision of max verbal cues.  Lower body dressing, completed.  The patient seated to wheelchair.  Full family teaching was completed with plan discharge to home.  DISCHARGE MEDICATIONS:  At time of dictation included Norvasc 10 mg  p.o. daily, Tenormin 25 mg daily, Lumigan ophthalmic drops 1 drop right eye at bedtime, Alphagan ophthalmic solution 1 drop both eyes every 8 hours, calcium 1 tablet daily, Plavix 75 mg p.o. daily, Lexapro 10 mg p.o. daily, Ditropan 5 mg at bedtime, Pravachol 10 mg daily, vitamin C 500 mg p.o. daily.  She would follow up Dr. Charlett Blake at the outpatient rehab center as directed; Dr. Kathie Rhodes. Sethi 1 month call for appointment; Dr. Marletta Lor, Medical Management.     Karen [name], P.A.   ______________________________ Kathie Rhodes. Leonie Man, MD    DA/MEDQ  D:  10/24/2015  T:  10/24/2015  Job:  [id number]  cc:   Charlett Blake, M.D.

## 2015-10-25 ENCOUNTER — Telehealth: Payer: Self-pay | Admitting: Internal Medicine

## 2015-10-25 ENCOUNTER — Telehealth: Payer: Self-pay

## 2015-10-25 ENCOUNTER — Telehealth: Payer: Self-pay | Admitting: Physical Medicine & Rehabilitation

## 2015-10-25 NOTE — Telephone Encounter (Signed)
[facility] OT is requesting OT for pt 2x a week for 6 week.  You my leave a detail msg on the vmsg.

## 2015-10-25 NOTE — Telephone Encounter (Signed)
Newell Coral PT with [facility] needs to get verbal orders for patient 1w1, then 2w8.  Please call her at [phone].

## 2015-10-25 NOTE — Telephone Encounter (Signed)
Okay for OT referral.

## 2015-10-25 NOTE — Telephone Encounter (Signed)
First attempt at [facility] call.

## 2015-10-25 NOTE — Telephone Encounter (Signed)
Left voicemail letting Estill Bamberg know.

## 2015-10-25 NOTE — Telephone Encounter (Signed)
Verbal orders approved per office protocol.

## 2015-10-26 NOTE — Telephone Encounter (Signed)
Second attempt at [facility] call. Unable to leave voicemail.

## 2015-10-27 NOTE — Telephone Encounter (Signed)
Third attempt at [facility] call. Unable to leave voicemail.

## 2015-10-29 ENCOUNTER — Ambulatory Visit (INDEPENDENT_AMBULATORY_CARE_PROVIDER_SITE_OTHER): Payer: Medicare Other | Admitting: Internal Medicine

## 2015-10-29 ENCOUNTER — Encounter: Payer: Self-pay | Admitting: Internal Medicine

## 2015-10-29 VITALS — BP 122/62 | HR 65 | Temp 97.5°F

## 2015-10-29 DIAGNOSIS — E784 Other hyperlipidemia: Secondary | ICD-10-CM

## 2015-10-29 DIAGNOSIS — D649 Anemia, unspecified: Secondary | ICD-10-CM

## 2015-10-29 DIAGNOSIS — I69354 Hemiplegia and hemiparesis following cerebral infarction affecting left non-dominant side: Secondary | ICD-10-CM

## 2015-10-29 DIAGNOSIS — I10 Essential (primary) hypertension: Secondary | ICD-10-CM | POA: Diagnosis not present

## 2015-10-29 DIAGNOSIS — H540 Blindness, both eyes: Secondary | ICD-10-CM | POA: Diagnosis not present

## 2015-10-29 DIAGNOSIS — I6350 Cerebral infarction due to unspecified occlusion or stenosis of unspecified cerebral artery: Secondary | ICD-10-CM

## 2015-10-29 NOTE — Patient Instructions (Signed)
Limit your sodium (Salt) intake  Please check your blood pressure on a regular basis.  If it is consistently greater than 150/90, please make an office appointment.  Follow-up neurology as scheduled  Continue active physical therapy and passive range of motion exercises

## 2015-10-29 NOTE — Progress Notes (Signed)
Subjective:    Patient ID: Karen [name], female    DOB: 1922-12-26, 82 y.o.   MRN: [id number]  HPI  DATE OF ADMISSION:  10/04/2015 DATE OF DISCHARGE:  10/21/2015                              DISCHARGE SUMMARY   DISCHARGE DIAGNOSES: 1. Right basal ganglia, corona radiata infarct. 2. Dysphagia. 3. Hypertension. 4. Hard of hearing. 5. Hyperlipidemia.  82 year old patient who is seen today following a recent hospital discharge.  She was admitted for evaluation following a right basal ganglion infarct.  She has received the inpatient rehabilitation and has been home for about 1 week. She lives with her granddaughter and continues to receive outpatient PT She has managed fairly well over the past week with some improvement of her left leg weakness.  She still requires a wheelchair.  Left arm remains flaccid.  Seems to be in good spirits. Plavix has been substituted for aspirin  Past Medical History:  Diagnosis Date  . ANEMIA 10/21/2008  . AORTIC INSUFFICIENCY 11/26/2009  . BACK PAIN 06/27/2007  . COLON CANCER, PERSONAL HX 02/25/2010  . Diverticulosis of colon (without mention of hemorrhage) 08/14/2006  . GALLSTONES 10/09/2008  . GLAUCOMA NOS 08/14/2006  . H/O: stroke    behind right eye  . HYPERTENSION 08/14/2006  . HYPOKALEMIA, HX OF 11/26/2009  . MELENA, HX OF 10/12/2008  . NEOPLASM, MALIGNANT, COLON, CECUM 10/30/2008  . OCCLUSION, CENTRAL RETINAL ARTERY 08/14/2006  . PERSONAL HX COLONIC POLYPS 10/21/2008  . SYNCOPE, HX OF 08/14/2006  . URI 09/07/2008     Social History   Social History  . Marital status: Married    Spouse name: N/A  . Number of children: 3  . Years of education: N/A   Occupational History  . retired Retired   Social History Main Topics  . Smoking status: Never Smoker  . Smokeless tobacco: Never Used  . Alcohol use No  . Drug use: No  . Sexual activity: Not on file   Other Topics Concern  . Not on file   Social History Narrative  . No narrative on  file    Past Surgical History:  Procedure Laterality Date  . ANKLE FRACTURE SURGERY  2011   right  . APPENDECTOMY    . CATARACT EXTRACTION     bilateral  . COLON SURGERY     11/20/2008,gallbladder removed  . DILATION AND CURETTAGE OF UTERUS    . TONSILLECTOMY      Family History  Problem Relation Age of Onset  . Stroke Neg Hx   . Heart disease Neg Hx   . Breast cancer Sister   . Heart attack Father   . Lung cancer Brother     Allergies  Allergen Reactions  . Morphine And Related Shortness Of Breath  . Sulfa Antibiotics Itching and Swelling  . Tape Other (See Comments)    PLEASE USE COBAN WRAP/PATIENT BRUISES EASILY AND HER SKIN IS THIN  . Amoxicillin Itching and Rash  . Ciprofloxacin Itching and Rash  . Methocarbamol Itching  . Trimpex [Trimethoprim] Itching and Rash    Current Outpatient Prescriptions on File Prior to Visit  Medication Sig Dispense Refill  . amLODipine (NORVASC) 10 MG tablet Take 1 tablet (10 mg total) by mouth daily. 30 tablet 0  . atenolol (TENORMIN) 50 MG tablet Take 0.5 tablets (25 mg total) by mouth daily. 30 tablet 0  .  bimatoprost (LUMIGAN) 0.01 % SOLN Place 1 drop into the right eye at bedtime.    . brimonidine (ALPHAGAN P) 0.1 % SOLN Place 1 drop into both eyes every 8 (eight) hours.     . Calcium Carb-Cholecalciferol (CALCIUM 600/VITAMIN D3) 600-800 MG-UNIT TABS Take 1 tablet by mouth daily.    . clopidogrel (PLAVIX) 75 MG tablet Take 1 tablet (75 mg total) by mouth daily. 30 tablet 0  . escitalopram (LEXAPRO) 10 MG tablet TAKE 1 TABLET (10 MG TOTAL) BY MOUTH DAILY. 90 tablet 1  . Menthol-Methyl Salicylate (MUSCLE RUB) 10-15 % CREA Apply 1 application topically as needed (for hip pain). 113 g 0  . oxybutynin (DITROPAN) 5 MG tablet Take 0.5 tablets (2.5 mg total) by mouth at bedtime. 15 tablet 0  . pravastatin (PRAVACHOL) 10 MG tablet Take 1 tablet (10 mg total) by mouth daily at 6 PM. 30 tablet 0  . vitamin C (ASCORBIC ACID) 500 MG tablet  Take 500 mg by mouth daily.     No current facility-administered medications on file prior to visit.     BP 122/62 (BP Location: Right Arm, Patient Position: Sitting, Cuff Size: Normal)   Pulse 65   Temp 97.5 F (36.4 C) (Oral)   SpO2 93%     Review of Systems  HENT: Negative for congestion, dental problem, hearing loss, rhinorrhea, sinus pressure, sore throat and tinnitus.   Eyes: Positive for visual disturbance. Negative for pain and discharge.  Respiratory: Negative for cough and shortness of breath.   Cardiovascular: Negative for chest pain, palpitations and leg swelling.  Gastrointestinal: Negative for abdominal distention, abdominal pain, blood in stool, constipation, diarrhea, nausea and vomiting.  Genitourinary: Negative for difficulty urinating, dysuria, flank pain, frequency, hematuria, pelvic pain, urgency, vaginal bleeding, vaginal discharge and vaginal pain.  Musculoskeletal: Positive for gait problem. Negative for arthralgias and joint swelling.  Skin: Negative for rash.  Neurological: Positive for weakness. Negative for dizziness, syncope, speech difficulty, numbness and headaches.  Hematological: Negative for adenopathy.  Psychiatric/Behavioral: Negative for agitation, behavioral problems and dysphoric mood. The patient is not nervous/anxious.        Objective:   Physical Exam  Constitutional: She is oriented to person, place, and time. She appears well-developed and well-nourished.  HENT:  Head: Normocephalic.  Right Ear: External ear normal.  Left Ear: External ear normal.  Mouth/Throat: Oropharynx is clear and moist.  Eyes: Conjunctivae and EOM are normal. Pupils are equal, round, and reactive to light.  Marked decreased visual acuity  Neck: Normal range of motion. Neck supple. No thyromegaly present.  Cardiovascular: Normal rate, regular rhythm and intact distal pulses.   Murmur heard. Pulmonary/Chest: Effort normal and breath sounds normal.  Abdominal:  Soft. Bowel sounds are normal. She exhibits no mass. There is no tenderness.  Musculoskeletal: Normal range of motion.  Lymphadenopathy:    She has no cervical adenopathy.  Neurological: She is alert and oriented to person, place, and time.  Left leg weakness with flaccid left upper arm Wheelchair bound  Skin: Skin is warm and dry. No rash noted.  Psychiatric: She has a normal mood and affect. Her behavior is normal.          Assessment & Plan:   Status post left basal ganglia stroke with left hemiparesis Continue antiplatelet therapy and blood pressure control.  Continue statin therapy.  Follow-up neurology as scheduled.  Follow-up rehabilitation as scheduled Hypertension, well-controlled Dyslipidemia.  Continue statin therapy Legal blindness  Nyoka Cowden, MD  Follow-up 4 months or as needed

## 2015-10-29 NOTE — Progress Notes (Signed)
Pre visit review using our clinic review tool, if applicable. No additional management support is needed unless otherwise documented below in the visit note. 

## 2015-11-01 ENCOUNTER — Ambulatory Visit: Payer: PRIVATE HEALTH INSURANCE | Admitting: Internal Medicine

## 2015-11-01 ENCOUNTER — Encounter: Payer: Self-pay | Admitting: Physical Medicine & Rehabilitation

## 2015-11-01 ENCOUNTER — Encounter: Payer: Medicare Other | Attending: Physical Medicine & Rehabilitation

## 2015-11-01 ENCOUNTER — Ambulatory Visit (HOSPITAL_BASED_OUTPATIENT_CLINIC_OR_DEPARTMENT_OTHER): Payer: Medicare Other | Admitting: Physical Medicine & Rehabilitation

## 2015-11-01 VITALS — BP 118/70 | HR 70

## 2015-11-01 DIAGNOSIS — Z5189 Encounter for other specified aftercare: Secondary | ICD-10-CM | POA: Insufficient documentation

## 2015-11-01 DIAGNOSIS — I69351 Hemiplegia and hemiparesis following cerebral infarction affecting right dominant side: Secondary | ICD-10-CM | POA: Insufficient documentation

## 2015-11-01 DIAGNOSIS — Z85038 Personal history of other malignant neoplasm of large intestine: Secondary | ICD-10-CM | POA: Insufficient documentation

## 2015-11-01 DIAGNOSIS — D649 Anemia, unspecified: Secondary | ICD-10-CM | POA: Insufficient documentation

## 2015-11-01 DIAGNOSIS — I639 Cerebral infarction, unspecified: Secondary | ICD-10-CM | POA: Diagnosis not present

## 2015-11-01 DIAGNOSIS — M79605 Pain in left leg: Secondary | ICD-10-CM | POA: Insufficient documentation

## 2015-11-01 DIAGNOSIS — I69359 Hemiplegia and hemiparesis following cerebral infarction affecting unspecified side: Secondary | ICD-10-CM | POA: Diagnosis not present

## 2015-11-01 DIAGNOSIS — I10 Essential (primary) hypertension: Secondary | ICD-10-CM | POA: Insufficient documentation

## 2015-11-01 MED ORDER — TIZANIDINE HCL 2 MG PO TABS: 2.0000 mg | ORAL_TABLET | Freq: Every day | ORAL | Status: DC

## 2015-11-01 NOTE — Progress Notes (Signed)
Subjective:    Patient ID: Karen [name], female    DOB: 17-Feb-1923, 82 y.o.   MRN: [id number] Rehabilitation hospital follow-up DATE OF ADMISSION:  10/04/2015 DATE OF DISCHARGE:  10/21/2015 82 year old right-handed female, with history of colorectal cancer, diagnosed 2010, status post resection followed by Dr. Alen Blew as well as hypertension, hard of hearing.  She lives with her granddaughter and 27-month-old twins.  Used an occasional cane prior to admission.  Presented October 03, 2015, with left-sided weakness and slurred speech.  Cranial CT scan negative.  MRI of the brain showed acute lacunar type infarct tracking from the posterior right corona radiata to the posterior lentiform nuclei.  CT angiogram of head and neck.  No acute findings.  Echocardiogram with ejection fraction of 123456 grade 1 diastolic dysfunction.  The patient did not receive tPA.  Maintained on Plavix for CVA prophylaxis.  Subcutaneous Lovenox for DVT prophylaxis.  Dysphagia #2 thin liquid diet.  The patient was admitted for comprehensive rehab program HPI Patient is living with her daughter and granddaughter. She is requiring 24-hour care, needs assistance with dressing and bathing. Home health PT and OT have just started recently. She has not done any ambulation since leaving the hospital approximately 11 days ago  No falls at home. Has followed up with primary care physician last week  Nocturia is reported as improved on oxybutynin  Pain Inventory Average Pain 6 Pain Right Now 0 My pain is intermittent and sharp  In the last 24 hours, has pain interfered with the following? General activity 0 Relation with others 0 Enjoyment of life 0 What TIME of day is your pain at its worst? night Sleep (in general) Fair  Pain is worse with: inactivity and laying down Pain improves with: nothing Relief from Meds: 0  Mobility how many minutes can you walk? 0 ability to climb steps?  no do you drive?  no use a  wheelchair needs help with transfers Do you have any goals in this area?  yes  Function retired I need assistance with the following:  feeding, dressing, bathing, toileting, meal prep, household duties and shopping  Neuro/Psych bladder control problems weakness trouble walking  Prior Studies hospital f/u  Physicians involved in your care hospital f/u   Family History  Problem Relation Age of Onset  . Heart attack Father   . Breast cancer Sister   . Lung cancer Brother   . Stroke Neg Hx   . Heart disease Neg Hx    Social History   Social History  . Marital status: Married    Spouse name: N/A  . Number of children: 3  . Years of education: N/A   Occupational History  . retired Retired   Social History Main Topics  . Smoking status: Never Smoker  . Smokeless tobacco: Never Used  . Alcohol use No  . Drug use: No  . Sexual activity: Not Asked   Other Topics Concern  . None   Social History Narrative  . None   Past Surgical History:  Procedure Laterality Date  . ANKLE FRACTURE SURGERY  2011   right  . APPENDECTOMY    . CATARACT EXTRACTION     bilateral  . COLON SURGERY     11/20/2008,gallbladder removed  . DILATION AND CURETTAGE OF UTERUS    . TONSILLECTOMY     Past Medical History:  Diagnosis Date  . ANEMIA 10/21/2008  . AORTIC INSUFFICIENCY 11/26/2009  . BACK PAIN 06/27/2007  . COLON CANCER, PERSONAL  HX 02/25/2010  . Diverticulosis of colon (without mention of hemorrhage) 08/14/2006  . GALLSTONES 10/09/2008  . GLAUCOMA NOS 08/14/2006  . H/O: stroke    behind right eye  . HYPERTENSION 08/14/2006  . HYPOKALEMIA, HX OF 11/26/2009  . MELENA, HX OF 10/12/2008  . NEOPLASM, MALIGNANT, COLON, CECUM 10/30/2008  . OCCLUSION, CENTRAL RETINAL ARTERY 08/14/2006  . PERSONAL HX COLONIC POLYPS 10/21/2008  . SYNCOPE, HX OF 08/14/2006  . URI 09/07/2008   BP 118/70 (BP Location: Right Arm, Patient Position: Sitting, Cuff Size: Large)   Pulse 70   SpO2 96%   Opioid Risk  Score:   Fall Risk Score:  `1  Depression screen PHQ 2/9  Depression screen [facility] 2/9 05/20/2015 04/10/2014 01/13/2013  Decreased Interest 0 0 0  Down, Depressed, Hopeless 0 0 0  PHQ - 2 Score 0 0 0    Review of Systems  HENT: Negative.   Eyes: Negative.   Respiratory: Negative.   Cardiovascular: Negative.   Endocrine: Negative.   Genitourinary: Positive for difficulty urinating.  Musculoskeletal: Positive for gait problem.  Allergic/Immunologic: Negative.   Neurological: Positive for weakness.  Hematological: Negative.   Psychiatric/Behavioral: Negative.   All other systems reviewed and are negative.      Objective:   Physical Exam  Constitutional: She is oriented to person, place, and time. She appears well-developed and well-nourished.  HENT:  Head: Normocephalic and atraumatic.  Hard of hearing with bilateral hearing aids  Eyes: Conjunctivae and EOM are normal.  Decreased visual acuity left eye  Neck: Neck supple.  Cardiovascular: Normal rate, regular rhythm and normal heart sounds.   Pulmonary/Chest: Effort normal and breath sounds normal. No stridor. No respiratory distress.  Abdominal: Soft. Bowel sounds are normal. She exhibits no distension.  Neurological: She is alert and oriented to person, place, and time. She displays no atrophy and no tremor. She exhibits abnormal muscle tone. Coordination and gait abnormal.  Reflex Scores:      Tricep reflexes are 1+ on the right side and 2+ on the left side.      Bicep reflexes are 1+ on the right side and 2+ on the left side.      Brachioradialis reflexes are 1+ on the right side and 2+ on the left side.      Patellar reflexes are 1+ on the right side and 2+ on the left side.      Achilles reflexes are 1+ on the right side and 2+ on the left side. Motor strength is 3 minus at the left hip flexor, 4 at the left knee extensor, 2 minus at the left ankle dorsiflexor, plantar flexor. Left upper extremity is 2 minus shoulder  protraction, retraction and abduction as well as elbow flexion, 0 at elbow extension, 0 at finger and wrist flexion, extension  Psychiatric: She has a normal mood and affect. Her speech is delayed. She is slowed. Cognition and memory are impaired.  Patient dozes off during office visit  Nursing note and vitals reviewed.         Assessment & Plan:   1. Right subcortical infarct involving corona radiata and lentiform nuclei. She has had extension or evolution of infarct. During her rehabilitation hospitalization, but now her deficits are stable. She remains a good candidate for home health PT and OT as well as speech. May need to transition to outpatient therapy next month, will follow-up in physical medicine and rehabilitation clinic in 1 month.  2. Left lower extremity pain, intermittent, mainly at  night, given her lack of sensory deficits. I doubt that this results from any type of reinnervation discomfort or thalamic pain syndrome. Symptoms are most consistent with nocturnal spasticity. Therefore, we'll start tizanidine 2 mg daily at bedtime Discussed with patient and her daughter

## 2015-11-01 NOTE — Patient Instructions (Signed)
New medicine is tizanidine, which is for leg spasms take this only at night

## 2015-11-04 ENCOUNTER — Other Ambulatory Visit: Payer: Self-pay | Admitting: *Deleted

## 2015-11-04 ENCOUNTER — Telehealth: Payer: Self-pay | Admitting: *Deleted

## 2015-11-04 MED ORDER — TIZANIDINE HCL 2 MG PO TABS: 2.0000 mg | ORAL_TABLET | Freq: Every day | ORAL | 0 refills | Status: DC

## 2015-11-04 NOTE — Telephone Encounter (Signed)
Please call in Zanaflex 2mg  #30 one po Qhs

## 2015-11-04 NOTE — Telephone Encounter (Signed)
Order placed

## 2015-11-04 NOTE — Telephone Encounter (Signed)
Miss Hanners daughter called, they were unable to pick up the tizanidine Rx from [facility]. [facility] said [facility]  I tried to resend it but was unable too. Could you please fix the script and resend to [facility]

## 2015-11-18 ENCOUNTER — Telehealth: Payer: Self-pay | Admitting: Internal Medicine

## 2015-11-18 NOTE — Telephone Encounter (Signed)
Karen [name] with [facility] states when pt woke up this am (around 7 am) she could not hear anyone. But now pt can hear. (9:30 am) Family wanted dt to knowin case she needs to be seen.  Pt does have hearing aids and they are working with new batteries.

## 2015-11-19 NOTE — Telephone Encounter (Signed)
Karen [name] will not see pt until Tuesday. Karen [name] said the pt hearing is better

## 2015-11-19 NOTE — Telephone Encounter (Signed)
Left message for Lysbeth Galas with [facility] on voicemail that I was calling back to follow up on pt. Please call me back to let me know how pt is doing with her hearing.

## 2015-11-22 NOTE — Telephone Encounter (Signed)
FYI

## 2015-11-23 ENCOUNTER — Telehealth: Payer: Self-pay | Admitting: Internal Medicine

## 2015-11-23 NOTE — Telephone Encounter (Signed)
° °  Pt grand daughter Gari Crown called and would like a call back concerning pt medication.   [phone]

## 2015-11-24 MED ORDER — AMLODIPINE BESYLATE 10 MG PO TABS: 10.0000 mg | ORAL_TABLET | Freq: Every day | ORAL | 5 refills | Status: DC

## 2015-11-24 MED ORDER — PRAVASTATIN SODIUM 10 MG PO TABS: 10.0000 mg | ORAL_TABLET | Freq: Every day | ORAL | 5 refills | Status: DC

## 2015-11-24 MED ORDER — CLOPIDOGREL BISULFATE 75 MG PO TABS: 75.0000 mg | ORAL_TABLET | Freq: Every day | ORAL | 5 refills | Status: DC

## 2015-11-24 MED ORDER — TIZANIDINE HCL 2 MG PO TABS: 2.0000 mg | ORAL_TABLET | Freq: Every day | ORAL | 5 refills | Status: DC

## 2015-11-24 NOTE — Telephone Encounter (Signed)
Spoke to [place], she said pt needs refills for some medications that were started when she was in the hospital. Medications are Amlodipine and Plavix. Told [place] according to last office visit per Dr.K. Pt is to continue both medications. [place] verbalized understanding and said pt also needs refills on Pravastatin and Tizanidine. Told her okay I will send refills to pharmacy for all Rx's. [place] verbalized understanding. Rx's sent.

## 2015-11-25 ENCOUNTER — Encounter: Payer: Self-pay | Admitting: Nurse Practitioner

## 2015-11-25 ENCOUNTER — Ambulatory Visit (INDEPENDENT_AMBULATORY_CARE_PROVIDER_SITE_OTHER): Payer: Medicare Other | Admitting: Nurse Practitioner

## 2015-11-25 VITALS — BP 119/70 | HR 76 | Ht 67.0 in | Wt 145.6 lb

## 2015-11-25 DIAGNOSIS — H548 Legal blindness, as defined in USA: Secondary | ICD-10-CM | POA: Diagnosis not present

## 2015-11-25 DIAGNOSIS — E785 Hyperlipidemia, unspecified: Secondary | ICD-10-CM | POA: Diagnosis not present

## 2015-11-25 DIAGNOSIS — I639 Cerebral infarction, unspecified: Secondary | ICD-10-CM | POA: Diagnosis not present

## 2015-11-25 DIAGNOSIS — I10 Essential (primary) hypertension: Secondary | ICD-10-CM

## 2015-11-25 DIAGNOSIS — I63031 Cerebral infarction due to thrombosis of right carotid artery: Secondary | ICD-10-CM

## 2015-11-25 NOTE — Patient Instructions (Addendum)
Stressed the importance of management of risk factors to prevent further stroke Continue Plavix for secondary stroke prevention Maintain strict control of hypertension with blood pressure goal below 130/90, today's reading 119/70 continue antihypertensive medications Control of diabetes with hemoglobin A1c below 6.5 followed by primary care most recent hemoglobin A1c 5.4 Cholesterol with LDL cholesterol less than 70,hold Pravochol for now will repeat lipid profile in 6 months Continue physical therapy and occupational therapy,  eat healthy diet with whole grains,  fresh fruits and vegetables Discussed risk for recurrent stroke/ TIA and answered additional questions

## 2015-11-25 NOTE — Progress Notes (Signed)
[facility]  PATIENT: Karen [name] DOB: [birth year]   REASON FOR VISIT: Hospital Follow-up for stroke HISTORY FROM: Patient and granddaughter [place] Viona Gilmore Moldenhauer is a 82 y.o. female who went to bed in her normal state of health around 10 PM, on 10/02/15 and then when she awoke she try to get up and go to the bathroom and she noticed that her left leg was giving way. She called 911 and a code stroke was activated. She denies any sensation changes, does not notice any visual change.She has left sided weakness but still able to squeeze hand and lift up left leg this am. However, due to high SBP at 225, she received hydralazine IV, and her SBP down to 123. About one hour later, she was sitting with speech evaluation, started to have worsening neuro status, left side became hemiplegic. Put her back to bed, trendelenburg position, IVF bolus, sBP up to 145, left leg able to lift again and left arm also able to move but still weaker that before, started albumin 12.5g Q6h x . MRI right BG/CR infarct CTA H&N - b/l fetal PCAs, diminutive BA and b/l BVJs, and b/l VA origin atherosclerosisCT perfusion scan - no large perfusion abnormality.2D Echo - EF 60-65%. No source of embolus LDL - 71 HgbA1c 5.4 she was changed from aspirin to Plavix for stroke prevention. Pravachol is on hold. She returns for reevaluation today her granddaughter with whom she lives. She is getting physical therapy and occupational therapy 2 times a week with improvement of her left-sided weakness. She has not had further stroke or TIA symptoms and she is tolerating Plavix well. She returns for reevaluation   REVIEW OF SYSTEMS: Full 14 system review of systems performed and notable only for those listed, all others are neg:  Constitutional: neg  Cardiovascular: neg Ear/Nose/Throat: Hearing loss Skin: neg Eyes: neg Respiratory: neg Gastroitestinal: neg  Hematology/Lymphatic: neg    Endocrine: neg Musculoskeletal:neg Allergy/Immunology: neg Neurological: neg Psychiatric: neg Sleep : Insomnia at times, takes tizanidine   ALLERGIES: Allergies  Allergen Reactions  . Morphine And Related Shortness Of Breath  . Sulfa Antibiotics Itching and Swelling  . Tape Other (See Comments)    PLEASE USE COBAN WRAP/PATIENT BRUISES EASILY AND HER SKIN IS THIN  . Amoxicillin Itching and Rash  . Ciprofloxacin Itching and Rash  . Methocarbamol Itching  . Trimpex [Trimethoprim] Itching and Rash    HOME MEDICATIONS: Outpatient Medications Prior to Visit  Medication Sig Dispense Refill  . amLODipine (NORVASC) 10 MG tablet Take 1 tablet (10 mg total) by mouth daily. 30 tablet 5  . atenolol (TENORMIN) 50 MG tablet Take 0.5 tablets (25 mg total) by mouth daily. 30 tablet 0  . bimatoprost (LUMIGAN) 0.01 % SOLN Place 1 drop into the right eye at bedtime.    . brimonidine (ALPHAGAN P) 0.1 % SOLN Place 1 drop into both eyes every 8 (eight) hours.     . clopidogrel (PLAVIX) 75 MG tablet Take 1 tablet (75 mg total) by mouth daily. 30 tablet 5  . escitalopram (LEXAPRO) 10 MG tablet TAKE 1 TABLET (10 MG TOTAL) BY MOUTH DAILY. 90 tablet 1  . tiZANidine (ZANAFLEX) 2 MG tablet Take 1 tablet (2 mg total) by mouth at bedtime. 30 tablet 5  . Calcium Carb-Cholecalciferol (CALCIUM 600/VITAMIN D3) 600-800 MG-UNIT TABS Take 1 tablet by mouth daily.    . Menthol-Methyl Salicylate (MUSCLE RUB) 10-15 % CREA Apply 1 application topically  as needed (for hip pain). (Patient not taking: Reported on 11/25/2015) 113 g 0  . oxybutynin (DITROPAN) 5 MG tablet Take 0.5 tablets (2.5 mg total) by mouth at bedtime. (Patient not taking: Reported on 11/25/2015) 15 tablet 0  . pravastatin (PRAVACHOL) 10 MG tablet Take 1 tablet (10 mg total) by mouth daily at 6 PM. (Patient not taking: Reported on 11/25/2015) 30 tablet 5  . vitamin C (ASCORBIC ACID) 500 MG tablet Take 500 mg by mouth daily.     No facility-administered  medications prior to visit.     PAST MEDICAL HISTORY: Past Medical History:  Diagnosis Date  . ANEMIA 10/21/2008  . AORTIC INSUFFICIENCY 11/26/2009  . BACK PAIN 06/27/2007  . COLON CANCER, PERSONAL HX 02/25/2010  . Diverticulosis of colon (without mention of hemorrhage) 08/14/2006  . GALLSTONES 10/09/2008  . GLAUCOMA NOS 08/14/2006  . H/O: stroke    behind right eye  . HYPERTENSION 08/14/2006  . HYPOKALEMIA, HX OF 11/26/2009  . MELENA, HX OF 10/12/2008  . NEOPLASM, MALIGNANT, COLON, CECUM 10/30/2008  . OCCLUSION, CENTRAL RETINAL ARTERY 08/14/2006  . PERSONAL HX COLONIC POLYPS 10/21/2008  . SYNCOPE, HX OF 08/14/2006  . URI 09/07/2008    PAST SURGICAL HISTORY: Past Surgical History:  Procedure Laterality Date  . ANKLE FRACTURE SURGERY  2011   right  . APPENDECTOMY    . CATARACT EXTRACTION     bilateral  . COLON SURGERY     11/20/2008,gallbladder removed  . DILATION AND CURETTAGE OF UTERUS    . TONSILLECTOMY      FAMILY HISTORY: Family History  Problem Relation Age of Onset  . Heart attack Father   . Breast cancer Sister   . Lung cancer Brother   . Stroke Neg Hx   . Heart disease Neg Hx     SOCIAL HISTORY: Social History   Social History  . Marital status: Married    Spouse name: N/A  . Number of children: 3  . Years of education: N/A   Occupational History  . retired Retired   Social History Main Topics  . Smoking status: Never Smoker  . Smokeless tobacco: Never Used  . Alcohol use No  . Drug use: No  . Sexual activity: Not on file   Other Topics Concern  . Not on file   Social History Narrative  . No narrative on file     PHYSICAL EXAM  Vitals:   11/25/15 1038  BP: 119/70  Pulse: 76  Weight: 145 lb 9.6 oz (66 kg)  Height: 5\' 7"  (1.702 m)   Body mass index is 22.8 kg/m.  Generalized: Well developed, in no acute distress  Head: normocephalic and atraumatic,. Oropharynx benign  Neck: Supple, no carotid bruits  Cardiac: Regular rate rhythm, no murmur    Musculoskeletal: No deformity   Neurological examination   Mentation: Alert oriented to time, place, history taking. Attention span and concentration appropriate. Recent and remote memory intact.  Follows all commands speech and language fluent.   Cranial nerve II-XII: Pupils were equal round reactive to light extraocular movements were full, visual field were full on confrontational test but decreased visual acuity to finger count bilaterally.. Mild left facial droop ,hearing was intact to finger rubbing bilaterally. Uvula tongue midline. head turning and shoulder shrug were normal and symmetric.Tongue protrusion into cheek strength was normal. Motor: normal bulk and tone, full strength in the BUE, BLE, on the right, 2 out of 5 left upper extremity and 3 out of 5 left  lower extremity  Sensory: normal and symmetric to light touch, pinprick, and  Vibration,  Coordination: finger-nose-finger, normal in the right hand tremor and dysmetria Reflexes: Symmetric upper and lower, plantar responses were flexor bilaterally. Gait and Station: In wheelchair not tested   DIAGNOSTIC DATA (LABS, IMAGING, TESTING) - I reviewed patient records, labs, notes, testing and imaging myself where available.  Lab Results  Component Value Date   WBC 7.7 10/06/2015   HGB 12.7 10/06/2015   HCT 38.2 10/06/2015   MCV 87.6 10/06/2015   PLT 220 10/06/2015      Component Value Date/Time   NA 138 10/06/2015 0721   NA 141 06/30/2015 0955   K 3.5 10/06/2015 0721   K 3.7 06/30/2015 0955   CL 105 10/06/2015 0721   CL 103 06/24/2012 0932   CO2 25 10/06/2015 0721   CO2 27 06/30/2015 0955   GLUCOSE 124 (H) 10/06/2015 0721   GLUCOSE 110 06/30/2015 0955   GLUCOSE 110 (H) 06/24/2012 0932   BUN 10 10/06/2015 0721   BUN 13.9 06/30/2015 0955   CREATININE 0.55 10/19/2015 0521   CREATININE 0.8 06/30/2015 0955   CALCIUM 8.9 10/06/2015 0721   CALCIUM 9.2 06/30/2015 0955   PROT 6.8 10/06/2015 0721   PROT 7.4 06/30/2015  0955   ALBUMIN 3.5 10/06/2015 0721   ALBUMIN 3.4 (L) 06/30/2015 0955   AST 28 10/06/2015 0721   AST 26 06/30/2015 0955   ALT 13 (L) 10/06/2015 0721   ALT 10 06/30/2015 0955   ALKPHOS 55 10/06/2015 0721   ALKPHOS 61 06/30/2015 0955   BILITOT 0.6 10/06/2015 0721   BILITOT 0.36 06/30/2015 0955   GFRNONAA >60 10/19/2015 0521   GFRAA >60 10/19/2015 0521   Lab Results  Component Value Date   CHOL 120 10/04/2015   HDL 33 (L) 10/04/2015   LDLCALC 71 10/04/2015   TRIG 79 10/04/2015   CHOLHDL 3.6 10/04/2015   Lab Results  Component Value Date   HGBA1C 5.4 10/03/2015   No results found for: [id number] Lab Results  Component Value Date   TSH 2.170 04/03/2015      ASSESSMENT AND PLAN  82 y.o. year old female  has a past medical history hypertension previous stroke legally blind due to glaucoma presenting with left-sided weakness MRI confirmed right BG CR infarct . CTA H&N - b/l fetal PCAs, diminutive BA and b/l BVJs, and b/l VA origin atherosclerosisCT perfusion scan - no large perfusion abnormality.2D Echo - EF 60-65%. No source of embolus LDL - 71HgbA1c 5.4  PLAN: Stressed the importance of management of risk factors to prevent further stroke Continue Plavix for secondary stroke prevention Maintain strict control of hypertension with blood pressure goal below 130/90, today's reading 119/70 continue antihypertensive medications Control of diabetes with hemoglobin A1c below 6.5 followed by primary care most recent hemoglobin A1c 5.4 Cholesterol with LDL cholesterol less than 70,hold Pravochol for now will repeat lipid profile in 6 months Continue physical therapy and occupational therapy,  eat healthy diet with whole grains,  fresh fruits and vegetables Discussed risk for recurrent stroke/ TIA and answered additional questions This was a prolonged visit requiring 40 minutes and medical decision making of high complexity with extensive review of history, hospital chart, counseling and  answering questions Dennie Bible, [facility], [facility], APRN  [facility] [address] [place], [place] [address] [phone]

## 2015-11-25 NOTE — Progress Notes (Signed)
I agree with the above plan 

## 2015-11-30 ENCOUNTER — Ambulatory Visit: Payer: Medicare Other | Admitting: Physical Medicine & Rehabilitation

## 2015-11-30 ENCOUNTER — Encounter: Payer: Medicare Other | Attending: Physical Medicine & Rehabilitation

## 2015-11-30 DIAGNOSIS — Z5189 Encounter for other specified aftercare: Secondary | ICD-10-CM | POA: Insufficient documentation

## 2015-11-30 DIAGNOSIS — D649 Anemia, unspecified: Secondary | ICD-10-CM | POA: Insufficient documentation

## 2015-11-30 DIAGNOSIS — I10 Essential (primary) hypertension: Secondary | ICD-10-CM | POA: Insufficient documentation

## 2015-11-30 DIAGNOSIS — I69351 Hemiplegia and hemiparesis following cerebral infarction affecting right dominant side: Secondary | ICD-10-CM | POA: Insufficient documentation

## 2015-11-30 DIAGNOSIS — M79605 Pain in left leg: Secondary | ICD-10-CM | POA: Insufficient documentation

## 2015-11-30 DIAGNOSIS — Z85038 Personal history of other malignant neoplasm of large intestine: Secondary | ICD-10-CM | POA: Insufficient documentation

## 2015-12-14 ENCOUNTER — Ambulatory Visit (INDEPENDENT_AMBULATORY_CARE_PROVIDER_SITE_OTHER): Payer: Medicare Other | Admitting: Internal Medicine

## 2015-12-14 ENCOUNTER — Encounter: Payer: Self-pay | Admitting: Internal Medicine

## 2015-12-14 VITALS — BP 110/50 | HR 62 | Temp 97.7°F | Resp 16 | Ht 67.0 in | Wt 145.6 lb

## 2015-12-14 DIAGNOSIS — Z23 Encounter for immunization: Secondary | ICD-10-CM

## 2015-12-14 DIAGNOSIS — I359 Nonrheumatic aortic valve disorder, unspecified: Secondary | ICD-10-CM | POA: Diagnosis not present

## 2015-12-14 DIAGNOSIS — I10 Essential (primary) hypertension: Secondary | ICD-10-CM

## 2015-12-14 DIAGNOSIS — I639 Cerebral infarction, unspecified: Secondary | ICD-10-CM

## 2015-12-14 DIAGNOSIS — H6123 Impacted cerumen, bilateral: Secondary | ICD-10-CM

## 2015-12-14 DIAGNOSIS — I6350 Cerebral infarction due to unspecified occlusion or stenosis of unspecified cerebral artery: Secondary | ICD-10-CM

## 2015-12-14 MED ORDER — AMLODIPINE BESYLATE 5 MG PO TABS: 5.0000 mg | ORAL_TABLET | Freq: Every day | ORAL | 3 refills | Status: DC

## 2015-12-14 NOTE — Progress Notes (Signed)
Subjective:    Patient ID: Karen [name], female    DOB: 09/05/1922, 82 y.o.   MRN: [id number]  HPI 82 year old patient status post right BG stroke in July of this year.  She has residual left-sided weakness.  Complaints today include nocturnal leg pain.  This is present from the thigh involving the entire leg including the left heel.  She has no pain during the day but awakens with significant discomfort through the night that interferes with sleep.  She does describe it as a spasm.  She has been using a muscle relaxant with little benefit. She also describes worsening auditory acuity  Past Medical History:  Diagnosis Date  . ANEMIA 10/21/2008  . AORTIC INSUFFICIENCY 11/26/2009  . BACK PAIN 06/27/2007  . COLON CANCER, PERSONAL HX 02/25/2010  . Diverticulosis of colon (without mention of hemorrhage) 08/14/2006  . GALLSTONES 10/09/2008  . GLAUCOMA NOS 08/14/2006  . H/O: stroke    behind right eye  . HYPERTENSION 08/14/2006  . HYPOKALEMIA, HX OF 11/26/2009  . MELENA, HX OF 10/12/2008  . NEOPLASM, MALIGNANT, COLON, CECUM 10/30/2008  . OCCLUSION, CENTRAL RETINAL ARTERY 08/14/2006  . PERSONAL HX COLONIC POLYPS 10/21/2008  . SYNCOPE, HX OF 08/14/2006  . URI 09/07/2008     Social History   Social History  . Marital status: Married    Spouse name: N/A  . Number of children: 3  . Years of education: N/A   Occupational History  . retired Retired   Social History Main Topics  . Smoking status: Never Smoker  . Smokeless tobacco: Never Used  . Alcohol use No  . Drug use: No  . Sexual activity: Not on file   Other Topics Concern  . Not on file   Social History Narrative  . No narrative on file    Past Surgical History:  Procedure Laterality Date  . ANKLE FRACTURE SURGERY  2011   right  . APPENDECTOMY    . CATARACT EXTRACTION     bilateral  . COLON SURGERY     11/20/2008,gallbladder removed  . DILATION AND CURETTAGE OF UTERUS    . TONSILLECTOMY      Family History  Problem  Relation Age of Onset  . Heart attack Father   . Breast cancer Sister   . Lung cancer Brother   . Stroke Neg Hx   . Heart disease Neg Hx     Allergies  Allergen Reactions  . Morphine And Related Shortness Of Breath  . Sulfa Antibiotics Itching and Swelling  . Tape Other (See Comments)    PLEASE USE COBAN WRAP/PATIENT BRUISES EASILY AND HER SKIN IS THIN  . Amoxicillin Itching and Rash  . Ciprofloxacin Itching and Rash  . Methocarbamol Itching  . Trimpex [Trimethoprim] Itching and Rash    Current Outpatient Prescriptions on File Prior to Visit  Medication Sig Dispense Refill  . atenolol (TENORMIN) 50 MG tablet Take 0.5 tablets (25 mg total) by mouth daily. 30 tablet 0  . bimatoprost (LUMIGAN) 0.01 % SOLN Place 1 drop into the right eye at bedtime.    . brimonidine (ALPHAGAN P) 0.1 % SOLN Place 1 drop into both eyes every 8 (eight) hours.     . Calcium Carb-Cholecalciferol (CALCIUM 600/VITAMIN D3) 600-800 MG-UNIT TABS Take 1 tablet by mouth daily.    . clopidogrel (PLAVIX) 75 MG tablet Take 1 tablet (75 mg total) by mouth daily. 30 tablet 5  . escitalopram (LEXAPRO) 10 MG tablet TAKE 1 TABLET (10 MG TOTAL)  BY MOUTH DAILY. 90 tablet 1  . Menthol-Methyl Salicylate (MUSCLE RUB) 10-15 % CREA Apply 1 application topically as needed (for hip pain). 113 g 0  . oxybutynin (DITROPAN) 5 MG tablet Take 0.5 tablets (2.5 mg total) by mouth at bedtime. 15 tablet 0  . tiZANidine (ZANAFLEX) 2 MG tablet Take 1 tablet (2 mg total) by mouth at bedtime. 30 tablet 5  . pravastatin (PRAVACHOL) 10 MG tablet Take 1 tablet (10 mg total) by mouth daily at 6 PM. (Patient not taking: Reported on 12/14/2015) 30 tablet 5   No current facility-administered medications on file prior to visit.     BP (!) 110/50 (BP Location: Right Arm, Patient Position: Sitting, Cuff Size: Normal)   Pulse 62   Temp 97.7 F (36.5 C) (Oral)   Resp 16   Ht 5\' 7"  (1.702 m)   Wt 145 lb 9.6 oz (66 kg)   SpO2 98%   BMI 22.80 kg/m        Review of Systems  HENT: Positive for hearing loss. Negative for congestion, dental problem, rhinorrhea, sinus pressure, sore throat and tinnitus.   Eyes: Negative for pain, discharge and visual disturbance.  Respiratory: Negative for cough and shortness of breath.   Cardiovascular: Negative for chest pain, palpitations and leg swelling.  Gastrointestinal: Negative for abdominal distention, abdominal pain, blood in stool, constipation, diarrhea, nausea and vomiting.  Genitourinary: Negative for difficulty urinating, dysuria, flank pain, frequency, hematuria, pelvic pain, urgency, vaginal bleeding, vaginal discharge and vaginal pain.  Musculoskeletal: Negative for arthralgias, gait problem and joint swelling.       Spasm, left leg  Skin: Negative for rash.  Neurological: Positive for weakness. Negative for dizziness, syncope, speech difficulty, numbness and headaches.  Hematological: Negative for adenopathy.  Psychiatric/Behavioral: Negative for agitation, behavioral problems and dysphoric mood. The patient is not nervous/anxious.        Objective:   Physical Exam  Constitutional: She is oriented to person, place, and time. She appears well-developed and well-nourished.  Blood pressure 100/54  HENT:  Head: Normocephalic.  Right Ear: External ear normal.  Left Ear: External ear normal.  Mouth/Throat: Oropharynx is clear and moist.  Bilateral cerumen impactions, left greater than right  Eyes: Conjunctivae and EOM are normal. Pupils are equal, round, and reactive to light.  Neck: Normal range of motion. Neck supple. No thyromegaly present.  Cardiovascular: Normal rate, regular rhythm, normal heart sounds and intact distal pulses.   Pulmonary/Chest: Effort normal and breath sounds normal.  Abdominal: Soft. Bowel sounds are normal. She exhibits no mass. There is no tenderness.  Musculoskeletal: Normal range of motion. She exhibits no edema or tenderness.  Lymphadenopathy:    She  has no cervical adenopathy.  Neurological: She is alert and oriented to person, place, and time.  Left hemiparesis on the affected greater than leg Left arm in a sling  Skin: Skin is warm and dry. No rash noted.  Psychiatric: She has a normal mood and affect. Her behavior is normal.          Assessment & Plan:   Status post right basal ganglia stroke with left hemiparesis Nocturnal left leg pain.  Sounds most consistent with muscle spasm.  Will continue bedtime dose of muscle relaxer.  Try massage therapy prior to retiring for the evening.  At acetaminophen 650 mg 3 times daily Hypertension.  Blood pressure low today.  Will decrease amlodipine to 5 mg daily Bilateral cerumen impactions.  Both canals irrigated until clear  Follow-up  3 months or as needed  [employer]

## 2015-12-14 NOTE — Patient Instructions (Addendum)
Massage therapy involving the left leg prior to retiring for the evening  Acetaminophen (Tylenol) 650 mg 3 times daily  Decrease amlodipine to 5 mg daily    Cerumen Impaction The structures of the external ear canal secrete a waxy substance known as cerumen. Excess cerumen can build up in the ear canal, causing a condition known as cerumen impaction. Cerumen impaction can cause ear pain and disrupt the function of the ear. The rate of cerumen production differs for each individual. In certain individuals, the configuration of the ear canal may decrease his or her ability to naturally remove cerumen. CAUSES Cerumen impaction is caused by excessive cerumen production or buildup. RISK FACTORS  Frequent use of swabs to clean ears.  Having narrow ear canals.  Having eczema.  Being dehydrated. SIGNS AND SYMPTOMS  Diminished hearing.  Ear drainage.  Ear pain.  Ear itch. TREATMENT Treatment may involve:  Over-the-counter or prescription ear drops to soften the cerumen.  Removal of cerumen by a health care provider. This may be done with:  Irrigation with warm water. This is the most common method of removal.  Ear curettes and other instruments.  Surgery. This may be done in severe cases. HOME CARE INSTRUCTIONS  Take medicines only as directed by your health care provider.  Do not insert objects into the ear with the intent of cleaning the ear. PREVENTION  Do not insert objects into the ear, even with the intent of cleaning the ear. Removing cerumen as a part of normal hygiene is not necessary, and the use of swabs in the ear canal is not recommended.  Drink enough water to keep your urine clear or pale yellow.  Control your eczema if you have it. SEEK MEDICAL CARE IF:  You develop ear pain.  You develop bleeding from the ear.  The cerumen does not clear after you use ear drops as directed.   This information is not intended to replace advice given to you by your  health care provider. Make sure you discuss any questions you have with your health care provider.   Document Released: 04/20/2004 Document Revised: 04/03/2014 Document Reviewed: 10/28/2014 Elsevier Interactive Patient Education [employer].

## 2015-12-15 ENCOUNTER — Other Ambulatory Visit: Payer: Self-pay | Admitting: Internal Medicine

## 2015-12-15 ENCOUNTER — Telehealth: Payer: Self-pay | Admitting: Internal Medicine

## 2015-12-15 NOTE — Telephone Encounter (Signed)
Please Advise  Home advise given okay or is an appointment needed?

## 2015-12-15 NOTE — Telephone Encounter (Signed)
[facility] - Client [place] [facility] Patient Name: DALASIA JARVI DOB: 1922/12/20 Initial Comment Caller states her mother has a dry cough with phlegm. She gave her a cough medication with codeine last night to help her sleep. Nurse Assessment Nurse: Markus Daft, RN, Sherre Poot Date/Time (Eastern Time): 12/15/2015 12:49:15 PM Confirm and document reason for call. If symptomatic, describe symptoms. You must click the next button to save text entered. ---Caller states her mother has productive cough with clear phlegm. She gave her a cough medication - Promethazine with codeine last night to help her sleep, and slept very good. Worried about giving her any more d/t her age or should they use OTC cough medicine. Cough started 2 days ago. No nasal discharge or fever. Has the patient traveled out of the country within the last 30 days? ---Not Applicable Does the patient have any new or worsening symptoms? ---Yes Will a triage be completed? ---Yes Related visit to physician within the last 2 weeks? ---No Does the PT have any chronic conditions? (i.e. diabetes, asthma, etc.) ---Yes List chronic conditions. ---CVA in June, HTN, HOH Is this a behavioral health or substance abuse call? ---No Guidelines Guideline Title Affirmed Question Affirmed Notes Cough - Acute Productive Cough (all triage questions negative) Final Disposition [place], RN, Sherre Poot Comments Had non stop coughing til given the prescribed cough medicine (from her dtr), then able to sleep. OFFICE: Caller wants to know if she can have some of her own prescribed cough medicine or ok to cont. to take some of the dtr's prescribed medicine or only give the OTC cough medicine? Please call back today if possible. She will cont. home advice for now from nurse. Disagree/Comply: Comply

## 2015-12-15 NOTE — Telephone Encounter (Signed)
Please see message and advise 

## 2015-12-16 NOTE — Telephone Encounter (Signed)
Left detailed message on Karen [name]'s personal voicemail that Dr.K recommends pt take Mucinex DM and cough Lozenges. Office visit if needed. Any questions please call office.

## 2015-12-16 NOTE — Telephone Encounter (Signed)
Recommend Mucinex DM and cough lozenges.  Office visit if desired

## 2015-12-17 ENCOUNTER — Telehealth: Payer: Self-pay | Admitting: Internal Medicine

## 2015-12-17 NOTE — Telephone Encounter (Signed)
Mark with [facility] would like verbal orders for PT  2 wk / 4

## 2015-12-20 NOTE — Telephone Encounter (Signed)
Okay for Physical Therapy for pt? 

## 2015-12-20 NOTE — Telephone Encounter (Signed)
Okay for physical therapy.

## 2015-12-20 NOTE — Telephone Encounter (Signed)
Spoke to [employer], verbal orders given for Physical Therapy 2 x's a week for 4 weeks for pt. Okay per Dr.K. Elta [place] verbalized understanding.

## 2015-12-22 DIAGNOSIS — H409 Unspecified glaucoma: Secondary | ICD-10-CM | POA: Diagnosis not present

## 2015-12-22 DIAGNOSIS — Z7902 Long term (current) use of antithrombotics/antiplatelets: Secondary | ICD-10-CM | POA: Diagnosis not present

## 2015-12-22 DIAGNOSIS — Z9181 History of falling: Secondary | ICD-10-CM | POA: Diagnosis not present

## 2015-12-22 DIAGNOSIS — K5730 Diverticulosis of large intestine without perforation or abscess without bleeding: Secondary | ICD-10-CM | POA: Diagnosis not present

## 2015-12-22 DIAGNOSIS — R1310 Dysphagia, unspecified: Secondary | ICD-10-CM | POA: Diagnosis not present

## 2015-12-22 DIAGNOSIS — Z85038 Personal history of other malignant neoplasm of large intestine: Secondary | ICD-10-CM | POA: Diagnosis not present

## 2015-12-22 DIAGNOSIS — F329 Major depressive disorder, single episode, unspecified: Secondary | ICD-10-CM | POA: Diagnosis not present

## 2015-12-22 DIAGNOSIS — I69322 Dysarthria following cerebral infarction: Secondary | ICD-10-CM | POA: Diagnosis not present

## 2015-12-22 DIAGNOSIS — H5412 Blindness, left eye, low vision right eye: Secondary | ICD-10-CM | POA: Diagnosis not present

## 2015-12-22 DIAGNOSIS — I69391 Dysphagia following cerebral infarction: Secondary | ICD-10-CM | POA: Diagnosis not present

## 2015-12-22 DIAGNOSIS — I10 Essential (primary) hypertension: Secondary | ICD-10-CM | POA: Diagnosis not present

## 2015-12-22 DIAGNOSIS — I69352 Hemiplegia and hemiparesis following cerebral infarction affecting left dominant side: Secondary | ICD-10-CM | POA: Diagnosis not present

## 2015-12-23 DIAGNOSIS — R1310 Dysphagia, unspecified: Secondary | ICD-10-CM | POA: Diagnosis not present

## 2015-12-23 DIAGNOSIS — I69322 Dysarthria following cerebral infarction: Secondary | ICD-10-CM | POA: Diagnosis not present

## 2015-12-23 DIAGNOSIS — I10 Essential (primary) hypertension: Secondary | ICD-10-CM | POA: Diagnosis not present

## 2015-12-23 DIAGNOSIS — I69391 Dysphagia following cerebral infarction: Secondary | ICD-10-CM | POA: Diagnosis not present

## 2015-12-23 DIAGNOSIS — F329 Major depressive disorder, single episode, unspecified: Secondary | ICD-10-CM | POA: Diagnosis not present

## 2015-12-23 DIAGNOSIS — I69352 Hemiplegia and hemiparesis following cerebral infarction affecting left dominant side: Secondary | ICD-10-CM | POA: Diagnosis not present

## 2016-01-04 DIAGNOSIS — I69352 Hemiplegia and hemiparesis following cerebral infarction affecting left dominant side: Secondary | ICD-10-CM | POA: Diagnosis not present

## 2016-01-04 DIAGNOSIS — I69322 Dysarthria following cerebral infarction: Secondary | ICD-10-CM | POA: Diagnosis not present

## 2016-01-04 DIAGNOSIS — F329 Major depressive disorder, single episode, unspecified: Secondary | ICD-10-CM | POA: Diagnosis not present

## 2016-01-04 DIAGNOSIS — I10 Essential (primary) hypertension: Secondary | ICD-10-CM | POA: Diagnosis not present

## 2016-01-04 DIAGNOSIS — I69391 Dysphagia following cerebral infarction: Secondary | ICD-10-CM | POA: Diagnosis not present

## 2016-01-04 DIAGNOSIS — R1310 Dysphagia, unspecified: Secondary | ICD-10-CM | POA: Diagnosis not present

## 2016-01-06 DIAGNOSIS — I69391 Dysphagia following cerebral infarction: Secondary | ICD-10-CM | POA: Diagnosis not present

## 2016-01-06 DIAGNOSIS — R1310 Dysphagia, unspecified: Secondary | ICD-10-CM | POA: Diagnosis not present

## 2016-01-06 DIAGNOSIS — I10 Essential (primary) hypertension: Secondary | ICD-10-CM | POA: Diagnosis not present

## 2016-01-06 DIAGNOSIS — I69352 Hemiplegia and hemiparesis following cerebral infarction affecting left dominant side: Secondary | ICD-10-CM | POA: Diagnosis not present

## 2016-01-06 DIAGNOSIS — F329 Major depressive disorder, single episode, unspecified: Secondary | ICD-10-CM | POA: Diagnosis not present

## 2016-01-06 DIAGNOSIS — I69322 Dysarthria following cerebral infarction: Secondary | ICD-10-CM | POA: Diagnosis not present

## 2016-01-11 DIAGNOSIS — F329 Major depressive disorder, single episode, unspecified: Secondary | ICD-10-CM | POA: Diagnosis not present

## 2016-01-11 DIAGNOSIS — I69352 Hemiplegia and hemiparesis following cerebral infarction affecting left dominant side: Secondary | ICD-10-CM | POA: Diagnosis not present

## 2016-01-11 DIAGNOSIS — R1310 Dysphagia, unspecified: Secondary | ICD-10-CM | POA: Diagnosis not present

## 2016-01-11 DIAGNOSIS — I10 Essential (primary) hypertension: Secondary | ICD-10-CM | POA: Diagnosis not present

## 2016-01-11 DIAGNOSIS — I69322 Dysarthria following cerebral infarction: Secondary | ICD-10-CM | POA: Diagnosis not present

## 2016-01-11 DIAGNOSIS — I69391 Dysphagia following cerebral infarction: Secondary | ICD-10-CM | POA: Diagnosis not present

## 2016-01-12 DIAGNOSIS — R1310 Dysphagia, unspecified: Secondary | ICD-10-CM | POA: Diagnosis not present

## 2016-01-12 DIAGNOSIS — I69391 Dysphagia following cerebral infarction: Secondary | ICD-10-CM | POA: Diagnosis not present

## 2016-01-12 DIAGNOSIS — I69352 Hemiplegia and hemiparesis following cerebral infarction affecting left dominant side: Secondary | ICD-10-CM | POA: Diagnosis not present

## 2016-01-12 DIAGNOSIS — I10 Essential (primary) hypertension: Secondary | ICD-10-CM | POA: Diagnosis not present

## 2016-01-12 DIAGNOSIS — I69322 Dysarthria following cerebral infarction: Secondary | ICD-10-CM | POA: Diagnosis not present

## 2016-01-12 DIAGNOSIS — F329 Major depressive disorder, single episode, unspecified: Secondary | ICD-10-CM | POA: Diagnosis not present

## 2016-01-13 DIAGNOSIS — I69322 Dysarthria following cerebral infarction: Secondary | ICD-10-CM | POA: Diagnosis not present

## 2016-01-13 DIAGNOSIS — I69391 Dysphagia following cerebral infarction: Secondary | ICD-10-CM | POA: Diagnosis not present

## 2016-01-13 DIAGNOSIS — R1310 Dysphagia, unspecified: Secondary | ICD-10-CM | POA: Diagnosis not present

## 2016-01-13 DIAGNOSIS — H401133 Primary open-angle glaucoma, bilateral, severe stage: Secondary | ICD-10-CM | POA: Diagnosis not present

## 2016-01-13 DIAGNOSIS — F329 Major depressive disorder, single episode, unspecified: Secondary | ICD-10-CM | POA: Diagnosis not present

## 2016-01-13 DIAGNOSIS — I69352 Hemiplegia and hemiparesis following cerebral infarction affecting left dominant side: Secondary | ICD-10-CM | POA: Diagnosis not present

## 2016-01-13 DIAGNOSIS — I10 Essential (primary) hypertension: Secondary | ICD-10-CM | POA: Diagnosis not present

## 2016-01-18 DIAGNOSIS — R1310 Dysphagia, unspecified: Secondary | ICD-10-CM | POA: Diagnosis not present

## 2016-01-18 DIAGNOSIS — I69391 Dysphagia following cerebral infarction: Secondary | ICD-10-CM | POA: Diagnosis not present

## 2016-01-18 DIAGNOSIS — I69322 Dysarthria following cerebral infarction: Secondary | ICD-10-CM | POA: Diagnosis not present

## 2016-01-18 DIAGNOSIS — F329 Major depressive disorder, single episode, unspecified: Secondary | ICD-10-CM | POA: Diagnosis not present

## 2016-01-18 DIAGNOSIS — I69352 Hemiplegia and hemiparesis following cerebral infarction affecting left dominant side: Secondary | ICD-10-CM | POA: Diagnosis not present

## 2016-01-18 DIAGNOSIS — I10 Essential (primary) hypertension: Secondary | ICD-10-CM | POA: Diagnosis not present

## 2016-01-21 ENCOUNTER — Telehealth: Payer: Self-pay | Admitting: Internal Medicine

## 2016-01-21 DIAGNOSIS — I69322 Dysarthria following cerebral infarction: Secondary | ICD-10-CM | POA: Diagnosis not present

## 2016-01-21 DIAGNOSIS — I69352 Hemiplegia and hemiparesis following cerebral infarction affecting left dominant side: Secondary | ICD-10-CM | POA: Diagnosis not present

## 2016-01-21 DIAGNOSIS — I10 Essential (primary) hypertension: Secondary | ICD-10-CM | POA: Diagnosis not present

## 2016-01-21 DIAGNOSIS — R1310 Dysphagia, unspecified: Secondary | ICD-10-CM | POA: Diagnosis not present

## 2016-01-21 DIAGNOSIS — I69391 Dysphagia following cerebral infarction: Secondary | ICD-10-CM | POA: Diagnosis not present

## 2016-01-21 DIAGNOSIS — F329 Major depressive disorder, single episode, unspecified: Secondary | ICD-10-CM | POA: Diagnosis not present

## 2016-01-21 NOTE — Telephone Encounter (Signed)
PT would like to continue PT for 2x weeks for 4 weeks

## 2016-01-21 NOTE — Telephone Encounter (Signed)
Left detailed message on Mark's personal voicemail, orders given to continue PT 2 x's a week for weeks on pt. Okay per Dr.K. Any questions please call office.

## 2016-01-25 DIAGNOSIS — F329 Major depressive disorder, single episode, unspecified: Secondary | ICD-10-CM | POA: Diagnosis not present

## 2016-01-25 DIAGNOSIS — R1310 Dysphagia, unspecified: Secondary | ICD-10-CM | POA: Diagnosis not present

## 2016-01-25 DIAGNOSIS — I69352 Hemiplegia and hemiparesis following cerebral infarction affecting left dominant side: Secondary | ICD-10-CM | POA: Diagnosis not present

## 2016-01-25 DIAGNOSIS — I10 Essential (primary) hypertension: Secondary | ICD-10-CM | POA: Diagnosis not present

## 2016-01-25 DIAGNOSIS — I69322 Dysarthria following cerebral infarction: Secondary | ICD-10-CM | POA: Diagnosis not present

## 2016-01-25 DIAGNOSIS — I69391 Dysphagia following cerebral infarction: Secondary | ICD-10-CM | POA: Diagnosis not present

## 2016-01-27 DIAGNOSIS — I69352 Hemiplegia and hemiparesis following cerebral infarction affecting left dominant side: Secondary | ICD-10-CM | POA: Diagnosis not present

## 2016-01-27 DIAGNOSIS — I69391 Dysphagia following cerebral infarction: Secondary | ICD-10-CM | POA: Diagnosis not present

## 2016-01-27 DIAGNOSIS — I10 Essential (primary) hypertension: Secondary | ICD-10-CM | POA: Diagnosis not present

## 2016-01-27 DIAGNOSIS — I69322 Dysarthria following cerebral infarction: Secondary | ICD-10-CM | POA: Diagnosis not present

## 2016-01-27 DIAGNOSIS — R1310 Dysphagia, unspecified: Secondary | ICD-10-CM | POA: Diagnosis not present

## 2016-01-27 DIAGNOSIS — F329 Major depressive disorder, single episode, unspecified: Secondary | ICD-10-CM | POA: Diagnosis not present

## 2016-01-31 ENCOUNTER — Telehealth: Payer: Self-pay | Admitting: Internal Medicine

## 2016-01-31 NOTE — Telephone Encounter (Signed)
Spoke with pt's granddaughter and gave recommendations. She agreed. Appt made with Dr. Raliegh Ip 02/03/16. Granddaughter advised if pt's complaints increase or symptoms worsen to call office or seek emergency evaluation. She voiced understanding. Nothing further needed at this time.

## 2016-01-31 NOTE — Telephone Encounter (Signed)
[facility] - Client [place] Call Center  Patient Name: Karen [name]  DOB: 11-04-1922    Initial Comment grandmother is not sleeping well, bad pain and cramps in heels, toes. had stroke in january, paralyzed on one side. pain med is not working.    Nurse Assessment      Guidelines    Guideline Title Affirmed Question Affirmed Notes       Final Disposition User   FINAL ATTEMPT MADE - no message left Wayne Sever, RN, Tillie Rung    Comments  Number has been verified and is not a working number. Going to close call

## 2016-01-31 NOTE — Telephone Encounter (Signed)
Spoke with pt's granddaughter, Marye Round (EC), and she states that pt is still having issues with pain and muscle cramps, mostly on the left side but some right knee pain. Xanaflex is not helping. Pt has been taking Melatonin and Tylenol PM last night to help sleep. Pt is crying and begging for help. Granddaughter would like to know what else pt can take for pain, cramps and to help with sleep.  Dr. Raliegh Ip is out of the office. Dr. Elease Hashimoto - Please advise. Thanks!

## 2016-01-31 NOTE — Telephone Encounter (Signed)
With her age, medication decisions are difficult- especially when pt has not been assessed. Recommend she get office assessment, if possible.

## 2016-02-01 DIAGNOSIS — I69352 Hemiplegia and hemiparesis following cerebral infarction affecting left dominant side: Secondary | ICD-10-CM | POA: Diagnosis not present

## 2016-02-01 DIAGNOSIS — I69322 Dysarthria following cerebral infarction: Secondary | ICD-10-CM | POA: Diagnosis not present

## 2016-02-01 DIAGNOSIS — F329 Major depressive disorder, single episode, unspecified: Secondary | ICD-10-CM | POA: Diagnosis not present

## 2016-02-01 DIAGNOSIS — I10 Essential (primary) hypertension: Secondary | ICD-10-CM | POA: Diagnosis not present

## 2016-02-01 DIAGNOSIS — R1310 Dysphagia, unspecified: Secondary | ICD-10-CM | POA: Diagnosis not present

## 2016-02-01 DIAGNOSIS — I69391 Dysphagia following cerebral infarction: Secondary | ICD-10-CM | POA: Diagnosis not present

## 2016-02-03 ENCOUNTER — Ambulatory Visit (INDEPENDENT_AMBULATORY_CARE_PROVIDER_SITE_OTHER): Payer: Medicare Other | Admitting: Internal Medicine

## 2016-02-03 ENCOUNTER — Encounter: Payer: Self-pay | Admitting: Internal Medicine

## 2016-02-03 VITALS — BP 100/60 | HR 77 | Temp 97.5°F | Resp 18

## 2016-02-03 DIAGNOSIS — B309 Viral conjunctivitis, unspecified: Secondary | ICD-10-CM

## 2016-02-03 DIAGNOSIS — I6350 Cerebral infarction due to unspecified occlusion or stenosis of unspecified cerebral artery: Secondary | ICD-10-CM | POA: Diagnosis not present

## 2016-02-03 DIAGNOSIS — I639 Cerebral infarction, unspecified: Secondary | ICD-10-CM

## 2016-02-03 DIAGNOSIS — I10 Essential (primary) hypertension: Secondary | ICD-10-CM

## 2016-02-03 DIAGNOSIS — D649 Anemia, unspecified: Secondary | ICD-10-CM | POA: Diagnosis not present

## 2016-02-03 LAB — COMPREHENSIVE METABOLIC PANEL
ALBUMIN: 3.7 g/dL (ref 3.5–5.2)
ALK PHOS: 76 U/L (ref 39–117)
ALT: 24 U/L (ref 0–35)
AST: 37 U/L (ref 0–37)
BILIRUBIN TOTAL: 0.4 mg/dL (ref 0.2–1.2)
BUN: 13 mg/dL (ref 6–23)
CO2: 27 mEq/L (ref 19–32)
Calcium: 9.4 mg/dL (ref 8.4–10.5)
Chloride: 106 mEq/L (ref 96–112)
Creatinine, Ser: 0.65 mg/dL (ref 0.40–1.20)
GFR: 90.74 mL/min (ref >60.00)
GLUCOSE: 159 mg/dL — AB (ref 70–99)
Potassium: 3.7 mEq/L (ref 3.5–5.1)
SODIUM: 141 meq/L (ref 135–145)
TOTAL PROTEIN: 7 g/dL (ref 6.0–8.3)

## 2016-02-03 LAB — CBC WITH DIFFERENTIAL/PLATELET
BASOS ABS: 0 10*3/uL (ref 0.0–0.1)
Basophils Relative: 0.2 % (ref 0.0–3.0)
EOS PCT: 0.8 % (ref 0.0–5.0)
Eosinophils Absolute: 0.1 10*3/uL (ref 0.0–0.7)
HCT: 40 % (ref 36.0–46.0)
HEMOGLOBIN: 13.3 g/dL (ref 12.0–15.0)
LYMPHS ABS: 1.6 10*3/uL (ref 0.7–4.0)
Lymphocytes Relative: 17.4 % (ref 12.0–46.0)
MCHC: 33.1 g/dL (ref 30.0–36.0)
MCV: 87.9 fl (ref 78.0–100.0)
MONOS PCT: 7.5 % (ref 3.0–12.0)
Monocytes Absolute: 0.7 10*3/uL (ref 0.1–1.0)
NEUTROS PCT: 74.1 % (ref 43.0–77.0)
Neutro Abs: 6.8 10*3/uL (ref 1.4–7.7)
Platelets: 316 10*3/uL (ref 150.0–400.0)
RBC: 4.55 Mil/uL (ref 3.87–5.11)
RDW: 14.8 % (ref 11.5–15.5)
WBC: 9.1 10*3/uL (ref 4.0–10.5)

## 2016-02-03 MED ORDER — GABAPENTIN 100 MG PO CAPS: 100.0000 mg | ORAL_CAPSULE | Freq: Every day | ORAL | 3 refills | Status: AC

## 2016-02-03 MED ORDER — NEOMYCIN-POLYMYXIN-HC 3.5-10000-1 OP SUSP
3.0000 [drp] | Freq: Four times a day (QID) | OPHTHALMIC | 0 refills | Status: AC
Start: 2016-02-03 — End: ?

## 2016-02-03 MED ORDER — TRAMADOL HCL 50 MG PO TABS: 50.0000 mg | ORAL_TABLET | Freq: Every evening | ORAL | 0 refills | Status: DC | PRN

## 2016-02-03 MED ORDER — AMLODIPINE BESYLATE 2.5 MG PO TABS: 2.5000 mg | ORAL_TABLET | Freq: Every day | ORAL | 5 refills | Status: AC

## 2016-02-03 NOTE — Progress Notes (Signed)
Subjective:    Patient ID: Karen [name], female    DOB: 10-13-1922, 82 y.o.   MRN: [id number]  HPI  82 year old patient who is seen today in follow-up.  She suffered a basal ganglia stroke in July of this year with left-sided hemiparesis.  She continues to have pain mainly involving the left leg and most marked in the left foot.  Pain is most aggravated at night and interferes with sleep.  She was seen 2 months ago and it was felt that this was more musculoligamentous.  She has been taken Tylenol only for pain She also complains of right knee and low back discomfort.  Past Medical History:  Diagnosis Date  . ANEMIA 10/21/2008  . AORTIC INSUFFICIENCY 11/26/2009  . BACK PAIN 06/27/2007  . COLON CANCER, PERSONAL HX 02/25/2010  . Diverticulosis of colon (without mention of hemorrhage) 08/14/2006  . GALLSTONES 10/09/2008  . GLAUCOMA NOS 08/14/2006  . H/O: stroke    behind right eye  . HYPERTENSION 08/14/2006  . HYPOKALEMIA, HX OF 11/26/2009  . MELENA, HX OF 10/12/2008  . NEOPLASM, MALIGNANT, COLON, CECUM 10/30/2008  . OCCLUSION, CENTRAL RETINAL ARTERY 08/14/2006  . PERSONAL HX COLONIC POLYPS 10/21/2008  . SYNCOPE, HX OF 08/14/2006  . URI 09/07/2008     Social History   Social History  . Marital status: Married    Spouse name: N/A  . Number of children: 3  . Years of education: N/A   Occupational History  . retired Retired   Social History Main Topics  . Smoking status: Never Smoker  . Smokeless tobacco: Never Used  . Alcohol use No  . Drug use: No  . Sexual activity: Not on file   Other Topics Concern  . Not on file   Social History Narrative  . No narrative on file    Past Surgical History:  Procedure Laterality Date  . ANKLE FRACTURE SURGERY  2011   right  . APPENDECTOMY    . CATARACT EXTRACTION     bilateral  . COLON SURGERY     11/20/2008,gallbladder removed  . DILATION AND CURETTAGE OF UTERUS    . TONSILLECTOMY      Family History  Problem Relation Age of Onset    . Heart attack Father   . Breast cancer Sister   . Lung cancer Brother   . Stroke Neg Hx   . Heart disease Neg Hx     Allergies  Allergen Reactions  . Morphine And Related Shortness Of Breath  . Sulfa Antibiotics Itching and Swelling  . Tape Other (See Comments)    PLEASE USE COBAN WRAP/PATIENT BRUISES EASILY AND HER SKIN IS THIN  . Amoxicillin Itching and Rash  . Ciprofloxacin Itching and Rash  . Methocarbamol Itching  . Trimpex [Trimethoprim] Itching and Rash    Current Outpatient Prescriptions on File Prior to Visit  Medication Sig Dispense Refill  . amLODipine (NORVASC) 5 MG tablet Take 1 tablet (5 mg total) by mouth daily. 90 tablet 3  . atenolol (TENORMIN) 50 MG tablet TAKE 1/2 TABLET BY MOUTH DAILY. 30 tablet 5  . bimatoprost (LUMIGAN) 0.01 % SOLN Place 1 drop into the right eye at bedtime.    . brimonidine (ALPHAGAN P) 0.1 % SOLN Place 1 drop into both eyes every 8 (eight) hours.     . Calcium Carb-Cholecalciferol (CALCIUM 600/VITAMIN D3) 600-800 MG-UNIT TABS Take 1 tablet by mouth daily.    . clopidogrel (PLAVIX) 75 MG tablet Take 1 tablet (75 mg  total) by mouth daily. 30 tablet 5  . dorzolamide-timolol (COSOPT) 22.3-6.8 MG/ML ophthalmic solution     . escitalopram (LEXAPRO) 10 MG tablet TAKE 1 TABLET (10 MG TOTAL) BY MOUTH DAILY. 90 tablet 1  . Menthol-Methyl Salicylate (MUSCLE RUB) 10-15 % CREA Apply 1 application topically as needed (for hip pain). 113 g 0  . tiZANidine (ZANAFLEX) 2 MG tablet Take 1 tablet (2 mg total) by mouth at bedtime. 30 tablet 5   No current facility-administered medications on file prior to visit.     BP 100/60 (BP Location: Right Arm, Patient Position: Sitting, Cuff Size: Normal)   Pulse 77   Temp 97.5 F (36.4 C) (Oral)   Resp 18   SpO2 98%    Review of Systems  HENT: Negative for congestion, dental problem, hearing loss, rhinorrhea, sinus pressure, sore throat and tinnitus.   Eyes: Negative for pain, discharge and visual  disturbance.  Respiratory: Negative for cough and shortness of breath.   Cardiovascular: Negative for chest pain, palpitations and leg swelling.  Gastrointestinal: Negative for abdominal distention, abdominal pain, blood in stool, constipation, diarrhea, nausea and vomiting.  Genitourinary: Negative for difficulty urinating, dysuria, flank pain, frequency, hematuria, pelvic pain, urgency, vaginal bleeding, vaginal discharge and vaginal pain.  Musculoskeletal: Positive for back pain. Negative for arthralgias, gait problem and joint swelling.  Skin: Negative for rash.  Neurological: Positive for weakness. Negative for dizziness, syncope, speech difficulty, numbness and headaches.  Hematological: Negative for adenopathy.  Psychiatric/Behavioral: Positive for sleep disturbance. Negative for agitation, behavioral problems and dysphoric mood. The patient is nervous/anxious.        Objective:   Physical Exam  Constitutional: She is oriented to person, place, and time. She appears well-developed and well-nourished.  Blood pressure low normal  HENT:  Head: Normocephalic.  Right Ear: External ear normal.  Left Ear: External ear normal.  Mouth/Throat: Oropharynx is clear and moist.  Eyes: Conjunctivae and EOM are normal. Pupils are equal, round, and reactive to light.  Neck: Normal range of motion. Neck supple. No thyromegaly present.  Cardiovascular: Normal rate, regular rhythm, normal heart sounds and intact distal pulses.   Pulmonary/Chest: Effort normal and breath sounds normal.  Abdominal: Soft. Bowel sounds are normal. She exhibits no mass. There is no tenderness.  Musculoskeletal: Normal range of motion.  The patient's left sock and shoe removed. The foot was slightly cool to touch but no local tenderness.  Pedal pulses were intact  Lymphadenopathy:    She has no cervical adenopathy.  Neurological: She is alert and oriented to person, place, and time.  Left hemiparesis arm affected  greater than leg, left arm in a sling  Skin: Skin is warm and dry. No rash noted.  Psychiatric: She has a normal mood and affect. Her behavior is normal.          Assessment & Plan:   Nocturnal left leg pain.  Will give a trial of tramadol and at bedtime, Neurontin for possible neuropathic pain History of essential hypertension.  Blood pressure remains low, will decrease amlodipine to 2.5 milligrams daily Status post the basal ganglia CVA with left hemiparesis Early conjunctivitis left eye.  Will observe for the next 24 hours and treat with Bleph-10 if worsens  Recheck 3 months  Nyoka Cowden

## 2016-02-03 NOTE — Patient Instructions (Signed)
Decrease amlodipine to 2.5 milligrams daily  Return in one month for follow-up

## 2016-02-04 DIAGNOSIS — I69352 Hemiplegia and hemiparesis following cerebral infarction affecting left dominant side: Secondary | ICD-10-CM | POA: Diagnosis not present

## 2016-02-04 DIAGNOSIS — F329 Major depressive disorder, single episode, unspecified: Secondary | ICD-10-CM | POA: Diagnosis not present

## 2016-02-04 DIAGNOSIS — I69322 Dysarthria following cerebral infarction: Secondary | ICD-10-CM | POA: Diagnosis not present

## 2016-02-04 DIAGNOSIS — I10 Essential (primary) hypertension: Secondary | ICD-10-CM | POA: Diagnosis not present

## 2016-02-04 DIAGNOSIS — I69391 Dysphagia following cerebral infarction: Secondary | ICD-10-CM | POA: Diagnosis not present

## 2016-02-04 DIAGNOSIS — R1310 Dysphagia, unspecified: Secondary | ICD-10-CM | POA: Diagnosis not present

## 2016-02-06 ENCOUNTER — Encounter (HOSPITAL_COMMUNITY): Payer: Self-pay | Admitting: Emergency Medicine

## 2016-02-06 ENCOUNTER — Emergency Department (HOSPITAL_COMMUNITY): Payer: Medicare Other

## 2016-02-06 ENCOUNTER — Inpatient Hospital Stay (HOSPITAL_COMMUNITY)
Admission: EM | Admit: 2016-02-06 | Discharge: 2016-02-10 | DRG: 194 | Disposition: A | Discharge status: Home Health | Payer: Medicare Other | Attending: Internal Medicine | Admitting: Internal Medicine

## 2016-02-06 DIAGNOSIS — H9190 Unspecified hearing loss, unspecified ear: Secondary | ICD-10-CM | POA: Diagnosis present

## 2016-02-06 DIAGNOSIS — Z7902 Long term (current) use of antithrombotics/antiplatelets: Secondary | ICD-10-CM | POA: Diagnosis not present

## 2016-02-06 DIAGNOSIS — E785 Hyperlipidemia, unspecified: Secondary | ICD-10-CM | POA: Diagnosis present

## 2016-02-06 DIAGNOSIS — I351 Nonrheumatic aortic (valve) insufficiency: Secondary | ICD-10-CM | POA: Diagnosis present

## 2016-02-06 DIAGNOSIS — Z85038 Personal history of other malignant neoplasm of large intestine: Secondary | ICD-10-CM | POA: Diagnosis not present

## 2016-02-06 DIAGNOSIS — Z888 Allergy status to other drugs, medicaments and biological substances status: Secondary | ICD-10-CM | POA: Diagnosis not present

## 2016-02-06 DIAGNOSIS — H409 Unspecified glaucoma: Secondary | ICD-10-CM | POA: Diagnosis present

## 2016-02-06 DIAGNOSIS — R404 Transient alteration of awareness: Secondary | ICD-10-CM | POA: Diagnosis not present

## 2016-02-06 DIAGNOSIS — E876 Hypokalemia: Secondary | ICD-10-CM | POA: Diagnosis present

## 2016-02-06 DIAGNOSIS — Z8249 Family history of ischemic heart disease and other diseases of the circulatory system: Secondary | ICD-10-CM

## 2016-02-06 DIAGNOSIS — Z86010 Personal history of colonic polyps: Secondary | ICD-10-CM

## 2016-02-06 DIAGNOSIS — J690 Pneumonitis due to inhalation of food and vomit: Secondary | ICD-10-CM | POA: Diagnosis not present

## 2016-02-06 DIAGNOSIS — I69391 Dysphagia following cerebral infarction: Secondary | ICD-10-CM | POA: Diagnosis not present

## 2016-02-06 DIAGNOSIS — R531 Weakness: Secondary | ICD-10-CM | POA: Diagnosis not present

## 2016-02-06 DIAGNOSIS — Z885 Allergy status to narcotic agent status: Secondary | ICD-10-CM | POA: Diagnosis not present

## 2016-02-06 DIAGNOSIS — H5440 Blindness, one eye, unspecified eye: Secondary | ICD-10-CM | POA: Diagnosis present

## 2016-02-06 DIAGNOSIS — R1312 Dysphagia, oropharyngeal phase: Secondary | ICD-10-CM | POA: Diagnosis present

## 2016-02-06 DIAGNOSIS — F32A Depression, unspecified: Secondary | ICD-10-CM | POA: Diagnosis present

## 2016-02-06 DIAGNOSIS — I69398 Other sequelae of cerebral infarction: Secondary | ICD-10-CM

## 2016-02-06 DIAGNOSIS — I10 Essential (primary) hypertension: Secondary | ICD-10-CM | POA: Diagnosis present

## 2016-02-06 DIAGNOSIS — J189 Pneumonia, unspecified organism: Secondary | ICD-10-CM | POA: Diagnosis not present

## 2016-02-06 DIAGNOSIS — I63549 Cerebral infarction due to unspecified occlusion or stenosis of unspecified cerebellar artery: Secondary | ICD-10-CM | POA: Diagnosis not present

## 2016-02-06 DIAGNOSIS — H548 Legal blindness, as defined in USA: Secondary | ICD-10-CM

## 2016-02-06 DIAGNOSIS — R0603 Acute respiratory distress: Secondary | ICD-10-CM

## 2016-02-06 DIAGNOSIS — F0631 Mood disorder due to known physiological condition with depressive features: Secondary | ICD-10-CM | POA: Diagnosis present

## 2016-02-06 DIAGNOSIS — I6350 Cerebral infarction due to unspecified occlusion or stenosis of unspecified cerebral artery: Secondary | ICD-10-CM | POA: Diagnosis not present

## 2016-02-06 DIAGNOSIS — Z993 Dependence on wheelchair: Secondary | ICD-10-CM

## 2016-02-06 DIAGNOSIS — R600 Localized edema: Secondary | ICD-10-CM | POA: Diagnosis present

## 2016-02-06 DIAGNOSIS — R627 Adult failure to thrive: Secondary | ICD-10-CM | POA: Diagnosis present

## 2016-02-06 DIAGNOSIS — Z882 Allergy status to sulfonamides status: Secondary | ICD-10-CM | POA: Diagnosis not present

## 2016-02-06 DIAGNOSIS — Z79899 Other long term (current) drug therapy: Secondary | ICD-10-CM | POA: Diagnosis not present

## 2016-02-06 DIAGNOSIS — I69354 Hemiplegia and hemiparesis following cerebral infarction affecting left non-dominant side: Secondary | ICD-10-CM

## 2016-02-06 DIAGNOSIS — Z803 Family history of malignant neoplasm of breast: Secondary | ICD-10-CM

## 2016-02-06 DIAGNOSIS — Z801 Family history of malignant neoplasm of trachea, bronchus and lung: Secondary | ICD-10-CM

## 2016-02-06 DIAGNOSIS — Z880 Allergy status to penicillin: Secondary | ICD-10-CM | POA: Diagnosis not present

## 2016-02-06 DIAGNOSIS — F329 Major depressive disorder, single episode, unspecified: Secondary | ICD-10-CM | POA: Diagnosis present

## 2016-02-06 DIAGNOSIS — R609 Edema, unspecified: Secondary | ICD-10-CM | POA: Diagnosis not present

## 2016-02-06 DIAGNOSIS — R05 Cough: Secondary | ICD-10-CM | POA: Diagnosis not present

## 2016-02-06 LAB — CBC WITH DIFFERENTIAL/PLATELET
BASOS ABS: 0 10*3/uL (ref 0.0–0.1)
Basophils Relative: 0 %
Eosinophils Absolute: 0 10*3/uL (ref 0.0–0.7)
Eosinophils Relative: 0 %
HEMATOCRIT: 34.5 % — AB (ref 36.0–46.0)
Hemoglobin: 11.8 g/dL — ABNORMAL LOW (ref 12.0–15.0)
LYMPHS ABS: 1.1 10*3/uL (ref 0.7–4.0)
Lymphocytes Relative: 6 %
MCH: 29.6 pg (ref 26.0–34.0)
MCHC: 34.2 g/dL (ref 30.0–36.0)
MCV: 86.5 fL (ref 78.0–100.0)
MONO ABS: 1.1 10*3/uL — AB (ref 0.1–1.0)
MONOS PCT: 6 %
Neutro Abs: 16.6 10*3/uL — ABNORMAL HIGH (ref 1.7–7.7)
Neutrophils Relative %: 88 %
PLATELETS: 274 10*3/uL (ref 150–400)
RBC: 3.99 MIL/uL (ref 3.87–5.11)
RDW: 14.3 % (ref 11.5–15.5)
WBC: 18.8 10*3/uL — AB (ref 4.0–10.5)

## 2016-02-06 LAB — COMPREHENSIVE METABOLIC PANEL
ALBUMIN: 3.5 g/dL (ref 3.5–5.0)
ALT: 31 U/L (ref 14–54)
AST: 48 U/L — AB (ref 15–41)
Alkaline Phosphatase: 88 U/L (ref 38–126)
Anion gap: 6 (ref 5–15)
BUN: 15 mg/dL (ref 6–20)
CHLORIDE: 105 mmol/L (ref 101–111)
CO2: 23 mmol/L (ref 22–32)
Calcium: 8.7 mg/dL — ABNORMAL LOW (ref 8.9–10.3)
Creatinine, Ser: 0.76 mg/dL (ref 0.44–1.00)
GFR calc Af Amer: >60 mL/min (ref >60)
GFR calc non Af Amer: >60 mL/min (ref >60)
GLUCOSE: 136 mg/dL — AB (ref 65–99)
POTASSIUM: 4.4 mmol/L (ref 3.5–5.1)
Sodium: 134 mmol/L — ABNORMAL LOW (ref 135–145)
Total Bilirubin: 0.6 mg/dL (ref 0.3–1.2)
Total Protein: 7.5 g/dL (ref 6.5–8.1)

## 2016-02-06 LAB — URINALYSIS, ROUTINE W REFLEX MICROSCOPIC
Bilirubin Urine: NEGATIVE
GLUCOSE, UA: NEGATIVE mg/dL
Hgb urine dipstick: NEGATIVE
Ketones, ur: NEGATIVE mg/dL
LEUKOCYTES UA: NEGATIVE
Nitrite: NEGATIVE
PH: 7 (ref 5.0–8.0)
PROTEIN: NEGATIVE mg/dL
SPECIFIC GRAVITY, URINE: 1.017 (ref 1.005–1.030)

## 2016-02-06 LAB — CBG MONITORING, ED: Glucose-Capillary: 133 mg/dL — ABNORMAL HIGH (ref 65–99)

## 2016-02-06 LAB — I-STAT TROPONIN, ED: Troponin i, poc: 0 ng/mL (ref 0.00–0.08)

## 2016-02-06 LAB — I-STAT CG4 LACTIC ACID, ED: Lactic Acid, Venous: 1.28 mmol/L (ref 0.5–1.9)

## 2016-02-06 LAB — LIPASE, BLOOD: Lipase: 29 U/L (ref 11–51)

## 2016-02-06 MED ORDER — ENSURE ENLIVE PO LIQD
237.0000 mL | Freq: Two times a day (BID) | ORAL | Status: DC
  Administered 2016-02-07 – 2016-02-10 (×6): 237 mL via ORAL

## 2016-02-06 MED ORDER — LATANOPROST 0.005 % OP SOLN
1.0000 [drp] | Freq: Every day | OPHTHALMIC | Status: DC
  Administered 2016-02-06 – 2016-02-09 (×4): 1 [drp] via OPHTHALMIC
  Filled 2016-02-06: qty 2.5

## 2016-02-06 MED ORDER — AMLODIPINE BESYLATE 5 MG PO TABS
2.5000 mg | ORAL_TABLET | Freq: Every day | ORAL | Status: DC
  Administered 2016-02-07 – 2016-02-10 (×4): 2.5 mg via ORAL
  Filled 2016-02-06 (×4): qty 1

## 2016-02-06 MED ORDER — SODIUM CHLORIDE 0.9 % IV SOLN
INTRAVENOUS | Status: DC
  Administered 2016-02-06 – 2016-02-09 (×6): via INTRAVENOUS

## 2016-02-06 MED ORDER — DEXTROSE 5 % IV SOLN
1.0000 g | Freq: Once | INTRAVENOUS | Status: AC
  Administered 2016-02-06: 1 g via INTRAVENOUS
  Filled 2016-02-06: qty 10

## 2016-02-06 MED ORDER — CLOPIDOGREL BISULFATE 75 MG PO TABS
75.0000 mg | ORAL_TABLET | Freq: Every day | ORAL | Status: DC
  Administered 2016-02-07 – 2016-02-10 (×4): 75 mg via ORAL
  Filled 2016-02-06 (×4): qty 1

## 2016-02-06 MED ORDER — ATENOLOL 25 MG PO TABS
25.0000 mg | ORAL_TABLET | Freq: Every day | ORAL | Status: DC
  Administered 2016-02-07 – 2016-02-10 (×4): 25 mg via ORAL
  Filled 2016-02-06 (×4): qty 1

## 2016-02-06 MED ORDER — DEXTROSE 5 % IV SOLN
1.0000 g | INTRAVENOUS | Status: DC
  Administered 2016-02-07 – 2016-02-10 (×4): 1 g via INTRAVENOUS
  Filled 2016-02-06 (×4): qty 10

## 2016-02-06 MED ORDER — ENOXAPARIN SODIUM 40 MG/0.4ML ~~LOC~~ SOLN
40.0000 mg | SUBCUTANEOUS | Status: DC
  Administered 2016-02-06 – 2016-02-09 (×4): 40 mg via SUBCUTANEOUS
  Filled 2016-02-06 (×4): qty 0.4

## 2016-02-06 MED ORDER — ACETAMINOPHEN 325 MG PO TABS: 650.0000 mg | ORAL_TABLET | Freq: Four times a day (QID) | ORAL | Status: DC | PRN

## 2016-02-06 MED ORDER — DORZOLAMIDE HCL-TIMOLOL MAL 2-0.5 % OP SOLN
1.0000 [drp] | Freq: Two times a day (BID) | OPHTHALMIC | Status: DC
  Administered 2016-02-06 – 2016-02-10 (×8): 1 [drp] via OPHTHALMIC
  Filled 2016-02-06: qty 10

## 2016-02-06 MED ORDER — GABAPENTIN 100 MG PO CAPS
100.0000 mg | ORAL_CAPSULE | Freq: Every day | ORAL | Status: DC
  Administered 2016-02-06 – 2016-02-09 (×4): 100 mg via ORAL
  Filled 2016-02-06 (×4): qty 1

## 2016-02-06 MED ORDER — DEXTROSE 5 % IV SOLN: 500.0000 mg | Freq: Once | INTRAVENOUS | Status: DC

## 2016-02-06 MED ORDER — MUSCLE RUB 10-15 % EX CREA
1.0000 "application " | TOPICAL_CREAM | CUTANEOUS | Status: DC | PRN
  Filled 2016-02-06: qty 85

## 2016-02-06 MED ORDER — TIZANIDINE HCL 4 MG PO TABS
2.0000 mg | ORAL_TABLET | Freq: Every day | ORAL | Status: DC
  Administered 2016-02-06 – 2016-02-09 (×4): 2 mg via ORAL
  Filled 2016-02-06 (×4): qty 1

## 2016-02-06 MED ORDER — TRAMADOL HCL 50 MG PO TABS
50.0000 mg | ORAL_TABLET | Freq: Four times a day (QID) | ORAL | Status: DC | PRN
  Administered 2016-02-08 (×2): 50 mg via ORAL
  Filled 2016-02-06 (×2): qty 1

## 2016-02-06 MED ORDER — ESCITALOPRAM OXALATE 10 MG PO TABS
10.0000 mg | ORAL_TABLET | Freq: Every day | ORAL | Status: DC
  Administered 2016-02-07 – 2016-02-10 (×4): 10 mg via ORAL
  Filled 2016-02-06 (×4): qty 1

## 2016-02-06 MED ORDER — BRIMONIDINE TARTRATE 0.15 % OP SOLN
1.0000 [drp] | Freq: Three times a day (TID) | OPHTHALMIC | Status: DC
  Administered 2016-02-06 – 2016-02-10 (×12): 1 [drp] via OPHTHALMIC
  Filled 2016-02-06: qty 5

## 2016-02-06 NOTE — ED Notes (Signed)
Hospitalist at bedside 

## 2016-02-06 NOTE — ED Notes (Signed)
Bed: [id number] Expected date: 02/06/16 Expected time: 12:17 PM Means of arrival: Ambulance Comments: 82 yo gen weakness

## 2016-02-06 NOTE — ED Notes (Signed)
MD at bedside. 

## 2016-02-06 NOTE — Progress Notes (Signed)
PHARMACY NOTE -  ANTIBIOTIC RENAL DOSE ADJUSTMENT    Request received for Pharmacy to assist with antibiotic renal dose adjustment.   Patient has been initiated on Rocephin/Zithromax for CAP.  Neither drug requires renal dose adjustment.  Current dosage is appropriate and need for further dosage adjustment appears unlikely at present.  Will sign off at this time.  Please reconsult if a change in clinical status warrants re-evaluation of dosage.  Reuel Boom, PharmD, BCPS Pager: [phone] 02/06/2016, 5:47 PM

## 2016-02-06 NOTE — ED Provider Notes (Addendum)
Medical screening examination/treatment/procedure(s) were conducted as a shared visit with non-physician practitioner(s) and myself.  I personally evaluated the patient during the encounter.   EKG Interpretation  Date/Time:  Sunday February 06 2016 12:53:40 EST Ventricular Rate:  73 PR Interval:    QRS Duration: 85 QT Interval:  397 QTC Calculation: 438 R Axis:   29 Text Interpretation:  Sinus rhythm Low voltage, precordial leads no acute changes  Confirmed by Layton Tappan MD, [place] ([id number]) on 02/06/2016 3:47:27 PM      82  year old female who presents with cough and generalized weakness. History of aortic insufficiency, hypertension, and prior CVA with residual left-sided weakness. Lives at home with her granddaughter. Family reports that she has been having 1 week of progressive generalized weakness. This had associating cough, occasional nausea and vomiting, and generalized myalgias and arthralgias. No known fevers, chest pain, or difficulty breathing. Does not normally ambulate but is able to transfer into a wheelchair, and family states that she was unable to do that today due to generalized weakness.  Patient afebrile and hemodynamically stable. On room air with normal work of breathing. Lung clear, but chest x-ray visualized and shows evidence of lingular pneumonia and she has leukocytosis of 18. She has a normal lactic acid. We'll treat for community-acquired pneumonia, and given her debilitation at home, we'll admit for ongoing management.   Forde Dandy, MD 02/06/16 [place] Kimberli Winne, MD 02/06/16 [phone]

## 2016-02-06 NOTE — ED Triage Notes (Addendum)
Per EMS. Pt complains of generalized body pain and weakness for the past week. Pain worse with movement. Recently given Rx for tramadol. Now having n/v also. Pt reports pain worst in R lower leg.

## 2016-02-06 NOTE — H&P (Signed)
[facility] History and Physical  Karen [name] [id number] DOB: 01/27/1923 DOA: 02/06/2016  Referring physician: ED PCP: Nyoka Cowden, MD  Specialists: none  Chief Complaint:  Weak   82 y/o ? R Basal ganglia CVA July 2017 with Persisting dense LUE deficit Htn HLD Dysphagia resulting from CVa stg II Colorectal t3n0  After CVA patient went to stay with relatives and developed a cough over the past week Note that patient's relatives have been sick with respiratory illnesses and sinusitis Patient at baseline and needs assistance with all ADLs and IADLs and moves around with a wheelchair but became so weak with this cough and illness that they weren't able to hold her up and paramedics were giving way  She also wears depends  At baseline she remembers where she has to some extent but occasionally talks out of turn and missed names one thing versus the other and it is not clear whether this is post infarct dementia or secondary to very poor hearing and very poor vision as 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 ANEMIA 10/21/2008  . AORTIC INSUFFICIENCY 11/26/2009  . BACK PAIN 06/27/2007  . COLON CANCER, PERSONAL HX 02/25/2010  . Diverticulosis of colon (without mention of hemorrhage) 08/14/2006  . GALLSTONES 10/09/2008  . GLAUCOMA NOS 08/14/2006  . H/O: stroke    behind right eye  . HYPERTENSION 08/14/2006  . HYPOKALEMIA, HX OF 11/26/2009  . MELENA, HX OF 10/12/2008  . NEOPLASM, MALIGNANT, COLON, CECUM 10/30/2008  . OCCLUSION, CENTRAL RETINAL ARTERY 08/14/2006  . PERSONAL HX COLONIC POLYPS 10/21/2008  . SYNCOPE, HX OF 08/14/2006  . URI 09/07/2008   Past Surgical History:  Procedure Laterality Date  . ANKLE FRACTURE SURGERY   2011   right  . APPENDECTOMY    . CATARACT EXTRACTION     bilateral  . COLON SURGERY     11/20/2008,gallbladder removed  . DILATION AND CURETTAGE OF UTERUS    . TONSILLECTOMY     Social History:  Social History   Social History Narrative  . No narrative on file    Allergies  Allergen Reactions  . Morphine And Related Shortness Of Breath  . Sulfa Antibiotics Itching and Swelling  . Tape Other (See Comments)    PLEASE USE COBAN WRAP/PATIENT BRUISES EASILY AND HER SKIN IS THIN  . Amoxicillin Itching and Rash  . Ciprofloxacin Itching and Rash  . Methocarbamol Itching  . Trimpex [Trimethoprim] Itching and Rash    Family History  Problem Relation Age of Onset  . Heart attack Father   . Breast cancer Sister   . Lung cancer Brother   . Stroke Neg Hx   . Heart disease Neg Hx     Prior to Admission medications   Medication Sig Start Date End Date Taking? Authorizing Provider  acetaminophen (TYLENOL) 325 MG tablet Take 650 mg by mouth every 6 (six) hours as needed for moderate pain.   Yes Historical Provider, MD  amLODipine (NORVASC) 2.5 MG tablet Take 1 tablet (2.5 mg total) by mouth daily. 02/03/16  Yes Marletta Lor, MD  atenolol (TENORMIN) 50 MG tablet TAKE 1/2 TABLET BY MOUTH DAILY. 12/15/15  Yes Marletta Lor, MD  bimatoprost (LUMIGAN) 0.01 %  SOLN Place 1 drop into the right eye at bedtime.   Yes Historical Provider, MD  brimonidine (ALPHAGAN P) 0.1 % SOLN Place 1 drop into both eyes every 8 (eight) hours.    Yes Historical Provider, MD  cholecalciferol (VITAMIN D) 1000 units tablet Take 1,000 Units by mouth daily.   Yes Historical Provider, MD  clopidogrel (PLAVIX) 75 MG tablet Take 1 tablet (75 mg total) by mouth daily. 11/24/15  Yes Marletta Lor, MD  dorzolamide-timolol (COSOPT) 22.3-6.8 MG/ML ophthalmic solution Place 1 drop into both eyes 2 (two) times daily.  12/08/15  Yes Historical Provider, MD  escitalopram (LEXAPRO) 10 MG tablet TAKE 1 TABLET (10 MG  TOTAL) BY MOUTH DAILY. 08/09/15  Yes Marletta Lor, MD  gabapentin (NEURONTIN) 100 MG capsule Take 1 capsule (100 mg total) by mouth at bedtime. 02/03/16  Yes Marletta Lor, MD  Menthol-Methyl Salicylate (MUSCLE RUB) 10-15 % CREA Apply 1 application topically as needed (for hip pain). Patient taking differently: Apply 1 application topically 3 (three) times daily as needed for muscle pain (for hip pain).  10/21/15  Yes Ivan Anchors Love, PA-C  tiZANidine (ZANAFLEX) 2 MG tablet Take 1 tablet (2 mg total) by mouth at bedtime. 11/24/15  Yes Marletta Lor, MD  traMADol (ULTRAM) 50 MG tablet Take 1 tablet (50 mg total) by mouth at bedtime as needed. Patient taking differently: Take 50 mg by mouth at bedtime as needed for severe pain.  02/03/16  Yes Marletta Lor, MD  vitamin C (ASCORBIC ACID) 500 MG tablet Take 500 mg by mouth daily.   Yes Historical Provider, MD  neomycin-polymyxin-hydrocortisone (CORTISPORIN) 3.5-10000-1 ophthalmic suspension Place 3 drops into the left eye 4 (four) times daily. Patient not taking: Reported on 02/06/2016 02/03/16   Marletta Lor, MD   Physical Exam: Vitals:   02/06/16 1301 02/06/16 1402 02/06/16 1500 02/06/16 1600  BP: 127/60 115/74 (!) 130/36 111/70  Pulse: 70 68 70 70  Resp: 19 16 20 19   Temp:      TempSrc:      SpO2: 94% 96% 93% 93%    Alert very hard of hearing somewhat oriented can tell me that she is at [place] contaminated the month and can tell me the season correctly EOMI left eye has what appear to be postoperative changes Very good dentition No JVD No bruit S1-S2 no murmur rub or gallop Abdomen is soft nontender nondistended no rebound no guarding Skin has no focal deficit or skin tears that I can visualize  Labs on Admission:  Basic Metabolic Panel:  Recent Labs Lab 02/03/16 1404 02/06/16 1257  NA 141 134*  K 3.7 4.4  CL 106 105  CO2 27 23  GLUCOSE 159* 136*  BUN 13 15  CREATININE 0.65 0.76  CALCIUM 9.4  8.7*   Liver Function Tests:  Recent Labs Lab 02/03/16 1404 02/06/16 1257  AST 37 48*  ALT 24 31  ALKPHOS 76 88  BILITOT 0.4 0.6  PROT 7.0 7.5  ALBUMIN 3.7 3.5    Recent Labs Lab 02/06/16 1257  LIPASE 29   No results for input(s): AMMONIA in the last 168 hours. CBC:  Recent Labs Lab 02/03/16 1404 02/06/16 1257  WBC 9.1 18.8*  NEUTROABS 6.8 16.6*  HGB 13.3 11.8*  HCT 40.0 34.5*  MCV 87.9 86.5  PLT 316.0 274   Cardiac Enzymes: No results for input(s): CKTOTAL, CKMB, CKMBINDEX, TROPONINI in the last 168 hours.  BNP (last 3 results) No results  for input(s): BNP in the last 8760 hours.  ProBNP (last 3 results) No results for input(s): PROBNP in the last 8760 hours.  CBG:  Recent Labs Lab 02/06/16 1309  GLUCAP 133*    Radiological Exams on Admission: Dg Chest 2 View  Result Date: 02/06/2016 CLINICAL DATA:  Generalized body pain and weakness EXAM: CHEST  2 VIEW COMPARISON:  10/03/2015 FINDINGS: The heart size appears normal. There is no pleural effusion identified. No pulmonary edema. Airspace opacity within the lingular portion of the left lung is identified, suspicious for pneumonia. IMPRESSION: 1. Suspect lingular pneumonia. Followup PA and lateral chest X-ray is recommended in 3-4 weeks following trial of antibiotic therapy to ensure resolution and exclude underlying malignancy. Electronically Signed   By: Kerby Moors M.D.   On: 02/06/2016 13:37   Sinus rhythm PR interval 0 point 12 QRS axis is 25 no ST-T wave changes across precordial  EKG:  Assessment/Plan  Community-acquired versus aspiration pneumonia and Obtain speech therapy evaluation Continue empiric coverage with azithromycin and Rocephin and dependent on speech therapy may need to change this to Unasyn Get strep pneumo and legionella Place on IV saline 75 cc an hour Get an a.m.  Prior CVA Continue Plavix 75 daily Control risk modifiers pulse factors 4 stroke related depression  continue Lexapro 10 daily  Hypertension continue amlodipine 2.5 daily, Tenormin 25 mg daily  Continue Cosopt for glaucoma, Lumigan 0.01  Muscle rigidity-Continue tizanidine 2 mg for spasm and rigidity as well as Aspercreme  Previously was limited ncode Do not attempt at intubation Telemetry Inpatient Discussed with family   Nita Sells [facility] Pager [phone]  If 7PM-7AM, please contact night-coverage [email or web address] Password [facility] 02/06/2016, 4:49 PM

## 2016-02-06 NOTE — ED Provider Notes (Signed)
[name] DEPT Provider Note   CSN: [id number] Arrival date & time: 02/06/16  1214     History   Chief Complaint Chief Complaint  Patient presents with  . Generalized Body Aches  . Fatigue    HPI Karen [name] is a 82 y.o. female.  HPI Karen [name] is a 82 y.o. 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 She states she lives at home with her granddaughter. She states this morning she was using the restroom and felt like she was unable to stand due to weakness. No fall. In review of systems she states she has had a cough and intermittent dysuria, nausea, and vomiting. No chest pain, shortness of breath, fever, abdominal pain, diarrhea, unilateral weakness. Nothing prior to arrival. Nothing makes her symptoms better or worse. She normally ambulates at home with a walker. She reports decreased by mouth intake.  Past Medical History:  Diagnosis Date  . ANEMIA 10/21/2008  . AORTIC INSUFFICIENCY 11/26/2009  . BACK PAIN 06/27/2007  . COLON CANCER, PERSONAL HX 02/25/2010  . Diverticulosis of colon (without mention of hemorrhage) 08/14/2006  . GALLSTONES 10/09/2008  . GLAUCOMA NOS 08/14/2006  . H/O: stroke    behind right eye  . HYPERTENSION 08/14/2006  . HYPOKALEMIA, HX OF 11/26/2009  . MELENA, HX OF 10/12/2008  . NEOPLASM, MALIGNANT, COLON, CECUM 10/30/2008  . OCCLUSION, CENTRAL RETINAL ARTERY 08/14/2006  . PERSONAL HX COLONIC POLYPS 10/21/2008  . SYNCOPE, HX OF 08/14/2006  . URI 09/07/2008    Patient Active Problem List   Diagnosis Date Noted  . CAP (community acquired pneumonia) 02/06/2016  . HOH (hard of hearing)   . Acute ischemic stroke ([place]) 10/04/2015  . Hyperlipidemia 10/04/2015  . Cerebrovascular accident (CVA) due to occlusion of cerebral artery ([place])   . Tachycardia   . Dysphagia,  post-stroke   . Stroke ([place]) 10/03/2015  . CVA (cerebral infarction) 10/03/2015  . Hypotension due to drugs   . HLD (hyperlipidemia)   . Legally blind   . Notalgia   . Fall 04/02/2015  . Depression 01/26/2015  . Essential hypertension   . Aortic valve disorder 11/26/2009  . NEOPLASM, MALIGNANT, COLON, CECUM 10/30/2008  . Anemia 10/21/2008  . PERSONAL HX COLONIC POLYPS 10/21/2008  . GALLSTONES 10/09/2008  . Backache 06/27/2007  . OCCLUSION, CENTRAL RETINAL ARTERY 08/14/2006  . Glaucoma 08/14/2006  . Diverticulosis of colon (without mention of hemorrhage) 08/14/2006  . SYNCOPE, HX OF 08/14/2006    Past Surgical History:  Procedure Laterality Date  . ANKLE FRACTURE SURGERY  2011   right  . APPENDECTOMY    . CATARACT EXTRACTION     bilateral  . COLON SURGERY     11/20/2008,gallbladder removed  . DILATION AND CURETTAGE OF UTERUS    . TONSILLECTOMY      OB History    No data available       Home Medications    Prior to Admission medications   Medication Sig Start Date End Date Taking? Authorizing Provider  acetaminophen (TYLENOL) 325 MG tablet Take 650 mg by mouth every 6 (six) hours as needed for moderate pain.   Yes Historical Provider, MD  amLODipine (NORVASC) 2.5 MG tablet Take 1 tablet (2.5 mg total) by mouth daily. 02/03/16  Yes Marletta Lor, MD  atenolol (TENORMIN) 50 MG tablet TAKE 1/2 TABLET BY MOUTH DAILY. 12/15/15  Yes Marletta Lor, MD  bimatoprost (LUMIGAN) 0.01 % SOLN Place 1 drop into the right eye at bedtime.   Yes Historical Provider, MD  brimonidine (ALPHAGAN P) 0.1 % SOLN Place 1 drop into both eyes every 8 (eight) hours.    Yes Historical Provider, MD  cholecalciferol (VITAMIN D) 1000 units tablet Take 1,000 Units by mouth daily.   Yes Historical Provider, MD  clopidogrel (PLAVIX) 75 MG tablet Take 1 tablet (75 mg total) by mouth daily. 11/24/15  Yes Marletta Lor, MD  dorzolamide-timolol (COSOPT) 22.3-6.8 MG/ML ophthalmic solution  Place 1 drop into both eyes 2 (two) times daily.  12/08/15  Yes Historical Provider, MD  escitalopram (LEXAPRO) 10 MG tablet TAKE 1 TABLET (10 MG TOTAL) BY MOUTH DAILY. 08/09/15  Yes Marletta Lor, MD  gabapentin (NEURONTIN) 100 MG capsule Take 1 capsule (100 mg total) by mouth at bedtime. 02/03/16  Yes Marletta Lor, MD  Menthol-Methyl Salicylate (MUSCLE RUB) 10-15 % CREA Apply 1 application topically as needed (for hip pain). Patient taking differently: Apply 1 application topically 3 (three) times daily as needed for muscle pain (for hip pain).  10/21/15  Yes Ivan Anchors Love, PA-C  tiZANidine (ZANAFLEX) 2 MG tablet Take 1 tablet (2 mg total) by mouth at bedtime. 11/24/15  Yes Marletta Lor, MD  traMADol (ULTRAM) 50 MG tablet Take 1 tablet (50 mg total) by mouth at bedtime as needed. Patient taking differently: Take 50 mg by mouth at bedtime as needed for severe pain.  02/03/16  Yes Marletta Lor, MD  vitamin C (ASCORBIC ACID) 500 MG tablet Take 500 mg by mouth daily.   Yes Historical Provider, MD  neomycin-polymyxin-hydrocortisone (CORTISPORIN) 3.5-10000-1 ophthalmic suspension Place 3 drops into the left eye 4 (four) times daily. Patient not taking: Reported on 02/06/2016 02/03/16   Marletta Lor, MD    Family History Family History  Problem Relation Age of Onset  . Heart attack Father   . Breast cancer Sister   . Lung cancer Brother   . Stroke Neg Hx   . Heart disease Neg Hx     Social History Social History  Substance Use Topics  . Smoking status: Never Smoker  . Smokeless tobacco: Never Used  . Alcohol use No     Allergies   Morphine and related; Sulfa antibiotics; Tape; Amoxicillin; Ciprofloxacin; Methocarbamol; and Trimpex [trimethoprim]   Review of Systems Review of Systems All other systems negative unless otherwise stated in HPI   Physical Exam Updated Vital Signs BP 115/74   Pulse 68   Temp 98.2 F (36.8 C) (Oral)   Resp 16   SpO2 96%     Physical Exam  Constitutional: She is oriented to person, place, and time. She appears well-developed and well-nourished.  Non-toxic appearance. She does not have a sickly appearance. She does not appear ill.  HENT:  Head: Normocephalic and atraumatic.  Mouth/Throat: Oropharynx is clear and moist.  No facial droop.   Eyes: Conjunctivae are normal. Pupils are equal, round, and reactive to light.  Neck: Normal range of motion. Neck supple.  Cardiovascular: Normal rate and regular rhythm.   Pulmonary/Chest: Effort normal and breath sounds normal. No accessory muscle usage or stridor. No respiratory distress. She has no wheezes. She has no rhonchi. She has no rales.  Abdominal: Soft. Bowel sounds are normal. She exhibits no distension. There is no tenderness.  Musculoskeletal: Normal range of motion.  Lymphadenopathy:    She has no cervical adenopathy.  Neurological: She is alert and oriented to person, place, and time.  Cranial nerves grossly intact. No slurred speech. Slightly decreased strength on the left (chronic).  Normal strength in remaining extremities.  Normal sensation throughout.  Skin: Skin is warm and dry.  Psychiatric: She has a normal mood and affect. Her behavior is normal.     ED Treatments / Results  Labs (all labs ordered are listed, but only abnormal results are displayed) Labs Reviewed  URINALYSIS, ROUTINE W REFLEX MICROSCOPIC (NOT AT [facility]) - Abnormal; Notable for the following:       Result Value   APPearance CLOUDY (*)    All other components within normal limits  CBC WITH DIFFERENTIAL/PLATELET - Abnormal; Notable for the following:    WBC 18.8 (*)    Hemoglobin 11.8 (*)    HCT 34.5 (*)    Neutro Abs 16.6 (*)    Monocytes Absolute 1.1 (*)    All other components within normal limits  COMPREHENSIVE METABOLIC PANEL - Abnormal; Notable for the following:    Sodium 134 (*)    Glucose, Bld 136 (*)    Calcium 8.7 (*)    AST 48 (*)    All other components  within normal limits  CBG MONITORING, ED - Abnormal; Notable for the following:    Glucose-Capillary 133 (*)    All other components within normal limits  LIPASE, BLOOD  I-STAT TROPOININ, ED  I-STAT CG4 LACTIC ACID, ED  I-STAT CG4 LACTIC ACID, ED    EKG  EKG Interpretation  Date/Time:  Sunday February 06 2016 12:53:40 EST Ventricular Rate:  73 PR Interval:    QRS Duration: 85 QT Interval:  397 QTC Calculation: 438 R Axis:   29 Text Interpretation:  Sinus rhythm Low voltage, precordial leads no acute changes  Confirmed by LIU MD, DANA [phone]) on 02/06/2016 3:47:27 PM       Radiology Dg Chest 2 View  Result Date: 02/06/2016 CLINICAL DATA:  Generalized body pain and weakness EXAM: CHEST  2 VIEW COMPARISON:  10/03/2015 FINDINGS: The heart size appears normal. There is no pleural effusion identified. No pulmonary edema. Airspace opacity within the lingular portion of the left lung is identified, suspicious for pneumonia. IMPRESSION: 1. Suspect lingular pneumonia. Followup PA and lateral chest X-ray is recommended in 3-4 weeks following trial of antibiotic therapy to ensure resolution and exclude underlying malignancy. Electronically Signed   By: Kerby Moors M.D.   On: 02/06/2016 13:37    Procedures Procedures (including critical care time)  Medications Ordered in ED Medications  cefTRIAXone (ROCEPHIN) 1 g in dextrose 5 % 50 mL IVPB (not administered)  azithromycin (ZITHROMAX) 500 mg in dextrose 5 % 250 mL IVPB (not administered)     Initial Impression / Assessment and Plan / ED Course  I have reviewed the triage vital signs and the nursing notes.  Pertinent labs & imaging results that were available during my care of the patient were reviewed by me and considered in my medical decision making (see chart for details).  Clinical Course    82 year old female present with generalized weakness over 5-6 weeks. Currently lives at home with granddaughter. Vitals reassuring, she  is afebrile.  Residual left sided weakness.  No new focal neurological deficits.  Abdomen soft and benign without rebound, guarding, or rigidity.  Heart sounds normal.  Lungs clear.  Reported cough and intermittent dysuria with some nausea and vomiting.  Will obtain EKG, CXR, and labs.   EKG without ischemia.  CXR remarkable for lingular PNA.  Vitals reassuring, normal lactic. Leukocytosis, 18.8.  Otherwise, labs without acute abnormalities.  1 point on the CURB score, low risk.  Consider outpatient treatment.  Patient lives at home with family.  She is not ambulatory at baseline.  Patient started on IV Rocephin and Azithromycin and admitted to medicine.  Case has been discussed with and seen by Dr. Oleta Mouse who agrees with the above plan for admission.  Final Clinical Impressions(s) / ED Diagnoses   Final diagnoses:  Community acquired pneumonia of left lung, unspecified part of lung    New Prescriptions New Prescriptions   No medications on file     Gloriann Loan, PA-C 02/06/16 [place] Liu, MD 02/06/16 2127

## 2016-02-07 LAB — COMPREHENSIVE METABOLIC PANEL
ALT: 23 U/L (ref 14–54)
AST: 33 U/L (ref 15–41)
Albumin: 3.2 g/dL — ABNORMAL LOW (ref 3.5–5.0)
Alkaline Phosphatase: 70 U/L (ref 38–126)
Anion gap: 3 — ABNORMAL LOW (ref 5–15)
BUN: 14 mg/dL (ref 6–20)
CO2: 24 mmol/L (ref 22–32)
Calcium: 8.4 mg/dL — ABNORMAL LOW (ref 8.9–10.3)
Chloride: 108 mmol/L (ref 101–111)
Creatinine, Ser: 0.66 mg/dL (ref 0.44–1.00)
GFR calc Af Amer: >60 mL/min (ref >60)
GFR calc non Af Amer: >60 mL/min (ref >60)
Glucose, Bld: 104 mg/dL — ABNORMAL HIGH (ref 65–99)
Potassium: 3.4 mmol/L — ABNORMAL LOW (ref 3.5–5.1)
Sodium: 135 mmol/L (ref 135–145)
Total Bilirubin: 0.7 mg/dL (ref 0.3–1.2)
Total Protein: 6.8 g/dL (ref 6.5–8.1)

## 2016-02-07 LAB — CBC
HEMATOCRIT: 33.4 % — AB (ref 36.0–46.0)
HEMOGLOBIN: 11.2 g/dL — AB (ref 12.0–15.0)
MCH: 28.9 pg (ref 26.0–34.0)
MCHC: 33.5 g/dL (ref 30.0–36.0)
MCV: 86.1 fL (ref 78.0–100.0)
Platelets: 232 10*3/uL (ref 150–400)
RBC: 3.88 MIL/uL (ref 3.87–5.11)
RDW: 14.3 % (ref 11.5–15.5)
WBC: 11.5 10*3/uL — AB (ref 4.0–10.5)

## 2016-02-07 LAB — EXPECTORATED SPUTUM ASSESSMENT W GRAM STAIN, RFLX TO RESP C

## 2016-02-07 LAB — HIV ANTIBODY (ROUTINE TESTING W REFLEX): HIV Screen 4th Generation wRfx: NONREACTIVE

## 2016-02-07 LAB — STREP PNEUMONIAE URINARY ANTIGEN: Strep Pneumo Urinary Antigen: NEGATIVE

## 2016-02-07 MED ORDER — POTASSIUM CHLORIDE 20 MEQ/15ML (10%) PO SOLN
40.0000 meq | Freq: Once | ORAL | Status: AC
  Administered 2016-02-07: 40 meq via ORAL
  Filled 2016-02-07 (×2): qty 30

## 2016-02-07 MED ORDER — DEXTROSE 5 % IV SOLN
500.0000 mg | INTRAVENOUS | Status: DC
  Administered 2016-02-07 – 2016-02-10 (×4): 500 mg via INTRAVENOUS
  Filled 2016-02-07 (×4): qty 500

## 2016-02-07 NOTE — Progress Notes (Signed)
PROGRESS NOTE                                                                                                                                                                                                             Patient Demographics:    Karen [name], is a 82 y.o. female, DOB - 11/10/1922, [id number]  Admit date - 02/06/2016   Admitting Physician Nita Sells, MD  Outpatient Primary MD for the patient is Nyoka Cowden, MD  LOS - 1   Chief Complaint  Patient presents with  . Generalized Body Aches  . Fatigue       Brief Narrative   82 year old female who presents with cough and generalized weakness. History of aortic insufficiency, hypertension, and prior CVA with residual left-sided weakness. Lives at home with her granddaughter, workup significant for pneumonia.  Subjective:    Tally Joe today has, No headache, No chest pain, No abdominal pain - No Nausea,Reports cough, nonproductive   Assessment  & Plan :    Active Problems:   Depression   HLD (hyperlipidemia)   Cerebrovascular accident (CVA) due to occlusion of cerebral artery ([place])   CAP (community acquired pneumonia)   CAP - She denies any aspiration symptoms, but giving her stroke with dysphagia, will obtain SLP evaluation - Continue with IV Rocephin and azithromycin, Mucinex, nebs as needed, and Mucinex - Follow blood cultures and sputum culture  History of recent CVA - Continue Plavix   hypertension  - Continue with home medication  Patient is legally blind, history of glaucoma - Continue with home eyedrops  Hypokalemia - Repleted, recheck in a.m.    Code Status : Partial  Family Communication  : none at bedside  Disposition Plan  : pending PT  Consults  :  None  Procedures  : none  DVT Prophylaxis  :  Lovenox - SCDs   Lab Results  Component Value Date   PLT 232 02/07/2016    Antibiotics  :    Anti-infectives    Start      Dose/Rate Route Frequency Ordered Stop   02/07/16 1600  cefTRIAXone (ROCEPHIN) 1 g in dextrose 5 % 50 mL IVPB     1 g 100 mL/hr over 30 Minutes Intravenous Every 24 hours 02/06/16 1709     02/07/16 1000  azithromycin (ZITHROMAX) 500 mg in dextrose  5 % 250 mL IVPB     500 mg 250 mL/hr over 60 Minutes Intravenous Every 24 hours 02/07/16 0922     02/06/16 1545  cefTRIAXone (ROCEPHIN) 1 g in dextrose 5 % 50 mL IVPB     1 g 100 mL/hr over 30 Minutes Intravenous  Once 02/06/16 1543 02/06/16 1706   02/06/16 1545  azithromycin (ZITHROMAX) 500 mg in dextrose 5 % 250 mL IVPB  Status:  Discontinued     500 mg 250 mL/hr over 60 Minutes Intravenous  Once 02/06/16 1543 02/07/16 0921        Objective:   Vitals:   02/06/16 1710 02/06/16 2022 02/06/16 2030 02/07/16 0530  BP: (!) 162/55 (!) 143/58 (!) 142/62 (!) 177/76  Pulse: 65 67  65  Resp: 18 18  18   Temp: 98.5 F (36.9 C) 98.1 F (36.7 C)  98.8 F (37.1 C)  TempSrc: Oral Oral  Axillary  SpO2: 95% 97%  96%  Weight: 63.6 kg (140 lb 3.4 oz)     Height: 5\' 6"  (1.676 m)       Wt Readings from Last 3 Encounters:  02/06/16 63.6 kg (140 lb 3.4 oz)  12/14/15 66 kg (145 lb 9.6 oz)  11/25/15 66 kg (145 lb 9.6 oz)     Intake/Output Summary (Last 24 hours) at 02/07/16 1229 Last data filed at 02/07/16 0900  Gross per 24 hour  Intake           1161.5 ml  Output                0 ml  Net           1161.5 ml     Physical Exam  Awake Alert, Pleasant, appropriate [place].AT,PERRAL Supple Neck,No JVD,  Symmetrical Chest wall movement, Good air movement bilaterally, no wheezing RRR,No Gallops,Rubs or new Murmurs, No Parasternal Heave +ve B.Sounds, Abd Soft, No tenderness,  No rebound - guarding or rigidity. No Cyanosis, Clubbing or edema, No new Rash or bruise      Data Review:    CBC  Recent Labs Lab 02/03/16 1404 02/06/16 1257 02/07/16 0511  WBC 9.1 18.8* 11.5*  HGB 13.3 11.8* 11.2*  HCT 40.0 34.5* 33.4*  PLT 316.0 274 232  MCV  87.9 86.5 86.1  MCH  --  29.6 28.9  MCHC 33.1 34.2 33.5  RDW 14.8 14.3 14.3  LYMPHSABS 1.6 1.1  --   MONOABS 0.7 1.1*  --   EOSABS 0.1 0.0  --   BASOSABS 0.0 0.0  --     Chemistries   Recent Labs Lab 02/03/16 1404 02/06/16 1257 02/07/16 0511  NA 141 134* 135  K 3.7 4.4 3.4*  CL 106 105 108  CO2 27 23 24   GLUCOSE 159* 136* 104*  BUN 13 15 14   CREATININE 0.65 0.76 0.66  CALCIUM 9.4 8.7* 8.4*  AST 37 48* 33  ALT 24 31 23   ALKPHOS 76 88 70  BILITOT 0.4 0.6 0.7   ------------------------------------------------------------------------------------------------------------------ No results for input(s): CHOL, HDL, LDLCALC, TRIG, CHOLHDL, LDLDIRECT in the last 72 hours.  Lab Results  Component Value Date   HGBA1C 5.4 10/03/2015   ------------------------------------------------------------------------------------------------------------------ No results for input(s): TSH, T4TOTAL, T3FREE, THYROIDAB in the last 72 hours.  Invalid input(s): FREET3 ------------------------------------------------------------------------------------------------------------------ No results for input(s): VITAMINB12, FOLATE, FERRITIN, TIBC, IRON, RETICCTPCT in the last 72 hours.  Coagulation profile No results for input(s): INR, PROTIME in the last 168 hours.  No results for input(s): DDIMER in  the last 72 hours.  Cardiac Enzymes No results for input(s): CKMB, TROPONINI, MYOGLOBIN in the last 168 hours.  Invalid input(s): CK ------------------------------------------------------------------------------------------------------------------ No results found for: BNP  Inpatient Medications  Scheduled Meds: . amLODipine  2.5 mg Oral Daily  . atenolol  25 mg Oral Daily  . azithromycin  500 mg Intravenous Q24H  . brimonidine  1 drop Both Eyes Q8H  . cefTRIAXone (ROCEPHIN)  IV  1 g Intravenous Q24H  . clopidogrel  75 mg Oral Daily  . dorzolamide-timolol  1 drop Both Eyes BID  . enoxaparin  (LOVENOX) injection  40 mg Subcutaneous Q24H  . escitalopram  10 mg Oral Daily  . feeding supplement (ENSURE ENLIVE)  237 mL Oral BID BM  . gabapentin  100 mg Oral QHS  . latanoprost  1 drop Right Eye QHS  . tiZANidine  2 mg Oral QHS   Continuous Infusions: . sodium chloride 75 mL/hr at 02/07/16 0047   PRN Meds:.acetaminophen, MUSCLE RUB, traMADol  Micro Results Recent Results (from the past 240 hour(s))  Culture, sputum-assessment     Status: None   Collection Time: 02/07/16  6:14 AM  Result Value Ref Range Status   Specimen Description SPUTUM  Final   Special Requests NONE  Final   Sputum evaluation   Final    THIS SPECIMEN IS ACCEPTABLE. RESPIRATORY CULTURE REPORT TO FOLLOW.   Report Status 02/07/2016 FINAL  Final    Radiology Reports Dg Chest 2 View  Result Date: 02/06/2016 CLINICAL DATA:  Generalized body pain and weakness EXAM: CHEST  2 VIEW COMPARISON:  10/03/2015 FINDINGS: The heart size appears normal. There is no pleural effusion identified. No pulmonary edema. Airspace opacity within the lingular portion of the left lung is identified, suspicious for pneumonia. IMPRESSION: 1. Suspect lingular pneumonia. Followup PA and lateral chest X-ray is recommended in 3-4 weeks following trial of antibiotic therapy to ensure resolution and exclude underlying malignancy. Electronically Signed   By: Kerby Moors M.D.   On: 02/06/2016 13:37     Ahuva Poynor M.D on 02/07/2016 at 12:29 PM  Between 7am to 7pm - Pager - [phone]  After 7pm go to [email or web address] - password [facility]  [facility] -  Office  [phone]

## 2016-02-07 NOTE — Evaluation (Signed)
Physical Therapy Evaluation Patient Details Name: Karen [name] MRN: [id number] DOB: 1922-10-04 Today's Date: 02/07/2016   History of Present Illness  82 year old female who presents with cough and generalized weakness. PMH: aortic insufficiency, hypertension,  prior CVA with residual left-sided weakness. Lives at home with her granddaughter. Family reports that she has been having 1 week of progressive generalized weakness. This had associating cough, occasional nausea and vomiting, and generalized myalgias and arthralgias; Does not normally ambulate but is able to transfer into a wheelchair  Clinical Impression  Pt admitted with above diagnosis. Pt currently with functional limitations due to the deficits listed below (see PT Problem List). * Pt will benefit from skilled PT to increase their independence and safety with mobility to allow discharge to the venue listed below.   Will likely need SNF, no family present at time of eval    Follow Up Recommendations SNF;Supervision/Assistance - 24 hour    Equipment Recommendations  None recommended by PT    Recommendations for Other Services       Precautions / Restrictions Precautions Precautions: Fall Precaution Comments: LUE flaccid Restrictions Weight Bearing Restrictions: No      Mobility  Bed Mobility Overal bed mobility: Needs Assistance Bed Mobility: Rolling;Supine to Sit Rolling: Mod assist (to pt right)   Supine to sit: Mod assist        Transfers Overall transfer level: Needs assistance   Transfers: Sit to/from Stand;Stand Pivot Transfers Sit to Stand: Max assist Stand pivot transfers: Mod assist       General transfer comment: assist for anterior-superior translation and to maintain standing; cues for LE position  Ambulation/Gait             General Gait Details: NT  Stairs            Wheelchair Mobility    Modified Rankin (Stroke Patients Only)       Balance Overall balance  assessment: Needs assistance   Sitting balance-Leahy Scale: Poor   Postural control: Posterior lean;Right lateral lean Standing balance support: Single extremity supported Standing balance-Leahy Scale: Zero                               Pertinent Vitals/Pain Pain Assessment: 0-10 Pain Score: 6  Pain Location: L shoulder with PROM Pain Descriptors / Indicators: Tender;Sore Pain Intervention(s): Monitored during session;Repositioned;Limited activity within patient's tolerance;Relaxation    Home Living Family/patient expects to be discharged to:: Private residence Living Arrangements: Other relatives Available Help at Discharge: Family;Available 24 hours/day Type of Home: House Home Access: Ramped entrance (??)     Home Layout: One level Home Equipment: Shower seat;Cane - quad;Wheelchair - manual Additional Comments: Pt lives with daughter, granddaughter, twin great grandchildren.     Prior Function Level of Independence: Needs assistance   Gait / Transfers Assistance Needed: assist with bed to chair transfers, unable to ascertain level of assist from pt; pt states that when she left rehab she was able to amb with cane and assist  ADL's / Homemaking Assistance Needed: pt needed A getting in shower and with all ADL activity - overall min A per pt        Hand Dominance        Extremity/Trunk Assessment   Upper Extremity Assessment: Defer to OT evaluation       LUE Deficits / Details: no active movement noted   Lower Extremity Assessment: Overall WFL for tasks assessed;LLE deficits/detail  LLE Deficits / Details: appears at least 3 to 3+/5 for activities tested     Communication   Communication:  (requires incr time to express thoughts)  Cognition Arousal/Alertness: Awake/alert Behavior During Therapy: WFL for tasks assessed/performed Overall Cognitive Status: Within Functional Limits for tasks assessed Area of Impairment: Attention;Problem  solving   Current Attention Level: Sustained         Problem Solving: Slow processing;Requires verbal cues General Comments: requires occasional redirection    General Comments      Exercises     Assessment/Plan    PT Assessment Patient needs continued PT services  PT Problem List Decreased strength;Decreased activity tolerance;Decreased balance;Decreased mobility          PT Treatment Interventions DME instruction;Gait training;Functional mobility training;Therapeutic activities;Therapeutic exercise;Patient/family education    PT Goals (Current goals can be found in the Care Plan section)  Acute Rehab PT Goals Patient Stated Goal: did not state PT Goal Formulation: Patient unable to participate in goal setting Time For Goal Achievement: 02/14/16 Potential to Achieve Goals: Fair    Frequency Min 3X/week   Barriers to discharge        Co-evaluation PT/OT/SLP Co-Evaluation/Treatment: Yes Reason for Co-Treatment: For patient/therapist safety;Complexity of the patient's impairments (multi-system involvement) PT goals addressed during session: Mobility/safety with mobility OT goals addressed during session: ADL's and self-care       End of Session   Activity Tolerance: Patient tolerated treatment well Patient left: in chair;with call bell/phone within reach;with chair alarm set Nurse Communication: Mobility status         Time: [id number] PT Time Calculation (min) (ACUTE ONLY): 28 min   Charges:   PT Evaluation $PT Eval Moderate Complexity: 1 Procedure     PT G Codes:        Thera Basden March 04, 2016, 2:18 PM

## 2016-02-07 NOTE — Evaluation (Signed)
Occupational Therapy Evaluation Patient Details Name: Karen [name] MRN: [id number] DOB: 1922-06-22 Today's Date: 02/07/2016    History of Present Illness 82 year old female who presents with cough and generalized weakness. History of aortic insufficiency, hypertension, and prior CVA with residual left-sided weakness. Lives at home with her granddaughter. Family reports that she has been having 1 week of progressive generalized weakness. This had associating cough, occasional nausea and vomiting, and generalized myalgias and arthralgias. No known fevers, chest pain, or difficulty breathing. Does not normally ambulate but is able to transfer into a wheelchair, and family states that she was unable to do that today due to generalized weakness.   Clinical Impression   Pt admitted with cough and weakness. Pt currently with functional limitations due to the deficits listed below (see OT Problem List).  Pt will benefit from skilled OT to increase their safety and independence with ADL and functional mobility for ADL to facilitate discharge to venue listed below.      Follow Up Recommendations  SNF;Home health OT;Supervision/Assistance - 24 hour          Precautions / Restrictions Precautions Precautions: Fall Precaution Comments: LUE flaccid Restrictions Weight Bearing Restrictions: No      Mobility Bed Mobility Overal bed mobility: Needs Assistance Bed Mobility: Supine to Sit     Supine to sit: Mod assist        Transfers Overall transfer level: Needs assistance   Transfers: Sit to/from Stand;Stand Pivot Transfers Sit to Stand: Max assist Stand pivot transfers: Mod assist       General transfer comment: max to stand and mod to pivot.    Balance Overall balance assessment: Needs assistance   Sitting balance-Leahy Scale: Poor     Standing balance support: Single extremity supported Standing balance-Leahy Scale: Poor                              ADL  Overall ADL's : Needs assistance/impaired Eating/Feeding: Minimal assistance;Cueing for safety;Cueing for sequencing;Sitting   Grooming: Minimal assistance;Sitting;Cueing for safety;Cueing for sequencing   Upper Body Bathing: Moderate assistance;Cueing for safety;Cueing for sequencing;Cueing for compensatory techniques;Maximal assistance   Lower Body Bathing: Maximal assistance;Cueing for safety;Cueing for sequencing   Upper Body Dressing : Moderate assistance;Sitting;Cueing for safety;Cueing for sequencing;Maximal assistance   Lower Body Dressing: Maximal assistance;Sit to/from stand;Cueing for sequencing;Cueing for compensatory techniques;Cueing for safety                 General ADL Comments: pt will need significant A with ADL activity as sitting balance is poor as well as standing balance               Pertinent Vitals/Pain Pain Assessment: 0-10 Pain Score: 6  Pain Location: L shoulder with PROM Pain Descriptors / Indicators: Tender;Sore Pain Intervention(s): Monitored during session;Repositioned;Limited activity within patient's tolerance;Relaxation     Hand Dominance     Extremity/Trunk Assessment Upper Extremity Assessment Upper Extremity Assessment: LUE deficits/detail LUE Deficits / Details: no active movement noted LUE Coordination: decreased fine motor;decreased gross motor           Communication Communication Communication: No difficulties   Cognition Arousal/Alertness: Awake/alert Behavior During Therapy: WFL for tasks assessed/performed Overall Cognitive Status: Within Functional Limits for tasks assessed                                Home Living Family/patient  expects to be discharged to:: Private residence Living Arrangements: Other relatives Available Help at Discharge: Family;Available 24 hours/day Type of Home: House Home Access: Stairs to enter     Home Layout: One level     Bathroom Shower/Tub: Tub/shower  unit Shower/tub characteristics: Architectural technologist: Standard     Home Equipment: Marine scientist - quad   Additional Comments: Pt lives with daughter, granddaughter, twin great grandchildren.       Prior Functioning/Environment Level of Independence: Needs assistance    ADL's / Homemaking Assistance Needed: pt needed A getting in shower and with all ADL activity - overall min A per pt            OT Problem List: Decreased strength;Decreased activity tolerance;Impaired vision/perception;Decreased safety awareness;Impaired UE functional use   OT Treatment/Interventions:      OT Goals(Current goals can be found in the care plan section) Acute Rehab OT Goals Patient Stated Goal: did not state OT Goal Formulation: With patient Time For Goal Achievement: 02/21/16 Potential to Achieve Goals: Good  OT Frequency: Min 2X/week   Barriers to D/C:            Co-evaluation PT/OT/SLP Co-Evaluation/Treatment: Yes Reason for Co-Treatment: For patient/therapist safety;Complexity of the patient's impairments (multi-system involvement)   OT goals addressed during session: ADL's and self-care      End of Session Nurse Communication: Mobility status  Activity Tolerance: Patient tolerated treatment well Patient left: in chair;with call bell/phone within reach;with chair alarm set   Time: 1150-1218 OT Time Calculation (min): 28 min Charges:  OT General Charges $OT Visit: 1 Procedure OT Evaluation $OT Eval Moderate Complexity: 1 Procedure G-Codes:    Media Pizzini, Thereasa Parkin 2016-02-15, 1:20 PM

## 2016-02-08 ENCOUNTER — Inpatient Hospital Stay (HOSPITAL_COMMUNITY): Payer: Medicare Other

## 2016-02-08 LAB — BASIC METABOLIC PANEL
ANION GAP: 6 (ref 5–15)
BUN: 18 mg/dL (ref 6–20)
CALCIUM: 8.5 mg/dL — AB (ref 8.9–10.3)
CO2: 22 mmol/L (ref 22–32)
Chloride: 109 mmol/L (ref 101–111)
Creatinine, Ser: 0.49 mg/dL (ref 0.44–1.00)
GFR calc Af Amer: >60 mL/min (ref >60)
GLUCOSE: 109 mg/dL — AB (ref 65–99)
POTASSIUM: 3.5 mmol/L (ref 3.5–5.1)
SODIUM: 137 mmol/L (ref 135–145)

## 2016-02-08 LAB — CBC
HCT: 33 % — ABNORMAL LOW (ref 36.0–46.0)
Hemoglobin: 11.2 g/dL — ABNORMAL LOW (ref 12.0–15.0)
MCH: 29.3 pg (ref 26.0–34.0)
MCHC: 33.9 g/dL (ref 30.0–36.0)
MCV: 86.4 fL (ref 78.0–100.0)
PLATELETS: 240 10*3/uL (ref 150–400)
RBC: 3.82 MIL/uL — AB (ref 3.87–5.11)
RDW: 14.2 % (ref 11.5–15.5)
WBC: 10.2 10*3/uL (ref 4.0–10.5)

## 2016-02-08 LAB — LEGIONELLA PNEUMOPHILA SEROGP 1 UR AG: L. pneumophila Serogp 1 Ur Ag: NEGATIVE

## 2016-02-08 MED ORDER — POTASSIUM CHLORIDE 20 MEQ/15ML (10%) PO SOLN: 40.0000 meq | Freq: Every day | ORAL | Status: DC

## 2016-02-08 MED ORDER — POTASSIUM CHLORIDE 20 MEQ/15ML (10%) PO SOLN
40.0000 meq | Freq: Once | ORAL | Status: AC
  Administered 2016-02-08: 40 meq via ORAL
  Filled 2016-02-08: qty 30

## 2016-02-08 MED ORDER — IPRATROPIUM-ALBUTEROL 0.5-2.5 (3) MG/3ML IN SOLN: 3.0000 mL | Freq: Once | RESPIRATORY_TRACT | Status: DC

## 2016-02-08 NOTE — Progress Notes (Signed)
PROGRESS NOTE                                                                                                                                                                                                             Patient Demographics:    Karen [name], is a 82 y.o. female, DOB - [birth year], [id number]  Admit date - 02/06/2016   Admitting Physician Nita Sells, MD  Outpatient Primary MD for the patient is Nyoka Cowden, MD  LOS - 2   Chief Complaint  Patient presents with  . Generalized Body Aches  . Fatigue       Brief Narrative   82 year old female who presents with cough and generalized weakness. History of aortic insufficiency, hypertension, and prior CVA with residual left-sided weakness. Lives at home with her granddaughter, workup significant for pneumonia.   Subjective:    Karen [name] today has, No headache, No chest pain, No abdominal pain - No Nausea,Reports cough, nonproductive   Assessment  & Plan :    Active Problems:   Depression   HLD (hyperlipidemia)   Cerebrovascular accident (CVA) due to occlusion of cerebral artery (HCC)   CAP (community acquired pneumonia)   CAP - She denies any aspiration symptoms, but giving her stroke with dysphagia, will obtain SLP evaluation - Continue with IV Rocephin and azithromycin, Mucinex, nebs as needed, and Mucinex, Significantly improved, and to be discharged tomorrow on oral Vantin (Has penicillin and quinolone allergy) - Left cultures with no growth, sputum cultures and multiple organisms on Gram stain, continue to follow   History of recent CVA - Continue Plavix   hypertension  - Continue with home medication  Patient is legally blind, history of glaucoma - Continue with home eyedrops  Hypokalemia - Repleted,     Code Status : Partial  Family Communication  : Discussed with daughter via phone   Disposition Plan  : PT recommended SNF , but she  has good family support, daughter wants her to go home tomorrow, they have home care and PT at home.  Consults  :  None  Procedures  : none  DVT Prophylaxis  :  Lovenox - SCDs   Lab Results  Component Value Date   PLT 240 02/08/2016    Antibiotics  :    Anti-infectives    Start  Dose/Rate Route Frequency Ordered Stop   02/07/16 1600  cefTRIAXone (ROCEPHIN) 1 g in dextrose 5 % 50 mL IVPB     1 g 100 mL/hr over 30 Minutes Intravenous Every 24 hours 02/06/16 1709     02/07/16 1000  azithromycin (ZITHROMAX) 500 mg in dextrose 5 % 250 mL IVPB     500 mg 250 mL/hr over 60 Minutes Intravenous Every 24 hours 02/07/16 0922     02/06/16 1545  cefTRIAXone (ROCEPHIN) 1 g in dextrose 5 % 50 mL IVPB     1 g 100 mL/hr over 30 Minutes Intravenous  Once 02/06/16 1543 02/06/16 1706   02/06/16 1545  azithromycin (ZITHROMAX) 500 mg in dextrose 5 % 250 mL IVPB  Status:  Discontinued     500 mg 250 mL/hr over 60 Minutes Intravenous  Once 02/06/16 1543 02/07/16 0921        Objective:   Vitals:   02/07/16 0530 02/07/16 1522 02/07/16 2037 02/08/16 0547  BP: (!) 177/76 (!) 146/54 (!) 171/54 (!) 169/71  Pulse: 65 73 78 71  Resp: 18 18 18 18   Temp: 98.8 F (37.1 C) 97.8 F (36.6 C) 98.4 F (36.9 C) 97.8 F (36.6 C)  TempSrc: Axillary Oral Oral Oral  SpO2: 96% 96% 97% 96%  Weight:      Height:        Wt Readings from Last 3 Encounters:  02/06/16 63.6 kg (140 lb 3.4 oz)  12/14/15 66 kg (145 lb 9.6 oz)  11/25/15 66 kg (145 lb 9.6 oz)     Intake/Output Summary (Last 24 hours) at 02/08/16 1330 Last data filed at 02/08/16 1200  Gross per 24 hour  Intake          2423.75 ml  Output                0 ml  Net          2423.75 ml     Physical Exam  Awake Alert, Pleasant, appropriate [place].AT,PERRAL Supple Neck,No JVD,  Symmetrical Chest wall movement, Good air movement bilaterally, no wheezing RRR,No Gallops,Rubs or new Murmurs, No Parasternal Heave +ve B.Sounds, Abd Soft, No  tenderness,  No rebound - guarding or rigidity. No Cyanosis, Clubbing or edema, No new Rash or bruise      Data Review:    CBC  Recent Labs Lab 02/03/16 1404 02/06/16 1257 02/07/16 0511 02/08/16 0448  WBC 9.1 18.8* 11.5* 10.2  HGB 13.3 11.8* 11.2* 11.2*  HCT 40.0 34.5* 33.4* 33.0*  PLT 316.0 274 232 240  MCV 87.9 86.5 86.1 86.4  MCH  --  29.6 28.9 29.3  MCHC 33.1 34.2 33.5 33.9  RDW 14.8 14.3 14.3 14.2  LYMPHSABS 1.6 1.1  --   --   MONOABS 0.7 1.1*  --   --   EOSABS 0.1 0.0  --   --   BASOSABS 0.0 0.0  --   --     Chemistries   Recent Labs Lab 02/03/16 1404 02/06/16 1257 02/07/16 0511 02/08/16 0448  NA 141 134* 135 137  K 3.7 4.4 3.4* 3.5  CL 106 105 108 109  CO2 27 23 24 22   GLUCOSE 159* 136* 104* 109*  BUN 13 15 14 18   CREATININE 0.65 0.76 0.66 0.49  CALCIUM 9.4 8.7* 8.4* 8.5*  AST 37 48* 33  --   ALT 24 31 23   --   ALKPHOS 76 88 70  --   BILITOT 0.4 0.6 0.7  --    ------------------------------------------------------------------------------------------------------------------  No results for input(s): CHOL, HDL, LDLCALC, TRIG, CHOLHDL, LDLDIRECT in the last 72 hours.  Lab Results  Component Value Date   HGBA1C 5.4 10/03/2015   ------------------------------------------------------------------------------------------------------------------ No results for input(s): TSH, T4TOTAL, T3FREE, THYROIDAB in the last 72 hours.  Invalid input(s): FREET3 ------------------------------------------------------------------------------------------------------------------ No results for input(s): VITAMINB12, FOLATE, FERRITIN, TIBC, IRON, RETICCTPCT in the last 72 hours.  Coagulation profile No results for input(s): INR, PROTIME in the last 168 hours.  No results for input(s): DDIMER in the last 72 hours.  Cardiac Enzymes No results for input(s): CKMB, TROPONINI, MYOGLOBIN in the last 168 hours.  Invalid input(s):  CK ------------------------------------------------------------------------------------------------------------------ No results found for: BNP  Inpatient Medications  Scheduled Meds: . amLODipine  2.5 mg Oral Daily  . atenolol  25 mg Oral Daily  . azithromycin  500 mg Intravenous Q24H  . brimonidine  1 drop Both Eyes Q8H  . cefTRIAXone (ROCEPHIN)  IV  1 g Intravenous Q24H  . clopidogrel  75 mg Oral Daily  . dorzolamide-timolol  1 drop Both Eyes BID  . enoxaparin (LOVENOX) injection  40 mg Subcutaneous Q24H  . escitalopram  10 mg Oral Daily  . feeding supplement (ENSURE ENLIVE)  237 mL Oral BID BM  . gabapentin  100 mg Oral QHS  . latanoprost  1 drop Right Eye QHS  . tiZANidine  2 mg Oral QHS   Continuous Infusions: . sodium chloride 75 mL/hr at 02/08/16 0122   PRN Meds:.acetaminophen, MUSCLE RUB, traMADol  Micro Results Recent Results (from the past 240 hour(s))  Culture, sputum-assessment     Status: None   Collection Time: 02/07/16  6:14 AM  Result Value Ref Range Status   Specimen Description SPUTUM  Final   Special Requests NONE  Final   Sputum evaluation   Final    THIS SPECIMEN IS ACCEPTABLE. RESPIRATORY CULTURE REPORT TO FOLLOW.   Report Status 02/07/2016 FINAL  Final  Culture, respiratory (NON-Expectorated)     Status: None (Preliminary result)   Collection Time: 02/07/16  6:15 AM  Result Value Ref Range Status   Specimen Description SPUTUM  Final   Special Requests NONE  Final   Gram Stain   Final    ABUNDANT WBC PRESENT, PREDOMINANTLY PMN FEW SQUAMOUS EPITHELIAL CELLS PRESENT MODERATE GRAM NEGATIVE COCCOBACILLI FEW GRAM NEGATIVE RODS MODERATE GRAM POSITIVE COCCI IN CLUSTERS FEW GRAM POSITIVE COCCI IN PAIRS AND CHAINS FEW GRAM NEGATIVE COCCI RARE YEAST    Culture   Final    CULTURE REINCUBATED FOR BETTER GROWTH Performed at [facility]    Report Status PENDING  Incomplete    Radiology Reports Dg Chest 2 View  Result Date:  02/06/2016 CLINICAL DATA:  Generalized body pain and weakness EXAM: CHEST  2 VIEW COMPARISON:  10/03/2015 FINDINGS: The heart size appears normal. There is no pleural effusion identified. No pulmonary edema. Airspace opacity within the lingular portion of the left lung is identified, suspicious for pneumonia. IMPRESSION: 1. Suspect lingular pneumonia. Followup PA and lateral chest X-ray is recommended in 3-4 weeks following trial of antibiotic therapy to ensure resolution and exclude underlying malignancy. Electronically Signed   By: Kerby Moors M.D.   On: 02/06/2016 13:37     Patriece Archbold M.D on 02/08/2016 at 1:30 PM  Between 7am to 7pm - Pager - [phone]  After 7pm go to [email or web address] - password [facility]  [facility] -  Office  [phone]

## 2016-02-08 NOTE — Progress Notes (Signed)
Initial Nutrition Assessment  DOCUMENTATION CODES:   Not applicable  INTERVENTION:  Continue Ensure Enlive po BID, each supplement provides 350 kcal and 20 grams of protein.  NUTRITION DIAGNOSIS:   Inadequate oral intake related to lethargy/confusion as evidenced by percent weight loss, per patient/family report.  GOAL:   Patient will meet greater than or equal to 90% of their needs  MONITOR:   PO intake, Supplement acceptance, Labs, Weight trends, I & O's  REASON FOR ASSESSMENT:   Malnutrition Screening Tool    ASSESSMENT:   82 year old female who presents with cough and generalized weakness. History of aortic insufficiency, hypertension, and prior CVA with residual left-sided weakness. Lives at home with her granddaughter, workup significant for pneumonia.   -Per chart PT is recommending SNF but patient's daughter wants her to d/c home tomorrow as they have home care and PT at home - good support. -SLP unable to see patient today as she was sleeping.   Attempted to speak to patient at bedside. She was sleeping and did not wake to name call x 3. Also noted sign on door that patient is blind and hard of hearing. Noted in patient history she has a history of dysphagia from CVA. Patient currently on soft diet, per MST home diet is regular. No family in room. Discussed with RN. Patient requires assistance at meals but has been drinking both Ensure Enlive already ordered for her.   Per chart patient has lost 15 lbs (10% body weight) over 10 months, which is not significant for time frame.  Meal Completion: 50-75%  Medications reviewed and include: NS @ 75 ml/hr.  Labs reviewed: Potassium 3.4 yesterday - now WNL.  As patient is blind/HOH and was sleeping did not want to scare her by completing NFPE without her knowing. From observation noted mild-moderate wasting of temporalis muscle, but clavicles and interosseous muscles appear well-nourished.  Diet Order:  DIET SOFT Room  service appropriate? Yes; Fluid consistency: Thin  Skin:  Reviewed, no issues  Last BM:  02/08/2016  Height:   Ht Readings from Last 1 Encounters:  02/06/16 5\' 6"  (1.676 m)    Weight:   Wt Readings from Last 1 Encounters:  02/06/16 140 lb 3.4 oz (63.6 kg)    Ideal Body Weight:     BMI:  Body mass index is 22.63 kg/m.  Estimated Nutritional Needs:   Kcal:  1285-1400 (MSJ x 1.2-1.3)  Protein:  65-80 grams (1-1.2 grams/kg)  Fluid:  >/= 1.5 L/day (25 ml/kg)  EDUCATION NEEDS:   Education needs no appropriate at this time  Willey Blade, MS, RD, LDN Pager: [phone] After Hours Pager: [phone]

## 2016-02-08 NOTE — Evaluation (Signed)
SLP Cancellation Note  Patient Details Name: Karen [name] MRN: [id number] DOB: 03/03/1923   Cancelled treatment:       Reason Eval/Treat Not Completed: Fatigue/lethargy limiting ability to participate (pt sound asleep after her noon time meal, will continue efforts, advised RN and family)   Claudie Fisherman, [facility] SLP [phone]

## 2016-02-09 ENCOUNTER — Inpatient Hospital Stay (HOSPITAL_COMMUNITY): Payer: Medicare Other

## 2016-02-09 DIAGNOSIS — R627 Adult failure to thrive: Secondary | ICD-10-CM

## 2016-02-09 DIAGNOSIS — J690 Pneumonitis due to inhalation of food and vomit: Secondary | ICD-10-CM

## 2016-02-09 DIAGNOSIS — Z993 Dependence on wheelchair: Secondary | ICD-10-CM

## 2016-02-09 DIAGNOSIS — H548 Legal blindness, as defined in USA: Secondary | ICD-10-CM

## 2016-02-09 DIAGNOSIS — I6350 Cerebral infarction due to unspecified occlusion or stenosis of unspecified cerebral artery: Secondary | ICD-10-CM

## 2016-02-09 LAB — CULTURE, RESPIRATORY: CULTURE: NORMAL

## 2016-02-09 LAB — CULTURE, RESPIRATORY W GRAM STAIN

## 2016-02-09 MED ORDER — RESOURCE THICKENUP CLEAR PO POWD
ORAL | Status: DC | PRN
  Filled 2016-02-09 (×2): qty 125

## 2016-02-09 NOTE — Progress Notes (Signed)
MBS completed, full report to follow.  Pt with mild oropharyngeal dysphagia c/b sensorimotor deficits.  She did have trace silent aspiration of thin that mixes with secretions as barium spills into airway prior to swallow being triggered due to delay.  Cued cough helpful, however volitional cough caused reflexive coughing episode with production/expectoration of viscous yellow tinged secretions.  These coughing episodes contribute to pt's fatigue and dyspnea - SLP halted study until pt recovered to near baseline.       Pt also with anterior positioning of epiglottis which essentially obliterates valleculae space = resulting in near penetration of pudding.  Cues to "swallow hard" helpful with timing and strength of swallow.  Pt also with mild valleculae residuals of pudding without awareness.  Educated pt to cough/expectoration indication for maximal airway protection.   Recommend to modify diet for maximal airway protection with strict precautions.    Dys3/nectar Allow ice chips or tsps thin water between meals Cue pt to "swallow hard" Follow solids with liquids Cease po if pt coughing or dyspneic  Luanna Salk, [facility] SLP [phone]

## 2016-02-09 NOTE — Progress Notes (Addendum)
Modified Barium Swallow Progress Note  Patient Details  Name: Karen [name] MRN: [id number] Date of Birth: 02-27-1923  Today's Date: 02/09/2016  Modified Barium Swallow completed.  Full report located under Chart Review in the Imaging Section.  Brief recommendations include the following:  Clinical Impression  Pt with mild oropharyngeal dysphagia c/b sensorimotor deficits.  She did have trace silent aspiration of thin that mixes with secretions as barium spills into airway prior to swallow being triggered due to delay.  Cued cough helpful, however volitional cough caused reflexive coughing episode with production/expectoration of viscous yellow tinged secretions.  These coughing episodes contribute to pt's fatigue and dyspnea - SLP halted study until pt recovered to near baseline.  Pt also with anterior positioning of epiglottis which essentially obliterates valleculae space = resulting in near penetration of pudding.  Cues to "swallow hard" helpful with timing and strength of swallow.  Pt also with mild valleculae residuals of pudding without awareness.  Educated pt to cough/expectoration indication for maximal airway protection. Recommend to modify diet for maximal airway protection with strict precautions.  Dys3/nectar      Of note, various postures including chin tuck or head turn were not tested as pt reported pain in her neck with movement.    Swallow Evaluation Recommendations       SLP Diet Recommendations: Dysphagia 3 (Mech soft) solids;Nectar thick liquid;Ice chips PRN after oral care;Free water protocol after oral care  Dys3/nectar      Liquid Administration via: Cup;No straw   Medication Administration: Whole meds with puree   Supervision: Comment;Staff to assist with self feeding   Compensations: Slow rate;Small sips/bites;Follow solids with liquid;Other (Comment) (cue pt to "swallow hard", rest if dyspneic)   Postural Changes: Seated upright at 90 degrees;Remain  semi-upright after after feeds/meals (Comment)   Oral Care Recommendations: Oral care BID   Other Recommendations: Order thickener from pharmacy;Have oral suction available    Claudie Fisherman, [facility] SLP [phone]

## 2016-02-09 NOTE — Progress Notes (Signed)
Yellow ring cut from patient at bedside. Ring placed in specimen cup and left with patient at bedside. Will ensure daughters know ring was removed.

## 2016-02-09 NOTE — Progress Notes (Signed)
Occupational Therapy Treatment Patient Details Name: Karen [name] MRN: [id number] DOB: 1922/07/02 Today's Date: 02/09/2016    History of present illness 82 year old female who presents with cough and generalized weakness. PMH: aortic insufficiency, hypertension,  prior CVA with residual left-sided weakness. Lives at home with her granddaughter. Family reports that she has been having 1 week of progressive generalized weakness. This had associating cough, occasional nausea and vomiting, and generalized myalgias and arthralgias; Does not normally ambulate but is able to transfer into a wheelchair   OT comments  Pts LUE with increased edema.  OT performed retrograde massage as well as positioned LUE on 2 pillows.  Follow Up Recommendations  SNF    Equipment Recommendations  None recommended by OT    Recommendations for Other Services      Precautions / Restrictions Precautions Precautions: Fall Precaution Comments: LUE flaccid       Mobility Bed Mobility Overal bed mobility: Needs Assistance Bed Mobility: Rolling;Supine to Sit Rolling: Mod assist            Transfers            NA              ADL                                         General ADL Comments: pts LUE not propped up and had significant edema.  Pts riing on R hand very tight.  RN notified.  OT performed retrograde massage to decrease edema - still unable to remove ring.  RN aware                 Cognition   Behavior During Therapy: WFL for tasks assessed/performed Overall Cognitive Status: Within Functional Limits for tasks assessed                               General Comments      Pertinent Vitals/ Pain       Pain Assessment: Faces Pain Score: 4  Pain Location: Left arm with ROM Pain Intervention(s): Monitored during session         Frequency  Min 2X/week        Progress Toward Goals  OT Goals(current goals can now be found in the care  plan section)  Progress towards OT goals: Progressing toward goals     Plan Discharge plan remains appropriate       End of Session     Activity Tolerance Patient tolerated treatment well   Patient Left in chair   Nurse Communication Mobility status        Time: [id number] OT Time Calculation (min): 24 min  Charges: OT General Charges $OT Visit: 1 Procedure OT Treatments $Therapeutic Activity: 23-37 mins  Aviah Sorci, Thereasa Parkin 02/09/2016, 12:33 PM

## 2016-02-09 NOTE — Evaluation (Signed)
Clinical/Bedside Swallow Evaluation Patient Details  Name: Karen [name] MRN: [id number] Date of Birth: September 23, 1922  Today's Date: 02/09/2016 Time: SLP Start Time (ACUTE ONLY): 0940 SLP Stop Time (ACUTE ONLY): 1008 SLP Time Calculation (min) (ACUTE ONLY): 28 min  Past Medical History:  Past Medical History:  Diagnosis Date  . ANEMIA 10/21/2008  . AORTIC INSUFFICIENCY 11/26/2009  . BACK PAIN 06/27/2007  . COLON CANCER, PERSONAL HX 02/25/2010  . Diverticulosis of colon (without mention of hemorrhage) 08/14/2006  . GALLSTONES 10/09/2008  . GLAUCOMA NOS 08/14/2006  . H/O: stroke    behind right eye  . HYPERTENSION 08/14/2006  . HYPOKALEMIA, HX OF 11/26/2009  . MELENA, HX OF 10/12/2008  . NEOPLASM, MALIGNANT, COLON, CECUM 10/30/2008  . OCCLUSION, CENTRAL RETINAL ARTERY 08/14/2006  . PERSONAL HX COLONIC POLYPS 10/21/2008  . SYNCOPE, HX OF 08/14/2006  . URI 09/07/2008   Past Surgical History:  Past Surgical History:  Procedure Laterality Date  . ANKLE FRACTURE SURGERY  2011   right  . APPENDECTOMY    . CATARACT EXTRACTION     bilateral  . COLON SURGERY     11/20/2008,gallbladder removed  . DILATION AND CURETTAGE OF UTERUS    . TONSILLECTOMY     HPI:  82 yo female adm to [facility] with cough - weakness - fatigue.  Diagnosed with HCAP. PMH + for recent CVA 82 year old female who presents with cough and generalized weakness. History of aortic insufficiency, hypertension, and prior CVA with residual left-sided weakness. Lives at home with her granddaughter.  Swallow evaluation ordered.     Assessment / Plan / Recommendation Clinical Impression  Pt asleep upon SLP entrance to room but awoke to verbal stimulation.  Upon HOB elevation, pt proceeded to cough profusely and expectorate viscous green and yellowed tinged secretions over a period of 12 minutes.  SlP did not administer po boluses due to pt's difficulty managing secretions and fatigue from respiratory effort required.  RN notified and SlP used oral  suction to aid secretion clearance.    Pt denies overtly coughing with po consumption and admits to becoming nauseated at times prior to admission.  Given h/o CVA concerning for possible silent aspiration/dysphagia, ? vagal nerve deficit, weakness and pneumonia, will proceed with instrumental swallow evaluation.  Pt and RN informed and agreeable to plan.     Aspiration Risk  Moderate aspiration risk    Diet Recommendation     Medication Administration: Whole meds with puree Compensations: Slow rate;Small sips/bites Postural Changes: Remain upright for at least 30 minutes after po intake    Other  Recommendations Oral Care Recommendations: Oral care BID   Follow up Recommendations        Frequency and Duration     tbd       Prognosis Prognosis for Safe Diet Advancement: Fair Barriers to Reach Goals: Other (Comment) (weakness)      Swallow Study   General Date of Onset: 02/06/16 HPI: 82 yo female adm to [facility] with cough - weakness - fatigue.  Diagnosed with HCAP. PMH + for recent CVA 82 year old female who presents with cough and generalized weakness. History of aortic insufficiency, hypertension, and prior CVA with residual left-sided weakness. Lives at home with her granddaughter.  Swallow evaluation ordered.   Type of Study: Bedside Swallow Evaluation Diet Prior to this Study: Dysphagia 3 (soft);Thin liquids Temperature Spikes Noted: No Respiratory Status: Room air History of Recent Intubation: No Behavior/Cognition: Alert;Cooperative Oral Cavity Assessment: Dry Oral Care Completed by SLP: Yes Oral Cavity -  Dentition: Adequate natural dentition Vision:  (pt is legally blind) Self-Feeding Abilities: Needs assist Patient Positioning: Upright in bed Baseline Vocal Quality: Low vocal intensity Volitional Cough: Strong;Congested Volitional Swallow: Unable to elicit    Oral/Motor/Sensory Function Overall Oral Motor/Sensory Function: Mild impairment Velum:  (deviates slightly  to right upon phonation)   Ice Chips Ice chips: Not tested   Thin Liquid Thin Liquid: Not tested    Nectar Thick Nectar Thick Liquid: Not tested   Honey Thick Honey Thick Liquid: Not tested   Puree Puree: Not tested   Solid   GO   Solid: Not tested        Claudie Fisherman, [facility] SLP [phone]

## 2016-02-09 NOTE — Progress Notes (Signed)
PROGRESS NOTE                                                                                                                                                                                                             Patient Demographics:    Karen [name], is a 82 y.o. female, DOB - 04-Oct-1922, [id number]  Admit date - 02/06/2016   Admitting Physician Nita Sells, MD  Outpatient Primary MD for the patient is Nyoka Cowden, MD  LOS - 3   Chief Complaint  Patient presents with  . Generalized Body Aches  . Fatigue       Brief Narrative   82 year old female who presents with cough and generalized weakness. History of aortic insufficiency, hypertension, and prior CVA with residual left-sided weakness. Lives at home with her granddaughter, workup significant for pneumonia.   Subjective:    Karen [name] is hard of hearing, but able to communicate She reported coughing and being tired after coughing spells Report left arm edema , her ring is too tight, she has it removed by cutting it today She denies pain, no fever   Assessment  & Plan :    Active Problems:   Depression   HLD (hyperlipidemia)   Cerebrovascular accident (CVA) due to occlusion of cerebral artery ([place])   CAP (community acquired pneumonia)   CAP vs aspiration pna - She denies any aspiration symptoms, but giving her stroke with dysphagia, SLP evaluation recommended dysphagia diet - Continue with IV Rocephin and azithromycin, Mucinex, nebs as needed, and Mucinex, Significantly improved, and to be discharged on oral Vantin (Has penicillin and quinolone allergy) if continue to improve - blood cultures with no growth, sputum cultures consistent with normal respiratory flora   History of recent CVA - Continue Plavix   hypertension  - Continue with home medication   Hypokalemia - Repleted, check mag  Left arm edema, also some lower extremity edema, likely  dependent edema due to prior cva, will get venous doppler to r/o DVT Her recent echo in 09/2015 did show grade I diastolic dysfunction ,will d/c ivf  Patient is legally blind, history of glaucoma - Continue with home eyedrops  FTT,  baseline bed to wheelchair bound, family want patient to go home, will maximize home care    Code Status : Partial  Family Communication  : Discussed with  daughter Karen [name] via phone   Disposition Plan  : PT recommended SNF , but she has good family support, daughter wants her to go home, they have home care and PT at home. Patient at baseline bed to wheel chair bound, does not ambulate  Consults  :  None  Procedures  : modified barium swallow study on 11/15  DVT Prophylaxis  :  Lovenox - SCDs   Lab Results  Component Value Date   PLT 240 02/08/2016    Antibiotics  :    Anti-infectives    Start     Dose/Rate Route Frequency Ordered Stop   02/07/16 1600  cefTRIAXone (ROCEPHIN) 1 g in dextrose 5 % 50 mL IVPB     1 g 100 mL/hr over 30 Minutes Intravenous Every 24 hours 02/06/16 1709     02/07/16 1000  azithromycin (ZITHROMAX) 500 mg in dextrose 5 % 250 mL IVPB     500 mg 250 mL/hr over 60 Minutes Intravenous Every 24 hours 02/07/16 0922     02/06/16 1545  cefTRIAXone (ROCEPHIN) 1 g in dextrose 5 % 50 mL IVPB     1 g 100 mL/hr over 30 Minutes Intravenous  Once 02/06/16 1543 02/06/16 1706   02/06/16 1545  azithromycin (ZITHROMAX) 500 mg in dextrose 5 % 250 mL IVPB  Status:  Discontinued     500 mg 250 mL/hr over 60 Minutes Intravenous  Once 02/06/16 1543 02/07/16 0921        Objective:   Vitals:   02/08/16 1400 02/08/16 1603 02/08/16 2054 02/09/16 0626  BP: (!) 161/52  (!) 152/78 (!) 176/118  [name]: 65 83 78 78  Resp:   18 18  Temp: 97.9 F (36.6 C)  97.9 F (36.6 C) 97.5 F (36.4 C)  TempSrc: Oral  Oral Oral  SpO2: 98% 97% 97% 94%  Weight:      Height:        Wt Readings from Last 3 Encounters:  02/06/16 63.6 kg (140 lb 3.4 oz)    12/14/15 66 kg (145 lb 9.6 oz)  11/25/15 66 kg (145 lb 9.6 oz)     Intake/Output Summary (Last 24 hours) at 02/09/16 1120 Last data filed at 02/09/16 0800  Gross per 24 hour  Intake             2900 ml  Output                0 ml  Net             2900 ml     Physical Exam  Awake Alert, Pleasant, appropriate [place].AT,PERRAL Supple Neck,No JVD,  Symmetrical Chest wall movement, Good air movement bilaterally, no wheezing RRR,No Gallops,Rubs or new Murmurs, No Parasternal Heave +ve B.Sounds, Abd Soft, No tenderness,  No rebound - guarding or rigidity. No Cyanosis, Clubbing, No new Rash or bruise Left arm edema, trace bilateral lower extremity edema      Data Review:    CBC  Recent Labs Lab 02/03/16 1404 02/06/16 1257 02/07/16 0511 02/08/16 0448  WBC 9.1 18.8* 11.5* 10.2  HGB 13.3 11.8* 11.2* 11.2*  HCT 40.0 34.5* 33.4* 33.0*  PLT 316.0 274 232 240  MCV 87.9 86.5 86.1 86.4  MCH  --  29.6 28.9 29.3  MCHC 33.1 34.2 33.5 33.9  RDW 14.8 14.3 14.3 14.2  LYMPHSABS 1.6 1.1  --   --   MONOABS 0.7 1.1*  --   --   EOSABS 0.1 0.0  --   --  BASOSABS 0.0 0.0  --   --     Chemistries   Recent Labs Lab 02/03/16 1404 02/06/16 1257 02/07/16 0511 02/08/16 0448  NA 141 134* 135 137  K 3.7 4.4 3.4* 3.5  CL 106 105 108 109  CO2 27 23 24 22   GLUCOSE 159* 136* 104* 109*  BUN 13 15 14 18   CREATININE 0.65 0.76 0.66 0.49  CALCIUM 9.4 8.7* 8.4* 8.5*  AST 37 48* 33  --   ALT 24 31 23   --   ALKPHOS 76 88 70  --   BILITOT 0.4 0.6 0.7  --    ------------------------------------------------------------------------------------------------------------------ No results for input(s): CHOL, HDL, LDLCALC, TRIG, CHOLHDL, LDLDIRECT in the last 72 hours.  Lab Results  Component Value Date   HGBA1C 5.4 10/03/2015   ------------------------------------------------------------------------------------------------------------------ No results for input(s): TSH, T4TOTAL, T3FREE, THYROIDAB  in the last 72 hours.  Invalid input(s): FREET3 ------------------------------------------------------------------------------------------------------------------ No results for input(s): VITAMINB12, FOLATE, FERRITIN, TIBC, IRON, RETICCTPCT in the last 72 hours.  Coagulation profile No results for input(s): INR, PROTIME in the last 168 hours.  No results for input(s): DDIMER in the last 72 hours.  Cardiac Enzymes No results for input(s): CKMB, TROPONINI, MYOGLOBIN in the last 168 hours.  Invalid input(s): CK ------------------------------------------------------------------------------------------------------------------ No results found for: BNP  Inpatient Medications  Scheduled Meds: . amLODipine  2.5 mg Oral Daily  . atenolol  25 mg Oral Daily  . azithromycin  500 mg Intravenous Q24H  . brimonidine  1 drop Both Eyes Q8H  . cefTRIAXone (ROCEPHIN)  IV  1 g Intravenous Q24H  . clopidogrel  75 mg Oral Daily  . dorzolamide-timolol  1 drop Both Eyes BID  . enoxaparin (LOVENOX) injection  40 mg Subcutaneous Q24H  . escitalopram  10 mg Oral Daily  . feeding supplement (ENSURE ENLIVE)  237 mL Oral BID BM  . gabapentin  100 mg Oral QHS  . ipratropium-albuterol  3 mL Nebulization Once  . latanoprost  1 drop Right Eye QHS  . tiZANidine  2 mg Oral QHS   Continuous Infusions: . sodium chloride 75 mL/hr at 02/09/16 0700   PRN Meds:.acetaminophen, MUSCLE RUB, traMADol  Micro Results Recent Results (from the past 240 hour(s))  Culture, blood (routine x 2) Call MD if unable to obtain prior to antibiotics being given     Status: None (Preliminary result)   Collection Time: 02/06/16  5:56 PM  Result Value Ref Range Status   Specimen Description BLOOD LEFT HAND  Final   Special Requests IN PEDIATRIC BOTTLE [place]  Final   Culture   Final    NO GROWTH 1 DAY Performed at [facility]    Report Status PENDING  Incomplete  Culture, blood (routine x 2) Call MD if unable to obtain  prior to antibiotics being given     Status: None (Preliminary result)   Collection Time: 02/06/16  5:57 PM  Result Value Ref Range Status   Specimen Description BLOOD RIGHT ANTECUBITAL  Final   Special Requests IN PEDIATRIC BOTTLE [place]  Final   Culture   Final    NO GROWTH 1 DAY Performed at [facility]    Report Status PENDING  Incomplete  Culture, sputum-assessment     Status: None   Collection Time: 02/07/16  6:14 AM  Result Value Ref Range Status   Specimen Description SPUTUM  Final   Special Requests NONE  Final   Sputum evaluation   Final    THIS SPECIMEN IS  ACCEPTABLE. RESPIRATORY CULTURE REPORT TO FOLLOW.   Report Status 02/07/2016 FINAL  Final  Culture, respiratory (NON-Expectorated)     Status: None   Collection Time: 02/07/16  6:15 AM  Result Value Ref Range Status   Specimen Description SPUTUM  Final   Special Requests NONE  Final   Gram Stain   Final    ABUNDANT WBC PRESENT, PREDOMINANTLY PMN FEW SQUAMOUS EPITHELIAL CELLS PRESENT MODERATE GRAM NEGATIVE COCCOBACILLI FEW GRAM NEGATIVE RODS MODERATE GRAM POSITIVE COCCI IN CLUSTERS FEW GRAM POSITIVE COCCI IN PAIRS AND CHAINS FEW GRAM NEGATIVE COCCI RARE YEAST    Culture   Final    Consistent with normal respiratory flora. Performed at [facility]    Report Status 02/09/2016 FINAL  Final    Radiology Reports Dg Chest 2 View  Result Date: 02/06/2016 CLINICAL DATA:  Generalized body pain and weakness EXAM: CHEST  2 VIEW COMPARISON:  10/03/2015 FINDINGS: The heart size appears normal. There is no pleural effusion identified. No pulmonary edema. Airspace opacity within the lingular portion of the left lung is identified, suspicious for pneumonia. IMPRESSION: 1. Suspect lingular pneumonia. Followup PA and lateral chest X-ray is recommended in 3-4 weeks following trial of antibiotic therapy to ensure resolution and exclude underlying malignancy. Electronically Signed   By: Kerby Moors M.D.   On:  02/06/2016 13:37   Dg Chest Port 1 View  Result Date: 02/08/2016 CLINICAL DATA:  Respiratory distress and cough. History of aortic insufficiency. EXAM: PORTABLE CHEST 1 VIEW COMPARISON:  02/06/2016. FINDINGS: Cardiomegaly with calcified tortuous aorta, stable. Mild interstitial prominence redemonstrated. Lingular infiltrate, suspected on the prior film, is improved. No effusion or pneumothorax. Osteopenia. IMPRESSION: Improved aeration. Electronically Signed   By: Staci Righter M.D.   On: 02/08/2016 16:27     Kamala Kolton M.D PhD on 02/09/2016 at 11:20 AM  Between 7am to 7pm - Pager - [phone]  After 7pm go to [email or web address] - password [facility]  [facility] -  Office  [phone]

## 2016-02-09 NOTE — Progress Notes (Signed)
LCSWA explained reason for consult-SNF placement recommendation. LCSWA confirmed with patient daughter Reola Calkins, the patient will return home with home health. She report the patient has 24 hour care givers and hospital and other medical equipment to continue patient care at home.  Pt. Daughter does not expressed any other concerns at this time. No other need identified.   Kathrin Greathouse, Latanya Presser, MSW Clinical Social Worker 5E and Psychiatric Service Line [phone] 02/09/2016  2:47 PM

## 2016-02-10 ENCOUNTER — Inpatient Hospital Stay (HOSPITAL_COMMUNITY): Payer: Medicare Other

## 2016-02-10 DIAGNOSIS — E876 Hypokalemia: Secondary | ICD-10-CM

## 2016-02-10 DIAGNOSIS — R609 Edema, unspecified: Secondary | ICD-10-CM

## 2016-02-10 LAB — BASIC METABOLIC PANEL
ANION GAP: 8 (ref 5–15)
BUN: 12 mg/dL (ref 6–20)
CALCIUM: 8.7 mg/dL — AB (ref 8.9–10.3)
CO2: 24 mmol/L (ref 22–32)
Chloride: 102 mmol/L (ref 101–111)
Creatinine, Ser: 0.55 mg/dL (ref 0.44–1.00)
GFR calc Af Amer: >60 mL/min (ref >60)
GLUCOSE: 105 mg/dL — AB (ref 65–99)
Potassium: 3.5 mmol/L (ref 3.5–5.1)
Sodium: 134 mmol/L — ABNORMAL LOW (ref 135–145)

## 2016-02-10 LAB — MAGNESIUM: Magnesium: 2.1 mg/dL (ref 1.7–2.4)

## 2016-02-10 MED ORDER — ENSURE ENLIVE PO LIQD: 237.0000 mL | Freq: Two times a day (BID) | ORAL | 12 refills | Status: AC

## 2016-02-10 MED ORDER — POTASSIUM CHLORIDE CRYS ER 20 MEQ PO TBCR
40.0000 meq | EXTENDED_RELEASE_TABLET | Freq: Once | ORAL | Status: AC
  Administered 2016-02-10: 40 meq via ORAL
  Filled 2016-02-10: qty 2

## 2016-02-10 MED ORDER — RESOURCE THICKENUP CLEAR PO POWD: ORAL | 0 refills | Status: AC

## 2016-02-10 MED ORDER — CEFDINIR 300 MG PO CAPS: 300.0000 mg | ORAL_CAPSULE | Freq: Two times a day (BID) | ORAL | 0 refills | Status: AC

## 2016-02-10 NOTE — Discharge Summary (Signed)
Discharge Summary  Karen [name] [id number] DOB: Jan 10, 1923  PCP: Nyoka Cowden, MD  Admit date: 02/06/2016 Discharge date: 02/10/2016  Time spent: <61mins  Recommendations for Outpatient Follow-up:  1. F/u with PMD within a week  for hospital discharge follow up, repeat cbc/bmp at follow up 2. pmd to repeat cxr in 3-4 weeks  Discharge Diagnoses:  Active Hospital Problems   Diagnosis Date Noted  . CAP (community acquired pneumonia) 02/06/2016  . Cerebrovascular accident (CVA) due to occlusion of cerebral artery ([place])   . HLD (hyperlipidemia)   . Depression 01/26/2015    Resolved Hospital Problems   Diagnosis Date Noted Date Resolved  No resolved problems to display.    Discharge Condition: stable  Diet recommendation: aspiration precaution  SLP Diet Recommendations: Dysphagia 3 (Mech soft) solids;Nectar thick liquid;Ice chips PRN after oral care;Free water protocol after oral care Dys3/nectar    Liquid Administration via: Cup;No straw   Medication Administration: Whole meds with puree   Supervision: Comment;Staff to assist with self feeding   Compensations: Slow rate;Small sips/bites;Follow solids with liquid;Other (Comment) (cue pt to "swallow hard", rest if dyspneic)   Postural Changes: Seated upright at 90 degrees;Remain semi-upright after after feeds/meals (Comment)   Oral Care Recommendations: Oral care BID   Other Recommendations: Order thickener from pharmacy;Have oral suction available   Filed Weights   02/06/16 1710  Weight: 63.6 kg (140 lb 3.4 oz)    History of present illness: (per admitting MD Nita Sells) 82 y/o ? R Basal ganglia CVA July 2017 with Persisting dense LUE deficit Htn HLD Dysphagia resulting from CVa stg II Colorectal t3n0  After CVA patient went to stay with relatives and developed a cough over the past week Note that patient's relatives have been sick with respiratory illnesses and  sinusitis Patient at baseline and needs assistance with all ADLs and IADLs and moves around with a wheelchair but became so weak with this cough and illness that they weren't able to hold her up and paramedics were giving way  She also wears depends  At baseline she remembers where she has to some extent but occasionally talks out of turn and missed names one thing versus the other and it is not clear whether this is post infarct dementia or secondary to very poor hearing and very poor vision as she is blind on the left side  She was brought over to the emergency room and it was found that she had on evaluation a leukocytosis of 18,000 with normal other labs and her chest x-ray confirmed suspected lingular pneumonia on the left side  Hospital Course:  Active Problems:   Depression   HLD (hyperlipidemia)   Cerebrovascular accident (CVA) due to occlusion of cerebral artery ([place])   CAP (community acquired pneumonia)  CAP vs aspiration pna - She denies any aspiration symptoms, but giving her stroke with dysphagia, SLP evaluation recommended dysphagia diet - Continue with IV Rocephin and azithromycin, Mucinex, nebs as needed, and Mucinex, Significantly improved, she is discharged on oral defdinir for another two days to finish total of 7 day abx treatment (Has penicillin and quinolone allergy)  - blood cultures with no growth, sputum cultures consistent with normal respiratory flora   History of recent CVA - Continue Plavix   hypertension  - Continue with home medication   Hypokalemia - Repleted, check mag 2.1  Left arm edema, also some lower extremity edema, likely dependent edema due to prior cva, venous doppler no DVT Her recent echo in 09/2015  did show grade I diastolic dysfunction , ivf d/ed Edema much improved at discharge  Patient is legally blind, history of glaucoma - Continue with home eyedrops  FTT,  baseline bed to wheelchair bound, family want patient to go  home, will maximize home care    Code Status : she was partial code initially , prior to discharge daughter expressed wishes to change to DNR, she also requested two copies of portable yellow DNR form which are provided.  Family Communication  : Discussed with daughter Juliann Pulse via phone   Disposition Plan  : PT recommended SNF , but she has good family support, daughter wants her to go home, they have home care and PT at home. Patient at baseline bed to wheel chair bound, does not ambulate  Consults  :  speech therapy/PT/OT/case manager  Procedures  : modified barium swallow study on 11/15  DVT Prophylaxis  :  Lovenox - SCDs   Discharge Exam: BP (!) 156/64 (BP Location: Right Arm)   Pulse 64   Temp 98.7 F (37.1 C) (Oral)   Resp 18   Ht 5\' 6"  (1.676 m)   Wt 63.6 kg (140 lb 3.4 oz)   SpO2 95%   BMI 22.63 kg/m   General: * Cardiovascular: * Respiratory: *  Discharge Instructions You were cared for by a hospitalist during your hospital stay. If you have any questions about your discharge medications or the care you received while you were in the hospital after you are discharged, you can call the unit and asked to speak with the hospitalist on call if the hospitalist that took care of you is not available. Once you are discharged, your primary care physician will handle any further medical issues. Please note that NO REFILLS for any discharge medications will be authorized once you are discharged, as it is imperative that you return to your primary care physician (or establish a relationship with a primary care physician if you do not have one) for your aftercare needs so that they can reassess your need for medications and monitor your lab values.  Discharge Instructions    Discharge instructions    Complete by:  As directed    Aspiration precaution:  Diet instruction per speech recommendation:   SLP Diet Recommendations: Dysphagia 3 (Mech soft) solids;Nectar thick  liquid;Ice chips PRN after oral care;Free water protocol after oral care Dys3/nectar    Liquid Administration via: Cup;No straw   Medication Administration: Whole meds with puree   Supervision: Comment;Staff to assist with self feeding   Compensations: Slow rate;Small sips/bites;Follow solids with liquid;Other (Comment) (cue pt to "swallow hard", rest if dyspneic)   Postural Changes: Seated upright at 90 degrees;Remain semi-upright after after feeds/meals (Comment)   Oral Care Recommendations: Oral care BID   Other Recommendations: Order thickener from pharmacy;Have oral suction available   Face-to-face encounter (required for Medicare/Medicaid patients)    Complete by:  As directed    I Aamir Mclinden certify that this patient is under my care and that I, or a nurse practitioner or physician's assistant working with me, had a face-to-face encounter that meets the physician face-to-face encounter requirements with this patient on 02/10/2016. The encounter with the patient was in whole, or in part for the following medical condition(s) which is the primary reason for home health care (List medical condition): FTT   The encounter with the patient was in whole, or in part, for the following medical condition, which is the primary reason for home health care:  FTT   I certify that, based on my findings, the following services are medically necessary home health services:   Nursing Physical therapy Speech language pathology     Reason for Medically Necessary Home Health Services:  Skilled Nursing- Change/Decline in Patient Status   My clinical findings support the need for the above services:  Cognitive impairments, dementia, or mental confusion  that make it unsafe to leave home   Further, I certify that my clinical findings support that this patient is homebound due to:  Mental confusion   Home Health    Complete by:  As directed    To provide the following care/treatments:    PT OT SLP RN     Increase activity slowly    Complete by:  As directed        Medication List    TAKE these medications   acetaminophen 325 MG tablet Commonly known as:  TYLENOL Take 650 mg by mouth every 6 (six) hours as needed for moderate pain.   amLODipine 2.5 MG tablet Commonly known as:  NORVASC Take 1 tablet (2.5 mg total) by mouth daily.   atenolol 50 MG tablet Commonly known as:  TENORMIN TAKE 1/2 TABLET BY MOUTH DAILY.   bimatoprost 0.01 % Soln Commonly known as:  LUMIGAN Place 1 drop into the right eye at bedtime.   brimonidine 0.1 % Soln Commonly known as:  ALPHAGAN P Place 1 drop into both eyes every 8 (eight) hours.   cefdinir 300 MG capsule Commonly known as:  OMNICEF Take 1 capsule (300 mg total) by mouth 2 (two) times daily.   cholecalciferol 1000 units tablet Commonly known as:  VITAMIN D Take 1,000 Units by mouth daily.   clopidogrel 75 MG tablet Commonly known as:  PLAVIX Take 1 tablet (75 mg total) by mouth daily.   dorzolamide-timolol 22.3-6.8 MG/ML ophthalmic solution Commonly known as:  COSOPT Place 1 drop into both eyes 2 (two) times daily.   escitalopram 10 MG tablet Commonly known as:  LEXAPRO TAKE 1 TABLET (10 MG TOTAL) BY MOUTH DAILY.   feeding supplement (ENSURE ENLIVE) Liqd Take 237 mLs by mouth 2 (two) times daily between meals.   gabapentin 100 MG capsule Commonly known as:  NEURONTIN Take 1 capsule (100 mg total) by mouth at bedtime.   MUSCLE RUB 10-15 % Crea Apply 1 application topically as needed (for hip pain). What changed:  when to take this  reasons to take this   neomycin-polymyxin-hydrocortisone 3.5-10000-1 ophthalmic suspension Commonly known as:  CORTISPORIN Place 3 drops into the left eye 4 (four) times daily.   RESOURCE THICKENUP CLEAR Powd As need   tiZANidine 2 MG tablet Commonly known as:  ZANAFLEX Take 1 tablet (2 mg total) by mouth at bedtime.   traMADol 50 MG tablet Commonly known as:   ULTRAM Take 1 tablet (50 mg total) by mouth at bedtime as needed. What changed:  reasons to take this   vitamin C 500 MG tablet Commonly known as:  ASCORBIC ACID Take 500 mg by mouth daily.      Allergies  Allergen Reactions  . Morphine And Related Shortness Of Breath  . Sulfa Antibiotics Itching and Swelling  . Tape Other (See Comments)    PLEASE USE COBAN WRAP/PATIENT BRUISES EASILY AND HER SKIN IS THIN  . Amoxicillin Itching and Rash  . Ciprofloxacin Itching and Rash  . Methocarbamol Itching  . Trimpex [Trimethoprim] Itching and Rash   Follow-up Information    Nyoka Cowden,  MD Follow up in 1 week(s).   Specialty:  Internal Medicine Why:  hospital discharge follow up, pmd to schedule repeat cxr in 3-4weeks  Contact information: [place] [address] [phone]            The results of significant diagnostics from this hospitalization (including imaging, microbiology, ancillary and laboratory) are listed below for reference.    Significant Diagnostic Studies: Dg Chest 2 View  Result Date: 02/06/2016 CLINICAL DATA:  Generalized body pain and weakness EXAM: CHEST  2 VIEW COMPARISON:  10/03/2015 FINDINGS: The heart size appears normal. There is no pleural effusion identified. No pulmonary edema. Airspace opacity within the lingular portion of the left lung is identified, suspicious for pneumonia. IMPRESSION: 1. Suspect lingular pneumonia. Followup PA and lateral chest X-ray is recommended in 3-4 weeks following trial of antibiotic therapy to ensure resolution and exclude underlying malignancy. Electronically Signed   By: Kerby Moors M.D.   On: 02/06/2016 13:37   Dg Chest Port 1 View  Result Date: 02/08/2016 CLINICAL DATA:  Respiratory distress and cough. History of aortic insufficiency. EXAM: PORTABLE CHEST 1 VIEW COMPARISON:  02/06/2016. FINDINGS: Cardiomegaly with calcified tortuous aorta, stable. Mild interstitial prominence  redemonstrated. Lingular infiltrate, suspected on the prior film, is improved. No effusion or pneumothorax. Osteopenia. IMPRESSION: Improved aeration. Electronically Signed   By: Staci Righter M.D.   On: 02/08/2016 16:27   Dg Swallowing Func-speech Pathology  Result Date: 02/09/2016 Objective Swallowing Evaluation: Type of Study: MBS-Modified Barium Swallow Study Patient Details Name: Karen [name] MRN: [id number] Date of Birth: Apr 19, 1922 Today's Date: 02/09/2016 Time: SLP Start Time (ACUTE ONLY): 1232-SLP Stop Time (ACUTE ONLY): 1300 SLP Time Calculation (min) (ACUTE ONLY): 28 min Past Medical History: Past Medical History: Diagnosis Date . ANEMIA 10/21/2008 . AORTIC INSUFFICIENCY 11/26/2009 . BACK PAIN 06/27/2007 . COLON CANCER, PERSONAL HX 02/25/2010 . Diverticulosis of colon (without mention of hemorrhage) 08/14/2006 . GALLSTONES 10/09/2008 . GLAUCOMA NOS 08/14/2006 . H/O: stroke   behind right eye . HYPERTENSION 08/14/2006 . HYPOKALEMIA, HX OF 11/26/2009 . MELENA, HX OF 10/12/2008 . NEOPLASM, MALIGNANT, COLON, CECUM 10/30/2008 . OCCLUSION, CENTRAL RETINAL ARTERY 08/14/2006 . PERSONAL HX COLONIC POLYPS 10/21/2008 . SYNCOPE, HX OF 08/14/2006 . URI 09/07/2008 Past Surgical History: Past Surgical History: Procedure Laterality Date . ANKLE FRACTURE SURGERY  2011  right . APPENDECTOMY   . CATARACT EXTRACTION    bilateral . COLON SURGERY    11/20/2008,gallbladder removed . DILATION AND CURETTAGE OF UTERUS   . TONSILLECTOMY   HPI: 82 yo female adm to [facility] with cough - weakness - fatigue.  Diagnosed with HCAP. PMH + for recent CVA 82 year old female who presents with cough and generalized weakness. History of aortic insufficiency, hypertension, and prior CVA with residual left-sided weakness. Lives at home with her granddaughter.  Swallow evaluation ordered.   Subjective: pt awake in chair Assessment / Plan / Recommendation CHL IP CLINICAL IMPRESSIONS 02/09/2016 Therapy Diagnosis Mild oropharyngeal dysphagia  Clinical Impression Pt  with mild oropharyngeal dysphagia c/b sensorimotor deficits.  She did have trace silent aspiration of thin that mixes with secretions as barium spills into airway prior to swallow being triggered due to delay.  Cued cough helpful, however volitional cough caused reflexive coughing episode with production/expectoration of viscous yellow tinged secretions.  These coughing episodes contribute to pt's fatigue and dyspnea - SLP halted study until pt recovered to near baseline.  Pt also with anterior positioning of epiglottis which essentially obliterates valleculae space = resulting in near  penetration of pudding.  Cues to "swallow hard" helpful with timing and strength of swallow.  Pt also with mild valleculae residuals of pudding without awareness.  Educated pt to cough/expectoration indication for maximal airway protection. Recommend to modify diet for maximal airway protection with strict precautions.  Dys3/nectar    Impact on safety and function Moderate aspiration risk   CHL IP TREATMENT RECOMMENDATION 02/09/2016 Treatment Recommendations Therapy as outlined in treatment plan below   Prognosis 02/09/2016 Prognosis for Safe Diet Advancement Fair Barriers to Reach Goals Other (Comment) Barriers/Prognosis Comment -- CHL IP DIET RECOMMENDATION 02/09/2016 SLP Diet Recommendations -- Liquid Administration via Cup;No straw Medication Administration Whole meds with puree Compensations Slow rate;Small sips/bites;Follow solids with liquid;Other (Comment) Postural Changes Seated upright at 90 degrees;Remain semi-upright after after feeds/meals (Comment)   CHL IP OTHER RECOMMENDATIONS 02/09/2016 Recommended Consults -- Oral Care Recommendations Oral care BID Other Recommendations Order thickener from pharmacy;Have oral suction available   CHL IP FOLLOW UP RECOMMENDATIONS 10/05/2015 Follow up Recommendations Inpatient Rehab;24 hour supervision/assistance   CHL IP FREQUENCY AND DURATION 02/09/2016 Speech Therapy Frequency (ACUTE  ONLY) min 2x/week Treatment Duration 2 weeks     Luanna Salk, [facility] SLP [phone]                  Microbiology: Recent Results (from the past 240 hour(s))  Culture, blood (routine x 2) Call MD if unable to obtain prior to antibiotics being given     Status: None (Preliminary result)   Collection Time: 02/06/16  5:56 PM  Result Value Ref Range Status   Specimen Description BLOOD LEFT HAND  Final   Special Requests IN PEDIATRIC BOTTLE [place]  Final   Culture   Final    NO GROWTH 2 DAYS Performed at [facility]    Report Status PENDING  Incomplete  Culture, blood (routine x 2) Call MD if unable to obtain prior to antibiotics being given     Status: None (Preliminary result)   Collection Time: 02/06/16  5:57 PM  Result Value Ref Range Status   Specimen Description BLOOD RIGHT ANTECUBITAL  Final   Special Requests IN PEDIATRIC BOTTLE [place]  Final   Culture   Final    NO GROWTH 2 DAYS Performed at [facility]    Report Status PENDING  Incomplete  Culture, sputum-assessment     Status: None   Collection Time: 02/07/16  6:14 AM  Result Value Ref Range Status   Specimen Description SPUTUM  Final   Special Requests NONE  Final   Sputum evaluation   Final    THIS SPECIMEN IS ACCEPTABLE. RESPIRATORY CULTURE REPORT TO FOLLOW.   Report Status 02/07/2016 FINAL  Final  Culture, respiratory (NON-Expectorated)     Status: None   Collection Time: 02/07/16  6:15 AM  Result Value Ref Range Status   Specimen Description SPUTUM  Final   Special Requests NONE  Final   Gram Stain   Final    ABUNDANT WBC PRESENT, PREDOMINANTLY PMN FEW SQUAMOUS EPITHELIAL CELLS PRESENT MODERATE GRAM NEGATIVE COCCOBACILLI FEW GRAM NEGATIVE RODS MODERATE GRAM POSITIVE COCCI IN CLUSTERS FEW GRAM POSITIVE COCCI IN PAIRS AND CHAINS FEW GRAM NEGATIVE COCCI RARE YEAST    Culture   Final    Consistent with normal respiratory flora. Performed at [facility]    Report Status 02/09/2016 FINAL   Final     Labs: Basic Metabolic Panel:  Recent Labs Lab 02/03/16 1404 02/06/16 1257 02/07/16 [id number] 02/08/16 0448 02/10/16 [id number]  NA 141 134* 135 137 134*  K 3.7 4.4 3.4* 3.5 3.5  CL 106 105 108 109 102  CO2 27 23 24 22 24   GLUCOSE 159* 136* 104* 109* 105*  BUN 13 15 14 18 12   CREATININE 0.65 0.76 0.66 0.49 0.55  CALCIUM 9.4 8.7* 8.4* 8.5* 8.7*  MG  --   --   --   --  2.1   Liver Function Tests:  Recent Labs Lab 02/03/16 1404 02/06/16 1257 02/07/16 0511  AST 37 48* 33  ALT 24 31 23   ALKPHOS 76 88 70  BILITOT 0.4 0.6 0.7  PROT 7.0 7.5 6.8  ALBUMIN 3.7 3.5 3.2*    Recent Labs Lab 02/06/16 1257  LIPASE 29   No results for input(s): AMMONIA in the last 168 hours. CBC:  Recent Labs Lab 02/03/16 1404 02/06/16 1257 02/07/16 0511 02/08/16 0448  WBC 9.1 18.8* 11.5* 10.2  NEUTROABS 6.8 16.6*  --   --   HGB 13.3 11.8* 11.2* 11.2*  HCT 40.0 34.5* 33.4* 33.0*  MCV 87.9 86.5 86.1 86.4  PLT 316.0 274 232 240   Cardiac Enzymes: No results for input(s): CKTOTAL, CKMB, CKMBINDEX, TROPONINI in the last 168 hours. BNP: BNP (last 3 results) No results for input(s): BNP in the last 8760 hours.  ProBNP (last 3 results) No results for input(s): PROBNP in the last 8760 hours.  CBG:  Recent Labs Lab 02/06/16 1309  GLUCAP 133*       Signed:  Kaylon Hitz MD, PhD  [facility] 02/10/2016, 12:41 PM

## 2016-02-10 NOTE — Progress Notes (Signed)
Speech Language Pathology Treatment: Dysphagia  Patient Details Name: Karen [name] MRN: [id number] DOB: 1922-07-01 Today's Date: 02/10/2016 Time: 1125-1200 SLP Time Calculation (min) (ACUTE ONLY): 35 min  Assessment / Plan / Recommendation Clinical Impression  Pt reports decreased coughing episodes today compared to yesterday.  RN reports pt with copious coughing after gulping Ensure - suspect secretion retention/Ensure film may have contributed to event.  SlP educated pt to importance of mouth care and aspiration precautions.    Assisted pt to consume tsps thin water after oral care and tsp/cup boluses of Ensure and tsps of thin water. + throat clear noted with thin which may indicate laryngeal penetration.  Advised to continue diet modified while acutely ill and follow up with [facility] SLP for advancement.  Water protocol info left for family.     HPI HPI: 82 yo female adm to [facility] with cough - weakness - fatigue.  Diagnosed with HCAP. PMH + for recent CVA 82 year old female who presents with cough and generalized weakness. History of aortic insufficiency, hypertension, and prior CVA with residual left-sided weakness. Lives at home with her granddaughter.  Swallow evaluation ordered.        SLP Plan    [facility]    Recommendations  Diet recommendations: Dysphagia 3 (mechanical soft);Nectar-thick liquid (water protocol) Medication Administration: Whole meds with puree Supervision: Staff to assist with self feeding;Full supervision/cueing for compensatory strategies Compensations: Slow rate;Small sips/bites (intermittent dry swallow) Postural Changes and/or Swallow Maneuvers: Seated upright 90 degrees                Oral Care Recommendations: Oral care BID       GO               Luanna Salk, [facility] SLP [phone]

## 2016-02-10 NOTE — Progress Notes (Signed)
VASCULAR LAB PRELIMINARY  PRELIMINARY  PRELIMINARY  PRELIMINARY  Bilateral lower extremity venous duplex completed.    Preliminary report:  Bilateral:  No evidence of DVT, superficial thrombosis, or Baker's Cyst.   Kriss Perleberg, RVS 02/10/2016, 9:10 AM

## 2016-02-10 NOTE — Progress Notes (Signed)
VASCULAR LAB PRELIMINARY  PRELIMINARY  PRELIMINARY  PRELIMINARY  Left upper extremity venous duplex completed.    Preliminary report:  Left :  No evidence of DVT or superficial thrombosis.    Lynell Kussman, RVS 02/10/2016, 9:11 AM

## 2016-02-10 NOTE — Progress Notes (Signed)
Date: February 10, 2016 tct-dawn Lorelle Formosa with [facility] have orders and will see the patient at home. Velva Harman, RN, BSN, [place]   [phone]

## 2016-02-11 ENCOUNTER — Telehealth: Payer: Self-pay

## 2016-02-11 NOTE — Telephone Encounter (Signed)
D/C: 02/10/16 To: Home  Spoke with pt's daughter, Juliann Pulse, and she states that pt is doing well. She is coughing but improving. She has no questions or concerns at this time.  Appt scheduled with Dr. Raliegh Ip 02/21/16 @ 4pm.     Transition Care Management Follow-up Telephone Call  How have you been since you were released from the hospital? Good, improving daily   Do you understand why you were in the hospital? yes   Do you understand the discharge instrcutions? yes  Items Reviewed:  Medications reviewed: yes  Allergies reviewed: yes  Dietary changes reviewed: yes  Referrals reviewed: yes   Functional Questionnaire:   Activities of Daily Living (ADLs):   She states they are independent in the following: pt requires assistance in all aspects States they require assistance with the following: ambulation, bathing and hygiene, feeding, continence, grooming, toileting and dressing   Any transportation issues/concerns?: no   Any patient concerns? no   Confirmed importance and date/time of follow-up visits scheduled: yes   Confirmed with patient if condition begins to worsen call PCP or go to the ER.  Patient was given the Call-a-Nurse line [phone]: yes

## 2016-02-12 LAB — CULTURE, BLOOD (ROUTINE X 2)
CULTURE: NO GROWTH
Culture: NO GROWTH

## 2016-02-13 DIAGNOSIS — I69322 Dysarthria following cerebral infarction: Secondary | ICD-10-CM | POA: Diagnosis not present

## 2016-02-13 DIAGNOSIS — F329 Major depressive disorder, single episode, unspecified: Secondary | ICD-10-CM | POA: Diagnosis not present

## 2016-02-13 DIAGNOSIS — R1310 Dysphagia, unspecified: Secondary | ICD-10-CM | POA: Diagnosis not present

## 2016-02-13 DIAGNOSIS — I69352 Hemiplegia and hemiparesis following cerebral infarction affecting left dominant side: Secondary | ICD-10-CM | POA: Diagnosis not present

## 2016-02-13 DIAGNOSIS — I10 Essential (primary) hypertension: Secondary | ICD-10-CM | POA: Diagnosis not present

## 2016-02-13 DIAGNOSIS — I69391 Dysphagia following cerebral infarction: Secondary | ICD-10-CM | POA: Diagnosis not present

## 2016-02-14 DIAGNOSIS — I10 Essential (primary) hypertension: Secondary | ICD-10-CM | POA: Diagnosis not present

## 2016-02-14 DIAGNOSIS — R1310 Dysphagia, unspecified: Secondary | ICD-10-CM | POA: Diagnosis not present

## 2016-02-14 DIAGNOSIS — I69352 Hemiplegia and hemiparesis following cerebral infarction affecting left dominant side: Secondary | ICD-10-CM | POA: Diagnosis not present

## 2016-02-14 DIAGNOSIS — I69391 Dysphagia following cerebral infarction: Secondary | ICD-10-CM | POA: Diagnosis not present

## 2016-02-14 DIAGNOSIS — I69322 Dysarthria following cerebral infarction: Secondary | ICD-10-CM | POA: Diagnosis not present

## 2016-02-14 DIAGNOSIS — F329 Major depressive disorder, single episode, unspecified: Secondary | ICD-10-CM | POA: Diagnosis not present

## 2016-02-20 DIAGNOSIS — I69391 Dysphagia following cerebral infarction: Secondary | ICD-10-CM | POA: Diagnosis not present

## 2016-02-20 DIAGNOSIS — K5730 Diverticulosis of large intestine without perforation or abscess without bleeding: Secondary | ICD-10-CM | POA: Diagnosis not present

## 2016-02-20 DIAGNOSIS — H548 Legal blindness, as defined in USA: Secondary | ICD-10-CM | POA: Diagnosis not present

## 2016-02-20 DIAGNOSIS — H409 Unspecified glaucoma: Secondary | ICD-10-CM | POA: Diagnosis not present

## 2016-02-20 DIAGNOSIS — G8929 Other chronic pain: Secondary | ICD-10-CM | POA: Diagnosis not present

## 2016-02-20 DIAGNOSIS — Z85038 Personal history of other malignant neoplasm of large intestine: Secondary | ICD-10-CM | POA: Diagnosis not present

## 2016-02-20 DIAGNOSIS — I69398 Other sequelae of cerebral infarction: Secondary | ICD-10-CM | POA: Diagnosis not present

## 2016-02-20 DIAGNOSIS — Z8701 Personal history of pneumonia (recurrent): Secondary | ICD-10-CM | POA: Diagnosis not present

## 2016-02-20 DIAGNOSIS — R1311 Dysphagia, oral phase: Secondary | ICD-10-CM | POA: Diagnosis not present

## 2016-02-20 DIAGNOSIS — E785 Hyperlipidemia, unspecified: Secondary | ICD-10-CM | POA: Diagnosis not present

## 2016-02-20 DIAGNOSIS — I252 Old myocardial infarction: Secondary | ICD-10-CM | POA: Diagnosis not present

## 2016-02-20 DIAGNOSIS — F329 Major depressive disorder, single episode, unspecified: Secondary | ICD-10-CM | POA: Diagnosis not present

## 2016-02-20 DIAGNOSIS — Z9181 History of falling: Secondary | ICD-10-CM | POA: Diagnosis not present

## 2016-02-20 DIAGNOSIS — I10 Essential (primary) hypertension: Secondary | ICD-10-CM | POA: Diagnosis not present

## 2016-02-20 DIAGNOSIS — M545 Low back pain: Secondary | ICD-10-CM | POA: Diagnosis not present

## 2016-02-20 DIAGNOSIS — Z7902 Long term (current) use of antithrombotics/antiplatelets: Secondary | ICD-10-CM | POA: Diagnosis not present

## 2016-02-20 DIAGNOSIS — I69352 Hemiplegia and hemiparesis following cerebral infarction affecting left dominant side: Secondary | ICD-10-CM | POA: Diagnosis not present

## 2016-02-20 DIAGNOSIS — I69322 Dysarthria following cerebral infarction: Secondary | ICD-10-CM | POA: Diagnosis not present

## 2016-02-21 ENCOUNTER — Encounter: Payer: Self-pay | Admitting: Internal Medicine

## 2016-02-21 ENCOUNTER — Ambulatory Visit (INDEPENDENT_AMBULATORY_CARE_PROVIDER_SITE_OTHER): Payer: Medicare Other | Admitting: Internal Medicine

## 2016-02-21 VITALS — BP 124/62 | HR 68 | Temp 97.5°F | Resp 18

## 2016-02-21 DIAGNOSIS — E784 Other hyperlipidemia: Secondary | ICD-10-CM

## 2016-02-21 DIAGNOSIS — J189 Pneumonia, unspecified organism: Secondary | ICD-10-CM | POA: Diagnosis not present

## 2016-02-21 DIAGNOSIS — H548 Legal blindness, as defined in USA: Secondary | ICD-10-CM | POA: Diagnosis not present

## 2016-02-21 DIAGNOSIS — E7849 Other hyperlipidemia: Secondary | ICD-10-CM

## 2016-02-21 DIAGNOSIS — I10 Essential (primary) hypertension: Secondary | ICD-10-CM | POA: Diagnosis not present

## 2016-02-21 DIAGNOSIS — I6350 Cerebral infarction due to unspecified occlusion or stenosis of unspecified cerebral artery: Secondary | ICD-10-CM | POA: Diagnosis not present

## 2016-02-21 DIAGNOSIS — I639 Cerebral infarction, unspecified: Secondary | ICD-10-CM | POA: Diagnosis not present

## 2016-02-21 NOTE — Patient Instructions (Signed)
Limit your sodium (Salt) intake  Please check your blood pressure on a regular basis.  If it is consistently greater than 150/90, please make an office appointment.  Call or return to clinic prn if these symptoms worsen or fail to improve as anticipated.  

## 2016-02-21 NOTE — Progress Notes (Signed)
Subjective:    Patient ID: Karen [name], female    DOB: 11/14/1922, 82 y.o.   MRN: [id number]  HPI  Admit date: 02/06/2016 Discharge date: 02/10/2016  Recommendations for Outpatient Follow-up:  1. F/u with PMD within a week  for hospital discharge follow up, repeat cbc/bmp at follow up 2. pmd to repeat cxr in 3-4 weeks  Discharge Diagnoses:       Active Hospital Problems   Diagnosis Date Noted  . CAP (community acquired pneumonia) 02/06/2016  . Cerebrovascular accident (CVA) due to occlusion of cerebral artery ([place])   . HLD (hyperlipidemia)   . Depression 01/26/2015   82 year old patient who is seen following a recent hospital discharge.  She was treated for community acquired pneumonia.  Since her discharge she has done well. She requires 24/7 assistance.  She has a left hemiparesis and is legally blind Denies any pulmonary complaints or fever.  No swallowing difficulty, coughing or choking  Hospital records reviewed Electrolytes  were normal.  The time of discharge and leukocytosis resolved  Past Medical History:  Diagnosis Date  . ANEMIA 10/21/2008  . AORTIC INSUFFICIENCY 11/26/2009  . BACK PAIN 06/27/2007  . COLON CANCER, PERSONAL HX 02/25/2010  . Diverticulosis of colon (without mention of hemorrhage) 08/14/2006  . GALLSTONES 10/09/2008  . GLAUCOMA NOS 08/14/2006  . H/O: stroke    behind right eye  . HYPERTENSION 08/14/2006  . HYPOKALEMIA, HX OF 11/26/2009  . MELENA, HX OF 10/12/2008  . NEOPLASM, MALIGNANT, COLON, CECUM 10/30/2008  . OCCLUSION, CENTRAL RETINAL ARTERY 08/14/2006  . PERSONAL HX COLONIC POLYPS 10/21/2008  . SYNCOPE, HX OF 08/14/2006  . URI 09/07/2008     Social History   Social History  . Marital status: Married    Spouse name: N/A  . Number of children: 3  . Years of education: N/A   Occupational History  . retired Retired   Social History Main Topics  . Smoking status: Never Smoker  . Smokeless tobacco: Never Used  . Alcohol use No  . Drug  use: No  . Sexual activity: Not on file   Other Topics Concern  . Not on file   Social History Narrative  . No narrative on file    Past Surgical History:  Procedure Laterality Date  . ANKLE FRACTURE SURGERY  2011   right  . APPENDECTOMY    . CATARACT EXTRACTION     bilateral  . COLON SURGERY     11/20/2008,gallbladder removed  . DILATION AND CURETTAGE OF UTERUS    . TONSILLECTOMY      Family History  Problem Relation Age of Onset  . Heart attack Father   . Breast cancer Sister   . Lung cancer Brother   . Stroke Neg Hx   . Heart disease Neg Hx     Allergies  Allergen Reactions  . Morphine And Related Shortness Of Breath  . Sulfa Antibiotics Itching and Swelling  . Tape Other (See Comments)    PLEASE USE COBAN WRAP/PATIENT BRUISES EASILY AND HER SKIN IS THIN  . Amoxicillin Itching and Rash  . Ciprofloxacin Itching and Rash  . Methocarbamol Itching  . Trimpex [Trimethoprim] Itching and Rash    Current Outpatient Prescriptions on File Prior to Visit  Medication Sig Dispense Refill  . acetaminophen (TYLENOL) 325 MG tablet Take 650 mg by mouth every 6 (six) hours as needed for moderate pain.    Marland Kitchen atenolol (TENORMIN) 50 MG tablet TAKE 1/2 TABLET BY MOUTH DAILY. [place]  tablet 5  . bimatoprost (LUMIGAN) 0.01 % SOLN Place 1 drop into the right eye at bedtime.    . brimonidine (ALPHAGAN P) 0.1 % SOLN Place 1 drop into both eyes every 8 (eight) hours.     . cholecalciferol (VITAMIN D) 1000 units tablet Take 1,000 Units by mouth daily.    . clopidogrel (PLAVIX) 75 MG tablet Take 1 tablet (75 mg total) by mouth daily. 30 tablet 5  . dorzolamide-timolol (COSOPT) 22.3-6.8 MG/ML ophthalmic solution Place 1 drop into both eyes 2 (two) times daily.     Marland Kitchen escitalopram (LEXAPRO) 10 MG tablet TAKE 1 TABLET (10 MG TOTAL) BY MOUTH DAILY. 90 tablet 1  . feeding supplement, ENSURE ENLIVE, (ENSURE ENLIVE) LIQD Take 237 mLs by mouth 2 (two) times daily between meals. 237 mL 12  . gabapentin  (NEURONTIN) 100 MG capsule Take 1 capsule (100 mg total) by mouth at bedtime. 90 capsule 3  . Maltodextrin-Xanthan Gum ([place]) POWD As need 10 Can 0  . Menthol-Methyl Salicylate (MUSCLE RUB) 10-15 % CREA Apply 1 application topically as needed (for hip pain). (Patient taking differently: Apply 1 application topically 3 (three) times daily as needed for muscle pain (for hip pain). ) 113 g 0  . neomycin-polymyxin-hydrocortisone (CORTISPORIN) 3.5-10000-1 ophthalmic suspension Place 3 drops into the left eye 4 (four) times daily. 7.5 mL 0  . tiZANidine (ZANAFLEX) 2 MG tablet Take 1 tablet (2 mg total) by mouth at bedtime. 30 tablet 5  . traMADol (ULTRAM) 50 MG tablet Take 1 tablet (50 mg total) by mouth at bedtime as needed. (Patient taking differently: Take 50 mg by mouth at bedtime as needed for severe pain. ) 30 tablet 0  . vitamin C (ASCORBIC ACID) 500 MG tablet Take 500 mg by mouth daily.    Marland Kitchen amLODipine (NORVASC) 2.5 MG tablet Take 1 tablet (2.5 mg total) by mouth daily. (Patient not taking: Reported on 02/21/2016) 60 tablet 5   No current facility-administered medications on file prior to visit.     BP 124/62 (BP Location: Right Arm, Patient Position: Sitting, Cuff Size: Normal)   Pulse 68   Temp 97.5 F (36.4 C) (Oral)   Resp 18   SpO2 98%      Review of Systems  Constitutional: Positive for activity change, appetite change and fatigue. Negative for fever.  HENT: Negative for congestion, dental problem, hearing loss, rhinorrhea, sinus pressure, sore throat and tinnitus.   Eyes: Negative for pain, discharge and visual disturbance.  Respiratory: Negative for cough and shortness of breath.   Cardiovascular: Negative for chest pain, palpitations and leg swelling.  Gastrointestinal: Negative for abdominal distention, abdominal pain, blood in stool, constipation, diarrhea, nausea and vomiting.  Genitourinary: Negative for difficulty urinating, dysuria, flank pain,  frequency, hematuria, pelvic pain, urgency, vaginal bleeding, vaginal discharge and vaginal pain.  Musculoskeletal: Negative for arthralgias, gait problem and joint swelling.  Skin: Negative for rash.  Neurological: Positive for weakness. Negative for dizziness, syncope, speech difficulty, numbness and headaches.  Hematological: Negative for adenopathy.  Psychiatric/Behavioral: Positive for decreased concentration and sleep disturbance. Negative for agitation, behavioral problems and dysphoric mood. The patient is not nervous/anxious.        Objective:   Physical Exam  Constitutional: She is oriented to person, place, and time. She appears well-developed and well-nourished.  HENT:  Head: Normocephalic.  Right Ear: External ear normal.  Left Ear: External ear normal.  Mouth/Throat: Oropharynx is clear and moist.  Eyes: Conjunctivae and EOM are  normal. Pupils are equal, round, and reactive to light.  Neck: Normal range of motion. Neck supple. No thyromegaly present.  Cardiovascular: Normal rate, regular rhythm, normal heart sounds and intact distal pulses.   Pulmonary/Chest: Effort normal and breath sounds normal.  Chest is clear O2 saturation 98%  Abdominal: Soft. Bowel sounds are normal. She exhibits no mass. There is no tenderness.  Musculoskeletal: Normal range of motion.  Lymphadenopathy:    She has no cervical adenopathy.  Neurological: She is alert and oriented to person, place, and time.  Skin: Skin is warm and dry. No rash noted.  Psychiatric: She has a normal mood and affect. Her behavior is normal.          Assessment & Plan:   Status post possible mission for kidney car pneumonia.  Clinically, appears well Cerebrovascular disease with left hemiparesis Legal blindness secondary to glaucoma Dyslipidemia Essential hypertension, stable  No change in medical regimen The patient does have neurology follow-up in 2 days Recheck here in 4 months or as  needed  [employer]

## 2016-02-21 NOTE — Progress Notes (Signed)
Pre visit review using our clinic review tool, if applicable. No additional management support is needed unless otherwise documented below in the visit note. 

## 2016-02-23 ENCOUNTER — Telehealth: Payer: Self-pay | Admitting: Internal Medicine

## 2016-02-23 ENCOUNTER — Ambulatory Visit (INDEPENDENT_AMBULATORY_CARE_PROVIDER_SITE_OTHER): Payer: Medicare Other | Admitting: Nurse Practitioner

## 2016-02-23 ENCOUNTER — Encounter: Payer: Self-pay | Admitting: Nurse Practitioner

## 2016-02-23 VITALS — BP 114/68 | HR 77 | Ht 66.0 in

## 2016-02-23 DIAGNOSIS — IMO0002 Reserved for concepts with insufficient information to code with codable children: Secondary | ICD-10-CM

## 2016-02-23 DIAGNOSIS — I639 Cerebral infarction, unspecified: Secondary | ICD-10-CM

## 2016-02-23 DIAGNOSIS — I10 Essential (primary) hypertension: Secondary | ICD-10-CM | POA: Diagnosis not present

## 2016-02-23 DIAGNOSIS — R531 Weakness: Secondary | ICD-10-CM

## 2016-02-23 DIAGNOSIS — H9190 Unspecified hearing loss, unspecified ear: Secondary | ICD-10-CM

## 2016-02-23 DIAGNOSIS — H548 Legal blindness, as defined in USA: Secondary | ICD-10-CM

## 2016-02-23 DIAGNOSIS — I69391 Dysphagia following cerebral infarction: Secondary | ICD-10-CM

## 2016-02-23 DIAGNOSIS — I63031 Cerebral infarction due to thrombosis of right carotid artery: Secondary | ICD-10-CM

## 2016-02-23 NOTE — Telephone Encounter (Signed)
Karen [name] w/ [facility] needs a verbal for PT  1 wk/1 2wk /6  Ok to leave message

## 2016-02-23 NOTE — Progress Notes (Signed)
[facility]  PATIENT: Karen [name] DOB: 07-31-1922   REASON FOR VISIT:  Follow-up for stroke HISTORY FROM: Patient and granddaughter Karen [name] ILLNESS:UPDATE 11/29/2017CM Karen [name], 82 year old female returns for follow-up with her granddaughter with whom she lives. She has history of stroke in July 2017. She had recent hospital admission for pneumonia on November 12 and was hospitalized for 4 days. She is now due to get physical therapy occupational therapy speech therapy and home health nursing. She is currently on Plavix for secondary stroke prevention without significant bruising and no bleeding. She has not had further stroke or TIA symptoms. Her Pravachol is on hold at present. She continues to have left-sided weakness. She is legally blind and very hard of hearing. Since her hospital admission she is to add thickening to her fluids, she is on mechanical soft diet. Blood pressure is well controlled in the office today at 114/68. She returns for reevaluation   HISTORY 8/31/17CMDoris Viona Gilmore [name] is a 82 y.o. female who went to bed in her normal state of health around 10 PM, on 10/02/15 and then when she awoke she try to get up and go to the bathroom and she noticed that her left leg was giving way. She called 911 and a code stroke was activated. She denies any sensation changes, does not notice any visual change.She has left sided weakness but still able to squeeze hand and lift up left leg this am. However, due to high SBP at 225, she received hydralazine IV, and her SBP down to 123. About one hour later, she was sitting with speech evaluation, started to have worsening neuro status, left side became hemiplegic. Put her back to bed, trendelenburg position, IVF bolus, sBP up to 145, left leg able to lift again and left arm also able to move but still weaker that before, started albumin 12.5g Q6h x . MRI right BG/CR infarct CTA H&N - b/l fetal PCAs,  diminutive BA and b/l BVJs, and b/l VA origin atherosclerosisCT perfusion scan - no large perfusion abnormality.2D Echo - EF 60-65%. No source of embolus LDL - 71 HgbA1c 5.4 she was changed from aspirin to Plavix for stroke prevention. Pravachol is on hold. She returns for reevaluation today her granddaughter with whom she lives. She is getting physical therapy and occupational therapy 2 times a week with improvement of her left-sided weakness. She has not had further stroke or TIA symptoms and she is tolerating Plavix well. She returns for reevaluation   REVIEW OF SYSTEMS: Full 14 system review of systems performed and notable only for those listed, all others are neg:  Constitutional: neg  Cardiovascular: neg Ear/Nose/Throat: Hearing loss, trouble swallowing  Skin: neg Eyes: Legally blind Respiratory: neg Gastroitestinal: neg  Hematology/Lymphatic: neg  Endocrine: neg Musculoskeletal:neg Allergy/Immunology: neg Neurological: Weakness Psychiatric: Depression, rare his hallucination Sleep : Insomnia at times, takes tizanidine   ALLERGIES: Allergies  Allergen Reactions  . Morphine And Related Shortness Of Breath  . Sulfa Antibiotics Itching and Swelling  . Tape Other (See Comments)    PLEASE USE COBAN WRAP/PATIENT BRUISES EASILY AND HER SKIN IS THIN  . Amoxicillin Itching and Rash  . Ciprofloxacin Itching and Rash  . Methocarbamol Itching  . Trimpex [Trimethoprim] Itching and Rash    HOME MEDICATIONS: Outpatient Medications Prior to Visit  Medication Sig Dispense Refill  . acetaminophen (TYLENOL) 325 MG tablet Take 650 mg by mouth every 6 (six) hours as needed for moderate pain.    Marland Kitchen  amLODipine (NORVASC) 2.5 MG tablet Take 1 tablet (2.5 mg total) by mouth daily. 60 tablet 5  . atenolol (TENORMIN) 50 MG tablet TAKE 1/2 TABLET BY MOUTH DAILY. 30 tablet 5  . bimatoprost (LUMIGAN) 0.01 % SOLN Place 1 drop into the right eye at bedtime.    . brimonidine (ALPHAGAN P) 0.1 % SOLN  Place 1 drop into both eyes every 8 (eight) hours.     . cholecalciferol (VITAMIN D) 1000 units tablet Take 1,000 Units by mouth daily.    . clopidogrel (PLAVIX) 75 MG tablet Take 1 tablet (75 mg total) by mouth daily. 30 tablet 5  . dorzolamide-timolol (COSOPT) 22.3-6.8 MG/ML ophthalmic solution Place 1 drop into both eyes 2 (two) times daily.     Marland Kitchen escitalopram (LEXAPRO) 10 MG tablet TAKE 1 TABLET (10 MG TOTAL) BY MOUTH DAILY. 90 tablet 1  . feeding supplement, ENSURE ENLIVE, (ENSURE ENLIVE) LIQD Take 237 mLs by mouth 2 (two) times daily between meals. 237 mL 12  . gabapentin (NEURONTIN) 100 MG capsule Take 1 capsule (100 mg total) by mouth at bedtime. 90 capsule 3  . Maltodextrin-Xanthan Gum ([place]) POWD As need 10 Can 0  . Menthol-Methyl Salicylate (MUSCLE RUB) 10-15 % CREA Apply 1 application topically as needed (for hip pain). (Patient taking differently: Apply 1 application topically 3 (three) times daily as needed for muscle pain (for hip pain). ) 113 g 0  . neomycin-polymyxin-hydrocortisone (CORTISPORIN) 3.5-10000-1 ophthalmic suspension Place 3 drops into the left eye 4 (four) times daily. 7.5 mL 0  . tiZANidine (ZANAFLEX) 2 MG tablet Take 1 tablet (2 mg total) by mouth at bedtime. 30 tablet 5  . traMADol (ULTRAM) 50 MG tablet Take 1 tablet (50 mg total) by mouth at bedtime as needed. (Patient taking differently: Take 25 mg by mouth at bedtime as needed for severe pain. ) 30 tablet 0  . vitamin C (ASCORBIC ACID) 500 MG tablet Take 500 mg by mouth daily.     No facility-administered medications prior to visit.     PAST MEDICAL HISTORY: Past Medical History:  Diagnosis Date  . ANEMIA 10/21/2008  . AORTIC INSUFFICIENCY 11/26/2009  . BACK PAIN 06/27/2007  . COLON CANCER, PERSONAL HX 02/25/2010  . Diverticulosis of colon (without mention of hemorrhage) 08/14/2006  . GALLSTONES 10/09/2008  . GLAUCOMA NOS 08/14/2006  . H/O: stroke    behind right eye  . HYPERTENSION  08/14/2006  . HYPOKALEMIA, HX OF 11/26/2009  . MELENA, HX OF 10/12/2008  . NEOPLASM, MALIGNANT, COLON, CECUM 10/30/2008  . OCCLUSION, CENTRAL RETINAL ARTERY 08/14/2006  . PERSONAL HX COLONIC POLYPS 10/21/2008  . SYNCOPE, HX OF 08/14/2006  . URI 09/07/2008    PAST SURGICAL HISTORY: Past Surgical History:  Procedure Laterality Date  . ANKLE FRACTURE SURGERY  2011   right  . APPENDECTOMY    . CATARACT EXTRACTION     bilateral  . COLON SURGERY     11/20/2008,gallbladder removed  . DILATION AND CURETTAGE OF UTERUS    . TONSILLECTOMY      FAMILY HISTORY: Family History  Problem Relation Age of Onset  . Heart attack Father   . Breast cancer Sister   . Lung cancer Brother   . Stroke Neg Hx   . Heart disease Neg Hx     SOCIAL HISTORY: Social History   Social History  . Marital status: Married    Spouse name: N/A  . Number of children: 3  . Years of education: N/A  Occupational History  . retired Retired   Social History Main Topics  . Smoking status: Never Smoker  . Smokeless tobacco: Never Used  . Alcohol use No  . Drug use: No  . Sexual activity: Not on file   Other Topics Concern  . Not on file   Social History Narrative  . No narrative on file     PHYSICAL EXAM  Vitals:   02/23/16 1010  BP: 114/68  Pulse: 77  Height: 5\' 6"  (1.676 m)   There is no height or weight on file to calculate BMI.  Generalized: Well developed, in no acute distress  Head: normocephalic and atraumatic,. Oropharynx benign  Neck: Supple, no carotid bruits  Cardiac: Regular rate rhythm, no murmur  Musculoskeletal: No deformity   Neurological examination   Mentation: Alert   Follows all commands speech and language fluent.   Cranial nerve II-XII: Pupils were equal round reactive to light extraocular movements were full, visual field were full on confrontational test but decreased visual acuity to finger count bilaterally.. Mild left facial droop ,hearing was intact to finger rubbing  bilaterally. Uvula tongue midline. head turning and shoulder shrug were normal and symmetric.Tongue protrusion into cheek strength was normal. Motor: normal bulk and tone, full strength in the BUE, BLE, on the right, 2 out of 5 left upper extremity and 3 out of 5 left lower extremity  Sensory: normal and symmetric to light touch, pinprick, and  Vibration In the upper and lower extremities,  Coordination: finger-nose-finger, normal in the right with  hand tremor and dysmetria, unable to perform on the left Reflexes: Symmetric upper and lower, plantar responses were flexor bilaterally. Gait and Station: In wheelchair not tested   DIAGNOSTIC DATA (LABS, IMAGING, TESTING) - I reviewed patient records, labs, notes, testing and imaging myself where available.  Lab Results  Component Value Date   WBC 10.2 02/08/2016   HGB 11.2 (L) 02/08/2016   HCT 33.0 (L) 02/08/2016   MCV 86.4 02/08/2016   PLT 240 02/08/2016      Component Value Date/Time   NA 134 (L) 02/10/2016 0433   NA 141 06/30/2015 0955   K 3.5 02/10/2016 0433   K 3.7 06/30/2015 0955   CL 102 02/10/2016 0433   CL 103 06/24/2012 0932   CO2 24 02/10/2016 0433   CO2 27 06/30/2015 0955   GLUCOSE 105 (H) 02/10/2016 0433   GLUCOSE 110 06/30/2015 0955   GLUCOSE 110 (H) 06/24/2012 0932   BUN 12 02/10/2016 0433   BUN 13.9 06/30/2015 0955   CREATININE 0.55 02/10/2016 0433   CREATININE 0.8 06/30/2015 0955   CALCIUM 8.7 (L) 02/10/2016 0433   CALCIUM 9.2 06/30/2015 0955   PROT 6.8 02/07/2016 0511   PROT 7.4 06/30/2015 0955   ALBUMIN 3.2 (L) 02/07/2016 0511   ALBUMIN 3.4 (L) 06/30/2015 0955   AST 33 02/07/2016 0511   AST 26 06/30/2015 0955   ALT 23 02/07/2016 0511   ALT 10 06/30/2015 0955   ALKPHOS 70 02/07/2016 0511   ALKPHOS 61 06/30/2015 0955   BILITOT 0.7 02/07/2016 0511   BILITOT 0.36 06/30/2015 0955   GFRNONAA >60 02/10/2016 0433   GFRAA >60 02/10/2016 0433   Lab Results  Component Value Date   CHOL 120 10/04/2015   HDL  33 (L) 10/04/2015   LDLCALC 71 10/04/2015   TRIG 79 10/04/2015   CHOLHDL 3.6 10/04/2015   Lab Results  Component Value Date   HGBA1C 5.4 10/03/2015   No results found for:  [id number] Lab Results  Component Value Date   TSH 2.170 04/03/2015      ASSESSMENT AND PLAN  82 y.o. year old female  has a past medical history hypertension previous stroke legally blind due to glaucoma presenting with left-sided weakness MRI confirmed right BG CR infarct . CTA H&N - b/l fetal PCAs, diminutive BA and b/l BVJs, and b/l VA origin atherosclerosisCT perfusion scan - no large perfusion abnormality.2D Echo - EF 60-65%. No source of embolus LDL - 71HgbA1c 5.4 The patient is a current patient of Dr. Leonie Man  who is out of the office today . This note is sent to the work in doctor.     PLAN: Stressed the importance of management of risk factors to prevent further stroke Continue Plavix for secondary stroke prevention Maintain strict control of hypertension with blood pressure goal below 130/90, today's reading 114/68 continue antihypertensive medications Control of diabetes with hemoglobin A1c below 6.5 followed by primary care most recent hemoglobin A1c 5.4 Cholesterol with LDL cholesterol less than 70,hold Pravochol for now will repeat lipid profile in 6 months, most recent LDL 71 Continue physical therapy and occupational therapy,  Speech and [place] nursing post hospitalization for pneumonia  Follow-up in 6 months Dennie Bible, [facility], [facility], [facility] [address], [place], [place] [address] [phone]

## 2016-02-23 NOTE — Patient Instructions (Signed)
Continue Plavix for secondary stroke prevention Maintain strict control of hypertension with blood pressure goal below 130/90, today's reading 114/68 continue antihypertensive medications Control of diabetes with hemoglobin A1c below 6.5 followed by primary care most recent hemoglobin A1c 5.4 Cholesterol with LDL cholesterol less than 70,hold Pravochol for now will repeat lipid profile in 6 months Continue physical therapy and occupational therapy,  Speech and [place] nursing post hospitalization for pneumonia  Follow-up in 6 months

## 2016-02-24 ENCOUNTER — Telehealth: Payer: Self-pay | Admitting: *Deleted

## 2016-02-24 ENCOUNTER — Telehealth: Payer: Self-pay | Admitting: Internal Medicine

## 2016-02-24 NOTE — Telephone Encounter (Signed)
Spoke to [place] with [facility], verbal orders given for PT 1 x a week for 1 week, 2 x's a week for 6 weeks for pt. Okay per Dr. Raliegh Ip. Tatiana verbalized understanding.

## 2016-02-24 NOTE — Telephone Encounter (Signed)
Sallyanne Havers, ST from [facility] is requesting verbal orders for speech therapy 1week6 to address dysphagia......Marland KitchenCalled ST back and gave verbal orders via secured voicemail

## 2016-02-24 NOTE — Telephone Encounter (Signed)
[facility] need a verbal order for the RN to go out next week to reevaluate the pt.

## 2016-02-25 NOTE — Telephone Encounter (Signed)
Left detailed message on Elizabeth's voicemail, okay to re-evaluate pt next week per Dr.K. Any questions please call office.

## 2016-02-25 NOTE — Telephone Encounter (Signed)
okay

## 2016-02-25 NOTE — Telephone Encounter (Signed)
Okay for RN to see pt for re-evaluation next week?

## 2016-02-29 DIAGNOSIS — I69391 Dysphagia following cerebral infarction: Secondary | ICD-10-CM | POA: Diagnosis not present

## 2016-02-29 DIAGNOSIS — I69322 Dysarthria following cerebral infarction: Secondary | ICD-10-CM | POA: Diagnosis not present

## 2016-02-29 DIAGNOSIS — I69398 Other sequelae of cerebral infarction: Secondary | ICD-10-CM | POA: Diagnosis not present

## 2016-02-29 DIAGNOSIS — H548 Legal blindness, as defined in USA: Secondary | ICD-10-CM | POA: Diagnosis not present

## 2016-02-29 DIAGNOSIS — R1311 Dysphagia, oral phase: Secondary | ICD-10-CM | POA: Diagnosis not present

## 2016-02-29 DIAGNOSIS — I69352 Hemiplegia and hemiparesis following cerebral infarction affecting left dominant side: Secondary | ICD-10-CM | POA: Diagnosis not present

## 2016-03-02 DIAGNOSIS — I69391 Dysphagia following cerebral infarction: Secondary | ICD-10-CM | POA: Diagnosis not present

## 2016-03-02 DIAGNOSIS — I69398 Other sequelae of cerebral infarction: Secondary | ICD-10-CM | POA: Diagnosis not present

## 2016-03-02 DIAGNOSIS — R1311 Dysphagia, oral phase: Secondary | ICD-10-CM | POA: Diagnosis not present

## 2016-03-02 DIAGNOSIS — H548 Legal blindness, as defined in USA: Secondary | ICD-10-CM | POA: Diagnosis not present

## 2016-03-02 DIAGNOSIS — I69322 Dysarthria following cerebral infarction: Secondary | ICD-10-CM | POA: Diagnosis not present

## 2016-03-02 DIAGNOSIS — I69352 Hemiplegia and hemiparesis following cerebral infarction affecting left dominant side: Secondary | ICD-10-CM | POA: Diagnosis not present

## 2016-03-03 DIAGNOSIS — R1311 Dysphagia, oral phase: Secondary | ICD-10-CM | POA: Diagnosis not present

## 2016-03-03 DIAGNOSIS — I69322 Dysarthria following cerebral infarction: Secondary | ICD-10-CM | POA: Diagnosis not present

## 2016-03-03 DIAGNOSIS — I69391 Dysphagia following cerebral infarction: Secondary | ICD-10-CM | POA: Diagnosis not present

## 2016-03-03 DIAGNOSIS — I69352 Hemiplegia and hemiparesis following cerebral infarction affecting left dominant side: Secondary | ICD-10-CM | POA: Diagnosis not present

## 2016-03-03 DIAGNOSIS — I69398 Other sequelae of cerebral infarction: Secondary | ICD-10-CM | POA: Diagnosis not present

## 2016-03-03 DIAGNOSIS — R1310 Dysphagia, unspecified: Secondary | ICD-10-CM | POA: Diagnosis not present

## 2016-03-03 DIAGNOSIS — J690 Pneumonitis due to inhalation of food and vomit: Secondary | ICD-10-CM | POA: Diagnosis not present

## 2016-03-03 DIAGNOSIS — H409 Unspecified glaucoma: Secondary | ICD-10-CM | POA: Diagnosis not present

## 2016-03-03 DIAGNOSIS — I10 Essential (primary) hypertension: Secondary | ICD-10-CM | POA: Diagnosis not present

## 2016-03-03 DIAGNOSIS — I679 Cerebrovascular disease, unspecified: Secondary | ICD-10-CM | POA: Diagnosis not present

## 2016-03-03 DIAGNOSIS — I672 Cerebral atherosclerosis: Secondary | ICD-10-CM | POA: Diagnosis not present

## 2016-03-03 DIAGNOSIS — H548 Legal blindness, as defined in USA: Secondary | ICD-10-CM | POA: Diagnosis not present

## 2016-03-03 DIAGNOSIS — F339 Major depressive disorder, recurrent, unspecified: Secondary | ICD-10-CM | POA: Diagnosis not present

## 2016-03-06 DIAGNOSIS — I679 Cerebrovascular disease, unspecified: Secondary | ICD-10-CM | POA: Diagnosis not present

## 2016-03-06 DIAGNOSIS — R1310 Dysphagia, unspecified: Secondary | ICD-10-CM | POA: Diagnosis not present

## 2016-03-06 DIAGNOSIS — J690 Pneumonitis due to inhalation of food and vomit: Secondary | ICD-10-CM | POA: Diagnosis not present

## 2016-03-06 DIAGNOSIS — F339 Major depressive disorder, recurrent, unspecified: Secondary | ICD-10-CM | POA: Diagnosis not present

## 2016-03-06 DIAGNOSIS — I672 Cerebral atherosclerosis: Secondary | ICD-10-CM | POA: Diagnosis not present

## 2016-03-06 DIAGNOSIS — I10 Essential (primary) hypertension: Secondary | ICD-10-CM | POA: Diagnosis not present

## 2016-03-07 DIAGNOSIS — R1311 Dysphagia, oral phase: Secondary | ICD-10-CM | POA: Diagnosis not present

## 2016-03-07 DIAGNOSIS — I69398 Other sequelae of cerebral infarction: Secondary | ICD-10-CM | POA: Diagnosis not present

## 2016-03-07 DIAGNOSIS — H548 Legal blindness, as defined in USA: Secondary | ICD-10-CM | POA: Diagnosis not present

## 2016-03-07 DIAGNOSIS — I69322 Dysarthria following cerebral infarction: Secondary | ICD-10-CM | POA: Diagnosis not present

## 2016-03-07 DIAGNOSIS — I69391 Dysphagia following cerebral infarction: Secondary | ICD-10-CM | POA: Diagnosis not present

## 2016-03-07 DIAGNOSIS — I69352 Hemiplegia and hemiparesis following cerebral infarction affecting left dominant side: Secondary | ICD-10-CM | POA: Diagnosis not present

## 2016-03-09 DIAGNOSIS — R1311 Dysphagia, oral phase: Secondary | ICD-10-CM | POA: Diagnosis not present

## 2016-03-09 DIAGNOSIS — I69391 Dysphagia following cerebral infarction: Secondary | ICD-10-CM | POA: Diagnosis not present

## 2016-03-09 DIAGNOSIS — I69322 Dysarthria following cerebral infarction: Secondary | ICD-10-CM | POA: Diagnosis not present

## 2016-03-09 DIAGNOSIS — I69398 Other sequelae of cerebral infarction: Secondary | ICD-10-CM | POA: Diagnosis not present

## 2016-03-09 DIAGNOSIS — I69352 Hemiplegia and hemiparesis following cerebral infarction affecting left dominant side: Secondary | ICD-10-CM | POA: Diagnosis not present

## 2016-03-09 DIAGNOSIS — H548 Legal blindness, as defined in USA: Secondary | ICD-10-CM | POA: Diagnosis not present

## 2016-03-10 ENCOUNTER — Ambulatory Visit: Payer: Medicare Other | Admitting: Internal Medicine

## 2016-03-10 DIAGNOSIS — I672 Cerebral atherosclerosis: Secondary | ICD-10-CM | POA: Diagnosis not present

## 2016-03-10 DIAGNOSIS — I10 Essential (primary) hypertension: Secondary | ICD-10-CM | POA: Diagnosis not present

## 2016-03-10 DIAGNOSIS — F339 Major depressive disorder, recurrent, unspecified: Secondary | ICD-10-CM | POA: Diagnosis not present

## 2016-03-10 DIAGNOSIS — R1310 Dysphagia, unspecified: Secondary | ICD-10-CM | POA: Diagnosis not present

## 2016-03-10 DIAGNOSIS — J690 Pneumonitis due to inhalation of food and vomit: Secondary | ICD-10-CM | POA: Diagnosis not present

## 2016-03-10 DIAGNOSIS — I679 Cerebrovascular disease, unspecified: Secondary | ICD-10-CM | POA: Diagnosis not present

## 2016-03-14 ENCOUNTER — Telehealth: Payer: Self-pay | Admitting: Internal Medicine

## 2016-03-14 DIAGNOSIS — I679 Cerebrovascular disease, unspecified: Secondary | ICD-10-CM | POA: Diagnosis not present

## 2016-03-14 DIAGNOSIS — I672 Cerebral atherosclerosis: Secondary | ICD-10-CM | POA: Diagnosis not present

## 2016-03-14 DIAGNOSIS — R1310 Dysphagia, unspecified: Secondary | ICD-10-CM | POA: Diagnosis not present

## 2016-03-14 DIAGNOSIS — I10 Essential (primary) hypertension: Secondary | ICD-10-CM | POA: Diagnosis not present

## 2016-03-14 DIAGNOSIS — J690 Pneumonitis due to inhalation of food and vomit: Secondary | ICD-10-CM | POA: Diagnosis not present

## 2016-03-14 DIAGNOSIS — F339 Major depressive disorder, recurrent, unspecified: Secondary | ICD-10-CM | POA: Diagnosis not present

## 2016-03-14 NOTE — Telephone Encounter (Signed)
Please see message and advise 

## 2016-03-14 NOTE — Telephone Encounter (Signed)
Karen [name] with hospice would like a call back to discuss pt's pain management.  Pt waking up at night with pain

## 2016-03-16 DIAGNOSIS — R1310 Dysphagia, unspecified: Secondary | ICD-10-CM | POA: Diagnosis not present

## 2016-03-16 DIAGNOSIS — I10 Essential (primary) hypertension: Secondary | ICD-10-CM | POA: Diagnosis not present

## 2016-03-16 DIAGNOSIS — I679 Cerebrovascular disease, unspecified: Secondary | ICD-10-CM | POA: Diagnosis not present

## 2016-03-16 DIAGNOSIS — J690 Pneumonitis due to inhalation of food and vomit: Secondary | ICD-10-CM | POA: Diagnosis not present

## 2016-03-16 DIAGNOSIS — F339 Major depressive disorder, recurrent, unspecified: Secondary | ICD-10-CM | POA: Diagnosis not present

## 2016-03-16 DIAGNOSIS — I672 Cerebral atherosclerosis: Secondary | ICD-10-CM | POA: Diagnosis not present

## 2016-03-18 DIAGNOSIS — I10 Essential (primary) hypertension: Secondary | ICD-10-CM | POA: Diagnosis not present

## 2016-03-18 DIAGNOSIS — I672 Cerebral atherosclerosis: Secondary | ICD-10-CM | POA: Diagnosis not present

## 2016-03-18 DIAGNOSIS — J690 Pneumonitis due to inhalation of food and vomit: Secondary | ICD-10-CM | POA: Diagnosis not present

## 2016-03-18 DIAGNOSIS — I679 Cerebrovascular disease, unspecified: Secondary | ICD-10-CM | POA: Diagnosis not present

## 2016-03-18 DIAGNOSIS — R1310 Dysphagia, unspecified: Secondary | ICD-10-CM | POA: Diagnosis not present

## 2016-03-18 DIAGNOSIS — F339 Major depressive disorder, recurrent, unspecified: Secondary | ICD-10-CM | POA: Diagnosis not present

## 2016-03-21 DIAGNOSIS — I672 Cerebral atherosclerosis: Secondary | ICD-10-CM | POA: Diagnosis not present

## 2016-03-21 DIAGNOSIS — R1310 Dysphagia, unspecified: Secondary | ICD-10-CM | POA: Diagnosis not present

## 2016-03-21 DIAGNOSIS — F339 Major depressive disorder, recurrent, unspecified: Secondary | ICD-10-CM | POA: Diagnosis not present

## 2016-03-21 DIAGNOSIS — J690 Pneumonitis due to inhalation of food and vomit: Secondary | ICD-10-CM | POA: Diagnosis not present

## 2016-03-21 DIAGNOSIS — I679 Cerebrovascular disease, unspecified: Secondary | ICD-10-CM | POA: Diagnosis not present

## 2016-03-21 DIAGNOSIS — I10 Essential (primary) hypertension: Secondary | ICD-10-CM | POA: Diagnosis not present

## 2016-03-22 DIAGNOSIS — J690 Pneumonitis due to inhalation of food and vomit: Secondary | ICD-10-CM | POA: Diagnosis not present

## 2016-03-22 DIAGNOSIS — R1310 Dysphagia, unspecified: Secondary | ICD-10-CM | POA: Diagnosis not present

## 2016-03-22 DIAGNOSIS — I679 Cerebrovascular disease, unspecified: Secondary | ICD-10-CM | POA: Diagnosis not present

## 2016-03-22 DIAGNOSIS — F339 Major depressive disorder, recurrent, unspecified: Secondary | ICD-10-CM | POA: Diagnosis not present

## 2016-03-22 DIAGNOSIS — I10 Essential (primary) hypertension: Secondary | ICD-10-CM | POA: Diagnosis not present

## 2016-03-22 DIAGNOSIS — I672 Cerebral atherosclerosis: Secondary | ICD-10-CM | POA: Diagnosis not present

## 2016-03-24 DIAGNOSIS — I10 Essential (primary) hypertension: Secondary | ICD-10-CM | POA: Diagnosis not present

## 2016-03-24 DIAGNOSIS — I679 Cerebrovascular disease, unspecified: Secondary | ICD-10-CM | POA: Diagnosis not present

## 2016-03-24 DIAGNOSIS — J690 Pneumonitis due to inhalation of food and vomit: Secondary | ICD-10-CM | POA: Diagnosis not present

## 2016-03-24 DIAGNOSIS — I672 Cerebral atherosclerosis: Secondary | ICD-10-CM | POA: Diagnosis not present

## 2016-03-24 DIAGNOSIS — R1310 Dysphagia, unspecified: Secondary | ICD-10-CM | POA: Diagnosis not present

## 2016-03-24 DIAGNOSIS — F339 Major depressive disorder, recurrent, unspecified: Secondary | ICD-10-CM | POA: Diagnosis not present

## 2016-03-27 ENCOUNTER — Other Ambulatory Visit: Payer: Self-pay | Admitting: Internal Medicine

## 2016-03-27 DIAGNOSIS — F339 Major depressive disorder, recurrent, unspecified: Secondary | ICD-10-CM | POA: Diagnosis not present

## 2016-03-27 DIAGNOSIS — H409 Unspecified glaucoma: Secondary | ICD-10-CM | POA: Diagnosis not present

## 2016-03-27 DIAGNOSIS — I672 Cerebral atherosclerosis: Secondary | ICD-10-CM | POA: Diagnosis not present

## 2016-03-27 DIAGNOSIS — I679 Cerebrovascular disease, unspecified: Secondary | ICD-10-CM | POA: Diagnosis not present

## 2016-03-27 DIAGNOSIS — J690 Pneumonitis due to inhalation of food and vomit: Secondary | ICD-10-CM | POA: Diagnosis not present

## 2016-03-27 DIAGNOSIS — R1310 Dysphagia, unspecified: Secondary | ICD-10-CM | POA: Diagnosis not present

## 2016-03-27 DIAGNOSIS — I10 Essential (primary) hypertension: Secondary | ICD-10-CM | POA: Diagnosis not present

## 2016-03-28 DIAGNOSIS — R1310 Dysphagia, unspecified: Secondary | ICD-10-CM | POA: Diagnosis not present

## 2016-03-28 DIAGNOSIS — F339 Major depressive disorder, recurrent, unspecified: Secondary | ICD-10-CM | POA: Diagnosis not present

## 2016-03-28 DIAGNOSIS — I672 Cerebral atherosclerosis: Secondary | ICD-10-CM | POA: Diagnosis not present

## 2016-03-28 DIAGNOSIS — I679 Cerebrovascular disease, unspecified: Secondary | ICD-10-CM | POA: Diagnosis not present

## 2016-03-28 DIAGNOSIS — J690 Pneumonitis due to inhalation of food and vomit: Secondary | ICD-10-CM | POA: Diagnosis not present

## 2016-03-28 DIAGNOSIS — I10 Essential (primary) hypertension: Secondary | ICD-10-CM | POA: Diagnosis not present

## 2016-03-29 DIAGNOSIS — I672 Cerebral atherosclerosis: Secondary | ICD-10-CM | POA: Diagnosis not present

## 2016-03-29 DIAGNOSIS — F339 Major depressive disorder, recurrent, unspecified: Secondary | ICD-10-CM | POA: Diagnosis not present

## 2016-03-29 DIAGNOSIS — I679 Cerebrovascular disease, unspecified: Secondary | ICD-10-CM | POA: Diagnosis not present

## 2016-03-29 DIAGNOSIS — R1310 Dysphagia, unspecified: Secondary | ICD-10-CM | POA: Diagnosis not present

## 2016-03-29 DIAGNOSIS — I10 Essential (primary) hypertension: Secondary | ICD-10-CM | POA: Diagnosis not present

## 2016-03-29 DIAGNOSIS — J690 Pneumonitis due to inhalation of food and vomit: Secondary | ICD-10-CM | POA: Diagnosis not present

## 2016-03-31 DIAGNOSIS — F339 Major depressive disorder, recurrent, unspecified: Secondary | ICD-10-CM | POA: Diagnosis not present

## 2016-03-31 DIAGNOSIS — R1310 Dysphagia, unspecified: Secondary | ICD-10-CM | POA: Diagnosis not present

## 2016-03-31 DIAGNOSIS — I10 Essential (primary) hypertension: Secondary | ICD-10-CM | POA: Diagnosis not present

## 2016-03-31 DIAGNOSIS — I679 Cerebrovascular disease, unspecified: Secondary | ICD-10-CM | POA: Diagnosis not present

## 2016-03-31 DIAGNOSIS — I672 Cerebral atherosclerosis: Secondary | ICD-10-CM | POA: Diagnosis not present

## 2016-03-31 DIAGNOSIS — J690 Pneumonitis due to inhalation of food and vomit: Secondary | ICD-10-CM | POA: Diagnosis not present

## 2016-04-04 DIAGNOSIS — R1310 Dysphagia, unspecified: Secondary | ICD-10-CM | POA: Diagnosis not present

## 2016-04-04 DIAGNOSIS — F339 Major depressive disorder, recurrent, unspecified: Secondary | ICD-10-CM | POA: Diagnosis not present

## 2016-04-04 DIAGNOSIS — J690 Pneumonitis due to inhalation of food and vomit: Secondary | ICD-10-CM | POA: Diagnosis not present

## 2016-04-04 DIAGNOSIS — I672 Cerebral atherosclerosis: Secondary | ICD-10-CM | POA: Diagnosis not present

## 2016-04-04 DIAGNOSIS — I10 Essential (primary) hypertension: Secondary | ICD-10-CM | POA: Diagnosis not present

## 2016-04-04 DIAGNOSIS — I679 Cerebrovascular disease, unspecified: Secondary | ICD-10-CM | POA: Diagnosis not present

## 2016-04-05 DIAGNOSIS — I10 Essential (primary) hypertension: Secondary | ICD-10-CM | POA: Diagnosis not present

## 2016-04-05 DIAGNOSIS — I679 Cerebrovascular disease, unspecified: Secondary | ICD-10-CM | POA: Diagnosis not present

## 2016-04-05 DIAGNOSIS — R1310 Dysphagia, unspecified: Secondary | ICD-10-CM | POA: Diagnosis not present

## 2016-04-05 DIAGNOSIS — I672 Cerebral atherosclerosis: Secondary | ICD-10-CM | POA: Diagnosis not present

## 2016-04-05 DIAGNOSIS — F339 Major depressive disorder, recurrent, unspecified: Secondary | ICD-10-CM | POA: Diagnosis not present

## 2016-04-05 DIAGNOSIS — J690 Pneumonitis due to inhalation of food and vomit: Secondary | ICD-10-CM | POA: Diagnosis not present

## 2016-04-06 ENCOUNTER — Telehealth: Payer: Self-pay | Admitting: Internal Medicine

## 2016-04-06 ENCOUNTER — Other Ambulatory Visit: Payer: Self-pay | Admitting: Internal Medicine

## 2016-04-06 DIAGNOSIS — I679 Cerebrovascular disease, unspecified: Secondary | ICD-10-CM | POA: Diagnosis not present

## 2016-04-06 DIAGNOSIS — F339 Major depressive disorder, recurrent, unspecified: Secondary | ICD-10-CM | POA: Diagnosis not present

## 2016-04-06 DIAGNOSIS — J690 Pneumonitis due to inhalation of food and vomit: Secondary | ICD-10-CM | POA: Diagnosis not present

## 2016-04-06 DIAGNOSIS — I10 Essential (primary) hypertension: Secondary | ICD-10-CM | POA: Diagnosis not present

## 2016-04-06 DIAGNOSIS — R1310 Dysphagia, unspecified: Secondary | ICD-10-CM | POA: Diagnosis not present

## 2016-04-06 DIAGNOSIS — I672 Cerebral atherosclerosis: Secondary | ICD-10-CM | POA: Diagnosis not present

## 2016-04-06 NOTE — Telephone Encounter (Signed)
Pt need new Rx for Tramadol   Pharm:  [facility] [address]

## 2016-04-07 DIAGNOSIS — I10 Essential (primary) hypertension: Secondary | ICD-10-CM | POA: Diagnosis not present

## 2016-04-07 DIAGNOSIS — I672 Cerebral atherosclerosis: Secondary | ICD-10-CM | POA: Diagnosis not present

## 2016-04-07 DIAGNOSIS — I679 Cerebrovascular disease, unspecified: Secondary | ICD-10-CM | POA: Diagnosis not present

## 2016-04-07 DIAGNOSIS — J690 Pneumonitis due to inhalation of food and vomit: Secondary | ICD-10-CM | POA: Diagnosis not present

## 2016-04-07 DIAGNOSIS — R1310 Dysphagia, unspecified: Secondary | ICD-10-CM | POA: Diagnosis not present

## 2016-04-07 DIAGNOSIS — F339 Major depressive disorder, recurrent, unspecified: Secondary | ICD-10-CM | POA: Diagnosis not present

## 2016-04-11 DIAGNOSIS — I679 Cerebrovascular disease, unspecified: Secondary | ICD-10-CM | POA: Diagnosis not present

## 2016-04-11 DIAGNOSIS — I672 Cerebral atherosclerosis: Secondary | ICD-10-CM | POA: Diagnosis not present

## 2016-04-11 DIAGNOSIS — F339 Major depressive disorder, recurrent, unspecified: Secondary | ICD-10-CM | POA: Diagnosis not present

## 2016-04-11 DIAGNOSIS — J690 Pneumonitis due to inhalation of food and vomit: Secondary | ICD-10-CM | POA: Diagnosis not present

## 2016-04-11 DIAGNOSIS — R1310 Dysphagia, unspecified: Secondary | ICD-10-CM | POA: Diagnosis not present

## 2016-04-11 DIAGNOSIS — I10 Essential (primary) hypertension: Secondary | ICD-10-CM | POA: Diagnosis not present

## 2016-04-13 DIAGNOSIS — I679 Cerebrovascular disease, unspecified: Secondary | ICD-10-CM | POA: Diagnosis not present

## 2016-04-13 DIAGNOSIS — F339 Major depressive disorder, recurrent, unspecified: Secondary | ICD-10-CM | POA: Diagnosis not present

## 2016-04-13 DIAGNOSIS — I672 Cerebral atherosclerosis: Secondary | ICD-10-CM | POA: Diagnosis not present

## 2016-04-13 DIAGNOSIS — J690 Pneumonitis due to inhalation of food and vomit: Secondary | ICD-10-CM | POA: Diagnosis not present

## 2016-04-13 DIAGNOSIS — I10 Essential (primary) hypertension: Secondary | ICD-10-CM | POA: Diagnosis not present

## 2016-04-13 DIAGNOSIS — R1310 Dysphagia, unspecified: Secondary | ICD-10-CM | POA: Diagnosis not present

## 2016-04-15 DIAGNOSIS — I672 Cerebral atherosclerosis: Secondary | ICD-10-CM | POA: Diagnosis not present

## 2016-04-15 DIAGNOSIS — I679 Cerebrovascular disease, unspecified: Secondary | ICD-10-CM | POA: Diagnosis not present

## 2016-04-15 DIAGNOSIS — F339 Major depressive disorder, recurrent, unspecified: Secondary | ICD-10-CM | POA: Diagnosis not present

## 2016-04-15 DIAGNOSIS — J690 Pneumonitis due to inhalation of food and vomit: Secondary | ICD-10-CM | POA: Diagnosis not present

## 2016-04-15 DIAGNOSIS — R1310 Dysphagia, unspecified: Secondary | ICD-10-CM | POA: Diagnosis not present

## 2016-04-15 DIAGNOSIS — I10 Essential (primary) hypertension: Secondary | ICD-10-CM | POA: Diagnosis not present

## 2016-04-17 DIAGNOSIS — I10 Essential (primary) hypertension: Secondary | ICD-10-CM | POA: Diagnosis not present

## 2016-04-17 DIAGNOSIS — I672 Cerebral atherosclerosis: Secondary | ICD-10-CM | POA: Diagnosis not present

## 2016-04-17 DIAGNOSIS — R1310 Dysphagia, unspecified: Secondary | ICD-10-CM | POA: Diagnosis not present

## 2016-04-17 DIAGNOSIS — J690 Pneumonitis due to inhalation of food and vomit: Secondary | ICD-10-CM | POA: Diagnosis not present

## 2016-04-17 DIAGNOSIS — F339 Major depressive disorder, recurrent, unspecified: Secondary | ICD-10-CM | POA: Diagnosis not present

## 2016-04-17 DIAGNOSIS — I679 Cerebrovascular disease, unspecified: Secondary | ICD-10-CM | POA: Diagnosis not present

## 2016-04-19 DIAGNOSIS — I10 Essential (primary) hypertension: Secondary | ICD-10-CM | POA: Diagnosis not present

## 2016-04-19 DIAGNOSIS — I672 Cerebral atherosclerosis: Secondary | ICD-10-CM | POA: Diagnosis not present

## 2016-04-19 DIAGNOSIS — F339 Major depressive disorder, recurrent, unspecified: Secondary | ICD-10-CM | POA: Diagnosis not present

## 2016-04-19 DIAGNOSIS — I679 Cerebrovascular disease, unspecified: Secondary | ICD-10-CM | POA: Diagnosis not present

## 2016-04-19 DIAGNOSIS — J690 Pneumonitis due to inhalation of food and vomit: Secondary | ICD-10-CM | POA: Diagnosis not present

## 2016-04-19 DIAGNOSIS — R1310 Dysphagia, unspecified: Secondary | ICD-10-CM | POA: Diagnosis not present

## 2016-04-21 DIAGNOSIS — I672 Cerebral atherosclerosis: Secondary | ICD-10-CM | POA: Diagnosis not present

## 2016-04-21 DIAGNOSIS — I679 Cerebrovascular disease, unspecified: Secondary | ICD-10-CM | POA: Diagnosis not present

## 2016-04-21 DIAGNOSIS — J690 Pneumonitis due to inhalation of food and vomit: Secondary | ICD-10-CM | POA: Diagnosis not present

## 2016-04-21 DIAGNOSIS — I10 Essential (primary) hypertension: Secondary | ICD-10-CM | POA: Diagnosis not present

## 2016-04-21 DIAGNOSIS — F339 Major depressive disorder, recurrent, unspecified: Secondary | ICD-10-CM | POA: Diagnosis not present

## 2016-04-21 DIAGNOSIS — R1310 Dysphagia, unspecified: Secondary | ICD-10-CM | POA: Diagnosis not present

## 2016-04-24 DIAGNOSIS — I679 Cerebrovascular disease, unspecified: Secondary | ICD-10-CM | POA: Diagnosis not present

## 2016-04-24 DIAGNOSIS — R1310 Dysphagia, unspecified: Secondary | ICD-10-CM | POA: Diagnosis not present

## 2016-04-24 DIAGNOSIS — F339 Major depressive disorder, recurrent, unspecified: Secondary | ICD-10-CM | POA: Diagnosis not present

## 2016-04-24 DIAGNOSIS — I10 Essential (primary) hypertension: Secondary | ICD-10-CM | POA: Diagnosis not present

## 2016-04-24 DIAGNOSIS — J690 Pneumonitis due to inhalation of food and vomit: Secondary | ICD-10-CM | POA: Diagnosis not present

## 2016-04-24 DIAGNOSIS — I672 Cerebral atherosclerosis: Secondary | ICD-10-CM | POA: Diagnosis not present

## 2016-04-26 DIAGNOSIS — F339 Major depressive disorder, recurrent, unspecified: Secondary | ICD-10-CM | POA: Diagnosis not present

## 2016-04-26 DIAGNOSIS — I679 Cerebrovascular disease, unspecified: Secondary | ICD-10-CM | POA: Diagnosis not present

## 2016-04-26 DIAGNOSIS — J690 Pneumonitis due to inhalation of food and vomit: Secondary | ICD-10-CM | POA: Diagnosis not present

## 2016-04-26 DIAGNOSIS — I672 Cerebral atherosclerosis: Secondary | ICD-10-CM | POA: Diagnosis not present

## 2016-04-26 DIAGNOSIS — R1310 Dysphagia, unspecified: Secondary | ICD-10-CM | POA: Diagnosis not present

## 2016-04-26 DIAGNOSIS — I10 Essential (primary) hypertension: Secondary | ICD-10-CM | POA: Diagnosis not present

## 2016-04-27 DIAGNOSIS — H409 Unspecified glaucoma: Secondary | ICD-10-CM | POA: Diagnosis not present

## 2016-04-27 DIAGNOSIS — R1310 Dysphagia, unspecified: Secondary | ICD-10-CM | POA: Diagnosis not present

## 2016-04-27 DIAGNOSIS — J690 Pneumonitis due to inhalation of food and vomit: Secondary | ICD-10-CM | POA: Diagnosis not present

## 2016-04-27 DIAGNOSIS — I10 Essential (primary) hypertension: Secondary | ICD-10-CM | POA: Diagnosis not present

## 2016-04-27 DIAGNOSIS — F339 Major depressive disorder, recurrent, unspecified: Secondary | ICD-10-CM | POA: Diagnosis not present

## 2016-04-27 DIAGNOSIS — I672 Cerebral atherosclerosis: Secondary | ICD-10-CM | POA: Diagnosis not present

## 2016-04-27 DIAGNOSIS — I679 Cerebrovascular disease, unspecified: Secondary | ICD-10-CM | POA: Diagnosis not present

## 2016-04-28 DIAGNOSIS — J690 Pneumonitis due to inhalation of food and vomit: Secondary | ICD-10-CM | POA: Diagnosis not present

## 2016-04-28 DIAGNOSIS — I10 Essential (primary) hypertension: Secondary | ICD-10-CM | POA: Diagnosis not present

## 2016-04-28 DIAGNOSIS — I672 Cerebral atherosclerosis: Secondary | ICD-10-CM | POA: Diagnosis not present

## 2016-04-28 DIAGNOSIS — R1310 Dysphagia, unspecified: Secondary | ICD-10-CM | POA: Diagnosis not present

## 2016-04-28 DIAGNOSIS — I679 Cerebrovascular disease, unspecified: Secondary | ICD-10-CM | POA: Diagnosis not present

## 2016-04-28 DIAGNOSIS — F339 Major depressive disorder, recurrent, unspecified: Secondary | ICD-10-CM | POA: Diagnosis not present

## 2016-04-29 DIAGNOSIS — I672 Cerebral atherosclerosis: Secondary | ICD-10-CM | POA: Diagnosis not present

## 2016-04-29 DIAGNOSIS — I10 Essential (primary) hypertension: Secondary | ICD-10-CM | POA: Diagnosis not present

## 2016-04-29 DIAGNOSIS — I679 Cerebrovascular disease, unspecified: Secondary | ICD-10-CM | POA: Diagnosis not present

## 2016-04-29 DIAGNOSIS — R1310 Dysphagia, unspecified: Secondary | ICD-10-CM | POA: Diagnosis not present

## 2016-04-29 DIAGNOSIS — F339 Major depressive disorder, recurrent, unspecified: Secondary | ICD-10-CM | POA: Diagnosis not present

## 2016-04-29 DIAGNOSIS — J690 Pneumonitis due to inhalation of food and vomit: Secondary | ICD-10-CM | POA: Diagnosis not present

## 2016-05-05 DIAGNOSIS — I10 Essential (primary) hypertension: Secondary | ICD-10-CM | POA: Diagnosis not present

## 2016-05-05 DIAGNOSIS — I679 Cerebrovascular disease, unspecified: Secondary | ICD-10-CM | POA: Diagnosis not present

## 2016-05-05 DIAGNOSIS — F339 Major depressive disorder, recurrent, unspecified: Secondary | ICD-10-CM | POA: Diagnosis not present

## 2016-05-05 DIAGNOSIS — I672 Cerebral atherosclerosis: Secondary | ICD-10-CM | POA: Diagnosis not present

## 2016-05-05 DIAGNOSIS — R1310 Dysphagia, unspecified: Secondary | ICD-10-CM | POA: Diagnosis not present

## 2016-05-05 DIAGNOSIS — J690 Pneumonitis due to inhalation of food and vomit: Secondary | ICD-10-CM | POA: Diagnosis not present

## 2016-05-06 DIAGNOSIS — I672 Cerebral atherosclerosis: Secondary | ICD-10-CM | POA: Diagnosis not present

## 2016-05-06 DIAGNOSIS — I10 Essential (primary) hypertension: Secondary | ICD-10-CM | POA: Diagnosis not present

## 2016-05-06 DIAGNOSIS — R1310 Dysphagia, unspecified: Secondary | ICD-10-CM | POA: Diagnosis not present

## 2016-05-06 DIAGNOSIS — F339 Major depressive disorder, recurrent, unspecified: Secondary | ICD-10-CM | POA: Diagnosis not present

## 2016-05-06 DIAGNOSIS — J690 Pneumonitis due to inhalation of food and vomit: Secondary | ICD-10-CM | POA: Diagnosis not present

## 2016-05-06 DIAGNOSIS — I679 Cerebrovascular disease, unspecified: Secondary | ICD-10-CM | POA: Diagnosis not present

## 2016-05-08 DIAGNOSIS — R1310 Dysphagia, unspecified: Secondary | ICD-10-CM | POA: Diagnosis not present

## 2016-05-08 DIAGNOSIS — I679 Cerebrovascular disease, unspecified: Secondary | ICD-10-CM | POA: Diagnosis not present

## 2016-05-08 DIAGNOSIS — I672 Cerebral atherosclerosis: Secondary | ICD-10-CM | POA: Diagnosis not present

## 2016-05-08 DIAGNOSIS — J690 Pneumonitis due to inhalation of food and vomit: Secondary | ICD-10-CM | POA: Diagnosis not present

## 2016-05-08 DIAGNOSIS — F339 Major depressive disorder, recurrent, unspecified: Secondary | ICD-10-CM | POA: Diagnosis not present

## 2016-05-08 DIAGNOSIS — I10 Essential (primary) hypertension: Secondary | ICD-10-CM | POA: Diagnosis not present

## 2016-05-09 ENCOUNTER — Telehealth: Payer: Self-pay | Admitting: Internal Medicine

## 2016-05-09 ENCOUNTER — Other Ambulatory Visit: Payer: Self-pay | Admitting: Internal Medicine

## 2016-05-09 DIAGNOSIS — I10 Essential (primary) hypertension: Secondary | ICD-10-CM | POA: Diagnosis not present

## 2016-05-09 DIAGNOSIS — R1310 Dysphagia, unspecified: Secondary | ICD-10-CM | POA: Diagnosis not present

## 2016-05-09 DIAGNOSIS — I672 Cerebral atherosclerosis: Secondary | ICD-10-CM | POA: Diagnosis not present

## 2016-05-09 DIAGNOSIS — I679 Cerebrovascular disease, unspecified: Secondary | ICD-10-CM | POA: Diagnosis not present

## 2016-05-09 DIAGNOSIS — J690 Pneumonitis due to inhalation of food and vomit: Secondary | ICD-10-CM | POA: Diagnosis not present

## 2016-05-09 DIAGNOSIS — F339 Major depressive disorder, recurrent, unspecified: Secondary | ICD-10-CM | POA: Diagnosis not present

## 2016-05-09 MED ORDER — HALOPERIDOL 2 MG PO TABS: 2.0000 mg | ORAL_TABLET | Freq: Four times a day (QID) | ORAL | Status: AC | PRN

## 2016-05-09 NOTE — Telephone Encounter (Signed)
Karen [name] with [facility] states pt started 03/916 Haldol 2mg  every 4 hrs/ prn  She would like pt's chart updated with this information.

## 2016-05-09 NOTE — Telephone Encounter (Signed)
Please see message below

## 2016-05-16 DIAGNOSIS — I679 Cerebrovascular disease, unspecified: Secondary | ICD-10-CM | POA: Diagnosis not present

## 2016-05-16 DIAGNOSIS — I672 Cerebral atherosclerosis: Secondary | ICD-10-CM | POA: Diagnosis not present

## 2016-05-16 DIAGNOSIS — F339 Major depressive disorder, recurrent, unspecified: Secondary | ICD-10-CM | POA: Diagnosis not present

## 2016-05-16 DIAGNOSIS — I10 Essential (primary) hypertension: Secondary | ICD-10-CM | POA: Diagnosis not present

## 2016-05-16 DIAGNOSIS — R1310 Dysphagia, unspecified: Secondary | ICD-10-CM | POA: Diagnosis not present

## 2016-05-16 DIAGNOSIS — J690 Pneumonitis due to inhalation of food and vomit: Secondary | ICD-10-CM | POA: Diagnosis not present

## 2016-05-21 DIAGNOSIS — R1310 Dysphagia, unspecified: Secondary | ICD-10-CM | POA: Diagnosis not present

## 2016-05-21 DIAGNOSIS — J690 Pneumonitis due to inhalation of food and vomit: Secondary | ICD-10-CM | POA: Diagnosis not present

## 2016-05-21 DIAGNOSIS — F339 Major depressive disorder, recurrent, unspecified: Secondary | ICD-10-CM | POA: Diagnosis not present

## 2016-05-21 DIAGNOSIS — I679 Cerebrovascular disease, unspecified: Secondary | ICD-10-CM | POA: Diagnosis not present

## 2016-05-21 DIAGNOSIS — I10 Essential (primary) hypertension: Secondary | ICD-10-CM | POA: Diagnosis not present

## 2016-05-21 DIAGNOSIS — I672 Cerebral atherosclerosis: Secondary | ICD-10-CM | POA: Diagnosis not present

## 2016-05-23 ENCOUNTER — Other Ambulatory Visit: Payer: Self-pay | Admitting: Internal Medicine

## 2016-05-23 DIAGNOSIS — R1310 Dysphagia, unspecified: Secondary | ICD-10-CM | POA: Diagnosis not present

## 2016-05-23 DIAGNOSIS — I679 Cerebrovascular disease, unspecified: Secondary | ICD-10-CM | POA: Diagnosis not present

## 2016-05-23 DIAGNOSIS — I672 Cerebral atherosclerosis: Secondary | ICD-10-CM | POA: Diagnosis not present

## 2016-05-23 DIAGNOSIS — J690 Pneumonitis due to inhalation of food and vomit: Secondary | ICD-10-CM | POA: Diagnosis not present

## 2016-05-23 DIAGNOSIS — I10 Essential (primary) hypertension: Secondary | ICD-10-CM | POA: Diagnosis not present

## 2016-05-23 DIAGNOSIS — F339 Major depressive disorder, recurrent, unspecified: Secondary | ICD-10-CM | POA: Diagnosis not present

## 2016-05-24 DIAGNOSIS — I672 Cerebral atherosclerosis: Secondary | ICD-10-CM | POA: Diagnosis not present

## 2016-05-24 DIAGNOSIS — J690 Pneumonitis due to inhalation of food and vomit: Secondary | ICD-10-CM | POA: Diagnosis not present

## 2016-05-24 DIAGNOSIS — I679 Cerebrovascular disease, unspecified: Secondary | ICD-10-CM | POA: Diagnosis not present

## 2016-05-24 DIAGNOSIS — I10 Essential (primary) hypertension: Secondary | ICD-10-CM | POA: Diagnosis not present

## 2016-05-24 DIAGNOSIS — R1310 Dysphagia, unspecified: Secondary | ICD-10-CM | POA: Diagnosis not present

## 2016-05-24 DIAGNOSIS — F339 Major depressive disorder, recurrent, unspecified: Secondary | ICD-10-CM | POA: Diagnosis not present

## 2016-05-25 DIAGNOSIS — I672 Cerebral atherosclerosis: Secondary | ICD-10-CM | POA: Diagnosis not present

## 2016-05-25 DIAGNOSIS — H409 Unspecified glaucoma: Secondary | ICD-10-CM | POA: Diagnosis not present

## 2016-05-25 DIAGNOSIS — J690 Pneumonitis due to inhalation of food and vomit: Secondary | ICD-10-CM | POA: Diagnosis not present

## 2016-05-25 DIAGNOSIS — R1310 Dysphagia, unspecified: Secondary | ICD-10-CM | POA: Diagnosis not present

## 2016-05-25 DIAGNOSIS — I10 Essential (primary) hypertension: Secondary | ICD-10-CM | POA: Diagnosis not present

## 2016-05-25 DIAGNOSIS — I679 Cerebrovascular disease, unspecified: Secondary | ICD-10-CM | POA: Diagnosis not present

## 2016-05-25 DIAGNOSIS — F339 Major depressive disorder, recurrent, unspecified: Secondary | ICD-10-CM | POA: Diagnosis not present

## 2016-05-26 DIAGNOSIS — F339 Major depressive disorder, recurrent, unspecified: Secondary | ICD-10-CM | POA: Diagnosis not present

## 2016-05-26 DIAGNOSIS — I672 Cerebral atherosclerosis: Secondary | ICD-10-CM | POA: Diagnosis not present

## 2016-05-26 DIAGNOSIS — I679 Cerebrovascular disease, unspecified: Secondary | ICD-10-CM | POA: Diagnosis not present

## 2016-05-26 DIAGNOSIS — I10 Essential (primary) hypertension: Secondary | ICD-10-CM | POA: Diagnosis not present

## 2016-05-26 DIAGNOSIS — R1310 Dysphagia, unspecified: Secondary | ICD-10-CM | POA: Diagnosis not present

## 2016-05-26 DIAGNOSIS — J690 Pneumonitis due to inhalation of food and vomit: Secondary | ICD-10-CM | POA: Diagnosis not present

## 2016-05-29 DIAGNOSIS — R1310 Dysphagia, unspecified: Secondary | ICD-10-CM | POA: Diagnosis not present

## 2016-05-29 DIAGNOSIS — J690 Pneumonitis due to inhalation of food and vomit: Secondary | ICD-10-CM | POA: Diagnosis not present

## 2016-05-29 DIAGNOSIS — F339 Major depressive disorder, recurrent, unspecified: Secondary | ICD-10-CM | POA: Diagnosis not present

## 2016-05-29 DIAGNOSIS — I679 Cerebrovascular disease, unspecified: Secondary | ICD-10-CM | POA: Diagnosis not present

## 2016-05-29 DIAGNOSIS — I10 Essential (primary) hypertension: Secondary | ICD-10-CM | POA: Diagnosis not present

## 2016-05-29 DIAGNOSIS — I672 Cerebral atherosclerosis: Secondary | ICD-10-CM | POA: Diagnosis not present

## 2016-05-30 DIAGNOSIS — R1310 Dysphagia, unspecified: Secondary | ICD-10-CM | POA: Diagnosis not present

## 2016-05-30 DIAGNOSIS — J690 Pneumonitis due to inhalation of food and vomit: Secondary | ICD-10-CM | POA: Diagnosis not present

## 2016-05-30 DIAGNOSIS — I10 Essential (primary) hypertension: Secondary | ICD-10-CM | POA: Diagnosis not present

## 2016-05-30 DIAGNOSIS — F339 Major depressive disorder, recurrent, unspecified: Secondary | ICD-10-CM | POA: Diagnosis not present

## 2016-05-30 DIAGNOSIS — I679 Cerebrovascular disease, unspecified: Secondary | ICD-10-CM | POA: Diagnosis not present

## 2016-05-30 DIAGNOSIS — I672 Cerebral atherosclerosis: Secondary | ICD-10-CM | POA: Diagnosis not present

## 2016-06-02 DIAGNOSIS — I679 Cerebrovascular disease, unspecified: Secondary | ICD-10-CM | POA: Diagnosis not present

## 2016-06-02 DIAGNOSIS — R1310 Dysphagia, unspecified: Secondary | ICD-10-CM | POA: Diagnosis not present

## 2016-06-02 DIAGNOSIS — I10 Essential (primary) hypertension: Secondary | ICD-10-CM | POA: Diagnosis not present

## 2016-06-02 DIAGNOSIS — J690 Pneumonitis due to inhalation of food and vomit: Secondary | ICD-10-CM | POA: Diagnosis not present

## 2016-06-02 DIAGNOSIS — F339 Major depressive disorder, recurrent, unspecified: Secondary | ICD-10-CM | POA: Diagnosis not present

## 2016-06-02 DIAGNOSIS — I672 Cerebral atherosclerosis: Secondary | ICD-10-CM | POA: Diagnosis not present

## 2016-06-06 DIAGNOSIS — I672 Cerebral atherosclerosis: Secondary | ICD-10-CM | POA: Diagnosis not present

## 2016-06-06 DIAGNOSIS — R1310 Dysphagia, unspecified: Secondary | ICD-10-CM | POA: Diagnosis not present

## 2016-06-06 DIAGNOSIS — F339 Major depressive disorder, recurrent, unspecified: Secondary | ICD-10-CM | POA: Diagnosis not present

## 2016-06-06 DIAGNOSIS — I10 Essential (primary) hypertension: Secondary | ICD-10-CM | POA: Diagnosis not present

## 2016-06-06 DIAGNOSIS — J690 Pneumonitis due to inhalation of food and vomit: Secondary | ICD-10-CM | POA: Diagnosis not present

## 2016-06-06 DIAGNOSIS — I679 Cerebrovascular disease, unspecified: Secondary | ICD-10-CM | POA: Diagnosis not present

## 2016-06-08 DIAGNOSIS — J690 Pneumonitis due to inhalation of food and vomit: Secondary | ICD-10-CM | POA: Diagnosis not present

## 2016-06-08 DIAGNOSIS — I679 Cerebrovascular disease, unspecified: Secondary | ICD-10-CM | POA: Diagnosis not present

## 2016-06-08 DIAGNOSIS — R1310 Dysphagia, unspecified: Secondary | ICD-10-CM | POA: Diagnosis not present

## 2016-06-08 DIAGNOSIS — I10 Essential (primary) hypertension: Secondary | ICD-10-CM | POA: Diagnosis not present

## 2016-06-08 DIAGNOSIS — I672 Cerebral atherosclerosis: Secondary | ICD-10-CM | POA: Diagnosis not present

## 2016-06-08 DIAGNOSIS — F339 Major depressive disorder, recurrent, unspecified: Secondary | ICD-10-CM | POA: Diagnosis not present

## 2016-06-09 ENCOUNTER — Telehealth: Payer: Self-pay | Admitting: Internal Medicine

## 2016-06-09 NOTE — Telephone Encounter (Signed)
FYI  Per Horris Latino @ Palliative Care state pt passed away at home on June 16, 2016 at 4:27 pm

## 2016-06-12 NOTE — Telephone Encounter (Signed)
Noted thank you

## 2016-06-20 ENCOUNTER — Ambulatory Visit: Payer: Medicare Other | Admitting: Internal Medicine

## 2016-06-21 ENCOUNTER — Ambulatory Visit: Payer: Medicare Other | Admitting: Internal Medicine

## 2016-06-25 DEATH — deceased

## 2016-08-29 ENCOUNTER — Ambulatory Visit: Payer: Medicare Other | Admitting: Nurse Practitioner

## 2016-08-30 ENCOUNTER — Encounter: Payer: Self-pay | Admitting: Nurse Practitioner

## 2016-09-19 ENCOUNTER — Telehealth: Payer: Self-pay | Admitting: Nurse Practitioner

## 2016-09-19 NOTE — Telephone Encounter (Signed)
Karen [name] patient's daughter called to let us know patient passed away on 07/01/2016.

## 2017-02-10 IMAGING — CR DG RIBS W/ CHEST 3+V*R*
4 series · 4 of 4 positions shown · non-contrast
Comparison: 03/16/2014

CLINICAL DATA: Fall at home yesterday walking down stairs, right
side pain, right flank pain, right rib pain

EXAM:
RIGHT RIBS AND CHEST - 3+ VIEW

[x chest ap (1 of 4)]
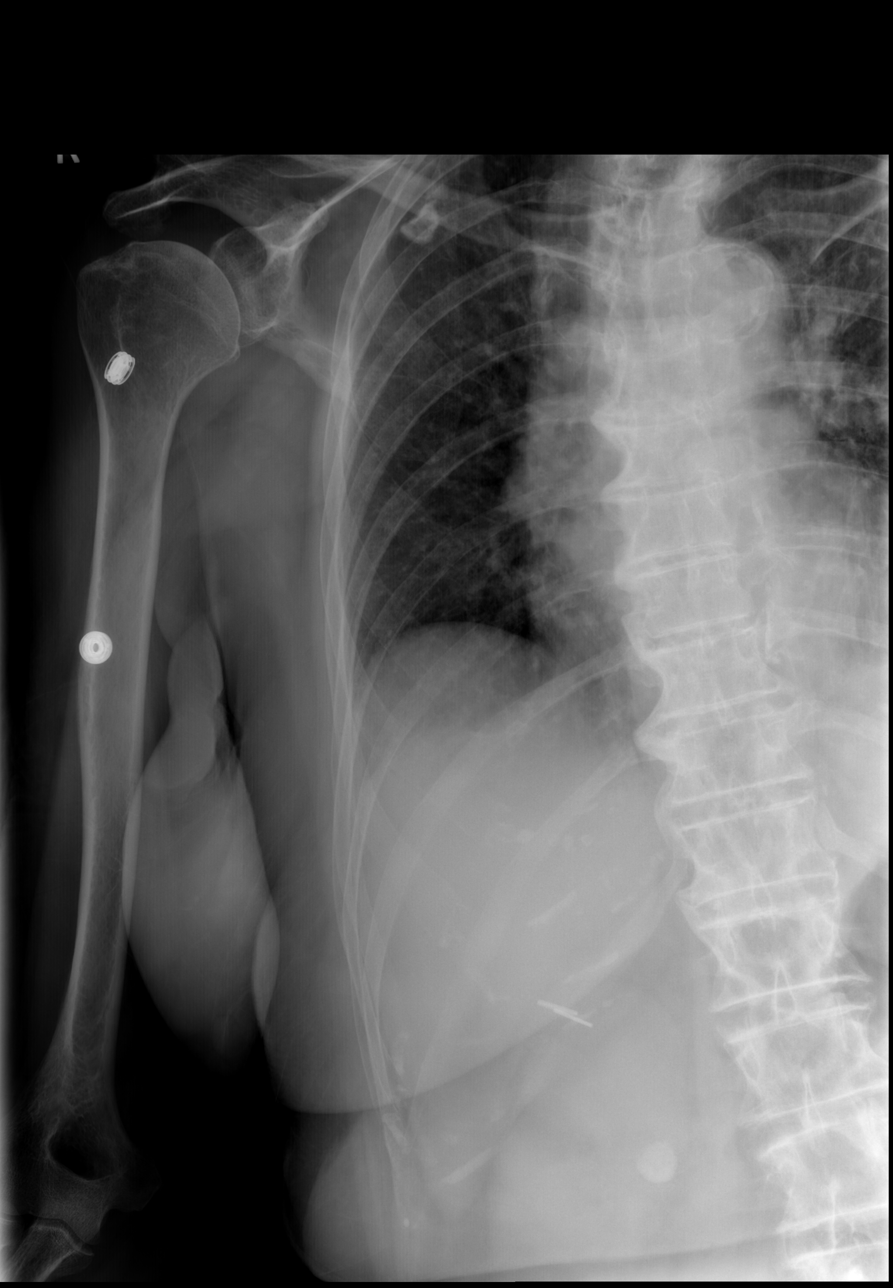

[x chest ap (2 of 4)]
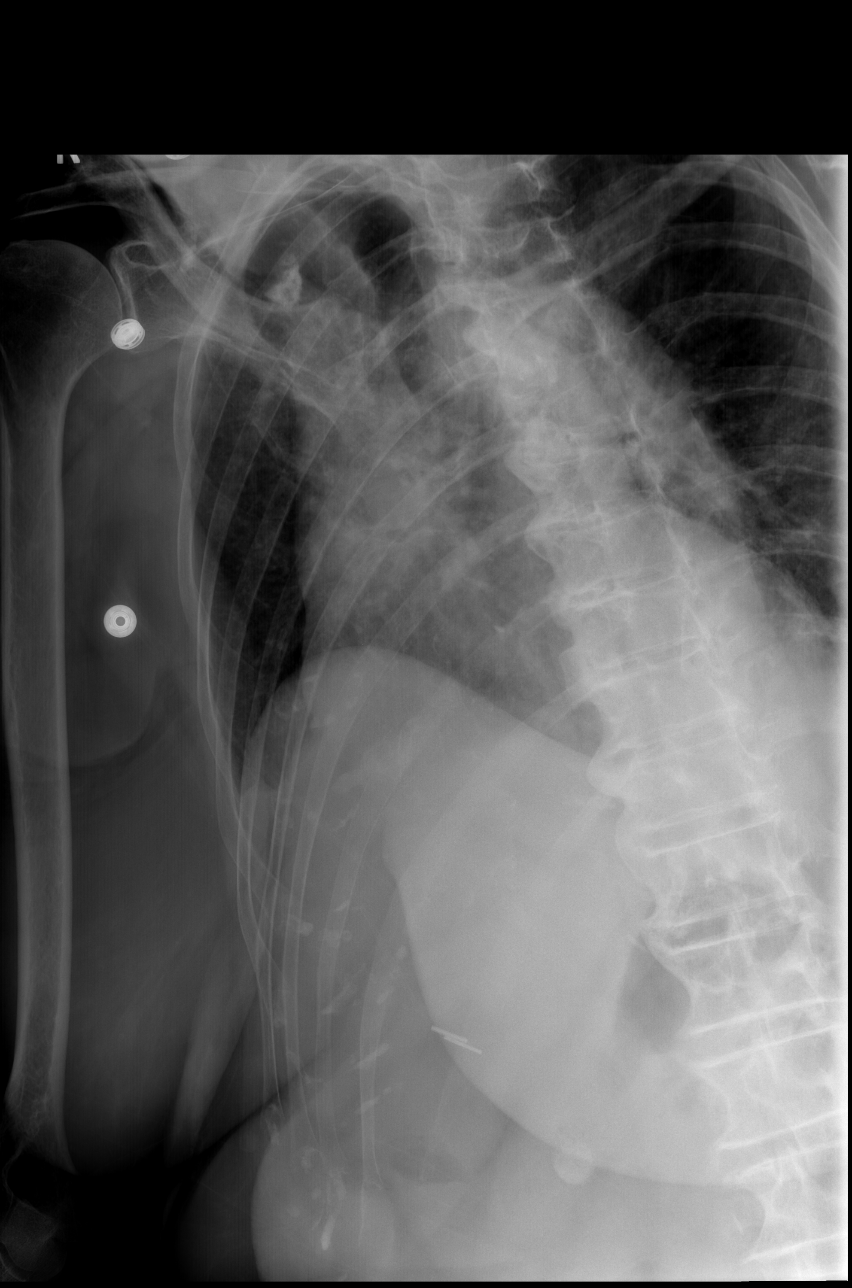

[x chest ap (3 of 4)]
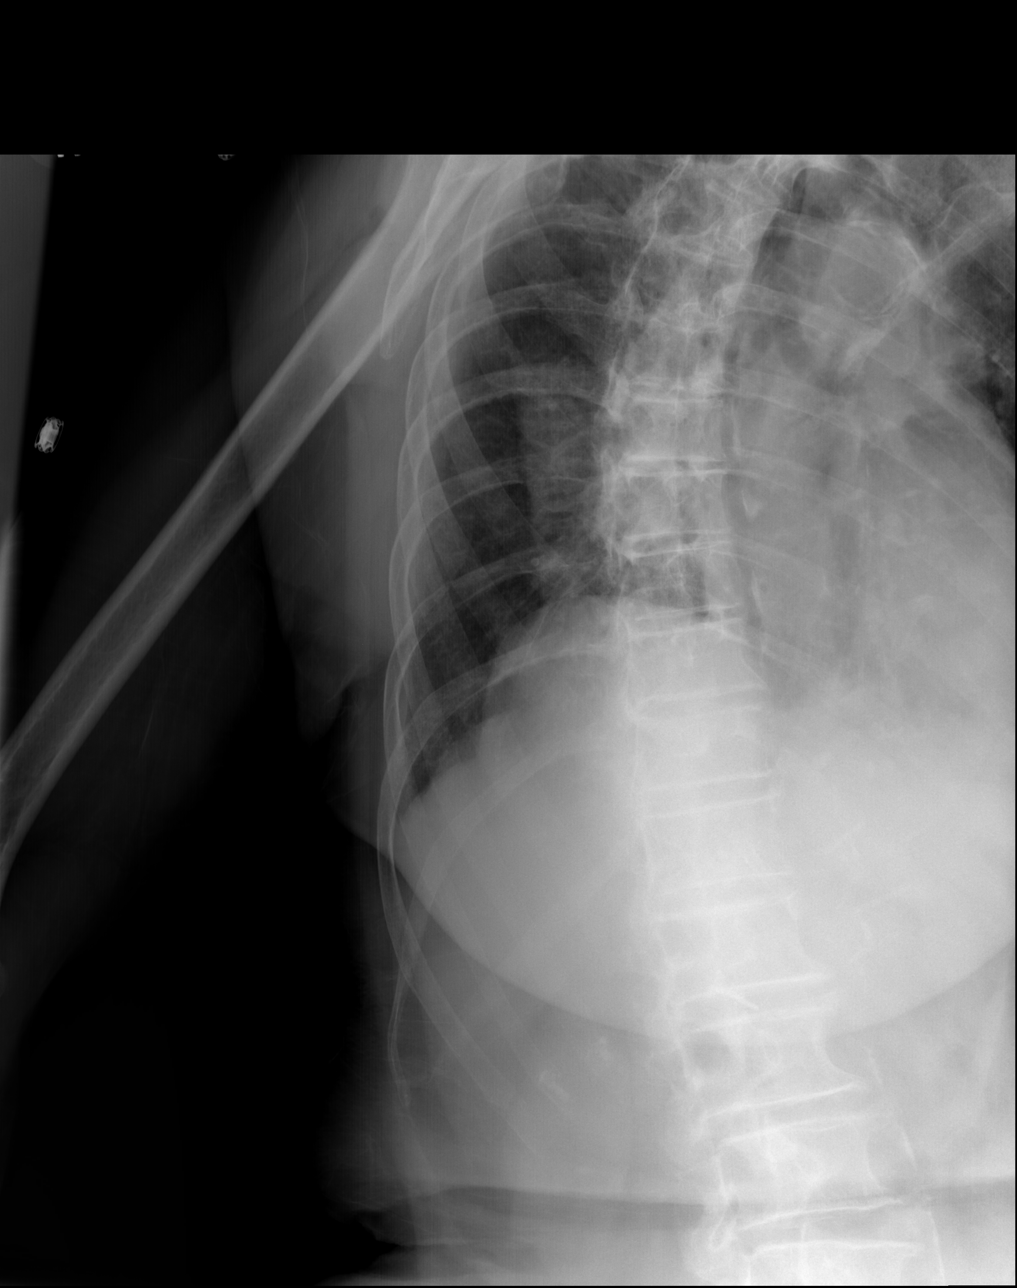

[x chest ap (4 of 4)]
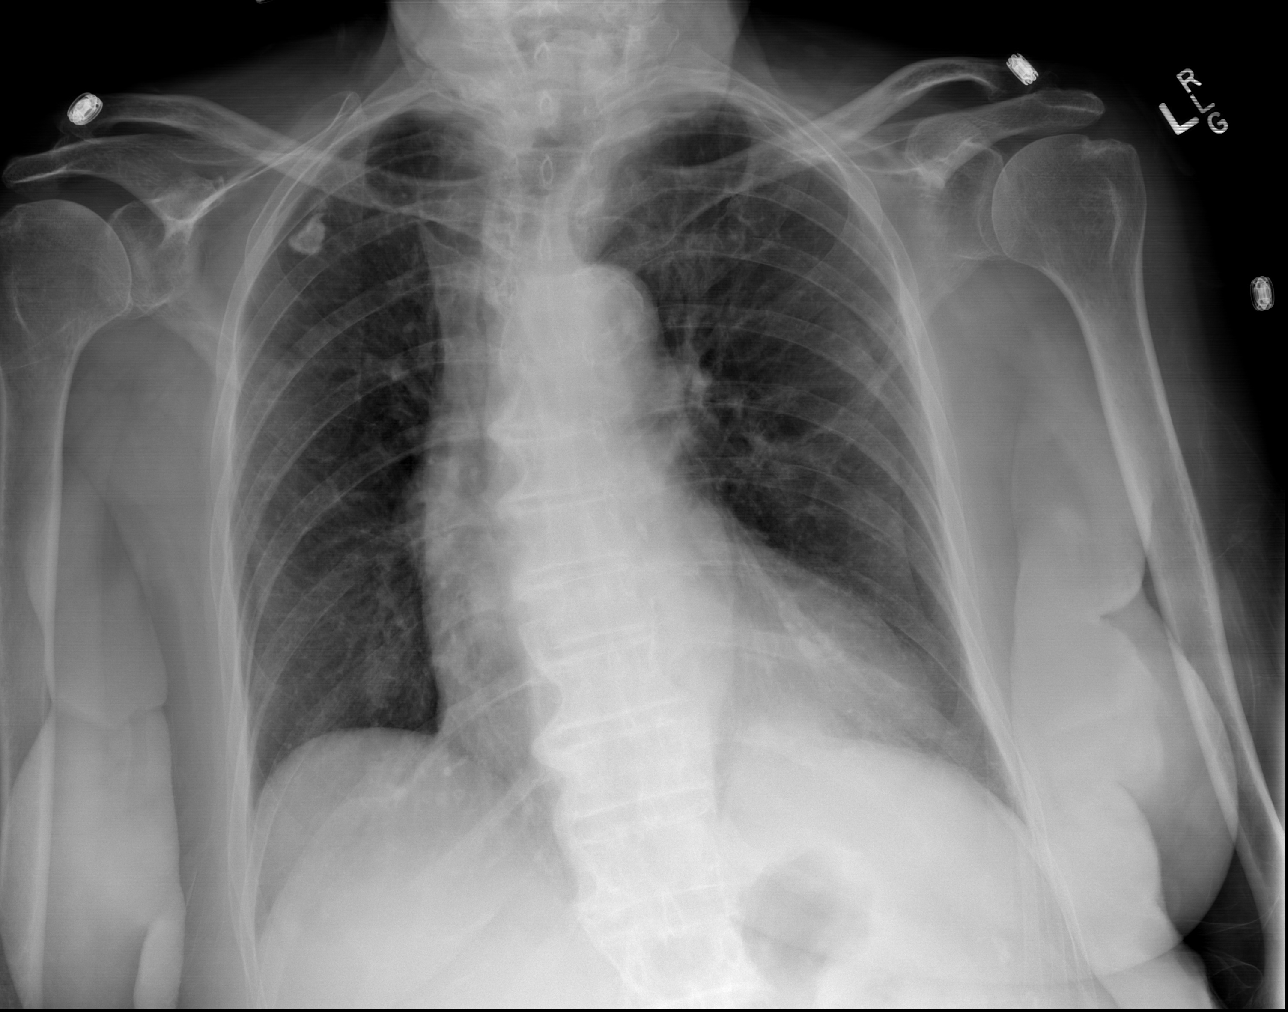

[4 of 4 positions shown; findings below may reference images not displayed]

FINDINGS: Four views right ribs submitted. No acute infiltrate or pulmonary
edema. Calcified granuloma in right upper lobe is stable. No right
rib fracture is identified. There is no pneumothorax.
Postcholecystectomy surgical clips are noted in right upper abdomen.
IMPRESSION: Negative.

## 2017-08-13 IMAGING — MR MR HEAD W/O CM
11 of 13 series · 34 of 48 positions shown · non-contrast
Comparison: Head CT, CTA, and CT perfusion without contrast 3030
hours today, and earlier.

CLINICAL DATA: [AGE] female with acute left leg weakness in
the middle of the night. Code stroke. Initial encounter.

EXAM:
MRI HEAD WITHOUT CONTRAST
TECHNIQUE: Multiplanar, multiecho pulse sequences of the brain and surrounding
structures were obtained without intravenous contrast.

[Series 4: DWI · axial · 3.0mm · 0.94mm/px · z∈[-162,-4]mm · 6 of 110 slices shown (1 of 2)]
[im 1/110]
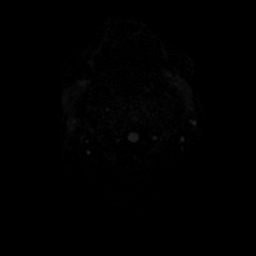
[im 22/110]
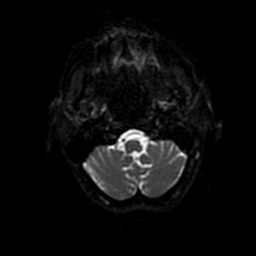
[im 44/110]
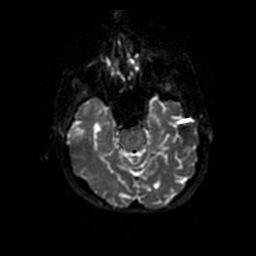
[im 66/110]
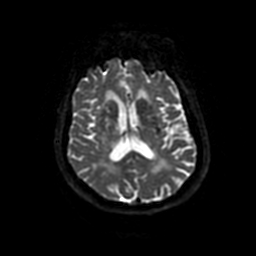
[im 88/110]
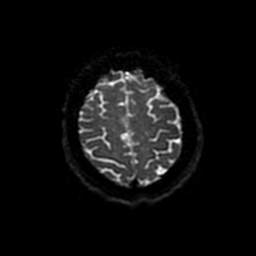
[im 110/110]
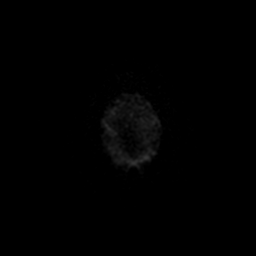

[Series 5: FLAIR · sagittal · 5.0mm · 0.47mm/px · 2 of 24 slices shown (1 of 2)]
[im 1/24]
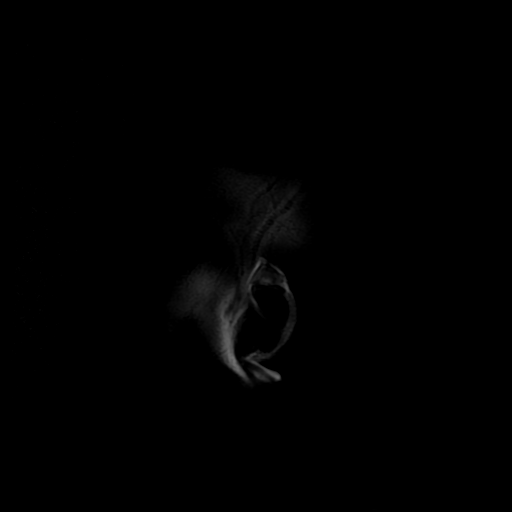
[im 24/24]
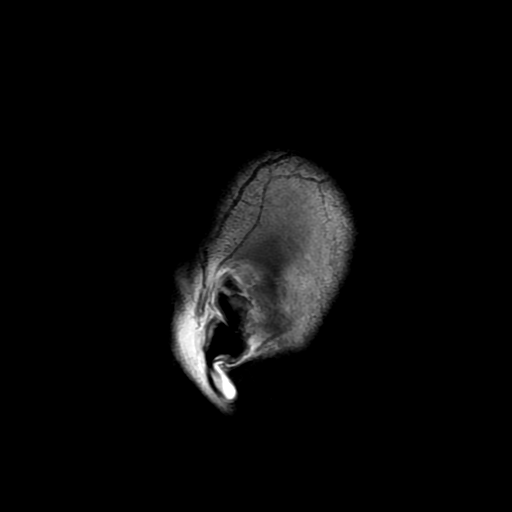

[Series 7: T2 · axial · 5.0mm · 0.47mm/px · z∈[-162,-4]mm · 2 of 28 slices shown]
[im 1/28]
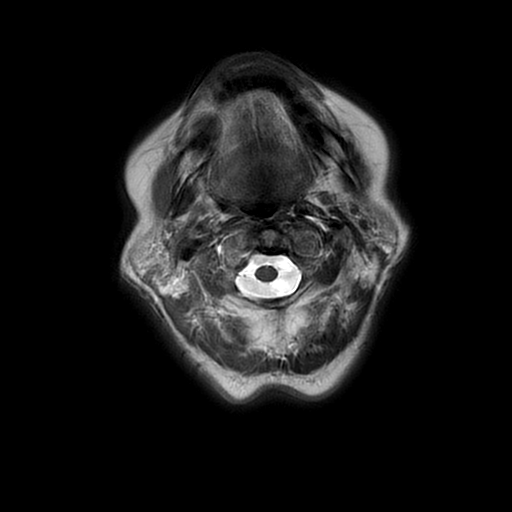
[im 28/28]
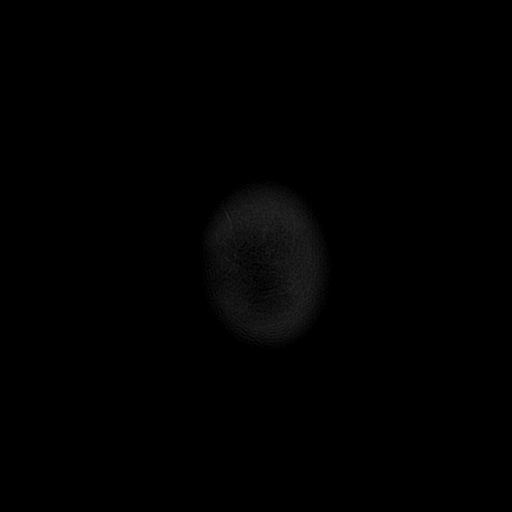

[Series 8: cor focus all · coronal · 3.0mm · 0.70mm/px · 3 of 48 slices shown]
[im 1/48]
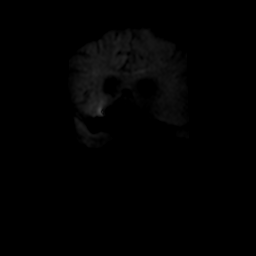
[im 24/48]
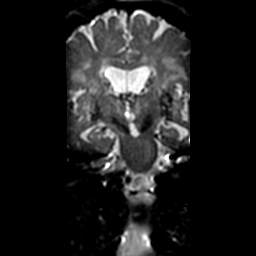
[im 48/48]
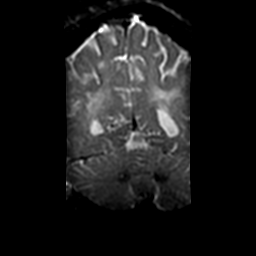

[Series 9: FLAIR · axial · 5.0mm · 0.47mm/px · z∈[-162,-4]mm · 2 of 28 slices shown (2 of 2)]
[im 1/28]
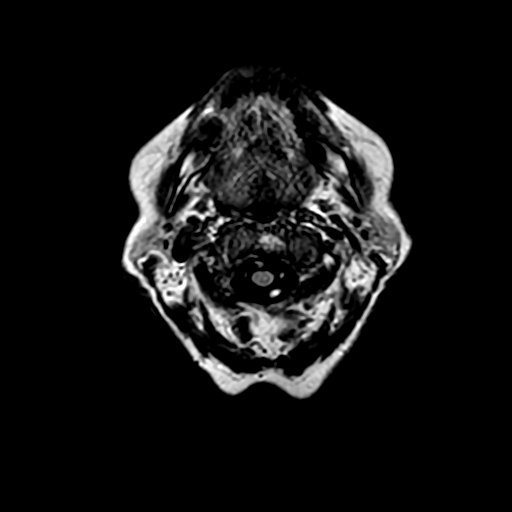
[im 28/28]
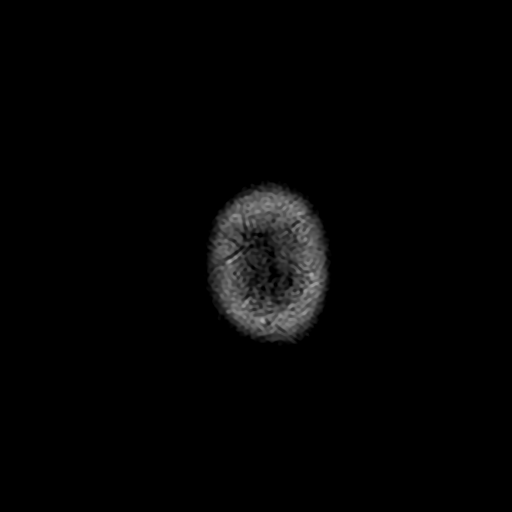

[Series 10: DWI · coronal · 4.0mm · 0.94mm/px · 5 of 72 slices shown (2 of 2)]
[im 1/72]
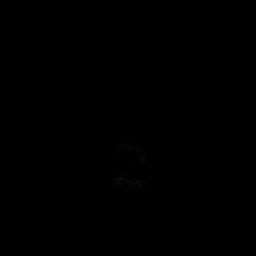
[im 18/72]
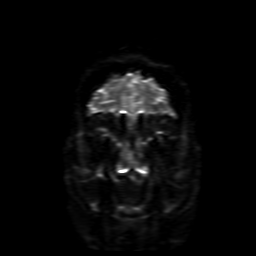
[im 36/72]
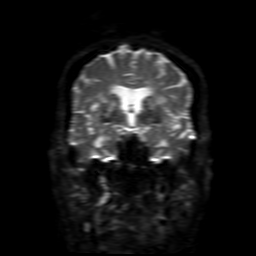
[im 54/72]
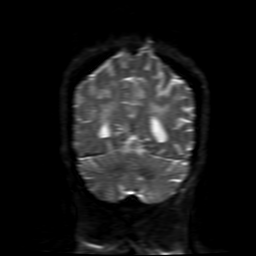
[im 72/72]
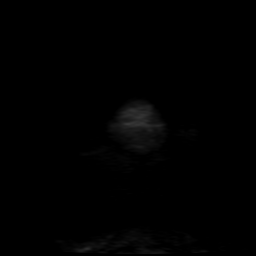

[Series 11: (person_name) · axial · 3.0mm · 0.47mm/px · z∈[-163,-83]mm · 4 of 112 slices shown]
[im 1/112]
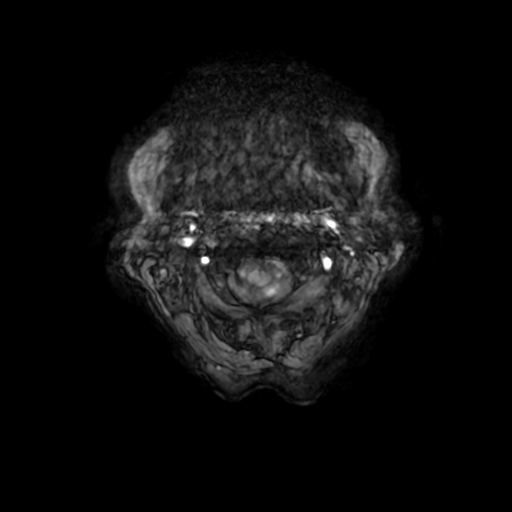
[im 19/112]
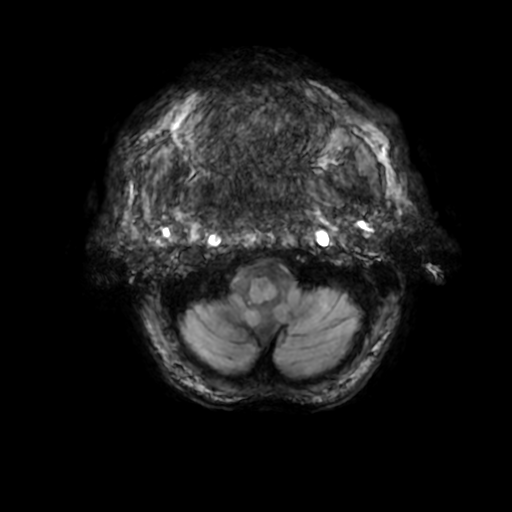
[im 38/112]
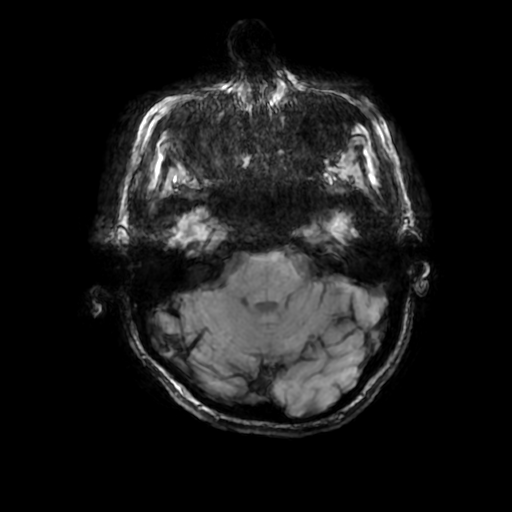
[im 56/112]
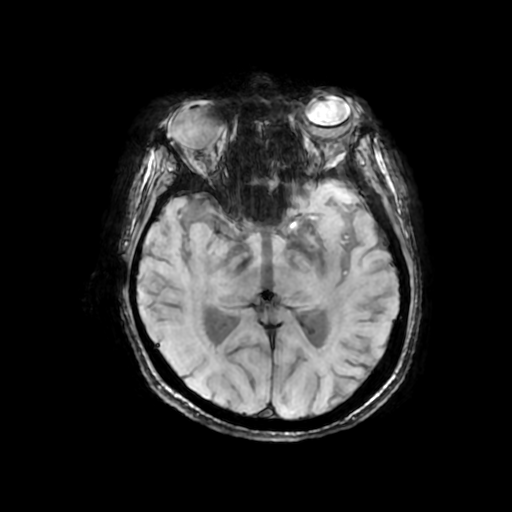

[Series 13: T2 post-contrast · coronal · 5.0mm · 0.39mm/px · 2 of 30 slices shown]
[im 1/30]
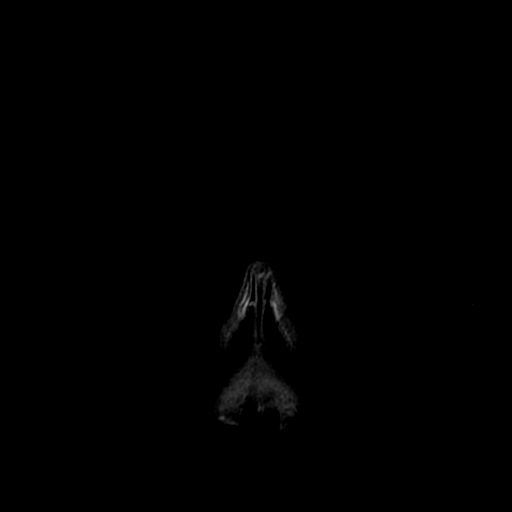
[im 30/30]
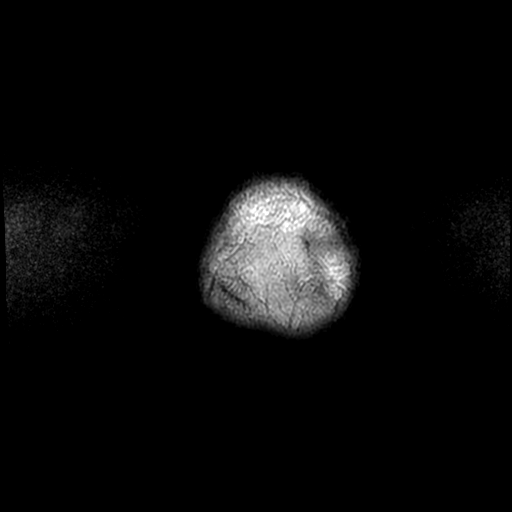

[Series 450: ADC · axial · 3.0mm · 0.94mm/px · z∈[-162,-4]mm · 4 of 55 slices shown (1 of 3)]
[im 1/55]
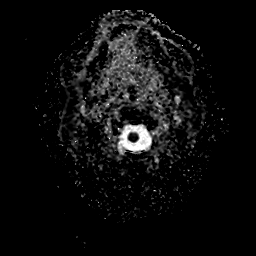
[im 19/55]
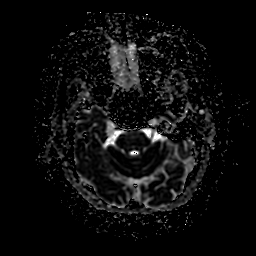
[im 37/55]
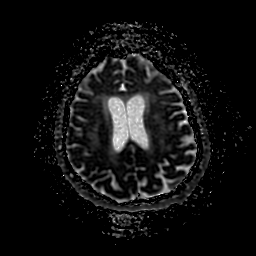
[im 55/55]
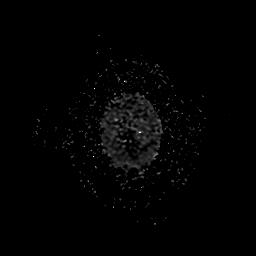

[Series 850: ADC · coronal · 3.0mm · 0.70mm/px · 2 of 23 slices shown (2 of 3)]
[im 1/23]
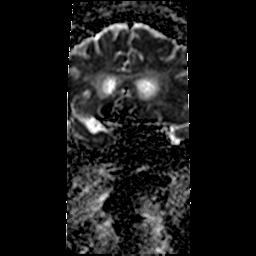
[im 23/23]
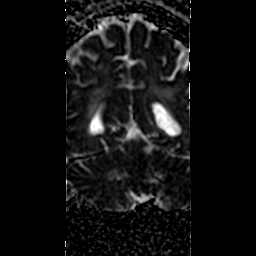

[Series 1050: ADC · coronal · 4.0mm · 0.94mm/px · 2 of 36 slices shown (3 of 3)]
[im 1/36]
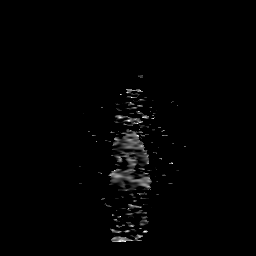
[im 36/36]
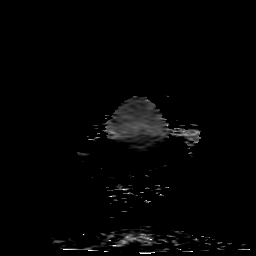

[34 of 48 positions shown; findings below may reference images not displayed]

FINDINGS: 15 mm area of confluent restricted diffusion tracking from the
posterior aspect of the right corona radiata toward the posterior
right lentiform nuclei (series 10, image 16). Mild associated T2 and
FLAIR hyperintensity. No associated hemorrhage or mass effect.

No other restricted diffusion. Major intracranial vascular flow
voids are preserved.

Widespread increased perivascular spaces in the deep gray matter
nuclei. Moderate T2 hyperintensity in the thalami compatible with
chronic small vessel ischemia worse on the left. Patchy and
confluent bilateral cerebral white matter T2 and FLAIR
hyperintensity. No cortical encephalomalacia identified. There are
occasional chronic micro hemorrhages in the brain, including at the
posterior limb of the left external capsule, in the left paracentral
pons, and in the left deep cerebellar nuclei. Minimal T2
heterogeneity in the pons.

No midline shift, mass effect, evidence of mass lesion,
ventriculomegaly, extra-axial collection or acute intracranial
hemorrhage. Cervicomedullary junction and pituitary are within
normal limits. Negative visualized cervical spine. Visualized bone
marrow signal is within normal limits. Visible internal auditory
structures appear normal. Trace mastoid fluid. Mild paranasal sinus
mucosal thickening. Postoperative changes to both globes. Otherwise
negative orbit and scalp soft tissues.
IMPRESSION: 1. Acute lacunar type infarct tracking from the posterior right
corona radiata to the posterior lentiform nuclei. No associated
hemorrhage or mass effect.
2. Underlying chronic small vessel disease, moderate for age.
